# Patient Record
Sex: Male | Born: 1940
Health system: Southern US, Community
[De-identification: ages and names within clinical notes are randomized; demographics above are authoritative.]

## PROBLEM LIST (undated history)

## (undated) DIAGNOSIS — K219 Gastro-esophageal reflux disease without esophagitis: Secondary | ICD-10-CM

## (undated) DIAGNOSIS — C4491 Basal cell carcinoma of skin, unspecified: Secondary | ICD-10-CM

## (undated) DIAGNOSIS — I671 Cerebral aneurysm, nonruptured: Secondary | ICD-10-CM

## (undated) DIAGNOSIS — I4892 Unspecified atrial flutter: Secondary | ICD-10-CM

## (undated) DIAGNOSIS — I714 Abdominal aortic aneurysm, without rupture, unspecified: Secondary | ICD-10-CM

## (undated) DIAGNOSIS — G459 Transient cerebral ischemic attack, unspecified: Secondary | ICD-10-CM

## (undated) DIAGNOSIS — I639 Cerebral infarction, unspecified: Secondary | ICD-10-CM

## (undated) DIAGNOSIS — I499 Cardiac arrhythmia, unspecified: Secondary | ICD-10-CM

## (undated) DIAGNOSIS — R931 Abnormal findings on diagnostic imaging of heart and coronary circulation: Principal | ICD-10-CM

## (undated) DIAGNOSIS — Z87898 Personal history of other specified conditions: Secondary | ICD-10-CM

## (undated) DIAGNOSIS — K5900 Constipation, unspecified: Secondary | ICD-10-CM

## (undated) DIAGNOSIS — F32A Depression, unspecified: Secondary | ICD-10-CM

## (undated) DIAGNOSIS — Z9289 Personal history of other medical treatment: Secondary | ICD-10-CM

## (undated) DIAGNOSIS — N400 Enlarged prostate without lower urinary tract symptoms: Secondary | ICD-10-CM

## (undated) DIAGNOSIS — F329 Major depressive disorder, single episode, unspecified: Secondary | ICD-10-CM

## (undated) DIAGNOSIS — R350 Frequency of micturition: Secondary | ICD-10-CM

## (undated) DIAGNOSIS — R911 Solitary pulmonary nodule: Secondary | ICD-10-CM

## (undated) DIAGNOSIS — I82409 Acute embolism and thrombosis of unspecified deep veins of unspecified lower extremity: Secondary | ICD-10-CM

## (undated) DIAGNOSIS — M069 Rheumatoid arthritis, unspecified: Secondary | ICD-10-CM

## (undated) DIAGNOSIS — N200 Calculus of kidney: Secondary | ICD-10-CM

## (undated) HISTORY — DX: Abdominal aortic aneurysm, without rupture, unspecified: I71.40

## (undated) HISTORY — PX: JOINT REPLACEMENT: SHX530

## (undated) HISTORY — PX: BASAL CELL CARCINOMA EXCISION: SHX1214

## (undated) HISTORY — DX: Unspecified atrial flutter: I48.92

## (undated) HISTORY — DX: Transient cerebral ischemic attack, unspecified: G45.9

## (undated) HISTORY — DX: Acute embolism and thrombosis of unspecified deep veins of unspecified lower extremity: I82.409

## (undated) HISTORY — DX: Abnormal findings on diagnostic imaging of heart and coronary circulation: R93.1

## (undated) HISTORY — DX: Abdominal aortic aneurysm, without rupture: I71.4

## (undated) HISTORY — DX: Solitary pulmonary nodule: R91.1

## (undated) HISTORY — PX: EXCISIONAL HEMORRHOIDECTOMY: SHX1541

## (undated) HISTORY — PX: CYSTOSCOPY W/ STONE MANIPULATION: SHX1427

---

## 1992-09-25 DIAGNOSIS — I639 Cerebral infarction, unspecified: Secondary | ICD-10-CM

## 1992-09-25 DIAGNOSIS — I671 Cerebral aneurysm, nonruptured: Secondary | ICD-10-CM

## 1992-09-25 HISTORY — DX: Cerebral infarction, unspecified: I63.9

## 1992-09-25 HISTORY — DX: Cerebral aneurysm, nonruptured: I67.1

## 1992-09-25 HISTORY — PX: CEREBRAL ANEURYSM REPAIR: SHX164

## 1998-12-23 ENCOUNTER — Encounter: Payer: Self-pay | Admitting: Neurological Surgery

## 1998-12-23 ENCOUNTER — Ambulatory Visit (HOSPITAL_COMMUNITY): Admission: RE | Admit: 1998-12-23 | Discharge: 1998-12-23 | Payer: Self-pay | Admitting: Neurological Surgery

## 1999-09-26 DIAGNOSIS — I82409 Acute embolism and thrombosis of unspecified deep veins of unspecified lower extremity: Secondary | ICD-10-CM

## 1999-09-26 HISTORY — DX: Acute embolism and thrombosis of unspecified deep veins of unspecified lower extremity: I82.409

## 1999-10-13 ENCOUNTER — Ambulatory Visit (HOSPITAL_COMMUNITY): Admission: RE | Admit: 1999-10-13 | Discharge: 1999-10-13 | Payer: Self-pay | Admitting: Rheumatology

## 1999-10-13 ENCOUNTER — Inpatient Hospital Stay (HOSPITAL_COMMUNITY): Admission: EM | Admit: 1999-10-13 | Discharge: 1999-10-14 | Payer: Self-pay | Admitting: Emergency Medicine

## 2000-11-12 ENCOUNTER — Ambulatory Visit (HOSPITAL_COMMUNITY): Admission: RE | Admit: 2000-11-12 | Discharge: 2000-11-12 | Payer: Self-pay | Admitting: Rheumatology

## 2003-05-06 ENCOUNTER — Encounter: Admission: RE | Admit: 2003-05-06 | Discharge: 2003-05-06 | Payer: Self-pay | Admitting: Neurological Surgery

## 2003-05-06 ENCOUNTER — Encounter: Payer: Self-pay | Admitting: Neurological Surgery

## 2003-12-07 ENCOUNTER — Encounter: Admission: RE | Admit: 2003-12-07 | Discharge: 2003-12-07 | Payer: Self-pay | Admitting: Rheumatology

## 2003-12-15 ENCOUNTER — Inpatient Hospital Stay (HOSPITAL_COMMUNITY): Admission: EM | Admit: 2003-12-15 | Discharge: 2003-12-17 | Payer: Self-pay | Admitting: Emergency Medicine

## 2007-10-08 ENCOUNTER — Encounter: Payer: Self-pay | Admitting: Pulmonary Disease

## 2007-11-26 ENCOUNTER — Ambulatory Visit: Payer: Self-pay | Admitting: Vascular Surgery

## 2008-01-01 ENCOUNTER — Observation Stay (HOSPITAL_COMMUNITY): Admission: EM | Admit: 2008-01-01 | Discharge: 2008-01-02 | Payer: Self-pay | Admitting: Emergency Medicine

## 2008-06-16 ENCOUNTER — Ambulatory Visit: Payer: Self-pay | Admitting: Vascular Surgery

## 2008-06-16 ENCOUNTER — Encounter: Admission: RE | Admit: 2008-06-16 | Discharge: 2008-06-16 | Payer: Self-pay | Admitting: Vascular Surgery

## 2008-06-19 ENCOUNTER — Encounter: Admission: RE | Admit: 2008-06-19 | Discharge: 2008-06-19 | Payer: Self-pay | Admitting: Vascular Surgery

## 2008-07-31 ENCOUNTER — Ambulatory Visit: Payer: Self-pay | Admitting: Pulmonary Disease

## 2008-07-31 DIAGNOSIS — Z87442 Personal history of urinary calculi: Secondary | ICD-10-CM | POA: Insufficient documentation

## 2008-07-31 DIAGNOSIS — J984 Other disorders of lung: Secondary | ICD-10-CM

## 2008-07-31 DIAGNOSIS — Z87898 Personal history of other specified conditions: Secondary | ICD-10-CM | POA: Insufficient documentation

## 2008-07-31 DIAGNOSIS — M069 Rheumatoid arthritis, unspecified: Secondary | ICD-10-CM | POA: Insufficient documentation

## 2008-07-31 DIAGNOSIS — Z85828 Personal history of other malignant neoplasm of skin: Secondary | ICD-10-CM | POA: Insufficient documentation

## 2008-07-31 DIAGNOSIS — I714 Abdominal aortic aneurysm, without rupture: Secondary | ICD-10-CM | POA: Insufficient documentation

## 2008-08-12 ENCOUNTER — Encounter: Payer: Self-pay | Admitting: Pulmonary Disease

## 2008-11-30 ENCOUNTER — Telehealth: Payer: Self-pay | Admitting: Pulmonary Disease

## 2009-01-06 ENCOUNTER — Ambulatory Visit: Payer: Self-pay | Admitting: Cardiovascular Disease

## 2009-06-10 ENCOUNTER — Ambulatory Visit: Payer: Self-pay | Admitting: Vascular Surgery

## 2009-12-28 ENCOUNTER — Telehealth: Payer: Self-pay | Admitting: Pulmonary Disease

## 2010-01-05 ENCOUNTER — Ambulatory Visit: Payer: Self-pay | Admitting: Internal Medicine

## 2010-01-10 ENCOUNTER — Telehealth (INDEPENDENT_AMBULATORY_CARE_PROVIDER_SITE_OTHER): Payer: Self-pay | Admitting: *Deleted

## 2010-07-06 ENCOUNTER — Ambulatory Visit: Payer: Self-pay | Admitting: Vascular Surgery

## 2010-10-25 NOTE — Progress Notes (Signed)
Summary: ct  Phone Note Call from Patient Call back at Home Phone [phone] Call back at [phone]   Caller: Patient Call For: clance Reason for Call: Talk to Nurse Summary of Call: pt says he was suppose to have another CT scan.  Nothing in computer.  Can we set this up before he comes back in? Initial call taken by: Eugene Gavia,  December 28, 2009 11:52 AM  Follow-up for Phone Call        [facility], looks like ct chest is due this month.  Do you want contrast or no? Please advise and we can send order to [facility] thanks Vernie Murders  December 28, 2009 3:20 PM   Additional Follow-up for Phone Call Additional follow up Details #1::        pt does need a f/u ct chest.  will send order to [facility]

## 2010-10-25 NOTE — Progress Notes (Signed)
Summary: RESULTS  Phone Note Call from Patient Call back at Home Phone [phone]   Caller: Patient Call For: [facility] Summary of Call: PT STATES HE IS STILL WAITING ON CT RESULTS. CALL HOME # OR CELL [phone] Initial call taken by: Tivis Ringer, CNA,  January 10, 2010 9:55 AM  Follow-up for Phone Call        Per [facility], nodules stable x2 years and no further follow-up is needed at this time. The pt had verbal understanding of this. Follow-up by: Michel Bickers CMA,  January 10, 2010 10:01 AM

## 2011-02-07 NOTE — Procedures (Signed)
DUPLEX ULTRASOUND OF ABDOMINAL AORTA   INDICATION:  Followup abdominal aortic aneurysm.   HISTORY:  Diabetes:  No.  Cardiac:  No.  Hypertension:  No.  Smoking:  No.  Connective Tissue Disorder:  Family History:  Possibly father.  Previous Surgery:  Brain aneurysm repair.   DUPLEX EXAM:         AP (cm)                   TRANSVERSE (cm)  Proximal             1.76 cm                   1.63 cm  Mid                  2.98 cm                   3.03 cm  Distal               1.81 cm                   1.95 cm  Right Iliac          1.37 cm                   1.40 cm  Left Iliac           1.42 cm                   1.32 cm   PREVIOUS:  Date:  06/16/2008  (CT)  AP:  2.8  TRANSVERSE:  3.0   IMPRESSION:  Stable abdominal aortic aneurysm with largest measurement  of 2.98 cm x 3.03 cm.   ___________________________________________  Di Kindle. Edilia Bo, M.D.   AS/MEDQ  D:  06/10/2009  T:  06/10/2009  Job:  [id number]

## 2011-02-07 NOTE — H&P (Signed)
Jeffrey Stephens, Jeffrey Stephens               ACCOUNT NO.:  [id number]   MEDICAL RECORD NO.:  [id number]          PATIENT TYPE:  EMS   LOCATION:  [facility]                         FACILITY:  [facility]   PHYSICIAN:  Hollice Espy, M.D.DATE OF BIRTH:  [birth year]   DATE OF ADMISSION:  01/01/2008  DATE OF DISCHARGE:                              HISTORY & PHYSICAL   PCP:  Dr. Lennox Pippins.   CHIEF COMPLAINTS:  Abdominal pain.   HISTORY OF PRESENT ILLNESS:  The patient is a 70 year old white male  with a past medical history of rheumatoid arthritis and recently  discovered AAA in the last 5 months who comes in complaining of  abdominal pain.  It started today located in the periumbilical region  and severe.  Describes a stabbing pain with no specific radiation and no  other complaints.  The pain was non-relenting, so he came into the  emergency room for further evaluation.  Initially the concern was  obviously that his AAA was worsening or had ruptured, so he went for a  stat CT which was negative for any change in his AAA.  However, it did  note continued diverticular disease and also is noted an area of diffuse  small bowel thickening concerning for possibility of ulceration versus  inflammation possible small bowel enteritis.  With these findings the  patient has borderline white count and the fact that his symptoms were  still persisting despite pain and nausea medicine, felt best to come in  for further evaluation and treatment.  Currently he is doing well.  Complains of mild pain, but better after pain medicine.  He denies any  headaches, vision changes, no dysphagia.  No chest pain, palpitations,  shortness of breath, wheeze, cough, no hematuria, dysuria, constipation,  diarrhea noted, no blood in stool.  No dark black or tarry stool.  No  focal strain, numbness, weakness, or pain.   REVIEW OF SYSTEMS:  Otherwise negative.   PAST MEDICAL HISTORY:  Includes rheumatoid arthritis, recently  discovered AAA.   MEDICATIONS:  On methotrexate and daily aspirin.  He has no known drug  allergies.   SOCIAL HISTORY:  No tobacco, alcohol, or drug use.   FAMILY HISTORY:  Noncontributory.   PHYSICAL EXAMINATION:  The patient's vitals on admission, temperature  96.7, heart 88, blood pressure 140/87, respirations 24, O2 sat 96% on  room air.  In general he is alert and x3 in no apparent distress.  HEENT: Normocephalic, atraumatic.  Mucous membranes are moist are moist.  He has no carotid bruits.  HEART:  Regular rate and rhythm S1-S2.  LUNGS:  Decreased breath sounds throughout secondary to pain and body  habitus.  ABDOMEN:  Soft, obese.  Some mild tenderness in periumbilical region.  Hypoactive bowel sounds.  EXTREMITIES:  No clubbing, cyanosis or edema.   LABS:  White count 10.1, H&H 16.6 and 40, MCV of 94, platelet count  288,000, 81% neutrophils.  Lipase is 29, INR is 1.  LFTs completely  normal.  Coags are unremarkable.  Lactic acid level is pending.  Sodium  138, potassium 4.2, chloride 102, bicarb  not done, BUN 13 creatinine  1.2, glucose 108.   ASSESSMENT AND PLAN:  1. Abdominal pain.  Questionable area of small bowel thickening,      inflammation versus ulcer versus previously gastroenteritis.      Observe overnight with pain and nausea control.  2. History of rheumatoid arthritis.  Continue methotrexate.  3. History of abdominal aortic aneurysm.  Monitor blood pressure.      Hollice Espy, M.D.  Electronically Signed     SKK/MEDQ  D:  01/01/2008  T:  01/01/2008  Job:  [id number]   cc:   Demetria Pore. Coral Spikes, M.D.

## 2011-02-07 NOTE — Consult Note (Signed)
VASCULAR SURGERY CONSULTATION   WADDELL, ITEN  DOB:  1941/05/01                                       11/26/2007  [id number]#:[id number]   I saw Mr. Rocks in the office today in consultation concerning a 3.2  cm infrarenal abdominal aortic aneurysm.  He was referred by Dr.  Coral Spikes.  This is a pleasant 70 year old gentleman who had developed  some right flank pain approximately 2 months ago.  Initially it was felt  that he might have a kidney stone.  This ultimately prompted a CT scan  which did not show evidence of a kidney stone but an incidental finding  was a 3.2 cm infrarenal abdominal aortic aneurysm.  He does have  bilateral renal cysts.  He was also noted to have some slight prostatic  enlargement.  Based on the CT finding, he was sent for vascular  consultation.   PAST MEDICAL HISTORY:  Significant for:  1. History of a ruptured brain aneurysm in 1994 which was treated by      Dr. Danielle Dess.  2. He denies any history of diabetes, hypertension,      hypercholesterolemia, history of previous myocardial infarction,      history of congestive heart failure or history of emphysema.  3. He does have a history of a left leg DVT in 2001 and was treated at      that time with Coumadin.   PAST SURGICAL HISTORY:  Significant for:  1. Surgery on some skin cancers on his face by Dr. Yetta Barre in 2001.  2. He has had previous hemorrhoidectomy.  3. He has had previous kidney stone cystoscopy.  4. 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   SOCIAL HISTORY:  1. He is married, he has 3 children.  2. He quit tobacco in 1994.   FAMILY HISTORY:  1. He has a brother who had heart disease in his early 75s.  2. His father had a aneurysm around his heart.  3. He is unaware of any other history of premature cardiovascular      disease.   MEDICATIONS AND REVIEW OF SYSTEMS:  Are documented  on the medical  history form in his chart.   PHYSICAL EXAMINATION:  This is a pleasant 70 year old gentleman who  appears his stated age.  Blood pressure is 135/80, heart rate is 77.  There is no cervical lymphadenopathy.  I do not detect any carotid  bruits.  Lungs:  Clear bilaterally to auscultation.  Cardiac Exam:  He  has a regular rate and rhythm.  Abdomen:  Soft and nontender.  His  abdomen is fairly large and I cannot palpate his aneurysm.  He has  normal pitched bowel sounds.  He has normal femoral, popliteal and pedal  pulses.  He has no evidence of evidence of atheroembolic disease.  He  has no significant lower extremity swelling.  Neurologic Exam:  Nonfocal.   [facility] and I am unable to retrieve  this on the computer.  I do have the report and it was interpreted by  Dr. Liliane Shi.  The maximum diameter of his aneurysm is 3.2 cm.  There was  no evidence of leak.   I reassured Mr. Goldston that he  has a very small aneurysm.  He is fairly  concerned about it and rather than extend and follow this up in a year I  will see him back in 6 months.  He understands we would generally not  consider elective repair for the normal risk patient unless it reached  5.5 cm in maximum diameter.  His flank pain sounds like it is most  likely musculoskeletal in origin.  I did not think it was related to his  aneurysm.  I will see him back in 6 months.  He knows to call sooner if  he has problems.   Di Kindle. Edilia Bo, M.D.  Electronically Signed  CSD/MEDQ  D:  11/26/2007  T:  11/27/2007  Job:  [id number]   cc:   Demetria Pore. Coral Spikes, M.D.

## 2011-02-07 NOTE — Assessment & Plan Note (Signed)
OFFICE VISIT   Jeffrey Stephens, Jeffrey Stephens  DOB:  November 23, 1940                                       06/10/2009  [id number]#:[id number]   I saw the patient in the office today for continued followup of his  small abdominal aortic aneurysm.  When I last saw him in September of  2009 by my measurement and CT scan the aneurysm was 2.7 cm in maximum  diameter.  He comes in for a yearly followup visit.  Of note, he also  had some small lung nodules which Dr. Coral Spikes has followed.   With respect to his aneurysm he has had no abdominal or back pain except  for some chronic low back pain related to arthritis.   There has been no significant change in his medical history since his  last visit.   REVIEW OF SYSTEMS:  He has had no chest pain, chest pressure,  palpitations or arrhythmias.  He has had no productive cough,  bronchitis, asthma or wheezing.   PHYSICAL EXAMINATION:  This is a pleasant 70 year old gentleman who  appears his stated age.  His blood pressure is 119/76, heart rate is 61.  I do not detect any carotid bruits.  Lungs are clear bilaterally to  auscultation.  On cardiac exam he has a regular rate and rhythm.  His  abdomen is soft and nontender.  He has palpable femoral pulses and  palpable pedal pulses bilaterally.  He has no evidence of atheroembolic  disease.   Ultrasound in our office today shows that the maximum diameter of his  aneurysm is 3.03 cm.   There has been no significant change in the size of his aneurysm over  the last year.  He understands we would normally not consider elective  repair in a normal risk patient unless the aneurysm reached 5.5 cm in  maximum diameter.  He is obviously a good ways away from that.  In  addition the aneurysm has remained stable in size.  We will continue his  followup at 1 year and I will see him back in 1 year with a followup  ultrasound.  He knows to call sooner if he has problems.   Di Kindle. Edilia Bo,  M.D.  Electronically Signed   CSD/MEDQ  D:  06/10/2009  T:  06/11/2009  Job:  [id number]

## 2011-02-07 NOTE — Assessment & Plan Note (Signed)
OFFICE VISIT   Jeffrey Stephens, Jeffrey Stephens  DOB:  08/26/1941                                       07/06/2010  OZHYQ#:[id number]   I saw patient in the office today for continued follow-up of his small  abdominal aortic aneurysm.  I last saw him in September 2010.  At that  time the aneurysm measured 3 cm in maximum diameter.  He comes in for a  yearly follow-up visit.  Since I saw him last, he has had no significant  abdominal or back pain.   His past medical history is fairly unremarkable except for some  arthritis and a previous history of an intracranial aneurysm.   SOCIAL HISTORY:  He is married.  He quit tobacco in 1994.  His wife  recently quit tobacco also.   REVIEW OF SYSTEMS:  CARDIOVASCULAR:  He has had no chest pain, chest  pressure, palpitations, or arrhythmias.  He has some mild dyspnea on  exertion.  He has had no history of DVT or phlebitis.  PULMONARY:  He has had no productive cough, bronchitis, asthma, or  wheezing.   PHYSICAL EXAMINATION:  This is a pleasant 70 year old gentleman who  appears his stated age.  Blood pressure is 116/73, heart rate is 72,  saturation 98%.  Lungs are clear bilaterally to auscultation without  rales, rhonchi, or wheezing.  On cardiovascular exam, I do not detect  any carotid bruits.  He has a regular rate and rhythm.  He has palpable  femoral, popliteal, dorsalis pedis, and posterior tibial pulses  bilaterally.  He has no significant lower extremity swelling.  Abdomen  is soft and nontender.  Because of his size, I cannot palpate his  aneurysm.  He has normal-pitched bowel sounds.  Neurologic:  He has no  focal weakness or paresthesias.   I did independently interpret his duplex of his aorta, which shows a  maximum diameter of 3 cm and has not changed over the last year.   We discussed potentially stretching his follow-up out to 18 months.  However, given the problems he had with an intracranial aneurysm, he is  very reluctant to do this and therefore will keep his follow-up at 1  year.  I have ordered an ultrasound of the aneurysm in 1 year and will  see him back at that time.  He knows to call sooner if he has problems.     Di Kindle. Edilia Bo, M.D.  Electronically Signed   CSD/MEDQ  D:  07/06/2010  T:  07/07/2010  Job:  [id number]   cc:   Thora Lance, M.D.

## 2011-02-07 NOTE — Procedures (Signed)
DUPLEX ULTRASOUND OF ABDOMINAL AORTA   INDICATION:  Follow up AAA.   HISTORY:  Diabetes:  No.  Cardiac:  No.  Hypertension:  No.  Smoking:  No.  Connective Tissue Disorder:  Family History:  Possibly father.  Previous Surgery:  Brain aneurysm repair.   DUPLEX EXAM:         AP (cm)                   TRANSVERSE (cm)  Proximal             1.84 cm                   2.26 cm  Mid                  2.88 cm                   3.00 cm  Distal               1.84 cm                   1.93 cm  Right Iliac          1.23 cm  Left Iliac           1.27 cm   PREVIOUS:  Date: 06/10/2009  AP:  2.98  TRANSVERSE:  3.03   IMPRESSION:  1. There is an increase in the proximal aorta measurement, as compared      to the previous study.  2. Mid and distal aorta measurements remain stable from previous      study.  3. Largest measurement today, 2.88 cm X 3.00 cm.   ___________________________________________  Di Kindle. Edilia Bo, M.D.   EM/MEDQ  D:  07/06/2010  T:  07/06/2010  Job:  [id number]

## 2011-02-07 NOTE — Assessment & Plan Note (Signed)
OFFICE VISIT   KOVEN, BELINSKY  DOB:  November 16, 1940                                       06/16/2008  UYQIH#:[id number]   I saw the patient in the office today for continued followup of his  small infrarenal abdominal aortic aneurysm.  I had originally seen him  in consultation in March of this year with a 3.2 cm infrarenal abdominal  aortic aneurysm.  Since I saw him last in March he has had no history of  significant abdominal or back pain.   PAST MEDICAL HISTORY:  Significant for a ruptured brain aneurysm in  1994.  He has also had a previous left leg DVT in 2001.  He denies any  history of diabetes, hypertension, hypercholesterolemia or history of  significant cardiac disease.   SOCIAL HISTORY:  He quit tobacco in 1994.   REVIEW OF SYSTEMS:  He has had no recent chest pain, chest pressure,  palpitations or arrhythmias.  He has had no bronchitis, asthma, wheezing  or hemoptysis.  He has occasional nocturia.   PHYSICAL EXAMINATION:  General:  This is a pleasant 70 year old  gentleman who appears his stated age.  Vital signs:  Blood pressure  130/75, heart rate is 80.  Neck:  I do not detect any carotid bruits.  Lungs:  Are clear bilaterally to auscultation.  Cardiac:  He has a  regular rate and rhythm.  Abdomen:  I cannot palpate his aneurysm  because of his size.  However, his abdomen is nontender and he has  normal pitched bowel sounds.  He has palpable femoral, popliteal and  pedal pulses.  I see no evidence of atheroembolic disease.   He did have a CT scan of the chest, abdomen and pelvis.  The patient was  noted to have some persistent nodules in the right lower lobe.  Some of  the nodules had actually decreased in size and this would be consistent  with inflammatory nodules which were resolving.  However, a followup CT  scan was recommended as this CT was somewhat limited they had  recommended a dedicated chest CT or PET scan.  Of note, the  abdominal  aortic aneurysm had not changed in size and by my measurement was about  2.7 cm in maximum diameter.   With respect to his aneurysm I think this is quite small and I think he  simply needs a followup ultrasound in 1 year.  We will be sure that Dr.  Katina Degree office gets a copy of the CT scan report and when he sees Dr.  Coral Spikes next it would probably be best for him to arrange his followup  of his chest nodules as I explained that I am not a thoracic surgeon.  However, I also explained that if it would be easier for the patient we  would be happy to schedule the dedicated chest CT.  The patient will  think about this and let us know what he decides to do.  Otherwise, I  will plan on seeing him back in 1 year with a followup ultrasound of his  abdominal aortic aneurysm.  He understands we generally would not  consider elective repair unless it reached 5.5 cm in maximum diameter.   Di Kindle. Edilia Bo, M.D.  Electronically Signed   CSD/MEDQ  D:  06/23/2008  T:  06/24/2008  Job:  [id number] 
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
## 2011-02-10 NOTE — Discharge Summary (Signed)
Jeffrey Stephens, Jeffrey Stephens                         ACCOUNT NO.:  [id number]   MEDICAL RECORD NO.:  [id number]                   PATIENT TYPE:  INP   LOCATION:  5031                                 FACILITY:  [facility]   PHYSICIAN:  Lilla Shook, M.D.            DATE OF BIRTH:  11-01-1940   DATE OF ADMISSION:  12/15/2003  DATE OF DISCHARGE:  12/17/2003                                 DISCHARGE SUMMARY   ADMISSION DIAGNOSES:  1. Subacute febrile illness with suspicion of influenza.  2. Rheumatoid arthritis.   DISCHARGE DIAGNOSES:  1. Noninfluenza, probable viral syndrome.  2. Bronchitis.  3. Rheumatoid arthritis.  4. Chronic steroid dependence and also on methotrexate which was held three     days ago.   DISCHARGE MEDICATIONS:  1. Prednisone taper as follows:  Prednisone 60 mg daily for two days, then     50 mg daily for 2 days, then 40 mg daily for two days, then 30 mg daily     for two days, then 20 mg daily for two days, then  10 mg daily for two days, then 5 mg daily thereafter.  1. Levaquin 500 mg daily times 7 days.  2. Methotrexate 10 mg q Monday as before.  3. Humibid LA 600 mg q 12 hours for cough p.r.n.  4. Folic acid 1 mg daily.  5. Aspirin 81 mg daily.   DISCHARGE INSTRUCTIONS:  He was to follow up with Dr. Coral Spikes one week after  discharge.  He would follow a low fat diet and call or come to the emergency  room with more shortness of breath, fevers, chills, etc.   CHIEF COMPLAINT:  Headache, fatigue and runny nose.   HISTORY OF PRESENT ILLNESS:  Jeffrey Stephens is a pleasant 70 year old man who  is a patient of Dr. Lennox Pippins.  He is seen for rheumatoid arthritis.  Normally, he was in great health until 3 days prior to admission.  He  developed rhinorrhea, headache and fatigue.  He was told not to take his  methotrexate this past Monday to help with his symptoms.  He then developed  pleuritic left anterior chest wall pain which was burning in nature with  initially a dry cough.  He had a fever of 102 at the highest.  Decrease in  appetite.  No dysuria or other complaints.   PHYSICAL EXAMINATION:  VITAL SIGNS: Temperature maximum is 101.6, blood  pressure 138/72, pulse oximetry 94% on oxygen.  LUNGS: Rales at the left base.  HEART: He had distant heart sounds but normal S1 and S2.  GU:  His prostate was felt to be boggy and painful.   LABORATORY DATA:  He had an EKG with a left posterior fascicular block.  Chest x-ray shows left lower lobe atelectasis. White blood cell count  10,200, hemoglobin 17.1, neutrophils 87%.  Urine and blood cultures are  pending.   HOSPITAL COURSE:  Mr. Magallon  was admitted for an acute febrile illness  which may have been the flu.  He did have basilar aspirate for influenzae  which were negative.  He also was treated for prostatitis given the boggy  and painful perception of his prostate on admission.  However, urine  cultures and urinalysis were both negative.  Mr. Harshberger, himself, also  stated that he had had no problems regarding his prostate and perhaps only  had a slow urinary flow.  He was then switched to Levaquin from  ciprofloxacin for therapy. He had a repeat chest x-ray the morning after  admission which showed improvement in his left lower lobe atelectasis.  This  probably was fluid and stress of the steroids. He was given  Solu-Cortef at 100 mg t.i.d.  stress dose for occasional chronic prednisone  therapy.  He began to cough up greenish thick sputum and was given a cough  expectorant.  His appetite returned and he was eating regular food by the  time of discharge and ambulated around the room and generally was doing  better.  The plan was to taper his steroids as an outpatient slowly and  continue the antibiotics and cough medicines.   CONDITION ON DISCHARGE:  Stable and much improved.   CONSULTATIONS:  None.                                                Lilla Shook, M.D.     SEJ/MEDQ  D:  12/17/2003  T:  12/19/2003  Job:  [id number]   cc:   Demetria Pore. Coral Spikes, M.D.  [address]  [address]  [place]  [place] [address]  Fax: [phone]

## 2011-02-10 NOTE — H&P (Signed)
NAME:  Jeffrey Stephens, HIGINBOTHAM                         ACCOUNT NO.:  [id number]   MEDICAL RECORD NO.:  [id number]                   PATIENT TYPE:  INP   LOCATION:  1831                                 FACILITY:  [facility]   PHYSICIAN:  Hal T. Stoneking, M.D.              DATE OF BIRTH:  1941/09/12   DATE OF ADMISSION:  12/15/2003  DATE OF DISCHARGE:                                HISTORY & PHYSICAL   IDENTIFYING DATA:  Mr. Jeffrey Stephens is a very nice 70 year old white male  followed by Dr. Demetria Pore. Levitin for rheumatoid arthritis.  He basically is  an independent person who enjoys excellent health.  Approximately three days  ago, he developed rhinorrhea, headache and fatigue.  He was told yesterday  not to take his methotrexate.  Then today, he developed pleuritic left  anterior chest wall pain that was burning in nature.  He really denied any  cough.  He also noted a fever today as high as 102 with chills.  He has had  really no appetite and has had some dry  heaves but no true vomiting or  diarrhea.  He does complain of headache.  He denies any dysuria and  complains of generalized myalgias.   ALLERGIES:  No known drug allergies.   CURRENT MEDICATIONS:  1. Methotrexate 10 mg every Monday.  2. Prednisone 5 mg a day.  3. Folic acid 1 mg a day.  4. Aspirin 81 mg a day.   PAST MEDICAL HISTORY:  1. Rheumatoid arthritis.  2. Status post stroke due to a posterior aneurysm rupture in 1994.  No     residual neurologic deficit.  3. No history of diabetes, hypertension, or coronary artery disease.   PAST SURGICAL HISTORY:  1. Craniotomy and clipping of an aneurysm in 1994.  2. Hemorrhoid surgery.   FAMILY HISTORY:  This is remarkable for coronary artery disease in his  father, brother and mother.   SOCIAL HISTORY:  He is married.  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  He stopped smoking in 1994, does not  consume alcohol, has three grown children.   REVIEW OF SYMPTOMS:  Unremarkable other than that mentioned in the HPI.   PHYSICAL EXAMINATION:  VITAL SIGNS:  Temperature 101.6, blood pressure  138/72, O2 saturation 94%.  SKIN:  Normal.  HEENT:  Pupils are equal, round and reactive to light.  Discs are sharp.  TMs normal.  Oropharynx clear.  LUNGS:  There are a few crackles at the left base.  CARDIOVASCULAR:  There is distant S1 and S2.  No murmur heard.  ABDOMEN:  Obese, soft, nontender.  RECTAL:  Examination revealed a very tender, boggy prostate.  NEUROLOGIC:  Normal.   EKG revealed a left posterior fascicular block.   Chest x-ray revealed left lower lobe atelectasis.   LABORATORY DATA:  White count of 10,200, hemoglobin 17.1, platelet count  211,000, 87%  neutrophils, 6% lymphs, 7% monos.  Sodium 135, potassium 4.1,  chloride 104, bicarb 22, glucose 116, BUN 7, creatinine 1.0.  Calcium 9.0,  total protein 7.5, albumin 3.9, SGOT 22, SGPT 21, alkaline phosphatase 69,  total bilirubin 0.8.  CK 31 with an MB of 0.6 troponin 0.02.   ASSESSMENT:  1. Subacute febrile illness with myalgias, headache, and rhinorrhea.     Clinical presentation most consistent with influenza, although he does     state he had a flu shot this year.  Also, another viral process is a     possibility.  Also, his prostate is very tender.  I wonder about     prostatitis.  Another possibility would be a very early pneumonia,     although he is really not coughing very much.  He does have pleuritic     chest pain.  2. Rheumatoid arthritis.  He did not take his methotrexate this past Monday.     It has been over a week since he has had it and I doubt this is related     to his current process.  He is somewhat adrenally suppressed secondary to     his chronic prednisone use.   PLAN:  Will admit.  Will check a urinalysis.  Will swab for influenza.  If  the above suggests prostatitis, then will start Cipro.  He will need repeat  PA and lateral chest x-ray in the  morning to see if a pneumonia is  developing.  Will cover him with stress Solu-Cortef level and will treat him  with Toradol for the chest discomfort.                                                Hal T. Pete Glatter, M.D.    HTS/MEDQ  D:  12/15/2003  T:  12/16/2003  Job:  [id number]

## 2011-06-20 LAB — CROSSMATCH: ABO/RH(D): B POS

## 2011-06-20 LAB — DIFFERENTIAL
Basophils Relative: 0
Basophils Relative: 0
Eosinophils Absolute: 0.4
Eosinophils Relative: 5
Lymphocytes Relative: 10 — ABNORMAL LOW
Lymphocytes Relative: 17
Lymphs Abs: 1.5
Monocytes Absolute: 0.6
Monocytes Absolute: 0.7
Monocytes Relative: 6
Monocytes Relative: 8
Neutro Abs: 6.2
Neutrophils Relative %: 70

## 2011-06-20 LAB — POCT CARDIAC MARKERS
CKMB, poc: <1 — ABNORMAL LOW
Myoglobin, poc: 72.2
Myoglobin, poc: 89
Operator id: 294521
Operator id: 294521
Troponin i, poc: <0.05
Troponin i, poc: <0.05

## 2011-06-20 LAB — POCT I-STAT, CHEM 8
BUN: 13
Calcium, Ion: 1.21
Chloride: 102
HCT: 50
Hemoglobin: 17
TCO2: 28

## 2011-06-20 LAB — HEPATIC FUNCTION PANEL
ALT: 14
AST: 22
Alkaline Phosphatase: 64
Bilirubin, Direct: 0.1
Total Bilirubin: 0.7

## 2011-06-20 LAB — CBC
Hemoglobin: 14
MCV: 93.5
Platelets: 288
RDW: 13.5

## 2011-06-20 LAB — PROTIME-INR: INR: 1

## 2011-06-20 LAB — LIPASE, BLOOD: Lipase: 29

## 2011-06-20 LAB — APTT: aPTT: 34

## 2011-07-05 ENCOUNTER — Encounter (INDEPENDENT_AMBULATORY_CARE_PROVIDER_SITE_OTHER): Payer: Medicare Other | Admitting: *Deleted

## 2011-07-05 DIAGNOSIS — I714 Abdominal aortic aneurysm, without rupture, unspecified: Secondary | ICD-10-CM

## 2011-07-12 ENCOUNTER — Encounter: Payer: Self-pay | Admitting: Vascular Surgery

## 2011-07-12 NOTE — Procedures (Unsigned)
DUPLEX ULTRASOUND OF ABDOMINAL AORTA  INDICATION:  Follow up AAA.  HISTORY: Diabetes:  No. Cardiac:  No. Hypertension:  No. Smoking:  No. Connective Tissue Disorder: Family History: Previous Surgery:  Brain aneurysm repair.  DUPLEX EXAM:         AP (cm)                   TRANSVERSE (cm) Proximal             2.06 cm Mid                  3.02 cm                   2.96 cm Distal               1.68 cm                   1.46 cm Right Iliac          1.04 cm Left Iliac           0.95 cm  PREVIOUS:  Date: 07/06/10  AP:  2.88  TRANSVERSE:  3.0  IMPRESSION:  Stable abdominal aortic aneurysm measuring approximately 3.02 cm X 2.96 cm.  ___________________________________________ Di Kindle. Edilia Bo, M.D.  LT/MEDQ  D:  07/06/2011  T:  07/06/2011  Job:  [id number]

## 2011-07-18 ENCOUNTER — Other Ambulatory Visit: Payer: Self-pay | Admitting: Vascular Surgery

## 2011-07-18 DIAGNOSIS — I714 Abdominal aortic aneurysm, without rupture: Secondary | ICD-10-CM

## 2011-12-13 ENCOUNTER — Other Ambulatory Visit: Payer: Self-pay

## 2012-01-25 ENCOUNTER — Other Ambulatory Visit: Payer: Self-pay | Admitting: Internal Medicine

## 2012-01-25 DIAGNOSIS — Z129 Encounter for screening for malignant neoplasm, site unspecified: Secondary | ICD-10-CM

## 2012-01-30 ENCOUNTER — Ambulatory Visit
Admission: RE | Admit: 2012-01-30 | Discharge: 2012-01-30 | Disposition: A | Discharge status: Home / Self Care | Payer: Medicare Other | Source: Ambulatory Visit | Attending: Internal Medicine | Admitting: Internal Medicine

## 2012-01-30 DIAGNOSIS — Z129 Encounter for screening for malignant neoplasm, site unspecified: Secondary | ICD-10-CM

## 2012-02-07 DIAGNOSIS — G40309 Generalized idiopathic epilepsy and epileptic syndromes, not intractable, without status epilepticus: Secondary | ICD-10-CM

## 2012-02-07 DIAGNOSIS — M311 Thrombotic microangiopathy, unspecified: Secondary | ICD-10-CM

## 2012-02-07 DIAGNOSIS — I2510 Atherosclerotic heart disease of native coronary artery without angina pectoris: Secondary | ICD-10-CM

## 2012-02-07 DIAGNOSIS — K922 Gastrointestinal hemorrhage, unspecified: Secondary | ICD-10-CM

## 2012-07-04 ENCOUNTER — Other Ambulatory Visit: Payer: Self-pay | Admitting: Internal Medicine

## 2012-07-04 DIAGNOSIS — R918 Other nonspecific abnormal finding of lung field: Secondary | ICD-10-CM

## 2012-07-25 ENCOUNTER — Other Ambulatory Visit: Payer: Self-pay | Admitting: Dermatology

## 2012-11-04 ENCOUNTER — Ambulatory Visit
Admission: RE | Admit: 2012-11-04 | Discharge: 2012-11-04 | Disposition: A | Discharge status: Home / Self Care | Payer: Medicare Other | Source: Ambulatory Visit | Attending: Internal Medicine | Admitting: Internal Medicine

## 2012-11-04 DIAGNOSIS — R918 Other nonspecific abnormal finding of lung field: Secondary | ICD-10-CM

## 2013-01-01 ENCOUNTER — Other Ambulatory Visit: Payer: Self-pay

## 2013-01-01 DIAGNOSIS — I714 Abdominal aortic aneurysm, without rupture: Secondary | ICD-10-CM

## 2013-01-24 ENCOUNTER — Encounter: Payer: Self-pay | Admitting: Vascular Surgery

## 2013-02-04 ENCOUNTER — Encounter: Payer: Self-pay | Admitting: Vascular Surgery

## 2013-02-05 ENCOUNTER — Encounter (INDEPENDENT_AMBULATORY_CARE_PROVIDER_SITE_OTHER): Payer: Medicare Other | Admitting: *Deleted

## 2013-02-05 ENCOUNTER — Encounter: Payer: Self-pay | Admitting: Vascular Surgery

## 2013-02-05 ENCOUNTER — Other Ambulatory Visit: Payer: Self-pay | Admitting: *Deleted

## 2013-02-05 ENCOUNTER — Ambulatory Visit (INDEPENDENT_AMBULATORY_CARE_PROVIDER_SITE_OTHER): Payer: Medicare Other | Admitting: Vascular Surgery

## 2013-02-05 VITALS — BP 118/66 | HR 69 | Ht 69.0 in | Wt 234.0 lb

## 2013-02-05 DIAGNOSIS — I714 Abdominal aortic aneurysm, without rupture: Secondary | ICD-10-CM

## 2013-02-05 NOTE — Progress Notes (Signed)
[facility]   Patient name: Jeffrey Stephens MRN: [id number] DOB: 1941/07/19 Sex: male  REASON FOR VISIT: follow up of abdominal aortic aneurysm  HPI: Jeffrey Stephens is a 72 y.o. male who I have been following with a small abdominal aortic aneurysm. Since I saw him last 18 months ago, he denies any significant abdominal pain. He does have some chronic low back pain related to arthritis. There have been no significant changes in his medical history.  Past Medical History  Diagnosis Date  . AAA (abdominal aortic aneurysm)   . TIA (transient ischemic attack)   . Arrhythmia   . DVT (deep venous thrombosis) 2001    Hx of Left leg   . Cancer     Skin ca.     Family History  Problem Relation Age of Onset  . Heart disease Father     Aneurysm  . Diabetes Father   . Heart attack Father   . Heart disease Brother     Heart Disease before age 26  . Cancer Brother     SOCIAL HISTORY: History  Substance Use Topics  . Smoking status: Former Smoker    Quit date: 09/25/1992  . Smokeless tobacco: Never Used  . Alcohol Use: No    No Known Allergies  Current Outpatient Prescriptions  Medication Sig Dispense Refill  . aspirin 81 MG tablet Take 81 mg by mouth daily.      . folic acid (FOLVITE) 1 MG tablet Take 1 mg by mouth daily.      . methotrexate (RHEUMATREX) 10 MG tablet Take 10 mg by mouth once a week. Caution: Chemotherapy. Protect from light.      . Multiple Vitamin (MULTIVITAMIN) tablet Take 1 tablet by mouth daily.      . Tamsulosin HCl (FLOMAX PO) Take by mouth.      . warfarin (COUMADIN) 10 MG tablet Take 10 mg by mouth daily.       No current facility-administered medications for this visit.    REVIEW OF SYSTEMS: Arly.Keller ] denotes positive finding; [  ] denotes negative finding  CARDIOVASCULAR:  [ ]  chest pain   [ ]  chest pressure   [ ]  palpitations   [ ]  orthopnea   [ ]  dyspnea on exertion   [ ]  claudication   [ ]  rest pain   [ ]  DVT   [ ]   phlebitis PULMONARY:   [ ]  productive cough   [ ]  asthma   [ ]  wheezing NEUROLOGIC:   [ ]  weakness  [ ]  paresthesias  [ ]  aphasia  [ ]  amaurosis  [ ]  dizziness HEMATOLOGIC:   [ ]  bleeding problems   [ ]  clotting disorders MUSCULOSKELETAL:  [ ]  joint pain   [ ]  joint swelling [ ]  leg swelling GASTROINTESTINAL: [ ]   blood in stool  [ ]   hematemesis GENITOURINARY:  [ ]   dysuria  [ ]   hematuria PSYCHIATRIC:  [ ]  history of major depression INTEGUMENTARY:  [ ]  rashes  [ ]  ulcers CONSTITUTIONAL:  [ ]  fever   [ ]  chills  PHYSICAL EXAM: Filed Vitals:   02/05/13 0854  BP: 118/66  Pulse: 69  Height: 5\' 9"  (1.753 m)  Weight: 234 lb (106.142 kg)  SpO2: 96%   Body mass index is 34.54 kg/(m^2). GENERAL: The patient is a well-nourished male, in no acute distress. The vital signs are documented above. CARDIOVASCULAR: There is a regular rate and rhythm. I do not detect  carotid bruits. He has palpable femoral pulses. PULMONARY: There is good air exchange bilaterally without wheezing or rales. ABDOMEN: Soft and non-tender with normal pitched bowel sounds. I am unable to palpate a small aneurysm. MUSCULOSKELETAL: There are no major deformities or cyanosis. NEUROLOGIC: No focal weakness or paresthesias are detected. SKIN: There are no ulcers or rashes noted. PSYCHIATRIC: The patient has a normal affect.  DATA:  I have independently interpreted his duplex of his aneurysm which shows that the maximum diameter is 3.4 cm. This is increased slightly from 3 cm 18 months ago.  MEDICAL ISSUES:  ABDOMINAL AORTIC ANEURYSM His aneurysm is 3.4 cm in maximum diameter and has enlarged only slightly. He understands we would not consider elective repair unless the aneurysm enlarged to 5.5 cm. I have ordered a follow up ultrasound in one year and I will see him back at that time. He knows to call sooner if he has problems. He is not a smoker and his blood pressure and cholesterol are followed closely by his primary  care physician Dr. Kirby Funk.   Garald Rhew S [facility] [place] Beeper: [phone]

## 2013-02-05 NOTE — Assessment & Plan Note (Signed)
His aneurysm is 3.4 cm in maximum diameter and has enlarged only slightly. He understands we would not consider elective repair unless the aneurysm enlarged to 5.5 cm. I have ordered a follow up ultrasound in one year and I will see him back at that time. He knows to call sooner if he has problems. He is not a smoker and his blood pressure and cholesterol are followed closely by his primary care physician Dr. Kirby Funk.

## 2013-02-07 ENCOUNTER — Other Ambulatory Visit: Payer: Self-pay | Admitting: *Deleted

## 2013-02-07 DIAGNOSIS — I714 Abdominal aortic aneurysm, without rupture: Secondary | ICD-10-CM

## 2013-08-29 ENCOUNTER — Other Ambulatory Visit: Payer: Self-pay

## 2013-08-29 ENCOUNTER — Emergency Department (HOSPITAL_COMMUNITY)
Admission: EM | Admit: 2013-08-29 | Discharge: 2013-08-29 | Disposition: A | Discharge status: Home / Self Care | Payer: Medicare Other | Attending: Emergency Medicine | Admitting: Emergency Medicine

## 2013-08-29 ENCOUNTER — Emergency Department (INDEPENDENT_AMBULATORY_CARE_PROVIDER_SITE_OTHER)
Admission: EM | Admit: 2013-08-29 | Discharge: 2013-08-29 | Disposition: A | Discharge status: Nursing Home (Includes ICF) | Payer: Medicare Other | Source: Home / Self Care | Attending: Family Medicine | Admitting: Family Medicine

## 2013-08-29 ENCOUNTER — Encounter (HOSPITAL_COMMUNITY): Payer: Self-pay | Admitting: Emergency Medicine

## 2013-08-29 ENCOUNTER — Emergency Department (HOSPITAL_COMMUNITY): Payer: Medicare Other

## 2013-08-29 DIAGNOSIS — K2970 Gastritis, unspecified, without bleeding: Secondary | ICD-10-CM | POA: Insufficient documentation

## 2013-08-29 DIAGNOSIS — I714 Abdominal aortic aneurysm, without rupture: Secondary | ICD-10-CM

## 2013-08-29 DIAGNOSIS — Z85828 Personal history of other malignant neoplasm of skin: Secondary | ICD-10-CM | POA: Insufficient documentation

## 2013-08-29 DIAGNOSIS — Z7982 Long term (current) use of aspirin: Secondary | ICD-10-CM | POA: Insufficient documentation

## 2013-08-29 DIAGNOSIS — R109 Unspecified abdominal pain: Secondary | ICD-10-CM

## 2013-08-29 DIAGNOSIS — Z8673 Personal history of transient ischemic attack (TIA), and cerebral infarction without residual deficits: Secondary | ICD-10-CM | POA: Insufficient documentation

## 2013-08-29 DIAGNOSIS — Z87891 Personal history of nicotine dependence: Secondary | ICD-10-CM | POA: Insufficient documentation

## 2013-08-29 DIAGNOSIS — Z8679 Personal history of other diseases of the circulatory system: Secondary | ICD-10-CM | POA: Insufficient documentation

## 2013-08-29 DIAGNOSIS — R61 Generalized hyperhidrosis: Secondary | ICD-10-CM | POA: Insufficient documentation

## 2013-08-29 DIAGNOSIS — Z86718 Personal history of other venous thrombosis and embolism: Secondary | ICD-10-CM | POA: Insufficient documentation

## 2013-08-29 DIAGNOSIS — Z79899 Other long term (current) drug therapy: Secondary | ICD-10-CM | POA: Insufficient documentation

## 2013-08-29 LAB — CBC WITH DIFFERENTIAL/PLATELET
Basophils Absolute: 0 10*3/uL (ref 0.0–0.1)
Eosinophils Relative: 4 % (ref 0–5)
HCT: 43.3 % (ref 39.0–52.0)
Lymphocytes Relative: 17 % (ref 12–46)
Lymphs Abs: 1.5 10*3/uL (ref 0.7–4.0)
MCV: 92.7 fL (ref 78.0–100.0)
Monocytes Absolute: 0.7 10*3/uL (ref 0.1–1.0)
Neutro Abs: 6 10*3/uL (ref 1.7–7.7)
RBC: 4.67 MIL/uL (ref 4.22–5.81)
RDW: 13.6 % (ref 11.5–15.5)
WBC: 8.5 10*3/uL (ref 4.0–10.5)

## 2013-08-29 LAB — COMPREHENSIVE METABOLIC PANEL
ALT: 16 U/L (ref 0–53)
AST: 21 U/L (ref 0–37)
CO2: 23 mEq/L (ref 19–32)
Calcium: 8.9 mg/dL (ref 8.4–10.5)
Chloride: 102 mEq/L (ref 96–112)
Creatinine, Ser: 0.96 mg/dL (ref 0.50–1.35)
GFR calc Af Amer: >90 mL/min (ref >90)
GFR calc non Af Amer: 81 mL/min — ABNORMAL LOW (ref >90)
Glucose, Bld: 117 mg/dL — ABNORMAL HIGH (ref 70–99)
Sodium: 137 mEq/L (ref 135–145)
Total Bilirubin: 0.4 mg/dL (ref 0.3–1.2)

## 2013-08-29 LAB — URINALYSIS, ROUTINE W REFLEX MICROSCOPIC
Glucose, UA: NEGATIVE mg/dL
Hgb urine dipstick: NEGATIVE
Leukocytes, UA: NEGATIVE
pH: 6 (ref 5.0–8.0)

## 2013-08-29 MED ORDER — SODIUM CHLORIDE 0.9 % IV SOLN: Freq: Once | INTRAVENOUS | Status: DC

## 2013-08-29 MED ORDER — PANTOPRAZOLE SODIUM 40 MG IV SOLR
40.0000 mg | Freq: Once | INTRAVENOUS | Status: AC
  Administered 2013-08-29: 40 mg via INTRAVENOUS
  Filled 2013-08-29: qty 40

## 2013-08-29 MED ORDER — ONDANSETRON HCL 4 MG/2ML IJ SOLN
4.0000 mg | Freq: Once | INTRAMUSCULAR | Status: AC
  Administered 2013-08-29: 4 mg via INTRAVENOUS
  Filled 2013-08-29: qty 2

## 2013-08-29 MED ORDER — GI COCKTAIL ~~LOC~~
30.0000 mL | Freq: Once | ORAL | Status: AC
  Administered 2013-08-29: 30 mL via ORAL
  Filled 2013-08-29: qty 30

## 2013-08-29 MED ORDER — IOHEXOL 350 MG/ML SOLN
100.0000 mL | Freq: Once | INTRAVENOUS | Status: AC | PRN
  Administered 2013-08-29: 100 mL via INTRAVENOUS

## 2013-08-29 MED ORDER — HYDROMORPHONE HCL PF 1 MG/ML IJ SOLN
0.5000 mg | Freq: Once | INTRAMUSCULAR | Status: AC
  Administered 2013-08-29: 0.5 mg via INTRAVENOUS
  Filled 2013-08-29: qty 1

## 2013-08-29 MED ORDER — HYDROMORPHONE HCL PF 1 MG/ML IJ SOLN
1.0000 mg | Freq: Once | INTRAMUSCULAR | Status: AC
  Administered 2013-08-29: 1 mg via INTRAVENOUS
  Filled 2013-08-29: qty 1

## 2013-08-29 MED ORDER — OXYCODONE-ACETAMINOPHEN 5-325 MG PO TABS: 1.0000 | ORAL_TABLET | Freq: Four times a day (QID) | ORAL | Status: DC | PRN

## 2013-08-29 MED ORDER — ONDANSETRON HCL 4 MG PO TABS: 4.0000 mg | ORAL_TABLET | Freq: Four times a day (QID) | ORAL | Status: DC

## 2013-08-29 NOTE — ED Notes (Signed)
Pt from urgent care with abdominal pain.  Pt with hx of abdominal aneurysm for several years.  Pt sts that pain has gotten worse in the past couple of days.  Reports episodes of emesis over the past couple of weeks.  Reports pain radiating to back; sharp in nature.  Pt also has hx of brain aneurysm.

## 2013-08-29 NOTE — ED Provider Notes (Signed)
Jeffrey Stephens is a 72 y.o. male who presents to Urgent Care today for severe abdominal pain. Patient has had mild to moderate abdominal pain present for the last 2-3 weeks. It has occurred off and on. He has tried [employer] as well as a proton pump inhibitor which has not helped much. However over the last 12 hours he has had worsening and severe mid epigastric abdominal pain. The pain radiates to the back. No nausea vomiting or diarrhea with the current pain. No syncope chest pains or palpitations.  His medical history is significant and concerning for aortic aneurysm last measured 3.4 cm in May per patient report.    Past Medical History  Diagnosis Date  . AAA (abdominal aortic aneurysm)   . TIA (transient ischemic attack)   . Arrhythmia   . DVT (deep venous thrombosis) 2001    Hx of Left leg   . Cancer     Skin ca.    History  Substance Use Topics  . Smoking status: Former Smoker    Quit date: 09/25/1992  . Smokeless tobacco: Never Used  . Alcohol Use: No   ROS as above Medications reviewed. Current Facility-Administered Medications  Medication Dose Route Frequency Provider Last Rate Last Dose  . 0.9 %  sodium chloride infusion   Intravenous Once Rodolph Bong, MD       Current Outpatient Prescriptions  Medication Sig Dispense Refill  . aspirin 81 MG tablet Take 81 mg by mouth daily.      . folic acid (FOLVITE) 1 MG tablet Take 1 mg by mouth daily.      . methotrexate (RHEUMATREX) 10 MG tablet Take 10 mg by mouth once a week. Caution: Chemotherapy. Protect from light.      . Multiple Vitamin (MULTIVITAMIN) tablet Take 1 tablet by mouth daily.      . Tamsulosin HCl (FLOMAX PO) Take by mouth.      . warfarin (COUMADIN) 10 MG tablet Take 10 mg by mouth daily.        Exam:  Temperature 97.72F heart rate 80 beats per minute blood pressure 130/80 oxygen sats 96% Gen: Well NAD HEENT: EOMI,  MMM Lungs: Normal work of breathing. CTABL Heart: RRR no MRG Abd: NABS, Soft. Nondistended  tender palpation diffusely. No palpable masses on deep palpation however this exam was somewhat limited for fear of ruptured abdominal aneurysm. Exts: Non edematous BL  LE, warm and well perfused.    Twelve-lead EKG shows normal sinus rhythm at 77 beats per minute with one PVC. No other significant abnormalities.  Assessment and Plan: 72 y.o. male with severe abdominal pain with a medical history significant for abdominal aortic aneurysm measuring 3.4 cm.  This is obviously concerning for aortic dissection or rupturing abdominal aortic aneurysm. Plan to place large-bore IVs and transfer immediately to the emergency room via EMS.     Rodolph Bong, MD 08/29/13 [phone]

## 2013-08-29 NOTE — ED Provider Notes (Signed)
CSN: [id number]     Arrival date & time 08/29/13  1856 History   First MD Initiated Contact with Patient 08/29/13 1858     Chief Complaint  Patient presents with  . Abdominal Pain   (Consider location/radiation/quality/duration/timing/severity/associated sxs/prior Treatment) Patient is a 72 y.o. male presenting with abdominal pain. The history is provided by the patient.  Abdominal Pain Pain location:  Epigastric and periumbilical Pain quality: sharp and stabbing   Pain radiates to:  Back Pain severity:  Severe Onset quality:  Sudden Duration:  1 day Timing:  Constant Progression:  Waxing and waning Chronicity:  New Context: not awakening from sleep, not eating, not previous surgeries, not recent sexual activity, not recent travel, not sick contacts and not trauma   Relieved by:  Nothing Worsened by:  Nothing tried Ineffective treatments:  Antacids, eating, OTC medications and NSAIDs Associated symptoms: nausea   Associated symptoms: no anorexia, no chest pain, no constipation, no diarrhea, no dysuria, no fever, no hematemesis, no hematochezia, no hematuria, no shortness of breath, no sore throat, no vaginal discharge and no vomiting     Jeffrey Stephens is a 73 y.o.male with a significant PMH of AAA, TIA, DVT, arrythmia, cancer of the skin presents to the ER transferred from UC, seen dr Dr. Earma Reading with complaints of severe abdominal pain. It has been bothering him intermittently over the past weeks and then today he developed severe periumbilical to epigastric pain that is stabbing and radiates to his back. It is associated with nausea and diaphoresis.   Past Medical History  Diagnosis Date  . AAA (abdominal aortic aneurysm)   . TIA (transient ischemic attack)   . Arrhythmia   . DVT (deep venous thrombosis) 2001    Hx of Left leg   . Cancer     Skin ca.    Past Surgical History  Procedure Laterality Date  . Cerebral aneurysm repair  1994    Hx of ruptured brain  aneurysm Tx by Dr. Danielle Dess  . Hemorrhoid surgery    . Kidney stone surgery     Family History  Problem Relation Age of Onset  . Heart disease Father     Aneurysm  . Diabetes Father   . Heart attack Father   . Heart disease Brother     Heart Disease before age 18  . Cancer Brother    History  Substance Use Topics  . Smoking status: Former Smoker    Quit date: 09/25/1992  . Smokeless tobacco: Never Used  . Alcohol Use: No    Review of Systems  Constitutional: Negative for fever.  HENT: Negative for sore throat.   Respiratory: Negative for shortness of breath.   Cardiovascular: Negative for chest pain.  Gastrointestinal: Positive for nausea and abdominal pain. Negative for vomiting, diarrhea, constipation, hematochezia, anorexia and hematemesis.  Genitourinary: Negative for dysuria, hematuria and vaginal discharge.    Allergies  Review of patient's allergies indicates no known allergies.  Home Medications   Current Outpatient Rx  Name  Route  Sig  Dispense  Refill  . aspirin 81 MG tablet   Oral   Take 81 mg by mouth daily.         . folic acid (FOLVITE) 1 MG tablet   Oral   Take 1 mg by mouth daily.         . lansoprazole (PREVACID) 30 MG capsule   Oral   Take 30 mg by mouth daily at 12 noon.         Marland Kitchen  methotrexate (RHEUMATREX) 2.5 MG tablet   Oral   Take 12.5 mg by mouth every 7 (seven) days. Monday         . Multiple Vitamin (MULTIVITAMIN) tablet   Oral   Take 1 tablet by mouth daily.         . Tamsulosin HCl (FLOMAX PO)   Oral   Take by mouth.          BP 106/65  Pulse 65  Temp(Src) 97.9 F (36.6 C)  Resp 18  SpO2 91% Physical Exam  Nursing note and vitals reviewed. Constitutional: He appears well-developed and well-nourished. No distress.  HENT:  Head: Normocephalic and atraumatic.  Eyes: Pupils are equal, round, and reactive to light.  Neck: Normal range of motion. Neck supple.  Cardiovascular: Normal rate and regular rhythm.     Pulmonary/Chest: Effort normal.  Abdominal: Soft. Bowel sounds are normal. There is tenderness. There is guarding. There is no CVA tenderness, no tenderness at McBurney's point and negative Murphy's sign.    Truncal obesity limits exam No pulsatile mass noted  Neurological: He is alert.  Skin: Skin is warm and dry.    ED Course  Procedures (including critical care time) Labs Review Labs Reviewed  COMPREHENSIVE METABOLIC PANEL - Abnormal; Notable for the following:    Glucose, Bld 117 (*)    GFR calc non Af Amer 81 (*)    All other components within normal limits  URINALYSIS, ROUTINE W REFLEX MICROSCOPIC - Abnormal; Notable for the following:    Specific Gravity, Urine 1.039 (*)    All other components within normal limits  CBC WITH DIFFERENTIAL  PROTIME-INR  TROPONIN I  PRO B NATRIURETIC PEPTIDE  LIPASE, BLOOD   Imaging Review Ct Cta Abd/pel W/cm &/or W/o Cm  08/29/2013   CLINICAL DATA:  Severe central abdominal pain and nausea. Known abdominal aortic aneurysm.  EXAM: CT ANGIOGRAPHY ABDOMEN AND PELVIS WITH CONTRAST AND WITHOUT CONTRAST  TECHNIQUE: Multidetector CT imaging of the abdomen and pelvis was performed using the standard protocol during bolus administration of intravenous contrast. Multiplanar reconstructed images including MIPs were obtained and reviewed to evaluate the vascular anatomy.  CONTRAST:  OMNIPAQUE IOHEXOL 350 MG/ML SOLN  COMPARISON:  CT of the abdomen and pelvis performed 06/16/2008  FINDINGS: There is a 1.4 cm nodule along the medial right lower lobe; this is stable from prior studies, only minimally increased in size from 2009, and is likely benign. Additional pulmonary nodules are better characterized on prior CTs of the chest. Underlying emphysematous change is noted, with bibasilar scarring and blebs.  The patient's known abdominal aortic aneurysm measures 3.5 cm in AP dimension. There is no evidence of leak or significant thrombosis. Associated  calcific atherosclerotic disease is noted. The celiac trunk, superior mesenteric artery, bilateral renal arteries and inferior mesenteric artery remain patent. The inferior mesenteric artery arises from the inferior aspect of the infrarenal aneurysm. The aneurysm resolves proximal to the aortic bifurcation. Scattered calcific atherosclerotic disease is noted along the branches of the abdominal aorta.  Mild focal contrast blush within the inferior tip of the right hepatic lobe is stable from 2009 and likely benign. The liver is otherwise grossly unremarkable in appearance. The spleen is within normal limits. The gallbladder is unremarkable. The pancreas and adrenal glands are unremarkable in appearance.  There is mild right renal atrophy. Prominent left-sided renal cysts are seen measuring up to 5.8 cm in size; smaller right-sided renal cysts are also seen. Nonspecific perinephric stranding is noted  bilaterally. No renal or ureteral stones are identified.  No free fluid is identified. The small bowel is unremarkable in appearance. The stomach is within normal limits. No acute vascular abnormalities are seen.  The appendix is not definitely seen; there is no evidence for appendicitis. The ascending colon is mildly tortuous in appearance. Scattered diverticulosis is noted along the proximal to mid sigmoid colon, without evidence of diverticulitis.  The bladder is mildly distended and grossly unremarkable in appearance. The prostate is enlarged, measuring 6.3 cm in transverse dimension, with scattered calcification. No inguinal lymphadenopathy is seen.  No acute osseous abnormalities are identified. There are chronic bilateral pars defects at L5, with grade 2 anterolisthesis of L5 on S1. Underlying vacuum phenomenon is noted. There is also minimal grade 1 retrolisthesis of L4 on L5.  Review of the MIP images confirms the above findings.  IMPRESSION: 1. No acute abnormality seen to explain the patient's symptoms. 2.  Abdominal aortic aneurysm noted, measuring 3.5 cm in AP dimension. No evidence of leak or significant thrombosis. Associated calcific atherosclerotic disease seen. Branches of the abdominal aorta remain patent. 3. Scattered bilateral renal cysts, more prominent on the left; mild right renal atrophy. 4. Scattered diverticulosis along the proximal to mid sigmoid colon, without evidence of diverticulitis. 5. Enlarged prostate again noted. 6. Chronic bilateral pars defects at L5, with grade 2 anterolisthesis of L5 on S1. 7. Stable pulmonary nodules, with underlying emphysematous change, bibasilar scarring and blebs.   Electronically Signed   By: Roanna Raider M.D.   On: 08/29/2013 21:19    EKG Interpretation   None       MDM   1. Gastritis    Patients labs are reassuring, images do not show any dissection or rupture of aneurysm. Pain easily managed with pain medication. Dr. Elesa Massed saw patient and added GI cocktail and Protonix. Re-evaluated abdeomen right before discharge. Pt is now non tender. Has appointment already scheduled for Monday with PCP.  72 y.o.Fransico Him Brinkmeier's evaluation in the Emergency Department is complete. It has been determined that no acute conditions requiring further emergency intervention are present at this time. The patient/guardian have been advised of the diagnosis and plan. We have discussed signs and symptoms that warrant return to the ED, such as changes or worsening in symptoms.  Vital signs are stable at discharge. Filed Vitals:   08/29/13 2215  BP: 106/65  Pulse: 65  Temp:   Resp: 18    Patient/guardian has voiced understanding and agreed to follow-up with the PCP or specialist.     Dorthula Matas, PA-C 08/29/13 2255

## 2013-08-29 NOTE — ED Notes (Signed)
C/o severe abdominal pain.  Pain comes and goes over the past 2 wks.  States today pain is worse and has lasted for about a 2 hour period straight.  Tried tums with no relief.  Hx of abdominal anurysm.  Having nausea.  Denies vomiting and diarrhea.

## 2013-08-30 NOTE — ED Provider Notes (Signed)
Medical screening examination/treatment/procedure(s) were conducted as a shared visit with non-physician practitioner(s) and myself.  I personally evaluated the patient during the encounter.   Date: 08/30/2013 18:21  Rate: 77  Rhythm: normal sinus rhythm  QRS Axis: normal  Intervals: normal  ST/T Wave abnormalities: normal  Conduction Disutrbances: none  Narrative Interpretation: unremarkable; PVCs, no ischemic changes  Patient is a 72 year old male with a history of AAA, TIA, DVT presents emergency department with several weeks of epigastric pain that has been intermittent. The episode today started this morning. He describes it as a dull ache it is worse with palpation. States his symptoms worsened after eating fried fish tonight. Denies any shortness of breath, nausea or vomiting, diaphoresis or dizziness. He was sent from urgent care to rule out dissection or ruptured AAA. On exam, patient is hemodynamically stable, well appearing, heart and lung sounds are normal, tender to palpation over his mid abdomen with no guarding, negative Murphy sign, no signs of peripheral edema. Labs are unremarkable.  CT scan shows stable aneurysm with no other acute abnormality. Suspect gastritis versus possible biliary colic. We'll give GI cocktail and have patient followup with his primary care physician for close outpatient followup for possible endoscopy or ultrasound. I do not feel he needs an emergent abdominal ultrasound this time she has no fever, has normal LFTs and white count, is hemodynamically stable and his pain is are improved and he is able to tolerate by mouth. Given his symptoms have been present since this morning, do not feel he needs serial set of cardiac enzymes. His EKG shows no acute abnormality. Patient is comfortable with plan for discharge home with outpatient followup.   ,  Layla Maw Kelby Lotspeich, DO 08/30/13 0010

## 2013-09-05 ENCOUNTER — Other Ambulatory Visit: Payer: Self-pay | Admitting: Gastroenterology

## 2013-09-05 DIAGNOSIS — R109 Unspecified abdominal pain: Secondary | ICD-10-CM

## 2013-09-09 ENCOUNTER — Ambulatory Visit
Admission: RE | Admit: 2013-09-09 | Discharge: 2013-09-09 | Disposition: A | Discharge status: Home / Self Care | Payer: 59 | Source: Ambulatory Visit | Attending: Gastroenterology | Admitting: Gastroenterology

## 2013-09-09 DIAGNOSIS — R109 Unspecified abdominal pain: Secondary | ICD-10-CM

## 2014-02-10 ENCOUNTER — Encounter: Payer: Self-pay | Admitting: Vascular Surgery

## 2014-02-11 ENCOUNTER — Ambulatory Visit (HOSPITAL_COMMUNITY)
Admission: RE | Admit: 2014-02-11 | Discharge: 2014-02-11 | Disposition: A | Discharge status: Home / Self Care | Payer: Medicare Other | Source: Ambulatory Visit | Attending: Vascular Surgery | Admitting: Vascular Surgery

## 2014-02-11 ENCOUNTER — Ambulatory Visit (INDEPENDENT_AMBULATORY_CARE_PROVIDER_SITE_OTHER): Payer: 59 | Admitting: Vascular Surgery

## 2014-02-11 ENCOUNTER — Encounter: Payer: Self-pay | Admitting: Vascular Surgery

## 2014-02-11 VITALS — BP 118/77 | HR 71 | Ht 69.0 in | Wt 228.0 lb

## 2014-02-11 DIAGNOSIS — I714 Abdominal aortic aneurysm, without rupture, unspecified: Secondary | ICD-10-CM | POA: Insufficient documentation

## 2014-02-11 NOTE — Progress Notes (Signed)
[facility]  Patient name: Jeffrey Stephens MRN: [id number] DOB: 08-Jun-1941 Sex: male  REASON FOR VISIT: Follow up of abdominal aortic aneurysm.  HPI: Jeffrey Stephens is a 73 y.o. male who I last saw on 02/05/2013. I have been following him with an abdominal aortic aneurysm. At the time of his last visit the aneurysm had increased slightly from 3.0-3.4 cm. He was set up for a one year follow up visit.  Since I saw him last he denies any significant abdominal pain or back pain. He is not a smoker. His blood pressure and cholesterol have been under good control. There have been no significant change in his medical history.  Past Medical History  Diagnosis Date  . AAA (abdominal aortic aneurysm)   . TIA (transient ischemic attack)   . Arrhythmia   . DVT (deep venous thrombosis) 2001    Hx of Left leg   . Cancer     Skin ca.    Family History  Problem Relation Age of Onset  . Heart disease Father     Aneurysm  . Diabetes Father   . Heart attack Father   . Heart disease Brother     Heart Disease before age 44  . Cancer Brother    SOCIAL HISTORY: History  Substance Use Topics  . Smoking status: Former Smoker    Quit date: 09/25/1992  . Smokeless tobacco: Never Used  . Alcohol Use: No   No Known Allergies Current Outpatient Prescriptions  Medication Sig Dispense Refill  . aspirin 81 MG tablet Take 81 mg by mouth daily.      . folic acid (FOLVITE) 1 MG tablet Take 1 mg by mouth daily.      . lansoprazole (PREVACID) 30 MG capsule Take 30 mg by mouth daily at 12 noon.      . methotrexate (RHEUMATREX) 2.5 MG tablet Take 12.5 mg by mouth every 7 (seven) days. Monday      . Multiple Vitamin (MULTIVITAMIN) tablet Take 1 tablet by mouth daily.      . ondansetron (ZOFRAN) 4 MG tablet Take 1 tablet (4 mg total) by mouth every 6 (six) hours.  12 tablet  0  . oxyCODONE-acetaminophen (PERCOCET/ROXICET) 5-325 MG per tablet Take 1 tablet by mouth every 6  (six) hours as needed for severe pain.  15 tablet  0  . Tamsulosin HCl (FLOMAX PO) Take by mouth.       No current facility-administered medications for this visit.   REVIEW OF SYSTEMS: Valu.Nieves ] denotes positive finding; [  ] denotes negative finding  CARDIOVASCULAR:  [ ]  chest pain   [ ]  chest pressure   [ ]  palpitations   [ ]  orthopnea   [ ]  dyspnea on exertion   [ ]  claudication   [ ]  rest pain   [ ]  DVT   [ ]  phlebitis PULMONARY:   [ ]  productive cough   [ ]  asthma   [ ]  wheezing NEUROLOGIC:   [ ]  weakness  [ ]  paresthesias  [ ]  aphasia  [ ]  amaurosis  [ ]  dizziness HEMATOLOGIC:   [ ]  bleeding problems   [ ]  clotting disorders MUSCULOSKELETAL:  [ ]  joint pain   [ ]  joint swelling [ ]  leg swelling GASTROINTESTINAL: [ ]   blood in stool  [ ]   hematemesis GENITOURINARY:  [ ]   dysuria  [ ]   hematuria PSYCHIATRIC:  [ ]  history of major depression INTEGUMENTARY:  [ ]   rashes  [ ]  ulcers CONSTITUTIONAL:  [ ]  fever   [ ]  chills  PHYSICAL EXAM: Filed Vitals:   02/11/14 0850  BP: 118/77  Pulse: 71  Height: 5\' 9"  (1.753 m)  Weight: 228 lb (103.42 kg)  SpO2: 95%   Body mass index is 33.65 kg/(m^2). GENERAL: The patient is a well-nourished male, in no acute distress. The vital signs are documented above. CARDIOVASCULAR: There is a regular rate and rhythm. I do not detect carotid bruits. He has palpable femoral pulses. He has no significant lower extremity swelling. PULMONARY: There is good air exchange bilaterally without wheezing or rales. ABDOMEN: Soft and non-tender with normal pitched bowel sounds. It is difficult to palpate his aneurysm because of his size. MUSCULOSKELETAL: There are no major deformities or cyanosis. NEUROLOGIC: No focal weakness or paresthesias are detected. SKIN: There are no ulcers or rashes noted. PSYCHIATRIC: The patient has a normal affect.  DATA:  Have independently interpreted his ultrasound of his aorta which shows that the maximum diameter is 3.27 cm. Thus there  has been no significant change over the last year.  In addition he was having some GI symptoms in December of 2014 and had a CT scan at that time. By my measurement the aneurysm measures 3.3 cm in maximum diameter by CT scan.  MEDICAL ISSUES:  ABDOMINAL AORTIC ANEURYSM His aneurysm is 3.3 cm in maximum diameter. Thus there has been no significant change in the size of his aneurysm. He understands we would not consider elective repair unless it reached 5.5 cm in maximum diameter. Fortunately he is not a smoker. His blood pressure is under good control. I'll see him back in 18 months. He knows to call sooner if he has problems.    Return in about 18 months (around 08/15/2015).  Angelia Mould [facility] Beeper: [phone]

## 2014-02-11 NOTE — Addendum Note (Signed)
Addended by: Mena Goes on: 02/11/2014 09:45 AM   Modules accepted: Orders

## 2014-02-11 NOTE — Assessment & Plan Note (Signed)
His aneurysm is 3.3 cm in maximum diameter. Thus there has been no significant change in the size of his aneurysm. He understands we would not consider elective repair unless it reached 5.5 cm in maximum diameter. Fortunately he is not a smoker. His blood pressure is under good control. I'll see him back in 18 months. He knows to call sooner if he has problems.

## 2014-04-09 ENCOUNTER — Other Ambulatory Visit: Payer: Self-pay | Admitting: Orthopedic Surgery

## 2014-04-27 ENCOUNTER — Other Ambulatory Visit: Payer: Self-pay | Admitting: Orthopedic Surgery

## 2014-04-27 NOTE — H&P (Signed)
Jeffrey Stephens DOB: 1940/11/18 Widowed / Language: Jeffrey Stephens / Race: White Male  H&P date: 04/27/14  Chief Complaint: L knee pain  History of Present Illness The patient is a 73 year old male who comes in today for a preoperative History and Physical. The patient is scheduled for a left total knee arthroplasty to be performed by Dr. Johnn Hai, MD at [facility] on May 14, 2014. Still reports intermittently disabling pain in his left and right knee. He has reduced his level of activity. He does not walk anymore. He has sharp pain when he pivots. Dr. Tonita Cong had an extensive discussion concerning his current situation given the duration of his symptoms and level of disability. We will proceed with a total knee. We mutually agreed to proceed with a total knee replacement. Risks and benefits of the procedure were discussed including stiffness, suboptimal range of motion, persistent pain, infection requiring removal of prosthesis and reinsertion, need for prophylactic antibiotics in the future, for example, dental procedures, possible need for manipulation, revision in the future and also anesthetic complications including DVT, PE, etc. We discussed the perioperative course, time in the hospital, postoperative recovery and the need for elevation to control swelling. We also discussed the predicted range of motion and the probability that squatting and kneeling would be unobtainable in the future. In addition, postoperative anticoagulation was discussed. We will obtain preoperative medical clearance if necessary. Provided the patient with illustrated handout and discussed it in detail. They will enroll in the total joint replacement educational forum at the hospital. He has obtained clearance from Dr. Laurann Montana and Dr. Trudie Reed. He has been told to hold MTX x 1 week pre-op. He is also on ASA 81mg  daily.  Allergies  No Known Drug Allergies06/10/2013  Family History Rheumatoid Arthritis  Mother. First Degree Relatives  Social History Tobacco use Former smoker. 02/24/2014: smoke(d) 2 1/2 pack(s) per day Exercise Exercises rarely; does running / walking Marital status widowed Current work status retired Children 3 Never consumed alcohol 02/24/2014: Never consumed alcohol Tobacco / smoke exposure 02/24/2014: no Number of flights of stairs before winded 2-3 No history of drug/alcohol rehab Not under pain contract Living situation lives alone, has a caregiver lined up after surgery will come stay with him. He has 3 steps to enter his one-level home Advance Directives HPOA  Medication History  Methotrexate (Arthritis) (2.5MG  Tablet, 5 Oral once a week) Active. Folic Acid (1MG  Tablet, Oral daily) Active. Lansoprazole (30MG  Capsule DR, Oral) Active. Tamsulosin HCl (0.4MG  Capsule, Oral) Active. Aspirin (81MG  Tablet, 1 (one) Oral) Active. Medications Reconciled  Past Surgical History Hemorrhoidectomy Other Orthopaedic Surgery Other Surgery Aneurysm repair  Past Medical Hx Rheumatoid Arthritis Osteoporosis Prostate Disease Chronic Pain Kidney Stone Stroke 1994 Dentures Aneurysm Hemorrhoids Deep vein thrombosis LLE- calf Measles  Review of Systems  General Not Present- Chills, Fatigue, Fever, Memory Loss, Night Sweats, Weight Gain and Weight Loss. Skin Not Present- Eczema, Hives, Itching, Lesions and Rash. HEENT Present- Dentures and Tinnitus. Not Present- Double Vision, Headache, Hearing Loss and Visual Loss. Respiratory Present- Shortness of breath with exertion. Not Present- Allergies, Chronic Cough, Coughing up blood and Shortness of breath at rest. Cardiovascular Present- Racing/skipping heartbeats. Not Present- Chest Pain, Difficulty Breathing Lying Down, Murmur, Palpitations and Swelling. Gastrointestinal Present- Constipation and Heartburn. Not Present- Abdominal Pain, Bloody Stool, Diarrhea, Difficulty Swallowing,  Jaundice, Loss of appetitie, Nausea and Vomiting. Male Genitourinary Present- Urinary frequency and Weak urinary stream. Not Present- Blood in Urine, Discharge, Flank Pain, Incontinence, Painful  Urination, Urgency, Urinary Retention and Urinating at Night. Musculoskeletal Present- Back Pain, Joint Pain, Joint Swelling and Morning Stiffness. Not Present- Muscle Pain, Muscle Weakness and Spasms. Neurological Not Present- Blackout spells, Difficulty with balance, Dizziness, Paralysis, Tremor and Weakness. Psychiatric Not Present- Insomnia.  Physical Exam General Mental Status -Alert, cooperative and good historian. General Appearance-pleasant, Not in acute distress. Orientation-Oriented X3. Build & Nutrition-Well nourished and Well developed.  Head and Neck Head-normocephalic, atraumatic . Neck Global Assessment - supple, no bruit auscultated on the right, no bruit auscultated on the left.  Eye Pupil - Bilateral-Regular and Round. Motion - Bilateral-EOMI.  Chest and Lung Exam Auscultation Breath sounds - clear at anterior chest wall and clear at posterior chest wall. Adventitious sounds - No Adventitious sounds.  Cardiovascular Auscultation Rhythm - Regular rate and rhythm. Heart Sounds - S1 WNL and S2 WNL. Murmurs & Other Heart Sounds - Auscultation of the heart reveals - No Murmurs.  Abdomen Palpation/Percussion Tenderness - Abdomen is non-tender to palpation. Rigidity (guarding) - Abdomen is soft. Auscultation Auscultation of the abdomen reveals - Bowel sounds normal.  Male Genitourinary Note: Not done, not pertinent to present illness  Musculoskeletal Note: On exam, he has severe pain when he attempts to stand from the seated position. He has a varus thrust and varus deformity of the knee. He is tender in the medial joint line. Patellofemoral pain with compression. Range is 0-110. Slight instability with valgus stress. Ipsilateral hip and ankle exam is  unremarkable. Right knee has varus deformity as well as tender in the medial joint line. Pulses are intact.  Imaging AP standing and lateral demonstrates end stage osteoarthrosis patellofemoral joint and medial compartment, varus deformity, peri-articular spurring B/L knees.  Assessment & Plan Pt with B/L knee DJD, refractory to conservative tx, scheduled for L total knee replacement by Dr. Tonita Cong on 05/14/14. He has been cleared by Dr. Trudie Reed and Dr. Laurann Montana. We discussed again the procedure itself as well as risks, complications and alternatives, including but not limited to DVT, PE, infx, bleeding, failure of procedure, need for secondary procedure including manipulation, nerve injury, ongoing pain/symptoms, anesthesia risk, even stroke or death. Also discussed typical post-op protocols, activity restrictions, need for PT, flexion/extension exercises, time out of work. Discussed need for DVT ppx post-op with Xarelto then ASA per protocol. Discussed dental ppx. Also discussed limitations post-operatively such as kneeling and squatting. All questions were answered. Patient desires to proceed with surgery. He will remain NPO after MN night prior to surgery. Will hold Methotrexate, ASA, supplements, NSAIDs accordingly as directed. We discussed post-op plan, pain medication, colace, Xarelto, which he may require for 3 weeks post-op given remote hx of DVT on the operative side. He does tolerate Norco well for pain currently. He plans to return home with HHPT post-op. He will follow up 10-14 days post-op for staple removal and xrays and call with any questions or concerns in the interim.  Signed electronically by Lacie Draft, PA-C for Dr. Tonita Cong

## 2014-05-01 ENCOUNTER — Encounter (HOSPITAL_COMMUNITY): Payer: Self-pay | Admitting: Pharmacy Technician

## 2014-05-06 ENCOUNTER — Encounter (HOSPITAL_COMMUNITY): Payer: Self-pay

## 2014-05-06 NOTE — Patient Instructions (Addendum)
[name]  05/06/2014                           YOUR PROCEDURE IS SCHEDULED ON: 05/14/14               ENTER THRU [facility] ENTRANCE AND                            FOLLOW  SIGNS TO SHORT STAY CENTER                 ARRIVE AT SHORT STAY AT: 5:30 AM               CALL THIS NUMBER IF ANY PROBLEMS THE DAY OF SURGERY :               [phone]                                REMEMBER:   Do not eat food or drink liquids AFTER MIDNIGHT                  Take these medicines the morning of surgery with               A SIPS OF WATER :      PREVACID / MAY TAKE PERCOCET / FLOMAX IF NEEDED   Do not wear jewelry, make-up   Do not wear lotions, powders, or perfumes.   Do not shave legs or underarms 12 hrs. before surgery (men may shave face)  Do not bring valuables to the hospital.  Contacts, dentures or bridgework may not be worn into surgery.  Leave suitcase in the car. After surgery it may be brought to your room.  For patients admitted to the hospital more than one night, checkout time is            11:00 AM                                                          ________________________________________________________________________                                                                     [place]  Before surgery, you can play an important role.  Because skin is not sterile, your skin needs to be as free of germs as possible.  You can reduce the number of germs on your skin by washing with CHG (chlorahexidine gluconate) soap before surgery.  CHG is an antiseptic cleaner which kills germs and bonds with the skin to continue killing germs even after washing. Please DO NOT use if you have an allergy to CHG or antibacterial soaps.  If your skin becomes reddened/irritated stop using the CHG and inform your nurse when you arrive at Short Stay. Do not shave (including legs and underarms) for at least 48 hours prior to  the first  CHG shower.  You may shave your face. Please follow these instructions carefully:   1.  Shower with CHG Soap the night before surgery and the  morning of Surgery.   2.  If you choose to wash your hair, wash your hair first as usual with your  normal  Shampoo.   3.  After you shampoo, rinse your hair and body thoroughly to remove the  shampoo.                                         4.  Use CHG as you would any other liquid soap.  You can apply chg directly  to the skin and wash . Gently wash with scrungie or clean wascloth    5.  Apply the CHG Soap to your body ONLY FROM THE NECK DOWN.   Do not use on open                           Wound or open sores. Avoid contact with eyes, ears mouth and genitals (private parts).                        Genitals (private parts) with your normal soap.              6.  Wash thoroughly, paying special attention to the area where your surgery  will be performed.   7.  Thoroughly rinse your body with warm water from the neck down.   8.  DO NOT shower/wash with your normal soap after using and rinsing off  the CHG Soap .                9.  Pat yourself dry with a clean towel.             10.  Wear clean pajamas.             11.  Place clean sheets on your bed the night of your first shower and do not  sleep with pets.  Day of Surgery : Do not apply any lotions/deodorants the morning of surgery.  Please wear clean clothes to the hospital/surgery center.  FAILURE TO FOLLOW THESE INSTRUCTIONS MAY RESULT IN THE CANCELLATION OF YOUR SURGERY    PATIENT SIGNATURE_________________________________  ______________________________________________________________________     [name]  An incentive spirometer is a tool that can help keep your lungs clear and active. This tool measures how well you are filling your lungs with each breath. Taking long deep breaths may help reverse or decrease the chance of developing breathing  (pulmonary) problems (especially infection) following:  A long period of time when you are unable to move or be active. BEFORE THE PROCEDURE   If the spirometer includes an indicator to show your best effort, your nurse or respiratory therapist will set it to a desired goal.  If possible, sit up straight or lean slightly forward. Try not to slouch.  Hold the incentive spirometer in an upright position. INSTRUCTIONS FOR USE  1. Sit on the edge of your bed if possible, or sit up as far as you can in bed or on a chair. 2. Hold the incentive spirometer in an upright position. 3. Breathe out normally. 4. Place the mouthpiece in your mouth and seal your lips tightly around it. 5. Breathe in  slowly and as deeply as possible, raising the piston or the ball toward the top of the column. 6. Hold your breath for 3-5 seconds or for as long as possible. Allow the piston or ball to fall to the bottom of the column. 7. Remove the mouthpiece from your mouth and breathe out normally. 8. Rest for a few seconds and repeat Steps 1 through 7 at least 10 times every 1-2 hours when you are awake. Take your time and take a few normal breaths between deep breaths. 9. The spirometer may include an indicator to show your best effort. Use the indicator as a goal to work toward during each repetition. 10. After each set of 10 deep breaths, practice coughing to be sure your lungs are clear. If you have an incision (the cut made at the time of surgery), support your incision when coughing by placing a pillow or rolled up towels firmly against it. Once you are able to get out of bed, walk around indoors and cough well. You may stop using the incentive spirometer when instructed by your caregiver.  RISKS AND COMPLICATIONS  Take your time so you do not get dizzy or light-headed.  If you are in pain, you may need to take or ask for pain medication before doing incentive spirometry. It is harder to take a deep breath if you  are having pain. AFTER USE  Rest and breathe slowly and easily.  It can be helpful to keep track of a log of your progress. Your caregiver can provide you with a simple table to help with this. If you are using the spirometer at home, follow these instructions: [place] IF:   You are having difficultly using the spirometer.  You have trouble using the spirometer as often as instructed.  Your pain medication is not giving enough relief while using the spirometer.  You develop fever of 100.5 F (38.1 C) or higher. SEEK IMMEDIATE MEDICAL CARE IF:   You cough up bloody sputum that had not been present before.  You develop fever of 102 F (38.9 C) or greater.  You develop worsening pain at or near the incision site. MAKE SURE YOU:   Understand these instructions.  Will watch your condition.  Will get help right away if you are not doing well or get worse. Document Released: 01/22/2007 Document Revised: 12/04/2011 Document Reviewed: 03/25/2007 Exit[employer] Patient Information 2014 ExitCare, [place].   ________________________________________________________________________  WHAT IS A BLOOD TRANSFUSION? Blood Transfusion Information  A transfusion is the replacement of blood or some of its parts. Blood is made up of multiple cells which provide different functions.  Red blood cells carry oxygen and are used for blood loss replacement.  White blood cells fight against infection.  Platelets control bleeding.  Plasma helps clot blood.  Other blood products are available for specialized needs, such as hemophilia or other clotting disorders. BEFORE THE TRANSFUSION  Who gives blood for transfusions?   Healthy volunteers who are fully evaluated to make sure their blood is safe. This is blood bank blood. Transfusion therapy is the safest it has ever been in the practice of medicine. Before blood is taken from a donor, a complete history is taken to make sure that person has  no history of diseases nor engages in risky social behavior (examples are intravenous drug use or sexual activity with multiple partners). The donor's travel history is screened to minimize risk of transmitting infections, such as malaria. The donated blood is tested for  signs of infectious diseases, such as HIV and hepatitis. The blood is then tested to be sure it is compatible with you in order to minimize the chance of a transfusion reaction. If you or a relative donates blood, this is often done in anticipation of surgery and is not appropriate for emergency situations. It takes many days to process the donated blood. RISKS AND COMPLICATIONS Although transfusion therapy is very safe and saves many lives, the main dangers of transfusion include:   Getting an infectious disease.  Developing a transfusion reaction. This is an allergic reaction to something in the blood you were given. Every precaution is taken to prevent this. The decision to have a blood transfusion has been considered carefully by your caregiver before blood is given. Blood is not given unless the benefits outweigh the risks. AFTER THE TRANSFUSION  Right after receiving a blood transfusion, you will usually feel much better and more energetic. This is especially true if your red blood cells have gotten low (anemic). The transfusion raises the level of the red blood cells which carry oxygen, and this usually causes an energy increase.  The nurse administering the transfusion will monitor you carefully for complications. HOME CARE INSTRUCTIONS  No special instructions are needed after a transfusion. You may find your energy is better. Speak with your caregiver about any limitations on activity for underlying diseases you may have. SEEK MEDICAL CARE IF:   Your condition is not improving after your transfusion.  You develop redness or irritation at the intravenous (IV) site. SEEK IMMEDIATE MEDICAL CARE IF:  Any of the following  symptoms occur over the next 12 hours:  Shaking chills.  You have a temperature by mouth above 102 F (38.9 C), not controlled by medicine.  Chest, back, or muscle pain.  People around you feel you are not acting correctly or are confused.  Shortness of breath or difficulty breathing.  Dizziness and fainting.  You get a rash or develop hives.  You have a decrease in urine output.  Your urine turns a dark color or changes to pink, red, or brown. Any of the following symptoms occur over the next 10 days:  You have a temperature by mouth above 102 F (38.9 C), not controlled by medicine.  Shortness of breath.  Weakness after normal activity.  The white part of the eye turns yellow (jaundice).  You have a decrease in the amount of urine or are urinating less often.  Your urine turns a dark color or changes to pink, red, or brown. Document Released: 09/08/2000 Document Revised: 12/04/2011 Document Reviewed: 04/27/2008 [facility] Patient Information 2014 [place], [place].  _______________________________________________________________________

## 2014-05-07 ENCOUNTER — Ambulatory Visit (HOSPITAL_COMMUNITY)
Admission: RE | Admit: 2014-05-07 | Discharge: 2014-05-07 | Disposition: A | Discharge status: Home / Self Care | Payer: Medicare Other | Source: Ambulatory Visit | Attending: Orthopedic Surgery | Admitting: Orthopedic Surgery

## 2014-05-07 ENCOUNTER — Ambulatory Visit (HOSPITAL_COMMUNITY)
Admission: RE | Admit: 2014-05-07 | Discharge: 2014-05-07 | Disposition: A | Discharge status: Home / Self Care | Payer: Medicare Other | Source: Ambulatory Visit | Attending: Anesthesiology | Admitting: Anesthesiology

## 2014-05-07 ENCOUNTER — Encounter (HOSPITAL_COMMUNITY): Payer: Self-pay

## 2014-05-07 ENCOUNTER — Ambulatory Visit (HOSPITAL_COMMUNITY)
Admission: RE | Admit: 2014-05-07 | Discharge: 2014-05-07 | Disposition: A | Discharge status: Home / Self Care | Payer: Medicare Other | Source: Ambulatory Visit | Attending: Specialist | Admitting: Specialist

## 2014-05-07 ENCOUNTER — Encounter (HOSPITAL_COMMUNITY)
Admission: RE | Admit: 2014-05-07 | Discharge: 2014-05-07 | Disposition: A | Discharge status: Home / Self Care | Payer: Medicare Other | Source: Ambulatory Visit | Attending: Specialist | Admitting: Specialist

## 2014-05-07 DIAGNOSIS — Z01818 Encounter for other preprocedural examination: Secondary | ICD-10-CM | POA: Insufficient documentation

## 2014-05-07 HISTORY — DX: Constipation, unspecified: K59.00

## 2014-05-07 HISTORY — DX: Personal history of other specified conditions: Z87.898

## 2014-05-07 HISTORY — DX: Benign prostatic hyperplasia without lower urinary tract symptoms: N40.0

## 2014-05-07 HISTORY — DX: Gastro-esophageal reflux disease without esophagitis: K21.9

## 2014-05-07 HISTORY — DX: Frequency of micturition: R35.0

## 2014-05-07 LAB — URINALYSIS, ROUTINE W REFLEX MICROSCOPIC
BILIRUBIN URINE: NEGATIVE
Glucose, UA: NEGATIVE mg/dL
Hgb urine dipstick: NEGATIVE
KETONES UR: NEGATIVE mg/dL
Leukocytes, UA: NEGATIVE
Nitrite: NEGATIVE
PROTEIN: NEGATIVE mg/dL
Specific Gravity, Urine: 1.013 (ref 1.005–1.030)
UROBILINOGEN UA: 0.2 mg/dL (ref 0.0–1.0)
pH: 6.5 (ref 5.0–8.0)

## 2014-05-07 LAB — CBC
HCT: 43.3 % (ref 39.0–52.0)
Hemoglobin: 15.4 g/dL (ref 13.0–17.0)
MCH: 32.3 pg (ref 26.0–34.0)
MCHC: 35.6 g/dL (ref 30.0–36.0)
MCV: 90.8 fL (ref 78.0–100.0)
Platelets: 192 10*3/uL (ref 150–400)
RBC: 4.77 MIL/uL (ref 4.22–5.81)
RDW: 13.9 % (ref 11.5–15.5)
WBC: 6.6 10*3/uL (ref 4.0–10.5)

## 2014-05-07 LAB — BASIC METABOLIC PANEL
ANION GAP: 12 (ref 5–15)
BUN: 14 mg/dL (ref 6–23)
CO2: 23 mEq/L (ref 19–32)
Calcium: 9.3 mg/dL (ref 8.4–10.5)
Chloride: 104 mEq/L (ref 96–112)
Creatinine, Ser: 1.01 mg/dL (ref 0.50–1.35)
GFR calc Af Amer: 84 mL/min — ABNORMAL LOW (ref >90)
GFR calc non Af Amer: 73 mL/min — ABNORMAL LOW (ref >90)
Glucose, Bld: 109 mg/dL — ABNORMAL HIGH (ref 70–99)
Potassium: 4.5 mEq/L (ref 3.7–5.3)
Sodium: 139 mEq/L (ref 137–147)

## 2014-05-07 LAB — SURGICAL PCR SCREEN
MRSA, PCR: NEGATIVE
STAPHYLOCOCCUS AUREUS: NEGATIVE

## 2014-05-07 LAB — PROTIME-INR
INR: 1.07 (ref 0.00–1.49)
PROTHROMBIN TIME: 13.9 s (ref 11.6–15.2)

## 2014-05-07 LAB — ABO/RH: ABO/RH(D): B POS

## 2014-05-07 NOTE — Progress Notes (Signed)
05/07/14 0938  OBSTRUCTIVE SLEEP APNEA  Have you ever been diagnosed with sleep apnea through a sleep study? No  Do you snore loudly (loud enough to be heard through closed doors)?  0  Do you often feel tired, fatigued, or sleepy during the daytime? 1  Has anyone observed you stop breathing during your sleep? 0  Do you have, or are you being treated for high blood pressure? 0  BMI more than 35 kg/m2? 0  Age over 72 years old? 1  Neck circumference greater than 40 cm/16 inches? 1  Gender: 1  Obstructive Sleep Apnea Score 4  Score 4 or greater  Results sent to PCP

## 2014-05-14 ENCOUNTER — Encounter (HOSPITAL_COMMUNITY): Payer: Medicare Other | Admitting: Certified Registered Nurse Anesthetist

## 2014-05-14 ENCOUNTER — Inpatient Hospital Stay (HOSPITAL_COMMUNITY): Payer: Medicare Other | Admitting: Certified Registered Nurse Anesthetist

## 2014-05-14 ENCOUNTER — Encounter (HOSPITAL_COMMUNITY)
Admission: RE | Disposition: A | Discharge status: Home Health | Payer: Self-pay | Source: Ambulatory Visit | Attending: Specialist

## 2014-05-14 ENCOUNTER — Encounter (HOSPITAL_COMMUNITY): Payer: Self-pay

## 2014-05-14 ENCOUNTER — Inpatient Hospital Stay (HOSPITAL_COMMUNITY): Payer: Medicare Other

## 2014-05-14 ENCOUNTER — Inpatient Hospital Stay (HOSPITAL_COMMUNITY)
Admission: RE | Admit: 2014-05-14 | Discharge: 2014-05-18 | DRG: 470 | Disposition: A | Discharge status: Home Health | Payer: Medicare Other | Source: Ambulatory Visit | Attending: Specialist | Admitting: Specialist

## 2014-05-14 DIAGNOSIS — M1710 Unilateral primary osteoarthritis, unspecified knee: Secondary | ICD-10-CM | POA: Diagnosis present

## 2014-05-14 DIAGNOSIS — Z87891 Personal history of nicotine dependence: Secondary | ICD-10-CM

## 2014-05-14 DIAGNOSIS — I959 Hypotension, unspecified: Secondary | ICD-10-CM | POA: Diagnosis not present

## 2014-05-14 DIAGNOSIS — M25569 Pain in unspecified knee: Secondary | ICD-10-CM | POA: Diagnosis present

## 2014-05-14 DIAGNOSIS — M21869 Other specified acquired deformities of unspecified lower leg: Secondary | ICD-10-CM | POA: Diagnosis present

## 2014-05-14 DIAGNOSIS — M1712 Unilateral primary osteoarthritis, left knee: Secondary | ICD-10-CM

## 2014-05-14 HISTORY — PX: TOTAL KNEE ARTHROPLASTY: SHX125

## 2014-05-14 LAB — TYPE AND SCREEN
ABO/RH(D): B POS
Antibody Screen: NEGATIVE

## 2014-05-14 SURGERY — ARTHROPLASTY, KNEE, TOTAL
Anesthesia: General | Site: Knee | Laterality: Left

## 2014-05-14 MED ORDER — GLYCOPYRROLATE 0.2 MG/ML IJ SOLN
INTRAMUSCULAR | Status: AC
  Filled 2014-05-14: qty 2

## 2014-05-14 MED ORDER — FENTANYL CITRATE 0.05 MG/ML IJ SOLN
INTRAMUSCULAR | Status: AC
  Filled 2014-05-14: qty 2

## 2014-05-14 MED ORDER — PROPOFOL 10 MG/ML IV BOLUS
INTRAVENOUS | Status: DC | PRN
  Administered 2014-05-14: 200 mg via INTRAVENOUS

## 2014-05-14 MED ORDER — OXYCODONE HCL 5 MG PO TABS
5.0000 mg | ORAL_TABLET | ORAL | Status: DC | PRN
  Administered 2014-05-14 – 2014-05-15 (×2): 10 mg via ORAL
  Filled 2014-05-14 (×3): qty 2

## 2014-05-14 MED ORDER — PREDNISONE (PAK) 5 MG PO TABS: 5.0000 mg | ORAL_TABLET | ORAL | Status: DC

## 2014-05-14 MED ORDER — PREDNISONE (PAK) 5 MG PO TABS: 5.0000 mg | ORAL_TABLET | Freq: Four times a day (QID) | ORAL | Status: DC

## 2014-05-14 MED ORDER — HYDROMORPHONE HCL PF 1 MG/ML IJ SOLN
0.5000 mg | INTRAMUSCULAR | Status: DC | PRN
  Administered 2014-05-14 (×2): 0.5 mg via INTRAVENOUS
  Filled 2014-05-14 (×2): qty 1

## 2014-05-14 MED ORDER — ONDANSETRON HCL 4 MG/2ML IJ SOLN
INTRAMUSCULAR | Status: DC | PRN
  Administered 2014-05-14: 4 mg via INTRAVENOUS

## 2014-05-14 MED ORDER — METOCLOPRAMIDE HCL 10 MG PO TABS
5.0000 mg | ORAL_TABLET | Freq: Three times a day (TID) | ORAL | Status: DC | PRN
  Administered 2014-05-16 – 2014-05-17 (×3): 10 mg via ORAL
  Filled 2014-05-14 (×3): qty 1

## 2014-05-14 MED ORDER — MIDAZOLAM HCL 5 MG/5ML IJ SOLN
INTRAMUSCULAR | Status: DC | PRN
  Administered 2014-05-14: 2 mg via INTRAVENOUS

## 2014-05-14 MED ORDER — PREDNISONE (PAK) 5 MG PO TABS: 5.0000 mg | ORAL_TABLET | Freq: Three times a day (TID) | ORAL | Status: DC

## 2014-05-14 MED ORDER — ACETAMINOPHEN 325 MG PO TABS
650.0000 mg | ORAL_TABLET | Freq: Four times a day (QID) | ORAL | Status: DC | PRN
  Administered 2014-05-17 – 2014-05-18 (×3): 650 mg via ORAL
  Filled 2014-05-14 (×3): qty 2

## 2014-05-14 MED ORDER — TAMSULOSIN HCL 0.4 MG PO CAPS: 0.4000 mg | ORAL_CAPSULE | Freq: Every day | ORAL | Status: DC | PRN

## 2014-05-14 MED ORDER — CISATRACURIUM BESYLATE (PF) 10 MG/5ML IV SOLN
INTRAVENOUS | Status: DC | PRN
  Administered 2014-05-14: 6 mg via INTRAVENOUS

## 2014-05-14 MED ORDER — METHOCARBAMOL 500 MG PO TABS
500.0000 mg | ORAL_TABLET | Freq: Four times a day (QID) | ORAL | Status: DC | PRN
  Administered 2014-05-15 – 2014-05-18 (×6): 500 mg via ORAL
  Filled 2014-05-14 (×7): qty 1

## 2014-05-14 MED ORDER — CEFAZOLIN SODIUM-DEXTROSE 2-3 GM-% IV SOLR
2.0000 g | INTRAVENOUS | Status: AC
Start: 2014-05-14 — End: 2014-05-14
  Administered 2014-05-14: 2 g via INTRAVENOUS

## 2014-05-14 MED ORDER — LABETALOL HCL 5 MG/ML IV SOLN
INTRAVENOUS | Status: AC
  Filled 2014-05-14: qty 4

## 2014-05-14 MED ORDER — ONDANSETRON HCL 4 MG PO TABS
4.0000 mg | ORAL_TABLET | Freq: Four times a day (QID) | ORAL | Status: DC | PRN
  Administered 2014-05-16: 4 mg via ORAL

## 2014-05-14 MED ORDER — PROMETHAZINE HCL 25 MG/ML IJ SOLN
12.5000 mg | Freq: Four times a day (QID) | INTRAMUSCULAR | Status: DC | PRN
  Administered 2014-05-15 – 2014-05-16 (×2): 12.5 mg via INTRAVENOUS
  Filled 2014-05-14 (×3): qty 1

## 2014-05-14 MED ORDER — ADULT MULTIVITAMIN W/MINERALS CH
1.0000 | ORAL_TABLET | Freq: Every morning | ORAL | Status: DC
  Administered 2014-05-16 – 2014-05-18 (×3): 1 via ORAL
  Filled 2014-05-14 (×5): qty 1

## 2014-05-14 MED ORDER — FENTANYL CITRATE 0.05 MG/ML IJ SOLN
INTRAMUSCULAR | Status: AC
  Filled 2014-05-14: qty 5

## 2014-05-14 MED ORDER — KCL IN DEXTROSE-NACL 10-5-0.45 MEQ/L-%-% IV SOLN
INTRAVENOUS | Status: AC
  Administered 2014-05-14 – 2014-05-15 (×2): via INTRAVENOUS
  Filled 2014-05-14 (×2): qty 1000

## 2014-05-14 MED ORDER — HYDROMORPHONE HCL PF 1 MG/ML IJ SOLN
0.5000 mg | INTRAMUSCULAR | Status: DC | PRN
  Administered 2014-05-14 (×3): 1 mg via INTRAVENOUS
  Administered 2014-05-15: 0.5 mg via INTRAVENOUS
  Administered 2014-05-15 (×2): 1 mg via INTRAVENOUS
  Administered 2014-05-15: 0.5 mg via INTRAVENOUS
  Administered 2014-05-15: 1 mg via INTRAVENOUS
  Filled 2014-05-14 (×8): qty 1

## 2014-05-14 MED ORDER — DOCUSATE SODIUM 100 MG PO CAPS
100.0000 mg | ORAL_CAPSULE | Freq: Two times a day (BID) | ORAL | Status: DC
  Administered 2014-05-14 – 2014-05-18 (×7): 100 mg via ORAL

## 2014-05-14 MED ORDER — DOCUSATE SODIUM 100 MG PO CAPS: 100.0000 mg | ORAL_CAPSULE | Freq: Two times a day (BID) | ORAL | Status: DC | PRN

## 2014-05-14 MED ORDER — PREDNISONE (PAK) 5 MG PO TABS: 10.0000 mg | ORAL_TABLET | ORAL | Status: DC

## 2014-05-14 MED ORDER — CEFAZOLIN SODIUM-DEXTROSE 2-3 GM-% IV SOLR
2.0000 g | Freq: Four times a day (QID) | INTRAVENOUS | Status: AC
  Administered 2014-05-14 – 2014-05-15 (×3): 2 g via INTRAVENOUS
  Filled 2014-05-14 (×4): qty 50

## 2014-05-14 MED ORDER — NEOSTIGMINE METHYLSULFATE 10 MG/10ML IV SOLN
INTRAVENOUS | Status: DC | PRN
  Administered 2014-05-14: 2.5 mg via INTRAVENOUS

## 2014-05-14 MED ORDER — PROPOFOL 10 MG/ML IV BOLUS
INTRAVENOUS | Status: AC
  Filled 2014-05-14: qty 20

## 2014-05-14 MED ORDER — LABETALOL HCL 5 MG/ML IV SOLN
INTRAVENOUS | Status: DC | PRN
  Administered 2014-05-14: 2.5 mg via INTRAVENOUS

## 2014-05-14 MED ORDER — METHOCARBAMOL 1000 MG/10ML IJ SOLN
500.0000 mg | Freq: Four times a day (QID) | INTRAVENOUS | Status: DC | PRN
  Administered 2014-05-14 (×2): 500 mg via INTRAVENOUS
  Filled 2014-05-14 (×2): qty 5

## 2014-05-14 MED ORDER — ACETAMINOPHEN 10 MG/ML IV SOLN
1000.0000 mg | Freq: Four times a day (QID) | INTRAVENOUS | Status: AC | PRN
  Administered 2014-05-14 (×2): 1000 mg via INTRAVENOUS
  Filled 2014-05-14 (×3): qty 100

## 2014-05-14 MED ORDER — ONDANSETRON HCL 4 MG/2ML IJ SOLN
INTRAMUSCULAR | Status: AC
  Filled 2014-05-14: qty 2

## 2014-05-14 MED ORDER — PHENOL 1.4 % MT LIQD: 1.0000 | OROMUCOSAL | Status: DC | PRN

## 2014-05-14 MED ORDER — PREDNISONE (PAK) 5 MG PO TABS: 10.0000 mg | ORAL_TABLET | Freq: Every evening | ORAL | Status: DC

## 2014-05-14 MED ORDER — CISATRACURIUM BESYLATE 20 MG/10ML IV SOLN
INTRAVENOUS | Status: AC
  Filled 2014-05-14: qty 10

## 2014-05-14 MED ORDER — MIDAZOLAM HCL 2 MG/2ML IJ SOLN
INTRAMUSCULAR | Status: AC
  Filled 2014-05-14: qty 2

## 2014-05-14 MED ORDER — FENTANYL CITRATE 0.05 MG/ML IJ SOLN
INTRAMUSCULAR | Status: DC | PRN
  Administered 2014-05-14: 50 ug via INTRAVENOUS
  Administered 2014-05-14 (×2): 100 ug via INTRAVENOUS
  Administered 2014-05-14 (×2): 50 ug via INTRAVENOUS

## 2014-05-14 MED ORDER — SUCCINYLCHOLINE CHLORIDE 20 MG/ML IJ SOLN
INTRAMUSCULAR | Status: DC | PRN
  Administered 2014-05-14: 100 mg via INTRAVENOUS

## 2014-05-14 MED ORDER — MEPERIDINE HCL 50 MG/ML IJ SOLN: 6.2500 mg | INTRAMUSCULAR | Status: DC | PRN

## 2014-05-14 MED ORDER — ONDANSETRON HCL 4 MG PO TABS
4.0000 mg | ORAL_TABLET | Freq: Four times a day (QID) | ORAL | Status: DC | PRN
  Filled 2014-05-14: qty 1

## 2014-05-14 MED ORDER — RIVAROXABAN 10 MG PO TABS
10.0000 mg | ORAL_TABLET | Freq: Every day | ORAL | Status: DC
Start: 2014-05-14 — End: 2014-06-11

## 2014-05-14 MED ORDER — HYDROMORPHONE HCL PF 1 MG/ML IJ SOLN
0.2500 mg | INTRAMUSCULAR | Status: DC | PRN
  Administered 2014-05-14 (×3): 0.5 mg via INTRAVENOUS

## 2014-05-14 MED ORDER — PANTOPRAZOLE SODIUM 20 MG PO TBEC
20.0000 mg | DELAYED_RELEASE_TABLET | Freq: Every day | ORAL | Status: DC
  Administered 2014-05-15 – 2014-05-18 (×4): 20 mg via ORAL
  Filled 2014-05-14 (×4): qty 1

## 2014-05-14 MED ORDER — FOLIC ACID 1 MG PO TABS
1.0000 mg | ORAL_TABLET | Freq: Every morning | ORAL | Status: DC
  Administered 2014-05-15 – 2014-05-18 (×4): 1 mg via ORAL
  Filled 2014-05-14 (×4): qty 1

## 2014-05-14 MED ORDER — LIDOCAINE HCL (CARDIAC) 20 MG/ML IV SOLN
INTRAVENOUS | Status: AC
  Filled 2014-05-14: qty 5

## 2014-05-14 MED ORDER — HYDROMORPHONE HCL PF 1 MG/ML IJ SOLN
INTRAMUSCULAR | Status: AC
  Administered 2014-05-15: 0.5 mg via INTRAVENOUS
  Filled 2014-05-14: qty 1

## 2014-05-14 MED ORDER — CEFAZOLIN SODIUM-DEXTROSE 2-3 GM-% IV SOLR
INTRAVENOUS | Status: AC
  Filled 2014-05-14: qty 50

## 2014-05-14 MED ORDER — GLYCOPYRROLATE 0.2 MG/ML IJ SOLN
INTRAMUSCULAR | Status: DC | PRN
  Administered 2014-05-14: 0.4 mg via INTRAVENOUS

## 2014-05-14 MED ORDER — ONDANSETRON HCL 4 MG/2ML IJ SOLN
4.0000 mg | Freq: Four times a day (QID) | INTRAMUSCULAR | Status: DC | PRN
  Administered 2014-05-14 – 2014-05-15 (×3): 4 mg via INTRAVENOUS
  Filled 2014-05-14 (×3): qty 2

## 2014-05-14 MED ORDER — SODIUM CHLORIDE 0.9 % IR SOLN
Status: DC | PRN
  Administered 2014-05-14: 09:00:00

## 2014-05-14 MED ORDER — HYDROMORPHONE HCL PF 1 MG/ML IJ SOLN
INTRAMUSCULAR | Status: AC
  Filled 2014-05-14: qty 1

## 2014-05-14 MED ORDER — MENTHOL 3 MG MT LOZG: 1.0000 | LOZENGE | OROMUCOSAL | Status: DC | PRN

## 2014-05-14 MED ORDER — HYDROMORPHONE HCL PF 1 MG/ML IJ SOLN
INTRAMUSCULAR | Status: DC | PRN
  Administered 2014-05-14: 1 mg via INTRAVENOUS

## 2014-05-14 MED ORDER — SODIUM CHLORIDE 0.9 % IR SOLN
Status: DC | PRN
  Administered 2014-05-14: 1000 mL

## 2014-05-14 MED ORDER — PROMETHAZINE HCL 25 MG/ML IJ SOLN: 6.2500 mg | INTRAMUSCULAR | Status: DC | PRN

## 2014-05-14 MED ORDER — RIVAROXABAN 10 MG PO TABS
10.0000 mg | ORAL_TABLET | Freq: Every day | ORAL | Status: DC
  Administered 2014-05-15 – 2014-05-18 (×4): 10 mg via ORAL
  Filled 2014-05-14 (×5): qty 1

## 2014-05-14 MED ORDER — BUPIVACAINE LIPOSOME 1.3 % IJ SUSP
20.0000 mL | Freq: Once | INTRAMUSCULAR | Status: AC
  Administered 2014-05-14: 20 mL
  Filled 2014-05-14: qty 20

## 2014-05-14 MED ORDER — ACETAMINOPHEN 650 MG RE SUPP
650.0000 mg | Freq: Four times a day (QID) | RECTAL | Status: DC | PRN
Start: 2014-05-14 — End: 2014-05-18

## 2014-05-14 MED ORDER — NEOSTIGMINE METHYLSULFATE 10 MG/10ML IV SOLN
INTRAVENOUS | Status: AC
  Filled 2014-05-14: qty 1

## 2014-05-14 MED ORDER — OXYCODONE-ACETAMINOPHEN 5-325 MG PO TABS: 1.0000 | ORAL_TABLET | ORAL | Status: DC | PRN

## 2014-05-14 MED ORDER — LACTATED RINGERS IV SOLN
INTRAVENOUS | Status: DC
  Administered 2014-05-14: 07:00:00 via INTRAVENOUS
  Administered 2014-05-14: 1000 mL via INTRAVENOUS
  Administered 2014-05-14: 08:00:00 via INTRAVENOUS

## 2014-05-14 MED ORDER — METOCLOPRAMIDE HCL 5 MG/ML IJ SOLN
5.0000 mg | Freq: Three times a day (TID) | INTRAMUSCULAR | Status: DC | PRN
  Administered 2014-05-14 (×2): 10 mg via INTRAVENOUS
  Filled 2014-05-14 (×4): qty 2

## 2014-05-14 MED ORDER — FENTANYL CITRATE 0.05 MG/ML IJ SOLN
25.0000 ug | INTRAMUSCULAR | Status: DC | PRN
  Administered 2014-05-14 (×3): 25 ug via INTRAVENOUS
  Administered 2014-05-14: 50 ug via INTRAVENOUS

## 2014-05-14 MED ORDER — CETYLPYRIDINIUM CHLORIDE 0.05 % MT LIQD
7.0000 mL | Freq: Two times a day (BID) | OROMUCOSAL | Status: DC
  Administered 2014-05-14 – 2014-05-18 (×7): 7 mL via OROMUCOSAL

## 2014-05-14 SURGICAL SUPPLY — 72 items
BAG ZIPLOCK 12X15 (MISCELLANEOUS) IMPLANT
BANDAGE ELASTIC 4 VELCRO ST LF (GAUZE/BANDAGES/DRESSINGS) ×3 IMPLANT
BANDAGE ELASTIC 6 VELCRO ST LF (GAUZE/BANDAGES/DRESSINGS) ×3 IMPLANT
BANDAGE ESMARK 6X9 LF (GAUZE/BANDAGES/DRESSINGS) ×1 IMPLANT
BLADE SAG 18X100X1.27 (BLADE) ×3 IMPLANT
BLADE SAW SGTL 13.0X1.19X90.0M (BLADE) ×3 IMPLANT
BNDG ESMARK 6X9 LF (GAUZE/BANDAGES/DRESSINGS) ×3
CAPT RP KNEE ×3 IMPLANT
CEMENT HV SMART SET (Cement) ×3 IMPLANT
CHLORAPREP W/TINT 26ML (MISCELLANEOUS) IMPLANT
CLOSURE WOUND 1/2 X4 (GAUZE/BANDAGES/DRESSINGS)
CLOTH 2% CHLOROHEXIDINE 3PK (PERSONAL CARE ITEMS) ×3 IMPLANT
CUFF TOURN SGL QUICK 34 (TOURNIQUET CUFF) ×2
CUFF TRNQT CYL 34X4X40X1 (TOURNIQUET CUFF) ×1 IMPLANT
DRAPE INCISE IOBAN 66X45 STRL (DRAPES) IMPLANT
DRAPE LG THREE QUARTER DISP (DRAPES) ×3 IMPLANT
DRAPE ORTHO SPLIT 77X108 STRL (DRAPES) ×4
DRAPE POUCH INSTRU U-SHP 10X18 (DRAPES) ×3 IMPLANT
DRAPE SURG ORHT 6 SPLT 77X108 (DRAPES) ×2 IMPLANT
DRAPE U-SHAPE 47X51 STRL (DRAPES) ×3 IMPLANT
DRSG ADAPTIC 3X8 NADH LF (GAUZE/BANDAGES/DRESSINGS) ×3 IMPLANT
DRSG AQUACEL AG ADV 3.5X10 (GAUZE/BANDAGES/DRESSINGS) ×3 IMPLANT
DRSG OPSITE POSTOP 3X4 (GAUZE/BANDAGES/DRESSINGS) ×3 IMPLANT
DRSG PAD ABDOMINAL 8X10 ST (GAUZE/BANDAGES/DRESSINGS) IMPLANT
DRSG TEGADERM 4X4.75 (GAUZE/BANDAGES/DRESSINGS) IMPLANT
DURAPREP 26ML APPLICATOR (WOUND CARE) ×3 IMPLANT
ELECT REM PT RETURN 9FT ADLT (ELECTROSURGICAL) ×3
ELECTRODE REM PT RTRN 9FT ADLT (ELECTROSURGICAL) ×1 IMPLANT
EVACUATOR 1/8 PVC DRAIN (DRAIN) ×3 IMPLANT
FACESHIELD WRAPAROUND (MASK) ×15 IMPLANT
GAUZE SPONGE 2X2 8PLY STRL LF (GAUZE/BANDAGES/DRESSINGS) ×1 IMPLANT
GAUZE SPONGE 4X4 12PLY STRL (GAUZE/BANDAGES/DRESSINGS) IMPLANT
GLOVE BIOGEL PI IND STRL 7.5 (GLOVE) ×1 IMPLANT
GLOVE BIOGEL PI IND STRL 8 (GLOVE) ×1 IMPLANT
GLOVE BIOGEL PI INDICATOR 7.5 (GLOVE) ×2
GLOVE BIOGEL PI INDICATOR 8 (GLOVE) ×2
GLOVE SURG SS PI 7.5 STRL IVOR (GLOVE) ×3 IMPLANT
GLOVE SURG SS PI 8.0 STRL IVOR (GLOVE) ×6 IMPLANT
GOWN STRL REUS W/TWL XL LVL3 (GOWN DISPOSABLE) ×6 IMPLANT
HANDPIECE INTERPULSE COAX TIP (DISPOSABLE) ×2
IMMOBILIZER KNEE 20 (SOFTGOODS) ×6 IMPLANT
IMMOBILIZER KNEE 20 THIGH 36 (SOFTGOODS) ×1 IMPLANT
KIT BASIN OR (CUSTOM PROCEDURE TRAY) ×3 IMPLANT
MANIFOLD NEPTUNE II (INSTRUMENTS) ×3 IMPLANT
NDL SAFETY ECLIPSE 18X1.5 (NEEDLE) ×1 IMPLANT
NEEDLE HYPO 18GX1.5 SHARP (NEEDLE) ×2
NS IRRIG 1000ML POUR BTL (IV SOLUTION) IMPLANT
PACK TOTAL JOINT (CUSTOM PROCEDURE TRAY) ×3 IMPLANT
PADDING CAST COTTON 6X4 STRL (CAST SUPPLIES) IMPLANT
POSITIONER SURGICAL ARM (MISCELLANEOUS) ×3 IMPLANT
SET HNDPC FAN SPRY TIP SCT (DISPOSABLE) ×1 IMPLANT
SLEEVE SURGEON STRL (DRAPES) ×3 IMPLANT
SPONGE GAUZE 2X2 STER 10/PKG (GAUZE/BANDAGES/DRESSINGS) ×2
SPONGE SURGIFOAM ABS GEL 100 (HEMOSTASIS) IMPLANT
STAPLER VISISTAT (STAPLE) ×3 IMPLANT
STRIP CLOSURE SKIN 1/2X4 (GAUZE/BANDAGES/DRESSINGS) IMPLANT
SUCTION FRAZIER 12FR DISP (SUCTIONS) ×3 IMPLANT
SUT BONE WAX W31G (SUTURE) IMPLANT
SUT MNCRL AB 4-0 PS2 18 (SUTURE) IMPLANT
SUT VIC AB 1 CT1 27 (SUTURE) ×2
SUT VIC AB 1 CT1 27XBRD ANTBC (SUTURE) ×1 IMPLANT
SUT VIC AB 2-0 CT1 27 (SUTURE) ×6
SUT VIC AB 2-0 CT1 TAPERPNT 27 (SUTURE) ×3 IMPLANT
SUT VLOC 180 0 24IN GS25 (SUTURE) ×3 IMPLANT
SYRINGE 20CC LL (MISCELLANEOUS) ×3 IMPLANT
TOWEL OR 17X26 10 PK STRL BLUE (TOWEL DISPOSABLE) ×3 IMPLANT
TOWEL OR NON WOVEN STRL DISP B (DISPOSABLE) ×3 IMPLANT
TOWER CARTRIDGE SMART MIX (DISPOSABLE) ×3 IMPLANT
TRAY FOLEY CATH 14FRSI W/METER (CATHETERS) IMPLANT
TRAY FOLEY METER SIL LF 16FR (CATHETERS) ×3 IMPLANT
WATER STERILE IRR 1500ML POUR (IV SOLUTION) ×3 IMPLANT
WRAP KNEE MAXI GEL POST OP (GAUZE/BANDAGES/DRESSINGS) ×3 IMPLANT

## 2014-05-14 NOTE — Anesthesia Preprocedure Evaluation (Addendum)
Anesthesia Evaluation  Patient identified by MRN, date of birth, ID band Patient awake    Reviewed: Allergy & Precautions, H&P , NPO status , Patient's Chart, lab work & pertinent test results  Airway Mallampati: II TM Distance: >3 FB Neck ROM: Full    Dental no notable dental hx. (+) Edentulous Upper, Edentulous Lower   Pulmonary former smoker,  breath sounds clear to auscultation  Pulmonary exam normal       Cardiovascular + Peripheral Vascular Disease Rhythm:Regular Rate:Normal  AAA 3.4 cm   Neuro/Psych 1994 Aneurysm repair TIAnegative psych ROS   GI/Hepatic negative GI ROS, Neg liver ROS,   Endo/Other  negative endocrine ROS  Renal/GU negative Renal ROS  negative genitourinary   Musculoskeletal negative musculoskeletal ROS (+) Arthritis -, Rheumatoid disorders,    Abdominal   Peds negative pediatric ROS (+)  Hematology negative hematology ROS (+)   Anesthesia Other Findings   Reproductive/Obstetrics negative OB ROS                         Anesthesia Physical Anesthesia Plan  ASA: III  Anesthesia Plan: General   Post-op Pain Management:    Induction: Intravenous  Airway Management Planned: Oral ETT  Additional Equipment:   Intra-op Plan:   Post-operative Plan: Extubation in OR  Informed Consent: I have reviewed the patients History and Physical, chart, labs and discussed the procedure including the risks, benefits and alternatives for the proposed anesthesia with the patient or authorized representative who has indicated his/her understanding and acceptance.   Dental advisory given  Plan Discussed with: CRNA  Anesthesia Plan Comments:         Anesthesia Quick Evaluation

## 2014-05-14 NOTE — Brief Op Note (Signed)
05/14/2014  9:29 AM  PATIENT:  Jeffrey Stephens  73 y.o. male  PRE-OPERATIVE DIAGNOSIS:  DEGENERATIVE JOINT DISEASE LEFT KNEE   POST-OPERATIVE DIAGNOSIS:  DEGENERATIVE JOINT DISEASE LEFT KNEE  PROCEDURE:  Procedure(s): LEFT TOTAL KNEE ARTHROPLASTY (Left)  SURGEON:  Surgeon(s) and Role:    * Johnn Hai, MD - Primary  PHYSICIAN ASSISTANT:   ASSISTANTSMancel Bale    ANESTHESIA:   general  EBL:  Total I/O In: 1000 [I.V.:1000] Out: 125 [Urine:125]  BLOOD ADMINISTERED:none  DRAINS: none   LOCAL MEDICATIONS USED:  MARCAINE     SPECIMEN:  No Specimen  DISPOSITION OF SPECIMEN:  N/A  COUNTS:  YES  TOURNIQUET:  * Missing tourniquet times found for documented tourniquets in log:  [id number] *  DICTATION: .Other Dictation: Dictation Number  [id number]  PLAN OF CARE: Admit to inpatient   PATIENT DISPOSITION:  PACU - hemodynamically stable.   Delay start of Pharmacological VTE agent (>24hrs) due to surgical blood loss or risk of bleeding: no

## 2014-05-14 NOTE — Progress Notes (Signed)
PT Cancellation Note  Patient Details Name: Jeffrey Stephens MRN: [id number] DOB: 1941/08/01   Cancelled Treatment:     PT deferred at request of RN 2* pt's pain/nausea.  Will follow in am.   Hemi Chacko 05/14/2014, 3:13 PM

## 2014-05-14 NOTE — Op Note (Signed)
NAMESHANNA, STRENGTH NO.:  [id number]  MEDICAL RECORD NO.:  [id number]  LOCATION:  [facility]                         FACILITY:  [facility]  PHYSICIAN:  Susa Day, M.D.    DATE OF BIRTH:  June 18, 1941  DATE OF PROCEDURE:  05/14/2014 DATE OF DISCHARGE:                              OPERATIVE REPORT   PREOPERATIVE DIAGNOSES:  Degenerative joint disease, end-stage varus deformity of the left knee.  POSTOPERATIVE DIAGNOSES:  Degenerative joint disease, end-stage varus deformity of the left knee.  PROCEDURE PERFORMED:  Right total knee arthroplasty.  COMPONENTS:  DePuy rotating platform, 5 femur 5 tibia, 15 mm insert, 41 patella.  ANESTHESIA:  General.  ASSISTANT:  Nehemiah Massed, PA, who was necessary for assistance, holding, exposure, closure, transporting the patient.  HISTORY:  72, end stage varus deformity, bone on bone, refractory to conservative treatment, affecting his activities of daily living adversely indicated for replacement of the degenerative joint.  Risk and benefits were discussed including bleeding, infection, damage to neurovascular structure, DVT/PE, anesthetic complications, suboptimal range of motion, need for revision technique.  PROCEDURE IN DETAIL:  With the patient in supine position, after induction of adequate general anesthesia, 2 g Kefzol, the left lower extremity was prepped and draped and exsanguinated in usual sterile fashion.  Thigh tourniquet inflated to 300 mmHg.  Midline incision over the patella was made.  Full thickness flaps developed.  Median parapatellar arthrotomy performed.  Patella everted.  Knee flexed. Tricompartmental osteoarthrosis was noted.  We elevated the soft tissues medially, removed osteophytes with a rongeur.  We removed the remnants of medial lateral menisci, cauterized the geniculate and step drill utilized and the femoral canal was irrigated.  Five degree left was used, 11 off the distal femur to a flexion  contracture.  The cut was performed incising the size of femur of the anterior cortex to a 5 was pinned, and performed our anterior and posterior cuts, as well as Chamfer cuts.  This was pinned in 3 degrees of external rotation.  We turned our attention towards the tibia with the defect noted medially, was sized 2 off the medial side, which was the low side, bisecting the ankle parallel to the shaft, slight posterior slope, performed our cut with oscillating saw protecting the soft tissues posteriorly, checked our flexion-extension gaps, there were equivalent with 12.5 mm insert. Turned our attention towards completing the tibia, was sized at a 5 to maximize the coverage just to the medial aspect of the tibial tubercle. Pinned, drilled centrally.  Used punch guide.  Then we completed the femur with a box cut bisecting the canal, pinned, performed a box cut, checked posteriorly, popliteals intact.  Capsule intact, removed osteophytes and no loose bodies.  Copiously irrigated the wound.  We placed a trial to the femur and tibia and we had good flexion, extension, and stability with varus and valgus stressing 0-30 degrees. We then planted the patella, measured with 25, with 15 measured it at 41.  Drilled our PEG holes medializing them.  A trial patella indicated excellent patellofemoral tracking.  All the trials were removed.  We used pulsatile lavage to clean the surfaces fully, flexed the knee, subluxed the tibia,  dried all surfaces thoroughly, mixed cement on the back table, injected into the tibial canal pressurizing it digitally. Impacted our tibial tray, cemented our femur, placed 12.5 insert, and held an axial load throughout the curing of the cement with redundant cement removed, cemented the patella as well.  On curing of the cement, I removed the trial, removed all redundant cement.  Copiously irrigated the wound.  We trialed the 15 which was maximum, which was better.  We used a  15 mm insert, reduced, it had full extension and flexion and good stability with varus and valgus stressing in 0-30 degrees excellent patellofemoral tracking.  Negative anterior drawer.  I then used Exparel infiltrating the periarticular tissues and the periosteum.  Hemovac was placed and brought out through a lateral stab wound in the skin.  With slight flexion, we closed the patellar arthrotomy with 1 Vicryl and a running V-lock subcu with 2-0 and skin was staples.  He had flexion at gravity at 90 degrees.  Negative anterior drawer and excellent patellofemoral tracking.  Tourniquet was deflated and there was adequate revascularization of lower extremity appreciated.  The patient tolerated the procedure well.  There were no complications.  TOURNIQUET TIME:  1 hour and 15 minutes.     Susa Day, M.D.     Geralynn Rile  D:  05/14/2014  T:  05/14/2014  Job:  [id number]

## 2014-05-14 NOTE — Interval H&P Note (Signed)
History and Physical Interval Note:  05/14/2014 7:23 AM  Jeffrey Stephens  has presented today for surgery, with the diagnosis of DJD LEFT KNEE   The various methods of treatment have been discussed with the patient and family. After consideration of risks, benefits and other options for treatment, the patient has consented to  Procedure(s): LEFT TOTAL KNEE ARTHROPLASTY (Left) as a surgical intervention .  The patient's history has been reviewed, patient examined, no change in status, stable for surgery.  I have reviewed the patient's chart and labs.  Questions were answered to the patient's satisfaction.     Jenaya Saar C

## 2014-05-14 NOTE — H&P (View-Only) (Signed)
Subjective: Day of Surgery Procedure(s) (LRB): LEFT TOTAL KNEE ARTHROPLASTY (Left) Patient reports pain as 4 on 0-10 scale.    Objective: Vital signs in last 24 hours: Temp:  [97.6 F (36.4 C)] 97.6 F (36.4 C) (08/20 0525) Pulse Rate:  [97] 97 (08/20 0525) Resp:  [18] 18 (08/20 0525) BP: (133)/(84) 133/84 mmHg (08/20 0525) SpO2:  [94 %] 94 % (08/20 0525) Weight:  [105.49 kg (232 lb 9 oz)] 105.49 kg (232 lb 9 oz) (08/20 0525)  Intake/Output from previous day:   Intake/Output this shift:    No results found for this basename: HGB,  in the last 72 hours No results found for this basename: WBC, RBC, HCT, PLT,  in the last 72 hours No results found for this basename: NA, K, CL, CO2, BUN, CREATININE, GLUCOSE, CALCIUM,  in the last 72 hours No results found for this basename: LABPT, INR,  in the last 72 hours  Neurologically intact Sensation intact distally Intact pulses distally Incision: dressing C/D/I Compartment soft  Assessment/Plan Day of Surgery Procedure(s) (LRB): LEFT TOTAL KNEE ARTHROPLASTY (Left) Much better on Neurontin and decadron Advance diet D/C to home Dosepack Suppository   Dwayna Kentner C 05/14/2014, 7:08 AM

## 2014-05-14 NOTE — Anesthesia Postprocedure Evaluation (Signed)
  Anesthesia Post-op Note  Patient: Jeffrey Stephens  Procedure(s) Performed: Procedure(s) (LRB): LEFT TOTAL KNEE ARTHROPLASTY (Left)  Patient Location: PACU  Anesthesia Type: General  Level of Consciousness: awake and alert   Airway and Oxygen Therapy: Patient Spontanous Breathing  Post-op Pain: mild  Post-op Assessment: Post-op Vital signs reviewed, Patient's Cardiovascular Status Stable, Respiratory Function Stable, Patent Airway and No signs of Nausea or vomiting  Last Vitals:  Filed Vitals:   05/14/14 1223  BP: 122/66  Pulse: 90  Temp: 36.4 C  Resp: 14    Post-op Vital Signs: stable   Complications: No apparent anesthesia complications

## 2014-05-14 NOTE — Progress Notes (Deleted)
Subjective: Day of Surgery Procedure(s) (LRB): LEFT TOTAL KNEE ARTHROPLASTY (Left) Patient reports pain as 4 on 0-10 scale.    Objective: Vital signs in last 24 hours: Temp:  [97.6 F (36.4 C)] 97.6 F (36.4 C) (08/20 0525) Pulse Rate:  [97] 97 (08/20 0525) Resp:  [18] 18 (08/20 0525) BP: (133)/(84) 133/84 mmHg (08/20 0525) SpO2:  [94 %] 94 % (08/20 0525) Weight:  [105.49 kg (232 lb 9 oz)] 105.49 kg (232 lb 9 oz) (08/20 0525)  Intake/Output from previous day:   Intake/Output this shift:    No results found for this basename: HGB,  in the last 72 hours No results found for this basename: WBC, RBC, HCT, PLT,  in the last 72 hours No results found for this basename: NA, K, CL, CO2, BUN, CREATININE, GLUCOSE, CALCIUM,  in the last 72 hours No results found for this basename: LABPT, INR,  in the last 72 hours  Neurologically intact Sensation intact distally Intact pulses distally Incision: dressing C/D/I Compartment soft  Assessment/Plan Day of Surgery Procedure(s) (LRB): LEFT TOTAL KNEE ARTHROPLASTY (Left) Much better on Neurontin and decadron Advance diet D/C to home Dosepack Suppository   Trevion Hoben C 05/14/2014, 7:08 AM

## 2014-05-14 NOTE — Transfer of Care (Signed)
Immediate Anesthesia Transfer of Care Note  Patient: Jeffrey Stephens  Procedure(s) Performed: Procedure(s): LEFT TOTAL KNEE ARTHROPLASTY (Left)  Patient Location: PACU  Anesthesia Type:General  Level of Consciousness: awake, alert  and oriented  Airway & Oxygen Therapy: Patient Spontanous Breathing and Patient connected to face mask oxygen  Post-op Assessment: Report given to PACU RN  Post vital signs: Reviewed and stable  Complications: No apparent anesthesia complications

## 2014-05-15 LAB — BASIC METABOLIC PANEL
ANION GAP: 9 (ref 5–15)
BUN: 9 mg/dL (ref 6–23)
CHLORIDE: 101 meq/L (ref 96–112)
CO2: 27 mEq/L (ref 19–32)
Calcium: 8.5 mg/dL (ref 8.4–10.5)
Creatinine, Ser: 1.03 mg/dL (ref 0.50–1.35)
GFR, EST AFRICAN AMERICAN: 82 mL/min — AB (ref >90)
GFR, EST NON AFRICAN AMERICAN: 71 mL/min — AB (ref >90)
Glucose, Bld: 140 mg/dL — ABNORMAL HIGH (ref 70–99)
POTASSIUM: 4.3 meq/L (ref 3.7–5.3)
SODIUM: 137 meq/L (ref 137–147)

## 2014-05-15 LAB — CBC
HCT: 39.9 % (ref 39.0–52.0)
Hemoglobin: 13.4 g/dL (ref 13.0–17.0)
MCH: 31.2 pg (ref 26.0–34.0)
MCHC: 33.6 g/dL (ref 30.0–36.0)
MCV: 93 fL (ref 78.0–100.0)
Platelets: 175 10*3/uL (ref 150–400)
RBC: 4.29 MIL/uL (ref 4.22–5.81)
RDW: 14.1 % (ref 11.5–15.5)
WBC: 10.8 10*3/uL — ABNORMAL HIGH (ref 4.0–10.5)

## 2014-05-15 MED ORDER — HYDROMORPHONE HCL PF 1 MG/ML IJ SOLN
1.0000 mg | INTRAMUSCULAR | Status: DC | PRN
  Administered 2014-05-15: 1 mg via INTRAVENOUS
  Filled 2014-05-15: qty 1

## 2014-05-15 MED ORDER — OXYCODONE HCL 5 MG PO TABS
5.0000 mg | ORAL_TABLET | ORAL | Status: DC | PRN
  Administered 2014-05-15 – 2014-05-16 (×6): 15 mg via ORAL
  Administered 2014-05-18: 5 mg via ORAL
  Filled 2014-05-15 (×6): qty 3
  Filled 2014-05-15: qty 1

## 2014-05-15 NOTE — Progress Notes (Signed)
CSW consulted for SNF placement. PN reviewed. PT is  recommending HHPT . RNCM will assist with d/c planning.  Werner Lean LCSW [phone]

## 2014-05-15 NOTE — Discharge Instructions (Signed)
Elevate leg above heart 6x a day for 11minutes each Use knee immobilizer while walking until can SLR x 10 Use knee immobilizer in bed to keep knee in extension Daily dressing changes Aquacel dressing may remain in place for 7 days. May shower with aquacel dressing in place. After 7 days, remove aquacel dressing. Do not remove steri-strips if they are present. Place new dressing with gauze and tape or ACE bandage which should be kept clean and dry and changed daily.  Information on my medicine - XARELTO (Rivaroxaban)  This medication education was reviewed with me or my healthcare representative as part of my discharge preparation.  The pharmacist that spoke with me during my hospital stay was:  Emiliano Dyer, RPH  Why was Xarelto prescribed for you? Xarelto was prescribed for you to reduce the risk of blood clots forming after orthopedic surgery. The medical term for these abnormal blood clots is venous thromboembolism (VTE).  What do you need to know about xarelto ? Take your Xarelto ONCE DAILY at the same time every day. You may take it either with or without food.  If you have difficulty swallowing the tablet whole, you may crush it and mix in applesauce just prior to taking your dose.  Take Xarelto exactly as prescribed by your doctor and DO NOT stop taking Xarelto without talking to the doctor who prescribed the medication.  Stopping without other VTE prevention medication to take the place of Xarelto may increase your risk of developing a clot.  After discharge, you should have regular check-up appointments with your healthcare provider that is prescribing your Xarelto.    What do you do if you miss a dose? If you miss a dose, take it as soon as you remember on the same day then continue your regularly scheduled once daily regimen the next day. Do not take two doses of Xarelto on the same day.   Important Safety Information A possible side effect of Xarelto is  bleeding. You should call your healthcare provider right away if you experience any of the following:   Bleeding from an injury or your nose that does not stop.   Unusual colored urine (red or dark brown) or unusual colored stools (red or black).   Unusual bruising for unknown reasons.   A serious fall or if you hit your head (even if there is no bleeding).  Some medicines may interact with Xarelto and might increase your risk of bleeding while on Xarelto. To help avoid this, consult your healthcare provider or pharmacist prior to using any new prescription or non-prescription medications, including herbals, vitamins, non-steroidal anti-inflammatory drugs (NSAIDs) and supplements.  This website has more information on Xarelto: [email or web address]

## 2014-05-15 NOTE — Progress Notes (Signed)
Subjective: 1 Day Post-Op Procedure(s) (LRB): LEFT TOTAL KNEE ARTHROPLASTY (Left) Patient reports pain as 5 on 0-10 scale.   Called to reevaluate   Objective: Vital signs in last 24 hours: Temp:  [97.4 F (36.3 C)-98.7 F (37.1 C)] 97.7 F (36.5 C) (08/21 1314) Pulse Rate:  [76-104] 104 (08/21 1314) Resp:  [16-19] 18 (08/21 1314) BP: (109-133)/(54-69) 117/59 mmHg (08/21 1314) SpO2:  [94 %-99 %] 97 % (08/21 1314)  Intake/Output from previous day: 08/20 0701 - 08/21 0700 In: 4502.5 [P.O.:600; I.V.:3752.5; IV Piggyback:150] Out: 1560 [Urine:1150; Drains:410] Intake/Output this shift: Total I/O In: -  Out: 600 [Urine:600]   Recent Labs  05/15/14 0500  HGB 13.4    Recent Labs  05/15/14 0500  WBC 10.8*  RBC 4.29  HCT 39.9  PLT 175    Recent Labs  05/15/14 0500  NA 137  K 4.3  CL 101  CO2 27  BUN 9  CREATININE 1.03  GLUCOSE 140*  CALCIUM 8.5   No results found for this basename: LABPT, INR,  in the last 72 hours  Neurologically intact Intact pulses distally Compartment soft Moderate swelling Assessment/Plan: 1 Day Post-Op Procedure(s) (LRB): LEFT TOTAL KNEE ARTHROPLASTY (Left) Up with therapy Hold CPM. elevate Increase oxy and dilaudid.   Sherra Kimmons C 05/15/2014, 1:17 PM

## 2014-05-15 NOTE — Progress Notes (Signed)
OT Cancellation Note  Patient Details Name: Jeffrey Stephens MRN: [id number] DOB: Dec 24, 1940   Cancelled Treatment:    Reason Eval/Treat Not Completed: Other (comment) Pt just finishing with PT and per PT needing increased assist. Will try back in am.  Jules Schick [phone] 05/15/2014, 11:53 AM

## 2014-05-15 NOTE — Care Management Note (Signed)
    Page 1 of 1   05/15/2014     10:13:17 AM CARE MANAGEMENT NOTE 05/15/2014  Patient:  Jeffrey Stephens, Jeffrey Stephens   Account Number:  [id number]  Date Initiated:  05/15/2014  Documentation initiated by:  Sunday Spillers  Subjective/Objective Assessment:   73 yo male admitted s/p right TKA. PTA lived at home.     Action/Plan:   Home when stable   Anticipated DC Date:  05/15/2014   Anticipated DC Plan:  [place]  CM consult      Choice offered to / List presented to:          [facility] arranged  HH-2 PT      [place]   Status of service:  Completed, signed off Medicare Important Message given?  NA - LOS <3 / Initial given by admissions (If response is "NO", the following Medicare IM given date fields will be blank) Date Medicare IM given:   Medicare IM given by:   Date Additional Medicare IM given:   Additional Medicare IM given by:    Discharge Disposition:  [place]  Per UR Regulation:  Reviewed for med. necessity/level of care/duration of stay  If discussed at [place] of Stay Meetings, dates discussed:    Comments:

## 2014-05-15 NOTE — Progress Notes (Signed)
Intermittent Cath for 275 cc urine after pt unable to urinate on his own. Foley discontinued at 1200 and only bladder scanned for 150 cc at 1800. Difficult catheterization with bloody urine and several large blood clots passed. Pt reports he has seen a Dealer in the "[place]" group in the past.

## 2014-05-15 NOTE — Progress Notes (Signed)
Subjective: 1 Day Post-Op Procedure(s) (LRB): LEFT TOTAL KNEE ARTHROPLASTY (Left) Patient reports pain as 5 on 0-10 scale.   Nausea though not last two IV dilaudid No Cp,  SOB. O2 sat good Objective: Vital signs in last 24 hours: Temp:  [97.4 F (36.3 C)-98.7 F (37.1 C)] 98.2 F (36.8 C) (08/21 0549) Pulse Rate:  [76-105] 80 (08/21 0549) Resp:  [12-19] 18 (08/21 0549) BP: (109-160)/(54-95) 131/69 mmHg (08/21 0549) SpO2:  [94 %-99 %] 97 % (08/21 0549)  Intake/Output from previous day: 08/20 0701 - 08/21 0700 In: 4202.5 [P.O.:600; I.V.:3452.5; IV Piggyback:150] Out: 1560 [Urine:1150; Drains:410] Intake/Output this shift:     Recent Labs  05/15/14 0500  HGB 13.4    Recent Labs  05/15/14 0500  WBC 10.8*  RBC 4.29  HCT 39.9  PLT 175    Recent Labs  05/15/14 0500  NA 137  K 4.3  CL 101  CO2 27  BUN 9  CREATININE 1.03  GLUCOSE 140*  CALCIUM 8.5   No results found for this basename: LABPT, INR,  in the last 72 hours  Neurologically intact ABD soft Sensation intact distally Compartment soft  Assessment/Plan: 1 Day Post-Op Procedure(s) (LRB): LEFT TOTAL KNEE ARTHROPLASTY (Left) Advance diet Up with therapy HV D/C'd tip intact Discussed OR  Velina Drollinger C 05/15/2014, 7:28 AM

## 2014-05-15 NOTE — Evaluation (Signed)
Physical Therapy Evaluation Patient Details Name: Jeffrey Stephens MRN: [id number] DOB: 1941/05/20 Today's Date: 05/15/2014   History of Present Illness  L TKR  Clinical Impression  Pt s/p L TKR presents with decreased L LE strength/ROM and post op pain limiting functional mobility.  Pt should progress to d/c home with assist of friends and HHPT follow up.    Follow Up Recommendations Home health PT    Equipment Recommendations  None recommended by PT    Recommendations for Other Services OT consult     Precautions / Restrictions Precautions Precautions: Fall;Knee Required Braces or Orthoses: Knee Immobilizer - Left Knee Immobilizer - Left: Discontinue once straight leg raise with < 10 degree lag Restrictions Weight Bearing Restrictions: No Other Position/Activity Restrictions: WBAT      Mobility  Bed Mobility Overal bed mobility: +2 for physical assistance;Needs Assistance Bed Mobility: Supine to Sit     Supine to sit: Mod assist;+2 for physical assistance     General bed mobility comments: cues for sequence and use of R LE to self assist  Transfers Overall transfer level: Needs assistance Equipment used: Rolling walker (2 wheeled) Transfers: Sit to/from Stand Sit to Stand: +2 physical assistance;Mod assist         General transfer comment: cues for LE management and use of UEs to self assist  Ambulation/Gait Ambulation/Gait assistance: Mod assist;+2 physical assistance Ambulation Distance (Feet): 9 Feet Assistive device: Rolling walker (2 wheeled) Gait Pattern/deviations: Step-to pattern;Decreased step length - right;Decreased step length - left;Shuffle;Trunk flexed Gait velocity: decr   General Gait Details: cues for posture, sequence and position from [employer] Mobility    Modified Rankin (Stroke Patients Only)       Balance                                             Pertinent Vitals/Pain  Pain Assessment: 0-10 Pain Score: 7  Pain Location: L knee Pain Descriptors / Indicators: Aching;Sore;Throbbing Pain Intervention(s): Limited activity within patient's tolerance;Monitored during session;Premedicated before session;Ice applied    Home Living Family/patient expects to be discharged to:: Private residence Living Arrangements: Alone Available Help at Discharge: Friend(s) Type of Home: House Home Access: Stairs to enter Entrance Stairs-Rails: Psychiatric nurse of Steps: 3 Home Layout: One level Home Equipment: Environmental consultant - 2 wheels;Cane - single point;Crutches Additional Comments: Threasa Beards friend will be assisting    Prior Function Level of Independence: Independent               Hand Dominance   Dominant Hand: Right    Extremity/Trunk Assessment   Upper Extremity Assessment: Overall WFL for tasks assessed           Lower Extremity Assessment: LLE deficits/detail   LLE Deficits / Details: 2/5 quads with AAROM at L knee -12 - 25 - pain limited with ++ muscle guarding  Cervical / Trunk Assessment: Normal  Communication   Communication: No difficulties  Cognition Arousal/Alertness: Awake/alert Behavior During Therapy: WFL for tasks assessed/performed Overall Cognitive Status: Within Functional Limits for tasks assessed                      General Comments      Exercises Total Joint Exercises Ankle Circles/Pumps: AROM;Both;10 reps;Supine Quad Sets: AROM;Both;10 reps;Supine Heel Slides: AAROM;Left;10 reps;Supine  Hip ABduction/ADduction: AAROM;Left;10 reps;Supine      Assessment/Plan    PT Assessment Patient needs continued PT services  PT Diagnosis Difficulty walking   PT Problem List Decreased strength;Decreased range of motion;Decreased activity tolerance;Decreased mobility;Decreased knowledge of use of DME;Pain  PT Treatment Interventions DME instruction;Gait training;Stair training;Functional mobility  training;Therapeutic activities;Therapeutic exercise;Patient/family education   PT Goals (Current goals can be found in the Care Plan section) Acute Rehab PT Goals Patient Stated Goal: Resume previous lifestyle with decreased pain PT Goal Formulation: With patient Time For Goal Achievement: 05/22/14 Potential to Achieve Goals: Good    Frequency 7X/week   Barriers to discharge        Co-evaluation               End of Session Equipment Utilized During Treatment: Gait belt;Left knee immobilizer Activity Tolerance: Patient limited by pain;Patient limited by fatigue Patient left: in chair;with call bell/phone within reach;with family/visitor present Nurse Communication: Mobility status         Time: 8299-3716 PT Time Calculation (min): 28 min   Charges:   PT Evaluation $Initial PT Evaluation Tier I: 1 Procedure PT Treatments $Gait Training: 8-22 mins $Therapeutic Exercise: 8-22 mins   PT G Codes:          Rafay Dahan 05/15/2014, 1:16 PM

## 2014-05-15 NOTE — Progress Notes (Signed)
Physical Therapy Treatment Patient Details Name: Jeffrey Stephens MRN: [id number] DOB: 07-24-1941 Today's Date: 05/24/14    History of Present Illness L TKR    PT Comments    Pt continues pain limited.  Follow Up Recommendations  Home health PT     Equipment Recommendations  None recommended by PT    Recommendations for Other Services OT consult     Precautions / Restrictions Precautions Precautions: Fall;Knee Required Braces or Orthoses: Knee Immobilizer - Left Knee Immobilizer - Left: Discontinue once straight leg raise with < 10 degree lag Restrictions Weight Bearing Restrictions: No Other Position/Activity Restrictions: WBAT    Mobility  Bed Mobility Overal bed mobility: +2 for physical assistance;Needs Assistance Bed Mobility: Sit to Supine       Sit to supine: Mod assist   General bed mobility comments: cues for sequence and use of R LE to self assist  Transfers Overall transfer level: Needs assistance Equipment used: Rolling walker (2 wheeled) Transfers: Sit to/from Stand Sit to Stand: +2 physical assistance;Mod assist         General transfer comment: cues for LE management and use of UEs to self assist  Ambulation/Gait Ambulation/Gait assistance: Mod assist;+2 physical assistance Ambulation Distance (Feet): 4 Feet Assistive device: Rolling walker (2 wheeled) Gait Pattern/deviations: Step-to pattern;Decreased step length - right;Decreased step length - left;Shuffle;Trunk flexed Gait velocity: decr   General Gait Details: cues for posture, sequence and position from [employer] Mobility    Modified Rankin (Stroke Patients Only)       Balance                                    Cognition Arousal/Alertness: Awake/alert Behavior During Therapy: WFL for tasks assessed/performed Overall Cognitive Status: Within Functional Limits for tasks assessed                      Exercises      General Comments        Pertinent Vitals/Pain Pain Assessment: 0-10 Pain Score: 7  Pain Location: L knee Pain Descriptors / Indicators: Aching;Burning;Sore;Throbbing Pain Intervention(s): Limited activity within patient's tolerance;Monitored during session;Premedicated before session;Ice applied    Home Living                      Prior Function            PT Goals (current goals can now be found in the care plan section) Acute Rehab PT Goals Patient Stated Goal: Resume previous lifestyle with decreased pain PT Goal Formulation: With patient Time For Goal Achievement: 05/22/14 Potential to Achieve Goals: Good Progress towards PT goals: Progressing toward goals    Frequency  7X/week    PT Plan Current plan remains appropriate    Co-evaluation             End of Session Equipment Utilized During Treatment: Gait belt;Left knee immobilizer Activity Tolerance: Patient limited by pain;Patient limited by fatigue Patient left: in bed;with call bell/phone within reach;with family/visitor present     Time: 8144-8185 PT Time Calculation (min): 18 min  Charges:  $Gait Training: 8-22 mins                    G Codes:      Jeffrey Stephens 24-May-2014, 3:24 PM

## 2014-05-16 LAB — CBC
HCT: 36 % — ABNORMAL LOW (ref 39.0–52.0)
HEMOGLOBIN: 12.5 g/dL — AB (ref 13.0–17.0)
MCH: 32.4 pg (ref 26.0–34.0)
MCHC: 34.7 g/dL (ref 30.0–36.0)
MCV: 93.3 fL (ref 78.0–100.0)
PLATELETS: 160 10*3/uL (ref 150–400)
RBC: 3.86 MIL/uL — ABNORMAL LOW (ref 4.22–5.81)
RDW: 14.1 % (ref 11.5–15.5)
WBC: 13.1 10*3/uL — ABNORMAL HIGH (ref 4.0–10.5)

## 2014-05-16 MED ORDER — ALUM & MAG HYDROXIDE-SIMETH 200-200-20 MG/5ML PO SUSP
30.0000 mL | Freq: Four times a day (QID) | ORAL | Status: DC | PRN
  Administered 2014-05-16 – 2014-05-17 (×3): 30 mL via ORAL
  Filled 2014-05-16 (×3): qty 30

## 2014-05-16 NOTE — Progress Notes (Signed)
Physical Therapy Treatment Patient Details Name: Jeffrey Stephens MRN: [id number] DOB: [birth year] Today's Date: 05/16/2014    History of Present Illness L TKR    PT Comments    Pt continues motivated but ltd by pain and fatigue with attempts to mobilize  Follow Up Recommendations  Home health PT     Equipment Recommendations  None recommended by PT    Recommendations for Other Services OT consult     Precautions / Restrictions Precautions Precautions: Fall;Knee Required Braces or Orthoses: Knee Immobilizer - Left Knee Immobilizer - Left: Discontinue once straight leg raise with < 10 degree lag Restrictions Weight Bearing Restrictions: No Other Position/Activity Restrictions: WBAT    Mobility  Bed Mobility Overal bed mobility: +2 for physical assistance;Needs Assistance Bed Mobility: Supine to Sit     Supine to sit: Mod assist;+2 for physical assistance     General bed mobility comments: cues for sequence and use of R LE to self assist  Transfers Overall transfer level: Needs assistance Equipment used: Rolling walker (2 wheeled) Transfers: Sit to/from Stand Sit to Stand: +2 physical assistance;Mod assist         General transfer comment: cues for LE management and use of UEs to self assist  Ambulation/Gait Ambulation/Gait assistance: Mod assist;+2 physical assistance Ambulation Distance (Feet): 12 Feet Assistive device: Rolling walker (2 wheeled) Gait Pattern/deviations: Step-to pattern;Decreased step length - right;Decreased step length - left;Shuffle;Trunk flexed Gait velocity: decr   General Gait Details: cues for posture, sequence and position from [employer]    Modified Rankin (Stroke Patients Only)       Balance                                    Cognition Arousal/Alertness: Awake/alert Behavior During Therapy: WFL for tasks assessed/performed Overall Cognitive Status: Within  Functional Limits for tasks assessed                      Exercises Total Joint Exercises Ankle Circles/Pumps: AROM;Both;10 reps;Supine Quad Sets: AROM;Both;Supine;15 reps Heel Slides: AAROM;Left;10 reps;Supine Straight Leg Raises: AAROM;10 reps;Supine;Left Goniometric ROM: AAROM at L knee -10 - 25 with ++ muscle guarding    General Comments        Pertinent Vitals/Pain Pain Assessment: 0-10 Pain Score: 7  Pain Location: L knee Pain Descriptors / Indicators: Aching Pain Intervention(s): Limited activity within patient's tolerance;Monitored during session;Premedicated before session;Ice applied    Home Living                      Prior Function            PT Goals (current goals can now be found in the care plan section) Acute Rehab PT Goals Patient Stated Goal: Resume previous lifestyle with decreased pain PT Goal Formulation: With patient Time For Goal Achievement: 05/22/14 Potential to Achieve Goals: Good Progress towards PT goals: Progressing toward goals    Frequency  7X/week    PT Plan Current plan remains appropriate    Co-evaluation             End of Session Equipment Utilized During Treatment: Gait belt;Left knee immobilizer Activity Tolerance: Patient limited by pain;Patient limited by fatigue Patient left: in chair;with call bell/phone within reach;with family/visitor present     Time: 1540-0867 PT Time Calculation (  min): 32 min  Charges:  $Gait Training: 8-22 mins $Therapeutic Exercise: 8-22 mins                    G Codes:      Kazim Corrales 06-14-14, 11:45 AM

## 2014-05-16 NOTE — Progress Notes (Signed)
   Subjective: 2 Days Post-Op Procedure(s) (LRB): LEFT TOTAL KNEE ARTHROPLASTY (Left)  Pt c/o difficulty lifting his left leg due to pain Continued moderate pain in the left knee/leg Patient reports pain as moderate.  Objective:   VITALS:   Filed Vitals:   05/16/14 0603  BP: 128/63  Pulse: 94  Temp: 98.6 F (37 C)  Resp: 17    Left knee incision healing well nv intact distally No rashes or edema  LABS  Recent Labs  05/15/14 0500 05/16/14 0517  HGB 13.4 12.5*  HCT 39.9 36.0*  WBC 10.8* 13.1*  PLT 175 160     Recent Labs  05/15/14 0500  NA 137  K 4.3  BUN 9  CREATININE 1.03  GLUCOSE 140*     Assessment/Plan: 2 Days Post-Op Procedure(s) (LRB): LEFT TOTAL KNEE ARTHROPLASTY (Left) Continue PT/OT Possible d/c tomorrow vs Monday depending on progress Pain control      Brad Amalie Koran, MPAS, PA-C  05/16/2014, 8:52 AM

## 2014-05-16 NOTE — Progress Notes (Signed)
Physical Therapy Treatment Patient Details Name: Jeffrey Stephens MRN: [id number] DOB: 05-13-1941 Today's Date: May 17, 2014    History of Present Illness L TKR    PT Comments    Pt pleasant and cooperative but ltd this pm by fatigue and N&V.  Follow Up Recommendations  Home health PT     Equipment Recommendations  None recommended by PT    Recommendations for Other Services OT consult     Precautions / Restrictions Precautions Precautions: Fall;Knee Required Braces or Orthoses: Knee Immobilizer - Left Knee Immobilizer - Left: Discontinue once straight leg raise with < 10 degree lag Restrictions Weight Bearing Restrictions: No Other Position/Activity Restrictions: WBAT    Mobility  Bed Mobility Overal bed mobility: +2 for physical assistance;Needs Assistance Bed Mobility: Sit to Supine       Sit to supine: Mod assist;+2 for physical assistance;+2 for safety/equipment   General bed mobility comments: cues for sequence and use of R LE to self assist  Transfers Overall transfer level: Needs assistance Equipment used: Rolling walker (2 wheeled) Transfers: Sit to/from Stand Sit to Stand: +2 physical assistance;Mod assist         General transfer comment: cues for LE management and use of UEs to self assist  Ambulation/Gait Ambulation/Gait assistance: Mod assist;+2 physical assistance;+2 safety/equipment Ambulation Distance (Feet): 5 Feet Assistive device: Rolling walker (2 wheeled) Gait Pattern/deviations: Step-to pattern;Decreased step length - right;Decreased step length - left;Shuffle;Trunk flexed Gait velocity: decr   General Gait Details: cues for posture, sequence and position from [employer]    Modified Rankin (Stroke Patients Only)       Balance                                    Cognition Arousal/Alertness: Awake/alert Behavior During Therapy: WFL for tasks assessed/performed Overall  Cognitive Status: Within Functional Limits for tasks assessed                      Exercises      General Comments        Pertinent Vitals/Pain Pain Assessment: 0-10 Pain Score: 6  Pain Location: L knee    Home Living                      Prior Function            PT Goals (current goals can now be found in the care plan section) Acute Rehab PT Goals Patient Stated Goal: Resume previous lifestyle with decreased pain PT Goal Formulation: With patient Time For Goal Achievement: 05/22/14 Potential to Achieve Goals: Good Progress towards PT goals: Progressing toward goals    Frequency  7X/week    PT Plan Current plan remains appropriate    Co-evaluation             End of Session Equipment Utilized During Treatment: Gait belt;Left knee immobilizer Activity Tolerance: Patient limited by fatigue;Patient limited by pain;Other (comment) (N&V) Patient left: in bed;with call bell/phone within reach;with family/visitor present     Time: 1430-1447 PT Time Calculation (min): 17 min  Charges:  $Therapeutic Activity: 8-22 mins                    G Codes:      Kitrina Maurin 05/17/14, 4:33 PM

## 2014-05-16 NOTE — Progress Notes (Signed)
OT Cancellation Note  Patient Details Name: MORAD TAL MRN: [id number] DOB: 08/17/1941   Cancelled Treatment:    Reason Eval/Treat Not Completed: Other (comment) Spoke with PT  Who felt pt not ready for OT this day. Will recheck on pt in the morning. Thanks, Kari Baars, OT  Lin Landsman, Edwena Felty D 05/16/2014, 12:15 PM

## 2014-05-17 LAB — CBC
HCT: 34.2 % — ABNORMAL LOW (ref 39.0–52.0)
Hemoglobin: 11.9 g/dL — ABNORMAL LOW (ref 13.0–17.0)
MCH: 32 pg (ref 26.0–34.0)
MCHC: 34.8 g/dL (ref 30.0–36.0)
MCV: 91.9 fL (ref 78.0–100.0)
Platelets: 155 10*3/uL (ref 150–400)
RBC: 3.72 MIL/uL — AB (ref 4.22–5.81)
RDW: 13.6 % (ref 11.5–15.5)
WBC: 10.2 10*3/uL (ref 4.0–10.5)

## 2014-05-17 MED ORDER — SODIUM CHLORIDE 0.9 % IV BOLUS (SEPSIS)
250.0000 mL | Freq: Once | INTRAVENOUS | Status: AC
  Administered 2014-05-17: 250 mL via INTRAVENOUS

## 2014-05-17 MED ORDER — SENNA 8.6 MG PO TABS
1.0000 | ORAL_TABLET | Freq: Every day | ORAL | Status: DC | PRN
  Administered 2014-05-17: 8.6 mg via ORAL

## 2014-05-17 MED ORDER — POLYETHYLENE GLYCOL 3350 17 G PO PACK
17.0000 g | PACK | Freq: Every day | ORAL | Status: DC | PRN
  Administered 2014-05-17: 17 g via ORAL

## 2014-05-17 NOTE — Progress Notes (Signed)
Physical Therapy Treatment Patient Details Name: Jeffrey Stephens MRN: [id number] DOB: 09-20-1941 Today's Date: 05/17/2014    History of Present Illness L TKR    PT Comments    POD # 3 am session.  Pt progressing slowly with issues of nausea prior and now low BP.  Sitting EOB BP was 109/70, standing BP 81/48.  Reported to MD who was in the hallway at that time.  Amb distance was limited and recliner brought to pt.  Will re attempt later today.  Follow Up Recommendations  Home health PT     Equipment Recommendations       Recommendations for Other Services       Precautions / Restrictions Precautions Precautions: Fall;Knee Precaution Comments: instructed pt on KI use for amb Required Braces or Orthoses: Knee Immobilizer - Left Knee Immobilizer - Left: Discontinue once straight leg raise with < 10 degree lag Restrictions Weight Bearing Restrictions: No Other Position/Activity Restrictions: WBAT    Mobility  Bed Mobility Overal bed mobility: Needs Assistance Bed Mobility: Supine to Sit     Supine to sit: Min assist     General bed mobility comments: cues for sequence and use of R LE to self assist and assist to support L LE  Transfers Overall transfer level: Needs assistance Equipment used: Rolling walker (2 wheeled) Transfers: Sit to/from Stand Sit to Stand: +2 safety/equipment;Min assist         General transfer comment: cues for LE management and use of UEs to self assist  Ambulation/Gait Ambulation/Gait assistance: +2 safety/equipment;Min assist Ambulation Distance (Feet): 7 Feet Assistive device: Rolling walker (2 wheeled) Gait Pattern/deviations: Step-to pattern;Trunk flexed;Decreased stance time - left;Decreased step length - right;Decreased step length - left Gait velocity: decreased   General Gait Details: amb distance limited by hypotension and c/o dizziness.  Reported to MD who was in hallway at time.  BP standing was 81/48   Stairs             Wheelchair Mobility    Modified Rankin (Stroke Patients Only)       Balance                                    Cognition                            Exercises      General Comments        Pertinent Vitals/Pain      Home Living                      Prior Function            PT Goals (current goals can now be found in the care plan section) Progress towards PT goals: Progressing toward goals    Frequency  7X/week    PT Plan Current plan remains appropriate    Co-evaluation             End of Session           Time: 0825-0850 PT Time Calculation (min): 25 min  Charges:  $Gait Training: 8-22 mins $Therapeutic Activity: 8-22 mins                    G Codes:      Rica Koyanagi  PTA [facility]  Acute  Rehab Pager  [phone]  

## 2014-05-17 NOTE — Progress Notes (Signed)
OT Cancellation Note  Patient Details Name: FELIS QUILLIN MRN: [id number] DOB: 1941-02-03   Cancelled Treatment:    Reason Eval/Treat Not Completed: Other (comment) Note pt still with difficulty mobilizing and with BP issues. Will hold off on OT and check later time.  [place], [place] 05/17/2014, 10:08 AM

## 2014-05-17 NOTE — Progress Notes (Signed)
CARE MANAGEMENT NOTE 05/17/2014  Patient:  Jeffrey Stephens, Jeffrey Stephens   Account Number:  [id number]  Date Initiated:  05/15/2014  Documentation initiated by:  Sunday Spillers  Subjective/Objective Assessment:   73 yo male admitted s/p right TKA. PTA lived at home.     Action/Plan:   Home when stable   Anticipated DC Date:  05/15/2014   Anticipated DC Plan:  [place]  CM consult      [facility] Choice  HOME HEALTH   Choice offered to / List presented to:  C-1 Patient        [place] arranged  HH-2 PT      [place]   Status of service:  Completed, signed off Medicare Important Message given?  YES (If response is "NO", the following Medicare IM given date fields will be blank) Date Medicare IM given:  05/17/2014 Medicare IM given by:  [facility] Date Additional Medicare IM given:   Additional Medicare IM given by:    Discharge Disposition:  [place]  Per UR Regulation:  Reviewed for med. necessity/level of care/duration of stay  If discussed at [place] of Stay Meetings, dates discussed:    Comments:  05/17/2014 Has RW, crutches and 3n1 at home. [facility] aware of possible dc home today with HH. Jonnie Finner RN CCM Case Mgmt phone [phone]

## 2014-05-17 NOTE — Progress Notes (Signed)
Physical Therapy Treatment Patient Details Name: Jeffrey Stephens MRN: [id number] DOB: [birth year] Today's Date: 05/17/2014    History of Present Illness L TKR    PT Comments    POD # 3 pm session.  Pt received 250 IV fluids.  Pt still c/o "swimmy headed" during gait and became increasingly pale in color.  Recliner brought to him.    Orthostatic BPs  Supine 121/70  Sitting 97/87  Sitting after      min    NT  Standing 89/52  Standing after  2   min 85/50    Follow Up Recommendations  Home health PT     Equipment Recommendations  None recommended by PT    Recommendations for Other Services       Precautions / Restrictions Precautions Precautions: Fall;Knee Precaution Comments: instructed pt on KI use for amb Required Braces or Orthoses: Knee Immobilizer - Left Knee Immobilizer - Left: Discontinue once straight leg raise with < 10 degree lag Restrictions Weight Bearing Restrictions: No Other Position/Activity Restrictions: WBAT    Mobility  Bed Mobility Overal bed mobility: Needs Assistance Bed Mobility: Sit to Supine     Supine to sit: Mod assist     General bed mobility comments: Mod asssit back to bed  Transfers Overall transfer level: Needs assistance Equipment used: Rolling walker (2 wheeled) Transfers: Sit to/from Stand Sit to Stand: Min assist         General transfer comment: cues for LE management and use of UEs to self assist  Ambulation/Gait Ambulation/Gait assistance: Min assist Ambulation Distance (Feet): 4 Feet Assistive device: Rolling walker (2 wheeled) Gait Pattern/deviations: Step-to pattern;Trunk flexed;Decreased step length - right;Decreased step length - left Gait velocity: decreased   General Gait Details: amb distance limited due to c/o feeling "swimmy headed" and hypotension.  reported to Event organiser    Modified Rankin (Stroke Patients Only)       Balance                                     Cognition                            Exercises   Total Knee Replacement TE's 10 reps B LE ankle pumps 10 reps towel squeezes 10 reps knee presses 10 reps heel slides  10 reps SAQ's 10 reps SLR's 10 reps ABD Followed by [facility]     General Comments        Pertinent Vitals/Pain      Home Living                      Prior Function            PT Goals (current goals can now be found in the care plan section) Progress towards PT goals: Progressing toward goals (slowly)    Frequency  7X/week    PT Plan Current plan remains appropriate    Co-evaluation             End of Session Equipment Utilized During Treatment: Gait belt;Left knee immobilizer Activity Tolerance: Patient limited by fatigue;Patient limited by pain;Other (comment) (hypotension) Patient left: in bed;with call bell/phone within reach;with family/visitor present     Time: 5009-3818 PT Time Calculation (min): 40 min  Charges:  $Gait Training: 8-22 mins $Therapeutic Exercise: 8-22 mins $Therapeutic Activity: 8-22 mins                    G Codes:      Rica Koyanagi  PTA [facility]  Acute  Rehab Pager      [phone]

## 2014-05-17 NOTE — Progress Notes (Signed)
   Subjective: 3 Days Post-Op Procedure(s) (LRB): LEFT TOTAL KNEE ARTHROPLASTY (Left)  Pt progressing slowly with therapy C/o left leg feeling very heavy and tough to lift leg leg due to weakness Patient reports pain as moderate.  Objective:   VITALS:   Filed Vitals:   05/17/14 0559  BP: 133/64  Pulse: 99  Temp: 99.2 F (37.3 C)  Resp: 17    Left knee incision healing well nv intact distally No rashes with minimal edema distally  LABS  Recent Labs  05/15/14 0500 05/16/14 0517 05/17/14 0524  HGB 13.4 12.5* 11.9*  HCT 39.9 36.0* 34.2*  WBC 10.8* 13.1* 10.2  PLT 175 160 155     Recent Labs  05/15/14 0500  NA 137  K 4.3  BUN 9  CREATININE 1.03  GLUCOSE 140*     Assessment/Plan: 3 Days Post-Op Procedure(s) (LRB): LEFT TOTAL KNEE ARTHROPLASTY (Left) Continue PT/OT  Possible d/c tomorrow depending on progress Pt is slowly improving but unsafe to discharge today     Merla Riches, MPAS, PA-C  05/17/2014, 8:37 AM

## 2014-05-18 LAB — BASIC METABOLIC PANEL
Anion gap: 9 (ref 5–15)
BUN: 11 mg/dL (ref 6–23)
CHLORIDE: 100 meq/L (ref 96–112)
CO2: 27 mEq/L (ref 19–32)
Calcium: 8.5 mg/dL (ref 8.4–10.5)
Creatinine, Ser: 0.81 mg/dL (ref 0.50–1.35)
GFR calc Af Amer: >90 mL/min (ref >90)
GFR calc non Af Amer: 87 mL/min — ABNORMAL LOW (ref >90)
GLUCOSE: 136 mg/dL — AB (ref 70–99)
POTASSIUM: 4.3 meq/L (ref 3.7–5.3)
SODIUM: 136 meq/L — AB (ref 137–147)

## 2014-05-18 LAB — CBC
HEMATOCRIT: 33.9 % — AB (ref 39.0–52.0)
HEMOGLOBIN: 11.9 g/dL — AB (ref 13.0–17.0)
MCH: 32 pg (ref 26.0–34.0)
MCHC: 35.1 g/dL (ref 30.0–36.0)
MCV: 91.1 fL (ref 78.0–100.0)
Platelets: 180 10*3/uL (ref 150–400)
RBC: 3.72 MIL/uL — AB (ref 4.22–5.81)
RDW: 13.6 % (ref 11.5–15.5)
WBC: 8.9 10*3/uL (ref 4.0–10.5)

## 2014-05-18 NOTE — Progress Notes (Signed)
Subjective: 4 Days Post-Op Procedure(s) (LRB): LEFT TOTAL KNEE ARTHROPLASTY (Left) Patient reports pain as moderate.  Reports pain L knee with movement and weightbearing. PT was limited yesterday as he became hypotensive when getting up. He did take oxycodone occasionally PTA at home without any N/V. No other c/o this AM. He would like to go home today and prefers to go home instead of to a SNF  Objective: Vital signs in last 24 hours: Temp:  [98.1 F (36.7 C)-99.1 F (37.3 C)] 99.1 F (37.3 C) (08/24 0624) Pulse Rate:  [87-107] 88 (08/24 0624) Resp:  [16-18] 16 (08/24 0624) BP: (81-138)/(48-70) 138/70 mmHg (08/24 0624) SpO2:  [89 %-98 %] 95 % (08/24 0624)  Intake/Output from previous day: 08/23 0701 - 08/24 0700 In: 1210 [P.O.:960; IV Piggyback:250] Out: 950 [Urine:950] Intake/Output this shift:     Recent Labs  05/16/14 0517 05/17/14 0524  HGB 12.5* 11.9*    Recent Labs  05/16/14 0517 05/17/14 0524  WBC 13.1* 10.2  RBC 3.86* 3.72*  HCT 36.0* 34.2*  PLT 160 155   No results found for this basename: NA, K, CL, CO2, BUN, CREATININE, GLUCOSE, CALCIUM,  in the last 72 hours No results found for this basename: LABPT, INR,  in the last 72 hours  Neurologically intact ABD soft Neurovascular intact Sensation intact distally Intact pulses distally Dorsiflexion/Plantar flexion intact Incision: dressing C/D/I and no drainage No cellulitis present Compartment soft no calf pain or sign of DVT  Assessment/Plan: 4 Days Post-Op Procedure(s) (LRB): LEFT TOTAL KNEE ARTHROPLASTY (Left) Advance diet Up with therapy D/C IV fluids Possible D/C later today if BP stable and pain well controlled- plan home with HHPT He has tolerated oxycodone at home in the past and if needed would recommend 5mg  dose only (as opposed to 15mg  which he initially received) as needed for pain, discussed with nursing If he remains hypotensive will consider medical consult prior to D/C Will order CBC  & BMP for today Will discuss with Dr. Mliss Fritz, [place]. 05/18/2014, 7:49 AM

## 2014-05-18 NOTE — Evaluation (Signed)
Occupational Therapy Evaluation Patient Details Name: Jeffrey Stephens MRN: [id number] DOB: 1941/02/26 Today's Date: 05/18/2014    History of Present Illness L TKR   Clinical Impression   Pt presents to OT with decreased I with ADL activity and will benefit from skilled OT to increase I with ADL activity and return to PLOF    Follow Up Recommendations  Home health OT;Supervision/Assistance - 24 hour    Equipment Recommendations  3 in 1 bedside comode (wide)       Precautions / Restrictions Precautions Precautions: Fall Required Braces or Orthoses: Knee Immobilizer - Left Knee Immobilizer - Left: Discontinue once straight leg raise with < 10 degree lag Restrictions Weight Bearing Restrictions: No Other Position/Activity Restrictions: WBAT      Mobility Bed Mobility Overal bed mobility: Needs Assistance Bed Mobility: Sit to Supine     Supine to sit: Mod assist     General bed mobility comments: Mod asssit back to bed  Transfers Overall transfer level: Needs assistance Equipment used: Rolling walker (2 wheeled)   Sit to Stand: Min assist         General transfer comment: cues for LE management and use of UEs to self assist         ADL Overall ADL's : Needs assistance/impaired             Lower Body Bathing: Sit to/from stand;Maximal assistance   Upper Body Dressing : Set up;Sitting   Lower Body Dressing: Sit to/from stand;Maximal assistance   Toilet Transfer: BSC;RW;Moderate assistance   Toileting- Clothing Manipulation and Hygiene: Moderate assistance;Sit to/from stand         General ADL Comments: Pts granddaughter will A as able. she is a Marine scientist and very familiar with pt and his needs               Pertinent Vitals/Pain Pain Score: 3  Pain Location: L knee     Hand Dominance Right   Extremity/Trunk Assessment Upper Extremity Assessment Upper Extremity Assessment: Overall WFL for tasks assessed           Communication  Communication Communication: No difficulties   Cognition Arousal/Alertness: Awake/alert Behavior During Therapy: WFL for tasks assessed/performed Overall Cognitive Status: Within Functional Limits for tasks assessed                                Home Living Family/patient expects to be discharged to:: Private residence Living Arrangements: Alone Available Help at Discharge: Friend(s) Type of Home: House Home Access: Stairs to enter [employer] of Steps: 3 Entrance Stairs-Rails: Right;Left Home Layout: One level     Bathroom Shower/Tub: Teacher, early years/pre: Standard     Home Equipment: Environmental consultant - 2 wheels;Cane - single point;Crutches;Bedside commode   Additional Comments: Threasa Beards friend will be assisting      Prior Functioning/Environment Level of Independence: Independent             OT Diagnosis: Generalized weakness   OT Problem List:     OT Treatment/Interventions: Self-care/ADL training;Patient/family education;DME and/or AE instruction    OT Goals(Current goals can be found in the care plan section) Acute Rehab OT Goals Patient Stated Goal: Resume previous lifestyle with decreased pain  OT Frequency: Min 2X/week              End of Session    Activity Tolerance: Patient tolerated treatment well Patient left:  in chair with call  bell and family present   Time: 1015-1040 OT Time Calculation (min): 25 min Charges:  OT General Charges $OT Visit: 1 Procedure OT Evaluation $Initial OT Evaluation Tier I: 1 Procedure OT Treatments $Self Care/Home Management : 8-22 mins G-Codes:    Betsy Pries May 19, 2014, 10:44 AM

## 2014-05-18 NOTE — Progress Notes (Signed)
Physical Therapy Treatment Patient Details Name: Jeffrey Stephens MRN: [id number] DOB: Jul 10, 1941 Today's Date: 05/18/2014    History of Present Illness L TKR    PT Comments    POD # 4 pt ready for D/C to home.  Practiced performing stairs with spouse.  Amb in hallway and assisted to BR.  Instructed on safe handling and all questioned addressed.  BP maintaining therapeutic levels and pt had no c/o dizziness.    Follow Up Recommendations  Home health PT     Equipment Recommendations  None recommended by PT    Recommendations for Other Services       Precautions / Restrictions Precautions Precautions: Fall Precaution Comments: instructed pt/spouse on KI use for amb and for stairs Required Braces or Orthoses: Knee Immobilizer - Right Knee Immobilizer - Left: Discontinue once straight leg raise with < 10 degree lag Restrictions Weight Bearing Restrictions: No Other Position/Activity Restrictions: WBAT    Mobility  Bed Mobility Overal bed mobility: Needs Assistance Bed Mobility: Sit to Supine     Supine to sit: Mod assist     General bed mobility comments: Pt OOB in recliner  Transfers Overall transfer level: Needs assistance Equipment used: Rolling walker (2 wheeled) Transfers: Sit to/from Stand Sit to Stand: Supervision         General transfer comment: VC's on safety as pt is impulsive.  VC's esp with turns and proper walker to self distance.    Ambulation/Gait Ambulation/Gait assistance: Supervision;Min guard Ambulation Distance (Feet): 75 Feet Assistive device: Rolling walker (2 wheeled) Gait Pattern/deviations: Step-to pattern;Trunk flexed Gait velocity: WFL   General Gait Details: VC's on safety as pt is impulsive.  BP maintained therapeutic levels and no c/o dizziness   Stairs Stairs: Yes Stairs assistance: Min guard Stair Management: One rail Right;Step to pattern;Forwards;With crutches Number of Stairs: 4 General stair comments: performed  stairs with spouse using one crutch and one rail and had pt wear his KI for increased safety.  Wheelchair Mobility    Modified Rankin (Stroke Patients Only)       Balance                                    Cognition Arousal/Alertness: Awake/alert Behavior During Therapy: WFL for tasks assessed/performed Overall Cognitive Status: Within Functional Limits for tasks assessed                      Exercises  Pt/spouse instructed on HEP    General Comments        Pertinent Vitals/Pain Pain Score: 3  Pain Location: L knee    Home Living Family/patient expects to be discharged to:: Private residence Living Arrangements: Alone Available Help at Discharge: Friend(s) Type of Home: House Home Access: Stairs to enter Entrance Stairs-Rails: Right;Left Home Layout: One level Home Equipment: Environmental consultant - 2 wheels;Cane - single point;Crutches;Bedside commode Additional Comments: Threasa Beards friend will be assisting    Prior Function Level of Independence: Independent          PT Goals (current goals can now be found in the care plan section) Acute Rehab PT Goals Patient Stated Goal: Resume previous lifestyle with decreased pain Progress towards PT goals: Progressing toward goals    Frequency       PT Plan      Co-evaluation             End of Session Equipment Utilized  During Treatment: Gait belt;Left knee immobilizer Activity Tolerance: Patient tolerated treatment well Patient left: in chair;with call bell/phone within reach;with family/visitor present     Time: 1130-1210 PT Time Calculation (min): 40 min  Charges:  $Gait Training: 8-22 mins $Therapeutic Exercise: 8-22 mins $Self Care/Home Management: 8-22                    G Codes:      Rica Koyanagi  PTA [facility]  Acute  Rehab Pager      [phone]

## 2014-05-21 NOTE — Discharge Summary (Signed)
Physician Discharge Summary   Patient ID: Jeffrey Stephens MRN: [id number] DOB/AGE: [birth year] 73 y.o.  Admit date: 05/14/2014 Discharge date: 05/18/2014  Primary Diagnosis: L knee DJD  Admission Diagnoses:  Past Medical History  Diagnosis Date  . TIA (transient ischemic attack)     [place]  . DVT (deep venous thrombosis) 2001    Hx of Left leg   . Cancer     Skin ca.   Marland Kitchen AAA (abdominal aortic aneurysm)     followed by Dr. Deitra Mayo  . AAA (abdominal aortic aneurysm)   . History of palpitations     OCCASIONAL  . Left knee DJD   . GERD (gastroesophageal reflux disease)   . Constipation   . History of kidney stones   . BPH (benign prostatic hyperplasia)   . Frequency of urination    Discharge Diagnoses:   Active Problems:   Left knee DJD  Estimated body mass index is 33.37 kg/(m^2) as calculated from the following:   Height as of this encounter: '5\' 10"'  (1.778 m).   Weight as of this encounter: 105.49 kg (232 lb 9 oz).  Procedure:  Procedure(s) (LRB): LEFT TOTAL KNEE ARTHROPLASTY (Left)   Consults: None  HPI: see H&P Laboratory Data: Admission on 05/14/2014, Discharged on 05/18/2014  Component Date Value Ref Range Status  . WBC 05/15/2014 10.8* 4.0 - 10.5 K/uL Final  . RBC 05/15/2014 4.29  4.22 - 5.81 MIL/uL Final  . Hemoglobin 05/15/2014 13.4  13.0 - 17.0 g/dL Final  . HCT 05/15/2014 39.9  39.0 - 52.0 % Final  . MCV 05/15/2014 93.0  78.0 - 100.0 fL Final  . MCH 05/15/2014 31.2  26.0 - 34.0 pg Final  . MCHC 05/15/2014 33.6  30.0 - 36.0 g/dL Final  . RDW 05/15/2014 14.1  11.5 - 15.5 % Final  . Platelets 05/15/2014 175  150 - 400 K/uL Final  . Sodium 05/15/2014 137  137 - 147 mEq/L Final  . Potassium 05/15/2014 4.3  3.7 - 5.3 mEq/L Final  . Chloride 05/15/2014 101  96 - 112 mEq/L Final  . CO2 05/15/2014 27  19 - 32 mEq/L Final  . Glucose, Bld 05/15/2014 140* 70 - 99 mg/dL Final  . BUN 05/15/2014 9  6 - 23 mg/dL Final  . Creatinine, Ser  05/15/2014 1.03  0.50 - 1.35 mg/dL Final  . Calcium 05/15/2014 8.5  8.4 - 10.5 mg/dL Final  . GFR calc non Af Amer 05/15/2014 71* >90 mL/min Final  . GFR calc Af Amer 05/15/2014 82* >90 mL/min Final   Comment: (NOTE)                          The eGFR has been calculated using the CKD EPI equation.                          This calculation has not been validated in all clinical situations.                          eGFR's persistently <90 mL/min signify possible Chronic Kidney                          Disease.  . Anion gap 05/15/2014 9  5 - 15 Final  . WBC 05/16/2014 13.1* 4.0 - 10.5 K/uL Final  . RBC 05/16/2014 3.86* 4.22 -  5.81 MIL/uL Final  . Hemoglobin 05/16/2014 12.5* 13.0 - 17.0 g/dL Final  . HCT 05/16/2014 36.0* 39.0 - 52.0 % Final  . MCV 05/16/2014 93.3  78.0 - 100.0 fL Final  . MCH 05/16/2014 32.4  26.0 - 34.0 pg Final  . MCHC 05/16/2014 34.7  30.0 - 36.0 g/dL Final  . RDW 05/16/2014 14.1  11.5 - 15.5 % Final  . Platelets 05/16/2014 160  150 - 400 K/uL Final  . WBC 05/17/2014 10.2  4.0 - 10.5 K/uL Final  . RBC 05/17/2014 3.72* 4.22 - 5.81 MIL/uL Final  . Hemoglobin 05/17/2014 11.9* 13.0 - 17.0 g/dL Final  . HCT 05/17/2014 34.2* 39.0 - 52.0 % Final  . MCV 05/17/2014 91.9  78.0 - 100.0 fL Final  . MCH 05/17/2014 32.0  26.0 - 34.0 pg Final  . MCHC 05/17/2014 34.8  30.0 - 36.0 g/dL Final  . RDW 05/17/2014 13.6  11.5 - 15.5 % Final  . Platelets 05/17/2014 155  150 - 400 K/uL Final  . WBC 05/18/2014 8.9  4.0 - 10.5 K/uL Final  . RBC 05/18/2014 3.72* 4.22 - 5.81 MIL/uL Final  . Hemoglobin 05/18/2014 11.9* 13.0 - 17.0 g/dL Final  . HCT 05/18/2014 33.9* 39.0 - 52.0 % Final  . MCV 05/18/2014 91.1  78.0 - 100.0 fL Final  . MCH 05/18/2014 32.0  26.0 - 34.0 pg Final  . MCHC 05/18/2014 35.1  30.0 - 36.0 g/dL Final  . RDW 05/18/2014 13.6  11.5 - 15.5 % Final  . Platelets 05/18/2014 180  150 - 400 K/uL Final  . Sodium 05/18/2014 136* 137 - 147 mEq/L Final  . Potassium 05/18/2014 4.3  3.7  - 5.3 mEq/L Final  . Chloride 05/18/2014 100  96 - 112 mEq/L Final  . CO2 05/18/2014 27  19 - 32 mEq/L Final  . Glucose, Bld 05/18/2014 136* 70 - 99 mg/dL Final  . BUN 05/18/2014 11  6 - 23 mg/dL Final  . Creatinine, Ser 05/18/2014 0.81  0.50 - 1.35 mg/dL Final  . Calcium 05/18/2014 8.5  8.4 - 10.5 mg/dL Final  . GFR calc non Af Amer 05/18/2014 87* >90 mL/min Final  . GFR calc Af Amer 05/18/2014 >90  >90 mL/min Final   Comment: (NOTE)                          The eGFR has been calculated using the CKD EPI equation.                          This calculation has not been validated in all clinical situations.                          eGFR's persistently <90 mL/min signify possible Chronic Kidney                          Disease.  Georgiann Hahn gap 05/18/2014 9  5 - 15 Final  Hospital Outpatient Visit on 05/07/2014  Component Date Value Ref Range Status  . Sodium 05/07/2014 139  137 - 147 mEq/L Final  . Potassium 05/07/2014 4.5  3.7 - 5.3 mEq/L Final  . Chloride 05/07/2014 104  96 - 112 mEq/L Final  . CO2 05/07/2014 23  19 - 32 mEq/L Final  . Glucose, Bld 05/07/2014 109* 70 - 99 mg/dL Final  . BUN 05/07/2014 14  6 - 23 mg/dL Final  . Creatinine, Ser 05/07/2014 1.01  0.50 - 1.35 mg/dL Final  . Calcium 05/07/2014 9.3  8.4 - 10.5 mg/dL Final  . GFR calc non Af Amer 05/07/2014 73* >90 mL/min Final  . GFR calc Af Amer 05/07/2014 84* >90 mL/min Final   Comment: (NOTE)                          The eGFR has been calculated using the CKD EPI equation.                          This calculation has not been validated in all clinical situations.                          eGFR's persistently <90 mL/min signify possible Chronic Kidney                          Disease.  . Anion gap 05/07/2014 12  5 - 15 Final  . WBC 05/07/2014 6.6  4.0 - 10.5 K/uL Final  . RBC 05/07/2014 4.77  4.22 - 5.81 MIL/uL Final  . Hemoglobin 05/07/2014 15.4  13.0 - 17.0 g/dL Final  . HCT 05/07/2014 43.3  39.0 - 52.0 % Final  . MCV  05/07/2014 90.8  78.0 - 100.0 fL Final  . MCH 05/07/2014 32.3  26.0 - 34.0 pg Final  . MCHC 05/07/2014 35.6  30.0 - 36.0 g/dL Final  . RDW 05/07/2014 13.9  11.5 - 15.5 % Final  . Platelets 05/07/2014 192  150 - 400 K/uL Final  . Prothrombin Time 05/07/2014 13.9  11.6 - 15.2 seconds Final  . INR 05/07/2014 1.07  0.00 - 1.49 Final  . ABO/RH(D) 05/07/2014 B POS   Final  . Antibody Screen 05/07/2014 NEG   Final  . Sample Expiration 05/07/2014 05/17/2014   Final  . Color, Urine 05/07/2014 YELLOW  YELLOW Final  . APPearance 05/07/2014 CLEAR  CLEAR Final  . Specific Gravity, Urine 05/07/2014 1.013  1.005 - 1.030 Final  . pH 05/07/2014 6.5  5.0 - 8.0 Final  . Glucose, UA 05/07/2014 NEGATIVE  NEGATIVE mg/dL Final  . Hgb urine dipstick 05/07/2014 NEGATIVE  NEGATIVE Final  . Bilirubin Urine 05/07/2014 NEGATIVE  NEGATIVE Final  . Ketones, ur 05/07/2014 NEGATIVE  NEGATIVE mg/dL Final  . Protein, ur 05/07/2014 NEGATIVE  NEGATIVE mg/dL Final  . Urobilinogen, UA 05/07/2014 0.2  0.0 - 1.0 mg/dL Final  . Nitrite 05/07/2014 NEGATIVE  NEGATIVE Final  . Leukocytes, UA 05/07/2014 NEGATIVE  NEGATIVE Final   MICROSCOPIC NOT DONE ON URINES WITH NEGATIVE PROTEIN, BLOOD, LEUKOCYTES, NITRITE, OR GLUCOSE <1000 mg/dL.  . ABO/RH(D) 05/07/2014 B POS   Final  . MRSA, PCR 05/07/2014 NEGATIVE  NEGATIVE Final  . Staphylococcus aureus 05/07/2014 NEGATIVE  NEGATIVE Final   Comment:                                 The Xpert SA Assay (FDA                          approved for NASAL specimens                          in  patients over 53 years of age),                          is one component of                          a comprehensive surveillance                          program.  Test performance has                          been validated by [employer] for patients greater                          than or equal to 30 year old.                          It is not intended                           to diagnose infection nor to                          guide or monitor treatment.     X-Rays:Dg Chest 2 View  05/07/2014   CLINICAL DATA:  Preop knee replacement.  EXAM: CHEST  2 VIEW  COMPARISON:  12/16/2003  FINDINGS: Lungs are adequately inflated without consolidation or effusion. There is mild flattening of the hemidiaphragms. Cardiomediastinal silhouette is within normal. There is calcification of the aortic arch as well as degenerative changes of the spine.  IMPRESSION: No active cardiopulmonary disease.   Electronically Signed   By: Marin Olp M.D.   On: 05/07/2014 11:24   Dg Cervical Spine With Flex & Extend  05/07/2014   CLINICAL DATA:  History of rheumatoid arthritis, evaluate for instability  EXAM: CERVICAL SPINE COMPLETE WITH FLEXION AND EXTENSION VIEWS  COMPARISON:  None.  FINDINGS: The cervical vertebrae are in normal alignment. There is degenerative disc disease primarily at C5-6 and C6-7 levels with mild degenerative change also was C4-5 and C7-T1 levels. No prevertebral soft tissue swelling is seen. There is a narrowed atlantoaxial distance measuring only 1.8 mm in the neutral lateral view. On oblique views, there is foraminal narrowing particularly at C5-6 and C6-7 bilaterally. The odontoid process is intact and the lung apices appear clear.  Through flexion and extension there is only slightly limited range of motion. There is no change in the atlantoaxial distance through flexion and extension.  IMPRESSION: 1. No evidence of atlantoaxial instability. The atlantoaxial distance remains stable through flexion and extension. 2. Degenerative disc disease particularly at C5-6 and C6-7 where there is foraminal narrowing bilaterally.   Electronically Signed   By: Ivar Drape M.D.   On: 05/07/2014 11:29   Dg Knee 1-2 Views Left  05/14/2014   CLINICAL DATA:  Postop left total knee arthroplasty  EXAM: LEFT KNEE - 1-2 VIEW  COMPARISON:  Preoperative exam of May 07, 2014  FINDINGS: The  patient has undergone total knee prosthesis placement. Radiographic positioning of the prosthetic components is good. A  surgical drain line as well as skin staples are present.  IMPRESSION: There is no immediate postprocedure complication following left total knee arthroplasty.   Electronically Signed   By: David  Martinique   On: 05/14/2014 13:16   Dg Knee 2 Views Left  05/07/2014   CLINICAL DATA:  Preoperative evaluation for LEFT total knee replacement, an osteoarthritis  EXAM: LEFT KNEE - 1-2 VIEW  COMPARISON:  12/07/2003  FINDINGS: Advanced tricompartmental osteoarthritic changes with joint space narrowing and spur formation, progressive since 2005.  Mild lateral subluxation of tibia.  No acute fracture or dislocation.  No knee joint effusion.  Question calcified loose body in suprapatellar recess 9 mm diameter.  Diffuse osseous demineralization  IMPRESSION: Tricompartmental osteoarthritic changes progressive since 2005.  Suspected 9 mm calcified loose body at suprapatellar recess.  Osseous demineralization.   Electronically Signed   By: Lavonia Dana M.D.   On: 05/07/2014 11:11    EKG: Orders placed during the hospital encounter of 08/29/13  . EKG     Hospital Course: Jeffrey Stephens is a 74 y.o. who was admitted to [facility]. They were brought to the operating room on 05/14/2014 and underwent Procedure(s): LEFT TOTAL KNEE ARTHROPLASTY.  Patient tolerated the procedure well and was later transferred to the recovery room and then to the orthopaedic floor for postoperative care.  They were given PO and IV analgesics for pain control following their surgery.  They were given 24 hours of postoperative antibiotics of  Anti-infectives   Start     Dose/Rate Route Frequency Ordered Stop   05/14/14 1400  ceFAZolin (ANCEF) IVPB 2 g/50 mL premix     2 g 100 mL/hr over 30 Minutes Intravenous Every 6 hours 05/14/14 1120 05/15/14 0254   05/14/14 0914  polymyxin B 500,000 Units, bacitracin 50,000 Units  in sodium chloride irrigation 0.9 % 500 mL irrigation  Status:  Discontinued       As needed 05/14/14 0914 05/14/14 0940   05/14/14 0600  ceFAZolin (ANCEF) IVPB 2 g/50 mL premix     2 g 100 mL/hr over 30 Minutes Intravenous On call to O.R. 05/14/14 0530 05/14/14 0734     and started on DVT prophylaxis in the form of Xarelto, TED hose and SCDs.   PT and OT were ordered for total joint protocol.  Discharge planning consulted to help with postop disposition and equipment needs.  Patient had a fair night on the evening of surgery.  They started to get up OOB with therapy on day one. Hemovac drain was pulled without difficulty.  Continued to work with therapy into day two.  By day four, the patient had progressed with therapy and meeting their goals.  Incision was healing well.  Patient was seen in rounds and was ready to go home.   Diet: Low sodium heart healthy Activity:WBAT Follow-up:in 10 days Disposition - Home Discharged Condition: good   Discharge Instructions   Call MD / Call 911    Complete by:  As directed   If you experience chest pain or shortness of breath, CALL 911 and be transported to the hospital emergency room.  If you develope a fever above 101 F, pus (white drainage) or increased drainage or redness at the wound, or calf pain, call your surgeon's office.     Constipation Prevention    Complete by:  As directed   Drink plenty of fluids.  Prune juice may be helpful.  You may use a stool softener, such as Colace (  over the counter) 100 mg twice a day.  Use MiraLax (over the counter) for constipation as needed.     Diet - low sodium heart healthy    Complete by:  As directed      Increase activity slowly as tolerated    Complete by:  As directed             Medication List    STOP taking these medications       aspirin EC 81 MG tablet     methotrexate 2.5 MG tablet  Commonly known as:  RHEUMATREX     multivitamin with minerals Tabs tablet      TAKE these  medications       docusate sodium 100 MG capsule  Commonly known as:  COLACE  Take 1 capsule (100 mg total) by mouth 2 (two) times daily as needed for mild constipation.     folic acid 1 MG tablet  Commonly known as:  FOLVITE  Take 1 mg by mouth every morning.     lansoprazole 30 MG capsule  Commonly known as:  PREVACID  Take 30 mg by mouth every morning.     ondansetron 4 MG tablet  Commonly known as:  ZOFRAN  Take 4 mg by mouth every 6 (six) hours as needed for nausea or vomiting.     oxyCODONE-acetaminophen 5-325 MG per tablet  Commonly known as:  PERCOCET  Take 1-2 tablets by mouth every 4 (four) hours as needed.     rivaroxaban 10 MG Tabs tablet  Commonly known as:  XARELTO  Take 1 tablet (10 mg total) by mouth daily.     tamsulosin 0.4 MG Caps capsule  Commonly known as:  FLOMAX  Take 0.4 mg by mouth daily as needed (For problems with urination caused by an enlarged prostate.).           Follow-up Information   Follow up with Johnn Hai, MD.   Specialty:  Orthopedic Surgery   Contact information:   [address] [place] [address] [phone]       Follow up with [facility]. [facility])    Contact information:   [address] [place] [address] [phone]       Signed: Lacie Draft PA-C for Dr. Tonita Cong Orthopaedic Surgery 05/21/2014, 10:05 AM

## 2014-06-11 ENCOUNTER — Encounter (HOSPITAL_COMMUNITY): Payer: Self-pay | Admitting: Emergency Medicine

## 2014-06-11 ENCOUNTER — Observation Stay (HOSPITAL_COMMUNITY)
Admission: EM | Admit: 2014-06-11 | Discharge: 2014-06-12 | Disposition: A | Discharge status: Home / Self Care | Payer: Medicare Other | Attending: Cardiovascular Disease | Admitting: Cardiovascular Disease

## 2014-06-11 DIAGNOSIS — Z85828 Personal history of other malignant neoplasm of skin: Secondary | ICD-10-CM | POA: Insufficient documentation

## 2014-06-11 DIAGNOSIS — Z8673 Personal history of transient ischemic attack (TIA), and cerebral infarction without residual deficits: Secondary | ICD-10-CM | POA: Diagnosis not present

## 2014-06-11 DIAGNOSIS — K219 Gastro-esophageal reflux disease without esophagitis: Secondary | ICD-10-CM | POA: Insufficient documentation

## 2014-06-11 DIAGNOSIS — I4892 Unspecified atrial flutter: Secondary | ICD-10-CM | POA: Diagnosis not present

## 2014-06-11 DIAGNOSIS — Z87442 Personal history of urinary calculi: Secondary | ICD-10-CM | POA: Insufficient documentation

## 2014-06-11 DIAGNOSIS — K5900 Constipation, unspecified: Secondary | ICD-10-CM | POA: Diagnosis not present

## 2014-06-11 DIAGNOSIS — Z87891 Personal history of nicotine dependence: Secondary | ICD-10-CM | POA: Diagnosis not present

## 2014-06-11 DIAGNOSIS — M1710 Unilateral primary osteoarthritis, unspecified knee: Secondary | ICD-10-CM | POA: Insufficient documentation

## 2014-06-11 DIAGNOSIS — M069 Rheumatoid arthritis, unspecified: Secondary | ICD-10-CM | POA: Insufficient documentation

## 2014-06-11 DIAGNOSIS — Z79899 Other long term (current) drug therapy: Secondary | ICD-10-CM | POA: Diagnosis not present

## 2014-06-11 DIAGNOSIS — IMO0002 Reserved for concepts with insufficient information to code with codable children: Secondary | ICD-10-CM | POA: Insufficient documentation

## 2014-06-11 DIAGNOSIS — R000 Tachycardia, unspecified: Secondary | ICD-10-CM | POA: Insufficient documentation

## 2014-06-11 DIAGNOSIS — N400 Enlarged prostate without lower urinary tract symptoms: Secondary | ICD-10-CM | POA: Insufficient documentation

## 2014-06-11 DIAGNOSIS — Z87898 Personal history of other specified conditions: Secondary | ICD-10-CM | POA: Diagnosis present

## 2014-06-11 DIAGNOSIS — R918 Other nonspecific abnormal finding of lung field: Secondary | ICD-10-CM | POA: Diagnosis present

## 2014-06-11 DIAGNOSIS — Z86718 Personal history of other venous thrombosis and embolism: Secondary | ICD-10-CM | POA: Insufficient documentation

## 2014-06-11 DIAGNOSIS — I714 Abdominal aortic aneurysm, without rupture, unspecified: Secondary | ICD-10-CM | POA: Diagnosis not present

## 2014-06-11 DIAGNOSIS — I517 Cardiomegaly: Secondary | ICD-10-CM

## 2014-06-11 DIAGNOSIS — M1712 Unilateral primary osteoarthritis, left knee: Secondary | ICD-10-CM | POA: Diagnosis present

## 2014-06-11 DIAGNOSIS — J984 Other disorders of lung: Secondary | ICD-10-CM | POA: Diagnosis present

## 2014-06-11 HISTORY — DX: Rheumatoid arthritis, unspecified: M06.9

## 2014-06-11 HISTORY — DX: Cerebral aneurysm, nonruptured: I67.1

## 2014-06-11 HISTORY — DX: Personal history of other medical treatment: Z92.89

## 2014-06-11 HISTORY — DX: Cerebral infarction, unspecified: I63.9

## 2014-06-11 HISTORY — DX: Basal cell carcinoma of skin, unspecified: C44.91

## 2014-06-11 HISTORY — DX: Calculus of kidney: N20.0

## 2014-06-11 LAB — CBC
HEMATOCRIT: 43.6 % (ref 39.0–52.0)
Hemoglobin: 14.7 g/dL (ref 13.0–17.0)
MCH: 31.5 pg (ref 26.0–34.0)
MCHC: 33.7 g/dL (ref 30.0–36.0)
MCV: 93.6 fL (ref 78.0–100.0)
Platelets: 244 10*3/uL (ref 150–400)
RBC: 4.66 MIL/uL (ref 4.22–5.81)
RDW: 14 % (ref 11.5–15.5)
WBC: 8.6 10*3/uL (ref 4.0–10.5)

## 2014-06-11 LAB — BASIC METABOLIC PANEL
Anion gap: 12 (ref 5–15)
BUN: 12 mg/dL (ref 6–23)
CO2: 25 meq/L (ref 19–32)
Calcium: 9.1 mg/dL (ref 8.4–10.5)
Chloride: 104 mEq/L (ref 96–112)
Creatinine, Ser: 1.1 mg/dL (ref 0.50–1.35)
GFR calc Af Amer: 76 mL/min — ABNORMAL LOW (ref >90)
GFR calc non Af Amer: 66 mL/min — ABNORMAL LOW (ref >90)
GLUCOSE: 95 mg/dL (ref 70–99)
POTASSIUM: 4.8 meq/L (ref 3.7–5.3)
SODIUM: 141 meq/L (ref 137–147)

## 2014-06-11 LAB — I-STAT TROPONIN, ED: TROPONIN I, POC: 0.01 ng/mL (ref 0.00–0.08)

## 2014-06-11 LAB — MAGNESIUM: MAGNESIUM: 2.1 mg/dL (ref 1.5–2.5)

## 2014-06-11 LAB — TROPONIN I
Troponin I: <0.3 ng/mL (ref <0.30)
Troponin I: <0.3 ng/mL (ref <0.30)

## 2014-06-11 LAB — I-STAT CG4 LACTIC ACID, ED: Lactic Acid, Venous: 0.86 mmol/L (ref 0.5–2.2)

## 2014-06-11 LAB — HEMOGLOBIN A1C
HEMOGLOBIN A1C: 5.3 % (ref <5.7)
MEAN PLASMA GLUCOSE: 105 mg/dL (ref <117)

## 2014-06-11 LAB — LACTIC ACID, PLASMA: LACTIC ACID, VENOUS: 1 mmol/L (ref 0.5–2.2)

## 2014-06-11 LAB — TSH: TSH: 1.77 u[IU]/mL (ref 0.350–4.500)

## 2014-06-11 MED ORDER — ACETAMINOPHEN 325 MG PO TABS
650.0000 mg | ORAL_TABLET | ORAL | Status: DC | PRN
Start: 2014-06-11 — End: 2014-06-12

## 2014-06-11 MED ORDER — DILTIAZEM HCL 100 MG IV SOLR
5.0000 mg/h | INTRAVENOUS | Status: DC
  Administered 2014-06-11 – 2014-06-12 (×2): 5 mg/h via INTRAVENOUS
  Filled 2014-06-11: qty 100

## 2014-06-11 MED ORDER — APIXABAN 5 MG PO TABS
5.0000 mg | ORAL_TABLET | Freq: Two times a day (BID) | ORAL | Status: DC
  Administered 2014-06-11 – 2014-06-12 (×3): 5 mg via ORAL
  Filled 2014-06-11 (×5): qty 1

## 2014-06-11 MED ORDER — DILTIAZEM LOAD VIA INFUSION
10.0000 mg | Freq: Once | INTRAVENOUS | Status: AC
  Administered 2014-06-11: 10 mg via INTRAVENOUS
  Filled 2014-06-11: qty 10

## 2014-06-11 MED ORDER — INFLUENZA VAC SPLIT QUAD 0.5 ML IM SUSY
0.5000 mL | PREFILLED_SYRINGE | INTRAMUSCULAR | Status: AC
  Administered 2014-06-12: 0.5 mL via INTRAMUSCULAR
  Filled 2014-06-11: qty 0.5

## 2014-06-11 MED ORDER — PANTOPRAZOLE SODIUM 40 MG PO TBEC
40.0000 mg | DELAYED_RELEASE_TABLET | Freq: Every day | ORAL | Status: DC
  Administered 2014-06-11 – 2014-06-12 (×2): 40 mg via ORAL
  Filled 2014-06-11 (×2): qty 1

## 2014-06-11 MED ORDER — METOPROLOL TARTRATE 25 MG PO TABS
25.0000 mg | ORAL_TABLET | Freq: Two times a day (BID) | ORAL | Status: DC
  Administered 2014-06-11: 25 mg via ORAL
  Filled 2014-06-11: qty 1

## 2014-06-11 MED ORDER — ADENOSINE 6 MG/2ML IV SOLN
6.0000 mg | Freq: Once | INTRAVENOUS | Status: AC
  Administered 2014-06-11: 6 mg via INTRAVENOUS

## 2014-06-11 MED ORDER — ADENOSINE 6 MG/2ML IV SOLN
INTRAVENOUS | Status: AC
  Filled 2014-06-11: qty 6

## 2014-06-11 MED ORDER — OXYCODONE-ACETAMINOPHEN 5-325 MG PO TABS: 1.0000 | ORAL_TABLET | ORAL | Status: DC | PRN

## 2014-06-11 MED ORDER — ONDANSETRON HCL 4 MG/2ML IJ SOLN: 4.0000 mg | Freq: Four times a day (QID) | INTRAMUSCULAR | Status: DC | PRN

## 2014-06-11 MED ORDER — METOPROLOL TARTRATE 12.5 MG HALF TABLET
12.5000 mg | ORAL_TABLET | Freq: Two times a day (BID) | ORAL | Status: DC
  Administered 2014-06-11 – 2014-06-12 (×2): 12.5 mg via ORAL
  Filled 2014-06-11 (×3): qty 1

## 2014-06-11 MED ORDER — FOLIC ACID 1 MG PO TABS
1.0000 mg | ORAL_TABLET | Freq: Every morning | ORAL | Status: DC
  Administered 2014-06-12: 1 mg via ORAL
  Filled 2014-06-11: qty 1

## 2014-06-11 MED ORDER — DOCUSATE SODIUM 100 MG PO CAPS
100.0000 mg | ORAL_CAPSULE | Freq: Two times a day (BID) | ORAL | Status: DC | PRN
  Filled 2014-06-11: qty 1

## 2014-06-11 NOTE — ED Notes (Signed)
Echo at bedside

## 2014-06-11 NOTE — ED Notes (Addendum)
Cardiologist at bedside, sts the pt is allowed to eat at this time. Food tray ordered.

## 2014-06-11 NOTE — Progress Notes (Signed)
  Echocardiogram 2D Echocardiogram has been performed.  Donata Clay 06/11/2014, 4:52 PM

## 2014-06-11 NOTE — ED Notes (Signed)
Spoke with Buyer, retail and she stated pt should be ok to go to tele floor.

## 2014-06-11 NOTE — ED Notes (Signed)
Cards PA returned page and will notify the MD.

## 2014-06-11 NOTE — ED Notes (Signed)
Pt c/o fatigue went to pcp to be seen and sent here for abnormal EKG. Pt had knee sx about a month ago. Denies hx of SVT. MD currently at bedside.

## 2014-06-11 NOTE — Progress Notes (Signed)
     Received a phone call that the patient hypotensive and rates in 60s and up to 3 second pauses. Agree with discontinuing Dilt gtt. Will reduced schedule Lopressor dose tonight from 25mg -->12.5 with parameters to hold if HR <60 or SBP<100.   Angelena Form PA-C  [facility]

## 2014-06-11 NOTE — ED Notes (Signed)
Pt sent from doctors office for rapid heart rate and abnormal EKG.

## 2014-06-11 NOTE — ED Notes (Signed)
Pt given adenosine, able to see underlying rhythm of a.flutter. Pt did not convert back to NSR. Pt remains on the monitor and zoll. Vital signs remain stable.

## 2014-06-11 NOTE — ED Provider Notes (Signed)
CSN: [id number]     Arrival date & time 06/11/14  1009 History   First MD Initiated Contact with Patient 06/11/14 1017     Chief Complaint  Patient presents with  . Tachycardia     HPI  Patient presents with fatigue, after initially presenting to primary care, sent here for evaluation.  A patient is that his symptoms have been present for approximately 3/5 days, occurring without any clear precipitant.  Since onset there been no clear alleviating or exacerbating factors, no chest pain, no syncope, no nausea, no vomiting, no diarrhea. Patient has no similar prior episode. History of heart disease. Patient has no new medications. He had an uncomplicated left knee revision approximately one month ago.   Past Medical History  Diagnosis Date  . TIA (transient ischemic attack)     [place]  . DVT (deep venous thrombosis) 2001    Hx of Left leg   . Cancer     Skin ca.   Marland Kitchen AAA (abdominal aortic aneurysm)     followed by Dr. Deitra Mayo  . AAA (abdominal aortic aneurysm)   . History of palpitations     OCCASIONAL  . Left knee DJD   . GERD (gastroesophageal reflux disease)   . Constipation   . History of kidney stones   . BPH (benign prostatic hyperplasia)   . Frequency of urination    Past Surgical History  Procedure Laterality Date  . Cerebral aneurysm repair  1994    Hx of ruptured brain aneurysm Tx by Dr. Ellene Route  . Hemorrhoid surgery    . Kidney stone surgery    . Total knee arthroplasty Left 05/14/2014    Procedure: LEFT TOTAL KNEE ARTHROPLASTY;  Surgeon: Johnn Hai, MD;  Location: [facility];  Service: Orthopedics;  Laterality: Left;   Family History  Problem Relation Age of Onset  . Heart disease Father     Aneurysm  . Diabetes Father   . Heart attack Father   . Heart disease Brother     Heart Disease before age 5  . Cancer Brother    History  Substance Use Topics  . Smoking status: Former Smoker    Quit date: 09/25/1992  . Smokeless  tobacco: Never Used  . Alcohol Use: No    Review of Systems  Constitutional:       Per HPI, otherwise negative  HENT:       Per HPI, otherwise negative  Respiratory:       Per HPI, otherwise negative  Cardiovascular:       Per HPI, otherwise negative  Gastrointestinal: Negative for vomiting.  Endocrine:       Negative aside from HPI  Genitourinary:       Neg aside from HPI   Musculoskeletal:       Per HPI, otherwise negative  Skin: Negative.   Neurological: Negative for syncope.      Allergies  Review of patient's allergies indicates no known allergies.  Home Medications   Prior to Admission medications   Medication Sig Start Date End Date Taking? Authorizing Provider  docusate sodium (COLACE) 100 MG capsule Take 1 capsule (100 mg total) by mouth 2 (two) times daily as needed for mild constipation. 05/14/14   Johnn Hai, MD  folic acid (FOLVITE) 1 MG tablet Take 1 mg by mouth every morning.     Historical Provider, MD  lansoprazole (PREVACID) 30 MG capsule Take 30 mg by mouth every morning.  Historical Provider, MD  ondansetron (ZOFRAN) 4 MG tablet Take 4 mg by mouth every 6 (six) hours as needed for nausea or vomiting.    Historical Provider, MD  oxyCODONE-acetaminophen (PERCOCET) 5-325 MG per tablet Take 1-2 tablets by mouth every 4 (four) hours as needed. 05/14/14   Johnn Hai, MD  rivaroxaban (XARELTO) 10 MG TABS tablet Take 1 tablet (10 mg total) by mouth daily. 05/14/14   Johnn Hai, MD  tamsulosin (FLOMAX) 0.4 MG CAPS capsule Take 0.4 mg by mouth daily as needed (For problems with urination caused by an enlarged prostate.).     Historical Provider, MD   BP 113/73  Pulse 65  Temp(Src) 97.5 F (36.4 C) (Oral)  Resp 13  Ht 5\' 10"  (1.778 m)  Wt 228 lb (103.42 kg)  BMI 32.71 kg/m2  SpO2 99% Physical Exam  Nursing note and vitals reviewed. Constitutional: He is oriented to person, place, and time. He appears well-developed. No distress.  Listless  elderly-appearing male, but awake, answering questions appropriately.  HENT:  Head: Normocephalic and atraumatic.  Eyes: Conjunctivae and EOM are normal.  Cardiovascular: Regular rhythm.  Tachycardia present.   Pulmonary/Chest: Effort normal. No stridor. No respiratory distress.  Abdominal: He exhibits no distension.  Musculoskeletal: He exhibits no edema.  Neurological: He is alert and oriented to person, place, and time.  Skin: Skin is warm and dry.  Psychiatric: He has a normal mood and affect.    ED Course  Procedures (including critical care time) Labs Review Labs Reviewed  BASIC METABOLIC PANEL - Abnormal; Notable for the following:    GFR calc non Af Amer 66 (*)    GFR calc Af Amer 76 (*)    All other components within normal limits  CBC  TROPONIN I  LACTIC ACID, PLASMA  I-STAT TROPOININ, ED  I-STAT CG4 LACTIC ACID, ED     EKG Interpretation   Date/Time:  Thursday June 11 2014 10:16:32 EDT Ventricular Rate:  139 PR Interval:    QRS Duration: 84 QT Interval:  328 QTC Calculation: 499 R Axis:   84 Text Interpretation:  Supraventricular tachycardia Otherwise normal ECG  aflutter versus svt Abnormal ekg Confirmed by Carmin Muskrat  MD [phone])  on 06/11/2014 11:20:24 AM     On arrival the patient and a notable elevated heart rate, rate 130s, 140s, regular, abnormal Pulse oximetry was 99% on room air normal   After the initial evaluation, concern for SVT versus atrial flutter, I had a brief conversation with the patient and his wife about risks, benefits, with adenosine therapy. Following consent the patient received adenosine, 6 mg. Patient did not have cardioversion, but the porus allowed visualization of atrial flutter waves. Subsequently, the patient was started on Cardizem.  Uptake: Heart rate less than 100. Patient still has atrial flutter, though with substantially decreased heart rate.  On repeat exam the patient has persistent atrial flutter, and  with slightly lower blood pressure, rate will be decreased.  MDM  Patient presents with fatigue, and son have no arrhythmia.  After attempts to convert with adenosine demonstrated presence of atrial flutter, patient was started on Cardizem drip.  This substantially decreased his heart rate. Interestingly, the patient is taking anticoagulant for his recent surgical revision of his left knee. With decreased heart rate, and improved his symptoms, patients with appropriate for admission after discussed his case with our cardiology team given the patient's no history of arrhythmia.  CRITICAL CARE Performed by: Carmin Muskrat Total critical care  time: 40 Critical care time was exclusive of separately billable procedures and treating other patients. Critical care was necessary to treat or prevent imminent or life-threatening deterioration. Critical care was time spent personally by me on the following activities: development of treatment plan with patient and/or surrogate as well as nursing, discussions with consultants, evaluation of patient's response to treatment, examination of patient, obtaining history from patient or surrogate, ordering and performing treatments and interventions, ordering and review of laboratory studies, ordering and review of radiographic studies, pulse oximetry and re-evaluation of patient's condition.     Carmin Muskrat, MD 06/11/14 [phone]

## 2014-06-11 NOTE — H&P (Signed)
CARDIOLOGY CONSULT NOTE   Patient ID: COLIE FUGITT MRN: [id number] DOB/AGE: 11/26/1940 73 y.o.  Admit date: 06/11/2014  Primary Physician   Irven Shelling, MD Primary Cardiologist  New Reason for Consultation   A flutter.   HPI: Jeffrey Stephens is a 73 y.o. male with a history of TIA, DVT, AAA (3.4cm 01/2013), GERD, RA on methotrexate, clipping in brain for presume SAH? (1994) and no past cardiac history who presented to [facility] today from his PCPs office after being found in atrial flutter with RVR. He has no history of atrial flutter and has never been seen by a cardiologist.    He was referred from physical therapy to his primary care office for consistent hypotension and general malaise. He had been feeling weak, shakey/jittery on the inside and generally just feeling unwell since his recent knee revision on 05/14/14. He admits to lightheadedness and pre-syncope but has not passed out. No CP, diaphoresis or SOB. Patient with a history of DVT and had recent knee surgery and was placed on home lovenox s/p surgery. Now finished with regimen and taking full dose ASA. Also on methotrexate for RA. He is feeling much better now that his HR is controlled and denies palpitations or associated symptoms.   Past Medical History  Diagnosis Date  . TIA (transient ischemic attack)     [place]  . DVT (deep venous thrombosis) 2001    Hx of Left leg   . Cancer     Skin ca.   Marland Kitchen AAA (abdominal aortic aneurysm)     followed by Dr. Deitra Mayo  . AAA (abdominal aortic aneurysm)   . History of palpitations     OCCASIONAL  . Left knee DJD   . GERD (gastroesophageal reflux disease)   . Constipation   . History of kidney stones   . BPH (benign prostatic hyperplasia)   . Frequency of urination      Past Surgical History  Procedure Laterality Date  . Cerebral aneurysm repair  1994    Hx of ruptured brain aneurysm Tx by Dr. Ellene Route  . Hemorrhoid surgery    . Kidney  stone surgery    . Total knee arthroplasty Left 05/14/2014    Procedure: LEFT TOTAL KNEE ARTHROPLASTY;  Surgeon: Johnn Hai, MD;  Location: [facility];  Service: Orthopedics;  Laterality: Left;    No Known Allergies  I have reviewed the patient's current medications   . diltiazem (CARDIZEM) infusion 5 mg/hr (06/11/14 1043)     Prior to Admission medications   Medication Sig Start Date End Date Taking? Authorizing Provider  docusate sodium (COLACE) 100 MG capsule Take 1 capsule (100 mg total) by mouth 2 (two) times daily as needed for mild constipation. 05/14/14   Johnn Hai, MD  folic acid (FOLVITE) 1 MG tablet Take 1 mg by mouth every morning.     Historical Provider, MD  lansoprazole (PREVACID) 30 MG capsule Take 30 mg by mouth every morning.     Historical Provider, MD  ondansetron (ZOFRAN) 4 MG tablet Take 4 mg by mouth every 6 (six) hours as needed for nausea or vomiting.    Historical Provider, MD  oxyCODONE-acetaminophen (PERCOCET) 5-325 MG per tablet Take 1-2 tablets by mouth every 4 (four) hours as needed. 05/14/14   Johnn Hai, MD  rivaroxaban (XARELTO) 10 MG TABS tablet Take 1 tablet (10 mg total) by mouth daily. 05/14/14   Johnn Hai, MD  tamsulosin (  FLOMAX) 0.4 MG CAPS capsule Take 0.4 mg by mouth daily as needed (For problems with urination caused by an enlarged prostate.).     Historical Provider, MD     History   Social History  . Marital Status: Widowed    Spouse Name: N/A    Number of Children: N/A  . Years of Education: N/A   Occupational History  . Not on file.   Social History Main Topics  . Smoking status: Former Smoker    Quit date: 09/25/1992  . Smokeless tobacco: Never Used  . Alcohol Use: No  . Drug Use: No  . Sexual Activity: Not Currently   Other Topics Concern  . Not on file   Social History Narrative  . No narrative on file    Family Status  Relation Status Death Age  . Father Deceased   . Brother Alive   . Mother Deceased     Family History  Problem Relation Age of Onset  . Heart disease Father     Aneurysm  . Diabetes Father   . Heart attack Father   . Heart disease Brother     Heart Disease before age 67  . Cancer Brother      ROS:  Full 14 point review of systems complete and found to be negative unless listed above.  Physical Exam: Blood pressure 121/76, pulse 64, temperature 97.5 F (36.4 C), temperature source Oral, resp. rate 17, height 5\' 10"  (1.778 m), weight 228 lb (103.42 kg), SpO2 100.00%.  General: Well developed, well nourished, male in no acute distress Head: Eyes PERRLA, No xanthomas.   Normocephalic and atraumatic, oropharynx without edema or exudate.  Lungs: CTA Heart: Heart irregular rate and rhythm with S1, S2  murmur. pulses are 2+ extrem.   Neck: No carotid bruits. No lymphadenopathy.  JVD not elevated. Abdomen: Bowel sounds present, abdomen soft and non-tender without masses or hernias noted. Msk:  No spine or cva tenderness. No weakness, no joint deformities or effusions. Extremities: No clubbing or cyanosis.  No edema.  Neuro: Alert and oriented X 3. No focal deficits noted. Psych:  Good affect, responds appropriately Skin: No rashes or lesions noted.  Labs:   Lab Results  Component Value Date   WBC 8.6 06/11/2014   HGB 14.7 06/11/2014   HCT 43.6 06/11/2014   MCV 93.6 06/11/2014   PLT 244 06/11/2014   No results found for this basename: INR,  in the last 72 hours   Recent Labs Lab 06/11/14 1036  NA 141  K 4.8  CL 104  CO2 25  BUN 12  CREATININE 1.10  CALCIUM 9.1  GLUCOSE 95    Recent Labs  06/11/14 1036  TROPONINI <0.30    Recent Labs  06/11/14 1042  TROPIPOC 0.01    ECG: NSVT; after 6 mg of adenosine, flutter waves where apparent  Vent. rate 139 BPM PR interval * ms QRS duration 84 ms QT/QTc 328/499 ms P-R-T axes * 84 78   Radiology:  No results found.  ASSESSMENT AND PLAN:     Jeffrey GONGAWARE is a 73 y.o. male with a history of TIA,  DVT, AAA (3.4cm 01/2013), GERD, RA on methotrexate, clipping in brain for presume SAH? (1994) and no past cardiac history who presented to [facility] today from his PCPs office after being found in atrial flutter with RVR. He has no history of atrial flutter and has never been seen by a cardiologist.   Atrial flutter with rapid ventricular  response -- rates in the 130s upon arrival to ED. Rate now controlled at upper 60s after adenosine and IV diltiazem. Going in and out 2:1 and 3:1 flutter. Hard to say when he first went into flutter as he has not been feeling well for last two weeks.  -- CHADSVASC2 score of at least 4 (age, AAA, previous TIA), no history of DM, HTN or CHF. Will order a HgbA1C and will likely need an 2D echo on this admission. Therefore, long term anticoagulation warranted. Possible candidate for NOAC. MD to see.  -- will order TSH    Rheumatoid Arthritis  -- on long term MTX  AAA - 3.4 cm in 01/2013 followed by Dr. Scot Dock -- Repeat US scheduled in next month  Left knee DJD- s/p recent knee surgery on 05/14/14  Previous TIA- no focal neurologic defects.    SignedCrista Luria 06/11/2014 12:30 PM  Pager [phone]   Patient seen and examined. Agree with assessment and plan. Very pleasant 73 yo WM who is s/p L knee replacement ~1 month ago. He had been on lovenox and recently just on ASA. He was found to be in A fluitter today with RVR, initial HR ~140. He was treated with IV cardizem, now A flutter with 3-4:1 block.  No chest pain or dyspnea presently. History is also notable for a prior CNS clipping procedure for bleed in 1994 by Dr. Ellene Route, RA on MTX, and known AAA. No known CAD. Will start oral beta blocker, 2 d echo today and begin eliquis. Will DC ASA. Consider TEE cardioversion if does not convert pharmacologically.   Troy Sine, MD, [facility] 06/11/2014 1:02 PM

## 2014-06-11 NOTE — ED Notes (Addendum)
Pt having longer pauses between Q waves and more frequently. Cardiology has been paged.

## 2014-06-11 NOTE — ED Notes (Signed)
Pt placed on monitor and zoll.

## 2014-06-12 ENCOUNTER — Encounter (HOSPITAL_COMMUNITY): Payer: Self-pay | Admitting: Physician Assistant

## 2014-06-12 LAB — BASIC METABOLIC PANEL
ANION GAP: 12 (ref 5–15)
BUN: 13 mg/dL (ref 6–23)
CALCIUM: 8.8 mg/dL (ref 8.4–10.5)
CO2: 23 mEq/L (ref 19–32)
CREATININE: 1.1 mg/dL (ref 0.50–1.35)
Chloride: 104 mEq/L (ref 96–112)
GFR, EST AFRICAN AMERICAN: 76 mL/min — AB (ref >90)
GFR, EST NON AFRICAN AMERICAN: 66 mL/min — AB (ref >90)
Glucose, Bld: 102 mg/dL — ABNORMAL HIGH (ref 70–99)
Potassium: 4.4 mEq/L (ref 3.7–5.3)
SODIUM: 139 meq/L (ref 137–147)

## 2014-06-12 LAB — LIPID PANEL
CHOL/HDL RATIO: 3 ratio
CHOLESTEROL: 95 mg/dL (ref 0–200)
HDL: 32 mg/dL — ABNORMAL LOW (ref >39)
LDL Cholesterol: 47 mg/dL (ref 0–99)
Triglycerides: 80 mg/dL (ref <150)
VLDL: 16 mg/dL (ref 0–40)

## 2014-06-12 LAB — CBC
HCT: 38.2 % — ABNORMAL LOW (ref 39.0–52.0)
Hemoglobin: 12.6 g/dL — ABNORMAL LOW (ref 13.0–17.0)
MCH: 30.1 pg (ref 26.0–34.0)
MCHC: 33 g/dL (ref 30.0–36.0)
MCV: 91.4 fL (ref 78.0–100.0)
PLATELETS: 231 10*3/uL (ref 150–400)
RBC: 4.18 MIL/uL — ABNORMAL LOW (ref 4.22–5.81)
RDW: 14.1 % (ref 11.5–15.5)
WBC: 7.9 10*3/uL (ref 4.0–10.5)

## 2014-06-12 LAB — PROTIME-INR
INR: 1.19 (ref 0.00–1.49)
Prothrombin Time: 15.1 seconds (ref 11.6–15.2)

## 2014-06-12 LAB — TROPONIN I

## 2014-06-12 MED ORDER — APIXABAN 5 MG PO TABS: 5.0000 mg | ORAL_TABLET | Freq: Two times a day (BID) | ORAL | Status: DC

## 2014-06-12 MED ORDER — METOPROLOL TARTRATE 12.5 MG HALF TABLET: 12.5000 mg | ORAL_TABLET | Freq: Two times a day (BID) | ORAL | Status: DC

## 2014-06-12 NOTE — Care Management Note (Addendum)
    Page 1 of 1   06/12/2014     12:05:35 PM CARE MANAGEMENT NOTE 06/12/2014  Patient:  Jeffrey Stephens, Jeffrey Stephens   Account Number:  [id number]  Date Initiated:  06/12/2014  Documentation initiated by:  GRAVES-BIGELOW,Norberta Stobaugh  Subjective/Objective Assessment:   Pt admitted for a flutter with RVR.     Action/Plan:   Plan for d/c home on Eliquis when stable. Benefits check in process for medication. CM will provide pt with 30 day free card. Pt will need Rx for 30 day free no refills.   Anticipated DC Date:  06/13/2014   Anticipated DC Plan:  [place]  CM consult  Medication Assistance      Choice offered to / List presented to:             Status of service:  Completed, signed off Medicare Important Message given?  NO (If response is "NO", the following Medicare IM given date fields will be blank) Date Medicare IM given:   Medicare IM given by:   Date Additional Medicare IM given:   Additional Medicare IM given by:    Discharge Disposition:  HOME/SELF CARE  Per UR Regulation:  Reviewed for med. necessity/level of care/duration of stay  If discussed at [place] of Stay Meetings, dates discussed:    Comments:  06-12-14 [place], RN,BSN per rep at [facility] rx:  eliquis: tier 2; patient is in deductible phase- <$260.85 remaining until deductible is met>  he will have to pay cost of medication; after deductible met $30.00 at retail - mail-order $80 for 90 day supply; no auth required  patient can use any major retail pharmacy  CM did call [facility] [address] and medication is available.

## 2014-06-12 NOTE — Progress Notes (Signed)
Patient Name: COLMAN BIRDWELL Date of Encounter: 06/12/2014     Principal Problem:   Atrial flutter with rapid ventricular response Active Problems:   ABDOMINAL AORTIC ANEURYSM   PULMONARY NODULE   ARTHRITIS, RHEUMATOID   SKIN CANCER, HX OF   NEPHROLITHIASIS, HX OF   BENIGN PROSTATIC HYPERTROPHY, HX OF   Left knee DJD    SUBJECTIVE  Does feel fluttering in his chest from time to time. No CP or SOB. Wants to go home  CURRENT MEDS . apixaban  5 mg Oral BID  . folic acid  1 mg Oral q morning - 10a  . Influenza vac split quadrivalent PF  0.5 mL Intramuscular Tomorrow-1000  . metoprolol tartrate  12.5 mg Oral BID  . pantoprazole  40 mg Oral Daily    OBJECTIVE  Filed Vitals:   06/11/14 1727 06/11/14 1940 06/11/14 2258 06/12/14 0627  BP: 112/59 104/58 117/51 106/64  Pulse: 58 66 63 70  Temp: 98 F (36.7 C) 97.7 F (36.5 C)  98.4 F (36.9 C)  TempSrc: Oral Oral  Oral  Resp: 16 18  18   Height:  5\' 10"  (1.778 m)    Weight:  228 lb (103.42 kg)  225 lb 11.2 oz (102.377 kg)  SpO2: 99% 96%  95%    Intake/Output Summary (Last 24 hours) at 06/12/14 0758 Last data filed at 06/12/14 0641  Gross per 24 hour  Intake    600 ml  Output   1100 ml  Net   -500 ml   Filed Weights   06/11/14 1021 06/11/14 1940 06/12/14 0627  Weight: 228 lb (103.42 kg) 228 lb (103.42 kg) 225 lb 11.2 oz (102.377 kg)    PHYSICAL EXAM  General: Pleasant, NAD. Neuro: Alert and oriented X 3. Moves all extremities spontaneously. Psych: Normal affect. HEENT:  Normal  Neck: Supple without bruits or JVD. Lungs:  Resp regular and unlabored, CTA. Heart: RRR no s3, s4, or murmurs. Abdomen: Soft, non-tender, non-distended, BS + x 4.  Extremities: No clubbing, cyanosis or edema. DP/PT/Radials 2+ and equal bilaterally.  Accessory Clinical Findings  CBC  Recent Labs  06/11/14 1036 06/12/14 0050  WBC 8.6 7.9  HGB 14.7 12.6*  HCT 43.6 38.2*  MCV 93.6 91.4  PLT 244 400   Basic Metabolic  Panel  Recent Labs  06/11/14 1036 06/11/14 1517 06/12/14 0050  NA 141  --  139  K 4.8  --  4.4  CL 104  --  104  CO2 25  --  23  GLUCOSE 95  --  102*  BUN 12  --  13  CREATININE 1.10  --  1.10  CALCIUM 9.1  --  8.8  MG  --  2.1  --     Cardiac Enzymes  Recent Labs  06/11/14 1305 06/11/14 1915 06/12/14 0050  TROPONINI <0.30 <0.30 <0.30    Hemoglobin A1C  Recent Labs  06/11/14 1517  HGBA1C 5.3   Fasting Lipid Panel  Recent Labs  06/12/14 0050  CHOL 95  HDL 32*  LDLCALC 47  TRIG 80  CHOLHDL 3.0   Thyroid Function Tests  Recent Labs  06/11/14 1305  TSH 1.770    TELE  4:1 Aflutter. HR 67  Radiology/Studies  Dg Knee 1-2 Views Left  06-12-2014   CLINICAL DATA:  Postop left total knee arthroplasty  EXAM: LEFT KNEE - 1-2 VIEW  COMPARISON:  Preoperative exam of May 07, 2014  FINDINGS: The patient has undergone total knee prosthesis placement.  Radiographic positioning of the prosthetic components is good. A surgical drain line as well as skin staples are present.  IMPRESSION: There is no immediate postprocedure complication following left total knee arthroplasty.    2D ECHO: 06/11/2014: LV EF: 55% - 60% Study Conclusions - Left ventricle: The cavity size was normal. Wall thickness was normal. Systolic function was normal. The estimated ejection fraction was in the range of 55% to 60%. Indeterminant diastolic function (atrial flutter). Wall motion was normal; there were no regional wall motion abnormalities. - Aortic valve: There was no stenosis. - Mitral valve: There was no significant regurgitation. - Left atrium: The atrium was mildly dilated. - Right ventricle: The cavity size was normal. Systolic function was normal. - Pulmonary arteries: No complete TR doppler jet so unable to estimate PA systolic pressure. - Systemic veins: The IVC was not visualized. Impressions: - The patient was in atrial flutter. Normal LV size and systolic function,  EF 27-25%. Normal RV size and systolic function. No significant valvular abnormalities.    ASSESSMENT AND PLAN  Jeffrey Stephens is a 73 y.o. male with a history of TIA, DVT, AAA (3.4cm 01/2013), GERD, RA on methotrexate, clipping in brain for presume SAH? (1994) and no past cardiac history who presented to [facility] yesterday from his PCPs office after being found in atrial flutter with RVR. He has no history of atrial flutter and has never been seen by a cardiologist.   Atrial flutter with rapid ventricular response  -- Rates in the 140s upon arrival to ED. A flutter with 3-4:1 block after adenosine and IV diltiazem. Placed on metoprolol 25mg  BID. Then patient became slightly hypotensive and persistent Aflutter with rates down to 50s- 60s with up to 3 second pauses. Dilt gtt discontinued and metoprolol reduced to 12.5mg  BID with parameters. Dilt then resumed at 4 am today for unknown reason.  -- Hard to say when he first went into flutter as he has not been feeling well for last two weeks.  -- CHADSVASC2 score of at least 4 (age, AAA, previous TIA), no DM, HTN or CHF. Long term anticoagulation felt to be warranted and he was placed on Eliquis 5mg  BID. ASA discontinued. -- TSH normal (1.77). -- 2D ECHO: 06/11/2014: EF 55-60%. Normal wall thickness. Indeterminate DD ( atrial flutter ). No RWMA. Mild LA dilation. Normal RV size and systolic function. No significant valvular abnormalities. -- Will make early follow up in office. Consider TEE cardioversion if does not convert on his own. TEE/DCCV if before 4 weeks on Eliquis. Will likely discharge after we see how he does off dilt gtt, which was discontinued around 9am this morning.   Rheumatoid Arthritis  -- on long term MTX   AAA - 3.4 cm in 01/2013 followed by Dr. Scot Dock  -- Repeat US scheduled in next month   Left knee DJD- s/p recent knee surgery on 05/14/14   Previous TIA- no focal neurologic defects.   CNS clipping procedure for bleed in 1994  by Dr. Ellene Route- no problems since that time. Felt to be stable for anticoagulation.   Lipid panel- TC 95. TG 80. HDL 32. LDL 47. Excellent control with no medications.   SignedEileen Stanford PA-C  Pager [phone]  Agree, see also my note.  Sanda Klein, MD, [facility] [phone] office [phone] pager

## 2014-06-12 NOTE — Progress Notes (Signed)
I have seen and examined the patient along with Angelena Form, PA.  I have reviewed the chart, notes and new data.  I agree with PA's note.  Key new complaints: asymptomatic  Key examination changes: irregular rhythm, 2/6 systolic murmur - early peaking Key new findings / data: normal echo findings except mild left atrial dilation  Ventricular rate consistently around 70, but IV diltiazem was restarted (not sure why). Have DC'd it. Premature for DCCV (only one dose Eliquis so far).   PLAN: Watch off diltiazem for 1-2 hours. DC home if still well rate controlled. F/U Monday. If still in atrial flutter will schedule for elective TEE cardioversion next week, but if he prefers can also wait for 3-4 weeks of Eliquis before cardioversion, without TEE.  Sanda Klein, MD, [place] [phone] 06/12/2014, 9:20 AM

## 2014-06-12 NOTE — Discharge Summary (Signed)
Discharge Summary   Patient ID: Jeffrey Stephens MRN: [id number], DOB/AGE: 1941/06/23 73 y.o. Admit date: 06/11/2014 D/C date:     06/12/2014  Primary Cardiologist: Dr. [name] (new)   Principal Problem:   Atrial flutter with rapid ventricular response Active Problems:   ABDOMINAL AORTIC ANEURYSM   PULMONARY NODULE   ARTHRITIS, RHEUMATOID   SKIN CANCER, HX OF   NEPHROLITHIASIS, HX OF   BENIGN PROSTATIC HYPERTROPHY, HX OF   Left knee DJD   Admission Dates: 06/11/14 - 06/12/14 Discharge Diagnosis: Atrial flutter with RVR- new onset- s/p rate control and AC intiation  HPI: Jeffrey Stephens is a 73 y.o. male with a history of TIA, DVT, AAA (3.4cm 01/2013), GERD, RA on methotrexate, clipping in brain for presume SAH? (1994) and no past cardiac history who presented to [facility] 06/11/14 from his PCPs office after being found in atrial flutter with RVR. He has no history of atrial flutter and has never been seen by a cardiologist.   Patient underwent knee surgery for DJD on 05/14/14 and been attending rehab post op. On 06/11/14 was referred from physical therapy to his primary care office for consistent hypotension and general malaise. He had been feeling weak, shakey/jittery on the inside and generally just feeling unwell since his knee revision about a month ago. He admits to lightheadedness and pre-syncope but has not passed out. No CP, diaphoresis or SOB. Patient with a history of DVT and had recent knee surgery and was placed on home lovenox s/p surgery. Now finished with regimen and taking full dose ASA. Also on methotrexate for RA.   Hospital Course  Atrial flutter with rapid ventricular response  -- Rates in the 140s upon arrival to ED. A flutter with 3-4:1 block after adenosine and IV diltiazem. Placed on metoprolol 25mg  BID. Then patient became slightly hypotensive and persistent Aflutter with rates down to 50s- 60s with up to 3 second pauses. Dilt gtt discontinued and metoprolol reduced to  12.5mg  BID with parameters. Dilt then resumed at 4 am the next day for unknown reason. This was again discontinued and he was monitored for 2-3 hours off diltiazem and did well with rate control.  -- Hard to say when he first went into flutter as he has not been feeling well for last two weeks.  -- CHADSVASC2 score of at least 4 (age, AAA, previous TIA), no DM, HTN or CHF. Long term anticoagulation felt to be warranted and he was placed on Eliquis 5mg  BID. ASA discontinued.  -- TSH normal (1.77). HgA1c 5.3. -- 2D ECHO: 06/11/2014: EF 55-60%. Normal wall thickness. Indeterminate DD ( atrial flutter ). No RWMA. Mild LA dilation. Normal RV size and systolic function. No significant valvular abnormalities.  -- He will be discharged today with F/U Monday with Dr. [name]. If still in atrial flutter will schedule for elective TEE cardioversion next week, but if he prefers can also wait for 3-4 weeks of Eliquis before cardioversion, without TEE.  Rheumatoid Arthritis  -- on long term MTX   AAA - 3.4 cm in 01/2013 followed by Dr. Scot Dock  -- Repeat US scheduled in next month   Left knee DJD- s/p recent knee surgery on 05/14/14   Previous TIA- no focal neurologic defects.   CNS clipping procedure for bleed in 1994 by Dr. Ellene Route- no problems since that time. Felt to be stable for anticoagulation.   Lipid panel- TC 95. TG 80. HDL 32. LDL 47. Excellent control with no medications.  The  patient has had an uncomplicated hospital course and is recovering well. He has been seen by Dr. [name] today and deemed ready for discharge home. All follow-up appointments have been scheduled. Discharge medications are listed below. He was seen by care management and given coupon for 1 month free of Eliquis. Earley Brooke from the [place] office will call him to place him on the schedule Monday. He will continue on Metoprolol 12.5 mg BID until then.    Discharge Vitals: Blood pressure 106/64, pulse 70, temperature  98.4 F (36.9 C), temperature source Oral, resp. rate 18, height 5\' 10"  (1.778 m), weight 225 lb 11.2 oz (102.377 kg), SpO2 95.00%.  Labs: Lab Results  Component Value Date   WBC 7.9 06/12/2014   HGB 12.6* 06/12/2014   HCT 38.2* 06/12/2014   MCV 91.4 06/12/2014   PLT 231 06/12/2014     Recent Labs Lab 06/12/14 0050  NA 139  K 4.4  CL 104  CO2 23  BUN 13  CREATININE 1.10  CALCIUM 8.8  GLUCOSE 102*    Recent Labs  06/11/14 1036 06/11/14 1305 06/11/14 1915 06/12/14 0050  TROPONINI <0.30 <0.30 <0.30 <0.30   Lab Results  Component Value Date   CHOL 95 06/12/2014   HDL 32* 06/12/2014   LDLCALC 47 06/12/2014   TRIG 80 06/12/2014     Diagnostic Studies/Procedures   Dg Knee 1-2 Views Left  05/14/2014 CLINICAL DATA: Postop left total knee arthroplasty EXAM: LEFT KNEE - 1-2 VIEW COMPARISON: Preoperative exam of May 07, 2014 FINDINGS: The patient has undergone total knee prosthesis placement. Radiographic positioning of the prosthetic components is good. A surgical drain line as well as skin staples are present. IMPRESSION: There is no immediate postprocedure complication following left total knee arthroplasty.    2D ECHO: 06/11/2014: LV EF: 55% - 60% Study Conclusions - Left ventricle: The cavity size was normal. Wall thickness was normal. Systolic function was normal. The estimated ejection fraction was in the range of 55% to 60%. Indeterminant diastolic function (atrial flutter). Wall motion was normal; there were no regional wall motion abnormalities. - Aortic valve: There was no stenosis. - Mitral valve: There was no significant regurgitation. - Left atrium: The atrium was mildly dilated. - Right ventricle: The cavity size was normal. Systolic function was normal. - Pulmonary arteries: No complete TR doppler jet so unable to estimate PA systolic pressure. - Systemic veins: The IVC was not visualized. Impressions: - The patient was in atrial flutter. Normal LV size  and systolic function, EF 01-02%. Normal RV size and systolic function. No significant valvular abnormalities.     Discharge Medications     Medication List    STOP taking these medications       aspirin 325 MG tablet      TAKE these medications       apixaban 5 MG Tabs tablet  Commonly known as:  ELIQUIS  Take 1 tablet (5 mg total) by mouth 2 (two) times daily.     docusate sodium 100 MG capsule  Commonly known as:  COLACE  Take 100 mg by mouth 2 (two) times daily as needed for mild constipation.     folic acid 1 MG tablet  Commonly known as:  FOLVITE  Take 1 mg by mouth every morning.     lansoprazole 30 MG capsule  Commonly known as:  PREVACID  Take 30 mg by mouth every morning.     metoprolol tartrate 12.5 mg Tabs tablet  Commonly known as:  LOPRESSOR  Take 0.5 tablets (12.5 mg total) by mouth 2 (two) times daily.     oxyCODONE-acetaminophen 5-325 MG per tablet  Commonly known as:  PERCOCET/ROXICET  Take 1 tablet by mouth every 4 (four) hours as needed for moderate pain.        Disposition   The patient will be discharged in stable condition to home.  Follow-up Information   Follow up with Sanda Klein, MD On 06/15/2014. (The office will call to make an appointment with you on Monday with Dr. Loletha Grayer.)    Specialty:  Cardiology   Contact information:   [address] [place] [address] [phone]         Duration of Discharge Encounter: Greater than 30 minutes including physician and PA time.  Mable Fill R PA-C 06/12/2014, 12:21 PM

## 2014-06-12 NOTE — Discharge Instructions (Signed)
Atrial Flutter °Atrial flutter is a heart rhythm that can cause the heart to beat very fast (tachycardia). It originates in the upper chambers of the heart (atria). In atrial flutter, the top chambers of the heart (atria) often beat much faster than the bottom chambers of the heart (ventricles). Atrial flutter has a regular "saw toothed" appearance in an EKG readout. An EKG is a test that records the electrical activity of the heart. Atrial flutter can cause the heart to beat up to 150 beats per minute (BPM). Atrial flutter can either be short lived (paroxysmal) or permanent.  °CAUSES  °Causes of atrial flutter can be many. Some of these include: °· Heart related issues: °¨ Heart attack (myocardial infarction). °¨ Heart failure. °¨ Heart valve problems. °¨ Poorly controlled high blood pressure (hypertension). °¨ After open heart surgery. °· Lung related issues: °¨ A blood clot in the lungs (pulmonary embolism). °¨ Chronic obstructive pulmonary disease (COPD). Medications used to treat COPD can attribute to atrial flutter. °· Other related causes: °¨ Hyperthyroidism. °¨ Caffeine. °¨ Some decongestant cold medications. °¨ Low electrolyte levels such as potassium or magnesium. °¨ Cocaine. °SYMPTOMS °· An awareness of your heart beating rapidly (palpitations). °· Shortness of breath. °· Chest pain. °· Low blood pressure (hypotension). °· Dizziness or fainting. °DIAGNOSIS  °Different tests can be performed to diagnose atrial flutter.  °· An EKG. °· Holter monitor. This is a 24-hour recording of your heart rhythm. You will also be given a diary. Write down all symptoms that you have and what you were doing at the time you experienced symptoms. °· Cardiac event monitor. This small device can be worn for up to 30 days. When you have heart symptoms, you will push a button on the device. This will then record your heart rhythm. °· Echocardiogram. This is an imaging test to look at your heart. Your caregiver will look at your  heart valves and the ventricles. °· Stress test. This test can help determine if the atrial flutter is related to exercise or if coronary artery disease is present. °· Laboratory studies will look at certain blood levels like: °¨ Complete blood count (CBC). °¨ Potassium. °¨ Magnesium. °¨ Thyroid function. °TREATMENT  °Treatment of atrial flutter varies. A combination of therapies may be used or sometimes atrial flutter may need only 1 type of treatment.  °Lab work: °If your blood work, such as your electrolytes (potassium, magnesium) or your thyroid function tests, are abnormal, your caregiver will treat them accordingly.  °Medication:  °There are several different types of medications that can convert your heart to a normal rhythm and prevent atrial flutter from reoccurring.  °Nonsurgical procedures: °Nonsurgical techniques may be used to control atrial flutter. Some examples include: °· Cardioversion. This technique uses either drugs or an electrical shock to restore a normal heart rhythm: °¨ Cardioversion drugs may be given through an intravenous (IV) line to help "reset" the heart rhythm. °¨ In electrical cardioversion, your caregiver shocks your heart with electrical energy. This helps to reset the heartbeat to a normal rhythm. °· Ablation. If atrial flutter is a persistent problem, an ablation may be needed. This procedure is done under mild sedation. High frequency radio-wave energy is used to destroy the area of heart tissue responsible for atrial flutter. °SEEK IMMEDIATE MEDICAL CARE IF:  °You have: °· Dizziness. °· Near fainting or fainting. °· Shortness of breath. °· Chest pain or pressure. °· Sudden nausea or vomiting. °· Profuse sweating. °If you have the above symptoms,   call your local emergency service immediately! Do not drive yourself to the hospital. MAKE SURE YOU:   Understand these instructions.  Will watch your condition.  Will get help right away if you are not doing well or get  worse. Document Released: 01/28/2009 Document Revised: 01/26/2014 Document Reviewed: 01/28/2009 [facility] Patient Information 2015 [place], [place]. This information is not intended to replace advice given to you by your health care provider. Make sure you discuss any questions you have with your health care provider.  Information on my medicine - ELIQUIS (apixaban)  This medication education was reviewed with me or my healthcare representative as part of my discharge preparation.  The pharmacist that spoke with me during my hospital stay was:  Deboraha Sprang, [facility]  Why was Eliquis prescribed for you? Eliquis was prescribed for you to reduce the risk of forming blood clots that can cause a stroke if you have a medical condition called atrial fibrillation (a type of irregular heartbeat) OR to reduce the risk of a blood clots forming after orthopedic surgery.  What do You need to know about Eliquis ? Take your Eliquis TWICE DAILY - one tablet in the morning and one tablet in the evening with or without food.  It would be best to take the doses about the same time each day.  If you have difficulty swallowing the tablet whole please discuss with your pharmacist how to take the medication safely.  Take Eliquis exactly as prescribed by your doctor and DO NOT stop taking Eliquis without talking to the doctor who prescribed the medication.  Stopping may increase your risk of developing a new clot or stroke.  Refill your prescription before you run out.  After discharge, you should have regular check-up appointments with your healthcare provider that is prescribing your Eliquis.  In the future your dose may need to be changed if your kidney function or weight changes by a significant amount or as you get older.  What do you do if you miss a dose? If you miss a dose, take it as soon as you remember on the same day and resume taking twice daily.  Do not take more than one dose of ELIQUIS at the  same time.  Important Safety Information A possible side effect of Eliquis is bleeding. You should call your healthcare provider right away if you experience any of the following:   Bleeding from an injury or your nose that does not stop.   Unusual colored urine (red or dark brown) or unusual colored stools (red or black).   Unusual bruising for unknown reasons.   A serious fall or if you hit your head (even if there is no bleeding).  Some medicines may interact with Eliquis and might increase your risk of bleeding or clotting while on Eliquis. To help avoid this, consult your healthcare provider or pharmacist prior to using any new prescription or non-prescription medications, including herbals, vitamins, non-steroidal anti-inflammatory drugs (NSAIDs) and supplements.  This website has more information on Eliquis (apixaban): [email or web address].no.

## 2014-06-12 NOTE — Progress Notes (Signed)
Utilization Review Completed.Jeffrey Stephens T9/18/2015  

## 2014-06-15 ENCOUNTER — Ambulatory Visit: Payer: Medicare Other | Admitting: Cardiovascular Disease

## 2014-06-16 ENCOUNTER — Telehealth: Payer: Self-pay | Admitting: *Deleted

## 2014-06-16 NOTE — Telephone Encounter (Signed)
Prior auth for Eliquis 5mg  approved 06/16/2014 through 06/17/2015.

## 2014-06-16 NOTE — Telephone Encounter (Signed)
Patient walked in requesting note to return to therapy (s/p left TKA on 05/14/14 by Dr. Tonita Cong)   Has office visit 06/17/14 (tomorrow) with Dr. [name] for post-hospital follow up.   Walk-in sheet given to [facility], [place] to obtain letter for patient during OV tomorrow.

## 2014-06-17 ENCOUNTER — Ambulatory Visit (INDEPENDENT_AMBULATORY_CARE_PROVIDER_SITE_OTHER): Payer: Medicare Other | Admitting: Cardiovascular Disease

## 2014-06-17 ENCOUNTER — Encounter: Payer: Self-pay | Admitting: Cardiovascular Disease

## 2014-06-17 VITALS — BP 120/71 | HR 77 | Resp 16 | Ht 70.0 in | Wt 229.1 lb

## 2014-06-17 DIAGNOSIS — I4892 Unspecified atrial flutter: Secondary | ICD-10-CM

## 2014-06-17 DIAGNOSIS — I2510 Atherosclerotic heart disease of native coronary artery without angina pectoris: Secondary | ICD-10-CM | POA: Insufficient documentation

## 2014-06-17 NOTE — Assessment & Plan Note (Signed)
This occurred late after knee surgery and is likely to happen again. With history of TIA, CHADSVasc score of 4, recommend lifelong anticoagulation. Consider RF ablation if symptoms are prevalent in the future. Left atrial enlargement suggests possible future atrial fibrillation -would not rush to ablation yet.

## 2014-06-17 NOTE — Patient Instructions (Addendum)
Dr. [name] recommends that you schedule a follow-up appointment in: One Year.

## 2014-06-17 NOTE — Progress Notes (Signed)
Patient ID: TWAIN STENSETH, male   DOB: 07-05-1941, 73 y.o.   MRN: [id number]      Reason for office visit Atrial flutter.  Mr. Edelman had new onset atrial flutter with RVR, presenting with pre-syncope, weakness, palpitations and hypotension last week, onset during rehab after knee surgery. HE was readily rate controlled and returned to NSR just before hospital discharge. His echo was normal except for a mildly dilated left atrium. No thyroid disease. He has a remote history of TIA>Has AAA and rheumatoid arthritis on methotrexate. Tolerating Eliquis and beta blocker. He never had angina. CT of chest in 2014 showed coronary calcifications.   No Known Allergies  Current Outpatient Prescriptions  Medication Sig Dispense Refill  . apixaban (ELIQUIS) 5 MG TABS tablet Take 1 tablet (5 mg total) by mouth 2 (two) times daily.  60 tablet  11  . docusate sodium (COLACE) 100 MG capsule Take 100 mg by mouth 2 (two) times daily as needed for mild constipation.      . folic acid (FOLVITE) 1 MG tablet Take 1 mg by mouth every morning.       . lansoprazole (PREVACID) 30 MG capsule Take 30 mg by mouth every morning.       . metoprolol tartrate (LOPRESSOR) 12.5 mg TABS tablet Take 0.5 tablets (12.5 mg total) by mouth 2 (two) times daily.  30 tablet  3  . Multiple Vitamins-Minerals (CENTRUM SILVER ADULT 50+) TABS Take 50 mg by mouth daily.      Marland Kitchen oxyCODONE-acetaminophen (PERCOCET/ROXICET) 5-325 MG per tablet Take 1 tablet by mouth every 4 (four) hours as needed for moderate pain.       No current facility-administered medications for this visit.    Past Medical History  Diagnosis Date  . TIA (transient ischemic attack)     [place]  . DVT (deep venous thrombosis) 2001    Hx of Left leg   . History of palpitations     OCCASIONAL  . GERD (gastroesophageal reflux disease)   . Constipation   . BPH (benign prostatic hyperplasia)   . Frequency of urination   . Kidney stones   . AAA  (abdominal aortic aneurysm)     followed by Dr. Deitra Mayo  . Stroke 1994    "when I had ruptured aneurysm in my head"  . Cerebral aneurysm 1994  . Rheumatoid arthritis     "qwhere"  . Basal cell carcinoma     "LLE; some on my head"  . History of echocardiogram     a. 2D ECHO: 06/11/2014: EF 55-60%. Normal wall thickness. Indeterminate DD ( atrial flutter ). No RWMA. Mild LA dilation. Normal RV size and systolic function. No significant valvular abnormalities.    Past Surgical History  Procedure Laterality Date  . Cerebral aneurysm repair  1994    Hx of ruptured brain aneurysm Tx by Dr. Ellene Route  . Excisional hemorrhoidectomy  1970's  . Cystoscopy w/ stone manipulation  1980's X 1  . Total knee arthroplasty Left 05/14/2014    Procedure: LEFT TOTAL KNEE ARTHROPLASTY;  Surgeon: Johnn Hai, MD;  Location: [facility];  Service: Orthopedics;  Laterality: Left;  . Joint replacement    . Basal cell carcinoma excision      "LLE; top of my head"    Family History  Problem Relation Age of Onset  . Heart disease Father     Aneurysm  . Diabetes Father   . Heart attack Father   . Heart  disease Brother     Heart Disease before age 44  . Cancer Brother     History   Social History  . Marital Status: Widowed    Spouse Name: N/A    Number of Children: N/A  . Years of Education: N/A   Occupational History  . Not on file.   Social History Main Topics  . Smoking status: Former Smoker -- 2.50 packs/day for 37 years    Types: Cigarettes    Quit date: 09/25/1992  . Smokeless tobacco: Never Used  . Alcohol Use: No  . Drug Use: No  . Sexual Activity: Not Currently   Other Topics Concern  . Not on file   Social History Narrative  . No narrative on file    Review of systems: The patient specifically denies any chest pain at rest or with exertion, dyspnea at rest or with exertion, orthopnea, paroxysmal nocturnal dyspnea, syncope, palpitations, focal neurological deficits,  intermittent claudication, lower extremity edema, unexplained weight gain, cough, hemoptysis or wheezing.  The patient also denies abdominal pain, nausea, vomiting, dysphagia, diarrhea, constipation, polyuria, polydipsia, dysuria, hematuria, frequency, urgency, abnormal bleeding or bruising, fever, chills, unexpected weight changes, mood swings, change in skin or hair texture, change in voice quality, auditory or visual problems, allergic reactions or rashes, new musculoskeletal complaints other than expected discomfort in recently operated knee   PHYSICAL EXAM BP 120/71  Pulse 77  Resp 16  Ht 5\' 10"  (1.778 m)  Wt 103.919 kg (229 lb 1.6 oz)  BMI 32.87 kg/m2  General: Alert, oriented x3, no distress Head: no evidence of trauma, PERRL, EOMI, no exophtalmos or lid lag, no myxedema, no xanthelasma; normal ears, nose and oropharynx Neck: normal jugular venous pulsations and no hepatojugular reflux; brisk carotid pulses without delay and no carotid bruits Chest: clear to auscultation, no signs of consolidation by percussion or palpation, normal fremitus, symmetrical and full respiratory excursions Cardiovascular: normal position and quality of the apical impulse, regular rhythm, normal first and second heart sounds, no murmurs, rubs or gallops Abdomen: no tenderness or distention, no masses by palpation, no abnormal pulsatility or arterial bruits, normal bowel sounds, no hepatosplenomegaly Extremities: healing surgical scar left knee; no clubbing, cyanosis or edema; 2+ radial, ulnar and brachial pulses bilaterally; 2+ right femoral, posterior tibial and dorsalis pedis pulses; 2+ left femoral, posterior tibial and dorsalis pedis pulses; no subclavian or femoral bruits Neurological: grossly nonfocal   EKG: NSR with one PAC  Lipid Panel     Component Value Date/Time   CHOL 95 06/12/2014 0050   TRIG 80 06/12/2014 0050   HDL 32* 06/12/2014 0050   CHOLHDL 3.0 06/12/2014 0050   VLDL 16 06/12/2014 0050     LDLCALC 47 06/12/2014 0050    BMET    Component Value Date/Time   NA 139 06/12/2014 0050   K 4.4 06/12/2014 0050   CL 104 06/12/2014 0050   CO2 23 06/12/2014 0050   GLUCOSE 102* 06/12/2014 0050   BUN 13 06/12/2014 0050   CREATININE 1.10 06/12/2014 0050   CALCIUM 8.8 06/12/2014 0050   GFRNONAA 66* 06/12/2014 0050   GFRAA 76* 06/12/2014 0050     ASSESSMENT AND PLAN Atrial flutter with rapid ventricular response This occurred late after knee surgery and is likely to happen again. With history of TIA, CHADSVasc score of 4, recommend lifelong anticoagulation. Consider RF ablation if symptoms are prevalent in the future. Left atrial enlargement suggests possible future atrial fibrillation -would not rush to ablation yet.  Coronary artery calcification seen on CT scan Recommend a functional study for CAD. Due to knee surgery, needs a Lexiscan Myoview. Excellent lipid profile.  AAA asymptomatic  No orders of the defined types were placed in this encounter.   Meds ordered this encounter  Medications  . Multiple Vitamins-Minerals (CENTRUM SILVER ADULT 50+) TABS    Sig: Take 50 mg by mouth daily.    Holli Humbles, MD, [place] [phone] office [phone] pager

## 2014-06-17 NOTE — Assessment & Plan Note (Addendum)
Recommend a functional study for CAD. Due to knee surgery, needs a Lexiscan Myoview. Excellent lipid profile.

## 2014-06-18 ENCOUNTER — Telehealth: Payer: Self-pay | Admitting: *Deleted

## 2014-06-18 DIAGNOSIS — I2510 Atherosclerotic heart disease of native coronary artery without angina pectoris: Secondary | ICD-10-CM

## 2014-06-18 DIAGNOSIS — I2584 Coronary atherosclerosis due to calcified coronary lesion: Principal | ICD-10-CM

## 2014-06-18 NOTE — Telephone Encounter (Signed)
Patient notified Dr. Loletha Grayer suggests a Lexiscan Myoview be done for eval. Coronary calcification.  Patient voiced understanding.

## 2014-06-18 NOTE — Telephone Encounter (Signed)
Message copied by Tressa Busman on Thu Jun 18, 2014  9:43 AM ------      Message from: Sanda Klein      Created: Wed Jun 17, 2014 10:34 PM       Was looking through his records and found a CT chest from 2014 that showed severe coronary calcifications. He should have a Arabi - non-urgent. I will call with results. Still OK to return to rehab for his knee. ------

## 2014-06-18 NOTE — Addendum Note (Signed)
Addended by: Janett Labella A on: 06/18/2014 12:43 PM   Modules accepted: Orders

## 2014-06-18 NOTE — Telephone Encounter (Signed)
Message copied by Tressa Busman on Thu Jun 18, 2014  9:47 AM ------      Message from: Sanda Klein      Created: Wed Jun 17, 2014 10:34 PM       Was looking through his records and found a CT chest from 2014 that showed severe coronary calcifications. He should have a McBride - non-urgent. I will call with results. Still OK to return to rehab for his knee. ------

## 2014-06-18 NOTE — Telephone Encounter (Signed)
Message copied by Tressa Busman on Thu Jun 18, 2014  9:41 AM ------      Message from: Sanda Klein      Created: Wed Jun 17, 2014 10:34 PM       Was looking through his records and found a CT chest from 2014 that showed severe coronary calcifications. He should have a Ripley - non-urgent. I will call with results. Still OK to return to rehab for his knee. ------

## 2014-06-24 ENCOUNTER — Telehealth (HOSPITAL_COMMUNITY): Payer: Self-pay | Admitting: *Deleted

## 2014-06-25 ENCOUNTER — Encounter (HOSPITAL_COMMUNITY): Payer: Self-pay | Admitting: *Deleted

## 2014-07-03 ENCOUNTER — Telehealth (HOSPITAL_COMMUNITY): Payer: Self-pay

## 2014-07-03 NOTE — Telephone Encounter (Signed)
Encounter complete. 

## 2014-07-07 ENCOUNTER — Telehealth (HOSPITAL_COMMUNITY): Payer: Self-pay

## 2014-07-07 NOTE — Telephone Encounter (Signed)
Encounter complete. 

## 2014-07-08 ENCOUNTER — Ambulatory Visit (HOSPITAL_COMMUNITY)
Admission: RE | Admit: 2014-07-08 | Discharge: 2014-07-08 | Disposition: A | Discharge status: Home / Self Care | Payer: Medicare Other | Source: Ambulatory Visit | Attending: Cardiology | Admitting: Cardiology

## 2014-07-08 DIAGNOSIS — I2584 Coronary atherosclerosis due to calcified coronary lesion: Secondary | ICD-10-CM | POA: Diagnosis not present

## 2014-07-08 DIAGNOSIS — R002 Palpitations: Secondary | ICD-10-CM | POA: Diagnosis not present

## 2014-07-08 DIAGNOSIS — R0609 Other forms of dyspnea: Secondary | ICD-10-CM | POA: Insufficient documentation

## 2014-07-08 DIAGNOSIS — I2510 Atherosclerotic heart disease of native coronary artery without angina pectoris: Secondary | ICD-10-CM | POA: Insufficient documentation

## 2014-07-08 MED ORDER — REGADENOSON 0.4 MG/5ML IV SOLN
0.4000 mg | Freq: Once | INTRAVENOUS | Status: AC
  Administered 2014-07-08: 0.4 mg via INTRAVENOUS

## 2014-07-08 MED ORDER — TECHNETIUM TC 99M SESTAMIBI GENERIC - CARDIOLITE
30.0000 | Freq: Once | INTRAVENOUS | Status: AC | PRN
  Administered 2014-07-08: 30 via INTRAVENOUS

## 2014-07-08 MED ORDER — TECHNETIUM TC 99M SESTAMIBI GENERIC - CARDIOLITE
10.0000 | Freq: Once | INTRAVENOUS | Status: AC | PRN
  Administered 2014-07-08: 10 via INTRAVENOUS

## 2014-07-08 NOTE — Procedures (Addendum)
[facility] [address] [place] [address] [phone]  Cardiology Nuclear Med Study  Jeffrey Stephens is a 73 y.o. male     MRN : [id number]     DOB: Jul 07, 1941  Procedure Date: 07/08/2014  Nuclear Med Background Indication for Stress Test:  [facility] and Coronary calcification on CT scan. History:  A-Flutter w/RVR;palpitations;seizures;No prior NUC MPI fo rcomparison;ECHO on 06/11/2014-LVEF-55-60% Cardiac Risk Factors: CVA, Family History - CAD, History of Smoking, Obesity, PVD, TIA and AAA  Symptoms:  DOE, Near Syncope, Palpitations and weakness   Nuclear Pre-Procedure Caffeine/Decaff Intake:  9:00pm NPO After: 7:00am   IV Site: R Forearm  IV 0.9% NS with Angio Cath:  22g  Chest Size (in):  48"  IV Started by: Rolene Course, RN  Height: 5\' 10"  (1.778 m)  Cup Size: n/a  BMI:  Body mass index is 32.86 kg/(m^2). Weight:  229 lb (103.874 kg)   Tech Comments:  n/a    Nuclear Med Study 1 or 2 day study: 1 day  Stress Test Type:  [place] Provider:  Sanda Klein, MD   Resting Radionuclide: Technetium 16m Sestamibi  Resting Radionuclide Dose: 10.1 mCi   Stress Radionuclide:  Technetium 1m Sestamibi  Stress Radionuclide Dose: 31.3 mCi           Stress Protocol Rest HR: 63 Stress HR: 90  Rest BP: 138/76 Stress BP: 138/76  Exercise Time (min): n/a METS: n/a          Dose of Adenosine (mg):  n/a Dose of Lexiscan: 0.4 mg  Dose of Atropine (mg): n/a Dose of Dobutamine: n/a mcg/kg/min (at max HR)  Stress Test Technologist: Mellody Memos, CCT Nuclear Technologist: Otho Perl, CNMT   Rest Procedure:  Myocardial perfusion imaging was performed at rest 45 minutes following the intravenous administration of Technetium 81m Sestamibi. Stress Procedure:  The patient received IV Lexiscan 0.4 mg over 15-seconds.  Technetium 45m Sestamibi injected IV at 30-seconds.  There were no significant  changes with Lexiscan.  Quantitative spect images were obtained after a 45 minute delay.  Transient Ischemic Dilatation (Normal <1.22):  1.21 QGS EDV:  70 ml QGS ESV:  25 ml LV Ejection Fraction: 65%     Rest ECG: NSR - Normal EKG  Stress ECG: No significant change from baseline ECG  QPS Raw Data Images:  There is interference from nuclear activity from structures below the diaphragm. This substantially affects the ability to read the study. Stress Images:  There is decreased uptake in the inferior wall. The defect is moderate in size (basal and mid segments) and moderate in severity Rest Images:  There is improved uptake in the inferior wall. Subtraction (SDS):  These findings are consistent with ischemia. LV Wall Motion:  NL LV Function; NL Wall Motion  Impression Exercise Capacity:  Lexiscan with no exercise. BP Response:  Normal blood pressure response. Clinical Symptoms:  No significant symptoms noted. ECG Impression:  No significant ECG changes with Lexiscan. Comparison with Prior Nuclear Study: No previous nuclear study performed   Overall Impression:  Intermediate risk stress nuclear study with evidence of an inferior wall defect that appears to be reversible. Cannot exclude a fixed defect, as there is substantial infradiaphragmatic visceral uptake, especially on the rest images, that limits inferior wall evaluation.. As wall motion is normal in this area, this could actually represent a diaphragmatic attenuation artifact.   Sanda Klein, MD  07/08/2014 1:10 PM

## 2014-07-13 ENCOUNTER — Encounter: Payer: Self-pay | Admitting: Cardiovascular Disease

## 2014-07-13 ENCOUNTER — Ambulatory Visit (INDEPENDENT_AMBULATORY_CARE_PROVIDER_SITE_OTHER): Payer: Medicare Other | Admitting: Cardiovascular Disease

## 2014-07-13 VITALS — BP 114/66 | HR 76 | Resp 16 | Ht 69.0 in | Wt 231.5 lb

## 2014-07-13 DIAGNOSIS — I714 Abdominal aortic aneurysm, without rupture, unspecified: Secondary | ICD-10-CM

## 2014-07-13 DIAGNOSIS — I4892 Unspecified atrial flutter: Secondary | ICD-10-CM

## 2014-07-13 DIAGNOSIS — I2510 Atherosclerotic heart disease of native coronary artery without angina pectoris: Secondary | ICD-10-CM

## 2014-07-13 DIAGNOSIS — R931 Abnormal findings on diagnostic imaging of heart and coronary circulation: Secondary | ICD-10-CM | POA: Insufficient documentation

## 2014-07-13 HISTORY — DX: Abnormal findings on diagnostic imaging of heart and coronary circulation: R93.1

## 2014-07-13 MED ORDER — ASPIRIN EC 81 MG PO TBEC: 81.0000 mg | DELAYED_RELEASE_TABLET | Freq: Every day | ORAL | Status: DC

## 2014-07-13 NOTE — Patient Instructions (Signed)
STOP Eliquis.  START Aspirin 81mg  daily.  Dr. [name] recommends that you schedule a follow-up appointment in: One year.

## 2014-07-13 NOTE — Progress Notes (Signed)
Patient ID: Jeffrey Stephens, male   DOB: 26-Jun-1941, 73 y.o.   MRN: [id number]     Reason for office visit Followup nuclear stress test, paroxysmal atrial flutter, Coronary artery calcification  Jeffrey Stephens returns for followup on the results of his nuclear stress test. This was prompted by incidental discovery of coronary artery calcification on a CT scan performed to exclude pulmonary embolism. I turned the study was performed in the setting of paroxysmal atrial flutter in the immediate postoperative period after knee replacement surgery. He does not have any symptoms of coronary artery disease or congestive heart failure. He is recovering well from his knee surgery he is becoming more and more active. He has not had any significant palpitations. He has not had any bleeding problems on treatment with Eliquis.  Unfortunately, his nuclear study was an equivocal one. There was substantial interference with evaluation of the inferior wall due to increased intestinal activity. Wall motion was normal on both her nuclear study and echocardiography, with an ejection fraction of 50-60%. I think it is most likely that he simply had a diaphragmatic attenuation artifact that appears to be reversible do to increased intestinal activity on the rest images. I cannot exclude inferior wall ischemia.  Since he does not have angina pectoris and this was an incidental finding, revascularization is not indicated at this time. He has an excellent lipid profile. He does not smoke. No major surgeries are planned in the near future.   No Known Allergies  Current Outpatient Prescriptions  Medication Sig Dispense Refill  . docusate sodium (COLACE) 100 MG capsule Take 100 mg by mouth 2 (two) times daily as needed for mild constipation.      . folic acid (FOLVITE) 1 MG tablet Take 1 mg by mouth every morning.       . lansoprazole (PREVACID) 30 MG capsule Take 30 mg by mouth every morning.       . metoprolol tartrate  (LOPRESSOR) 12.5 mg TABS tablet Take 0.5 tablets (12.5 mg total) by mouth 2 (two) times daily.  30 tablet  3  . Multiple Vitamins-Minerals (CENTRUM SILVER ADULT 50+) TABS Take 50 mg by mouth daily.      Marland Kitchen oxyCODONE-acetaminophen (PERCOCET/ROXICET) 5-325 MG per tablet Take 1 tablet by mouth every 4 (four) hours as needed for moderate pain.      Marland Kitchen aspirin EC 81 MG tablet Take 1 tablet (81 mg total) by mouth daily.  90 tablet  3   No current facility-administered medications for this visit.    Past Medical History  Diagnosis Date  . TIA (transient ischemic attack)     [place]  . DVT (deep venous thrombosis) 2001    Hx of Left leg   . History of palpitations     OCCASIONAL  . GERD (gastroesophageal reflux disease)   . Constipation   . BPH (benign prostatic hyperplasia)   . Frequency of urination   . Kidney stones   . AAA (abdominal aortic aneurysm)     followed by Dr. Deitra Mayo  . Stroke 1994    "when I had ruptured aneurysm in my head"  . Cerebral aneurysm 1994  . Rheumatoid arthritis     "qwhere"  . Basal cell carcinoma     "LLE; some on my head"  . History of echocardiogram     a. 2D ECHO: 06/11/2014: EF 55-60%. Normal wall thickness. Indeterminate DD ( atrial flutter ). No RWMA. Mild LA dilation. Normal RV size and systolic  function. No significant valvular abnormalities.  . Abnormal nuclear cardiac imaging test 07/13/2014    Past Surgical History  Procedure Laterality Date  . Cerebral aneurysm repair  1994    Hx of ruptured brain aneurysm Tx by Dr. Ellene Route  . Excisional hemorrhoidectomy  1970's  . Cystoscopy w/ stone manipulation  1980's X 1  . Total knee arthroplasty Left 05/14/2014    Procedure: LEFT TOTAL KNEE ARTHROPLASTY;  Surgeon: Johnn Hai, MD;  Location: [facility];  Service: Orthopedics;  Laterality: Left;  . Joint replacement    . Basal cell carcinoma excision      "LLE; top of my head"    Family History  Problem Relation Age of Onset    . Heart disease Father     Aneurysm  . Diabetes Father   . Heart attack Father   . Heart disease Brother     Heart Disease before age 54  . Cancer Brother     History   Social History  . Marital Status: Widowed    Spouse Name: N/A    Number of Children: N/A  . Years of Education: N/A   Occupational History  . Not on file.   Social History Main Topics  . Smoking status: Former Smoker -- 2.50 packs/day for 37 years    Types: Cigarettes    Quit date: 09/25/1992  . Smokeless tobacco: Never Used  . Alcohol Use: No  . Drug Use: No  . Sexual Activity: Not Currently   Other Topics Concern  . Not on file   Social History Narrative  . No narrative on file    Review of systems: The patient specifically denies any chest pain at rest or with exertion, dyspnea at rest or with exertion, orthopnea, paroxysmal nocturnal dyspnea, syncope, palpitations, focal neurological deficits, intermittent claudication, lower extremity edema, unexplained weight gain, cough, hemoptysis or wheezing.  The patient also denies abdominal pain, nausea, vomiting, dysphagia, diarrhea, constipation, polyuria, polydipsia, dysuria, hematuria, frequency, urgency, abnormal bleeding or bruising, fever, chills, unexpected weight changes, mood swings, change in skin or hair texture, change in voice quality, auditory or visual problems, allergic reactions or rashes, new musculoskeletal complaints other than expected discomfort in recently operated knee   PHYSICAL EXAM BP 114/66  Pulse 76  Resp 16  Ht 5\' 9"  (1.753 m)  Wt 105.008 kg (231 lb 8 oz)  BMI 34.17 kg/m2 General: Alert, oriented x3, no distress  Head: no evidence of trauma, PERRL, EOMI, no exophtalmos or lid lag, no myxedema, no xanthelasma; normal ears, nose and oropharynx  Neck: normal jugular venous pulsations and no hepatojugular reflux; brisk carotid pulses without delay and no carotid bruits  Chest: clear to auscultation, no signs of consolidation  by percussion or palpation, normal fremitus, symmetrical and full respiratory excursions  Cardiovascular: normal position and quality of the apical impulse, regular rhythm, normal first and second heart sounds, no murmurs, rubs or gallops  Abdomen: no tenderness or distention, no masses by palpation, no abnormal pulsatility or arterial bruits, normal bowel sounds, no hepatosplenomegaly  Extremities: healing surgical scar left knee; no clubbing, cyanosis or edema; 2+ radial, ulnar and brachial pulses bilaterally; 2+ right femoral, posterior tibial and dorsalis pedis pulses; 2+ left femoral, posterior tibial and dorsalis pedis pulses; no subclavian or femoral bruits  Neurological: grossly nonfocal   Lipid Panel     Component Value Date/Time   CHOL 95 06/12/2014 0050   TRIG 80 06/12/2014 0050   HDL 32* 06/12/2014 0050   CHOLHDL  3.0 06/12/2014 0050   VLDL 16 06/12/2014 0050   LDLCALC 47 06/12/2014 0050    BMET    Component Value Date/Time   NA 139 06/12/2014 0050   K 4.4 06/12/2014 0050   CL 104 06/12/2014 0050   CO2 23 06/12/2014 0050   GLUCOSE 102* 06/12/2014 0050   BUN 13 06/12/2014 0050   CREATININE 1.10 06/12/2014 0050   CALCIUM 8.8 06/12/2014 0050   GFRNONAA 66* 06/12/2014 0050   GFRAA 76* 06/12/2014 0050     ASSESSMENT AND PLAN Abnormal nuclear stress test I have asked him to call me if he develops chest pain, especially if this is exertion related. If this should happen, he should go directly to coronary angiography to avoid the confusion related to the nuclear stress test. Similarly, if he ever requires a major surgical procedure with high cardiovascular risk (for example surgical repair of his infrarenal dominant aortic aneurysm) I would also recommend preoperative angiography.  Otherwise, even if his perfusion abnormality is real, in the absence of angina pectoris or left ventricular dysfunction I don't think he would benefit from revascularization. I would not recommend invasive  evaluation at this time, except in the circumstances outlined above.  Paroxysmal atrial flutter He specifically requests to stop the anticoagulant. He has only had one documented episode of atrial arrhythmia and this occurred in the immediate postoperative period after knee surgery. His CHADSVasc score is 2. (he had a previous small stroke but this was a direct complication of an invasive cerebral angiographic procedure in the remote past). We reviewed the benefit of anti-coagulation therapy in stroke prevention and the risk of bleeding. He also reviewed the statistical results for aspirin therapy. It appears that the benefit gained is pretty much exactly counterbalanced by an increased risk of bleeding.  He clearly does not want to take a potent anticoagulant and expensive medication long-term. He will discontinue the Eliquis and take aspirin 81 mg daily. I have recommended that he continue taking the metoprolol for the time being and we will discuss whether this is necessary as a long-term medication depending on the prevalence of future arrhythmia.  Meds ordered this encounter  Medications  . aspirin EC 81 MG tablet    Sig: Take 1 tablet (81 mg total) by mouth daily.    Dispense:  90 tablet    Refill:  [place] Vasilios Ottaway, MD, [facility] [phone] office [phone] pager

## 2014-07-20 ENCOUNTER — Ambulatory Visit: Payer: Medicare Other | Admitting: Cardiovascular Disease

## 2014-08-12 ENCOUNTER — Telehealth: Payer: Self-pay | Admitting: Cardiovascular Disease

## 2014-08-12 ENCOUNTER — Inpatient Hospital Stay (HOSPITAL_COMMUNITY)
Admission: EM | Admit: 2014-08-12 | Discharge: 2014-08-14 | DRG: 310 | Disposition: A | Discharge status: Home / Self Care | Payer: Medicare Other | Attending: Internal Medicine | Admitting: Internal Medicine

## 2014-08-12 ENCOUNTER — Encounter (HOSPITAL_COMMUNITY): Payer: Self-pay | Admitting: *Deleted

## 2014-08-12 DIAGNOSIS — I4892 Unspecified atrial flutter: Principal | ICD-10-CM | POA: Diagnosis present

## 2014-08-12 DIAGNOSIS — Z86718 Personal history of other venous thrombosis and embolism: Secondary | ICD-10-CM

## 2014-08-12 DIAGNOSIS — Z96652 Presence of left artificial knee joint: Secondary | ICD-10-CM | POA: Diagnosis present

## 2014-08-12 DIAGNOSIS — N400 Enlarged prostate without lower urinary tract symptoms: Secondary | ICD-10-CM | POA: Diagnosis present

## 2014-08-12 DIAGNOSIS — Z79899 Other long term (current) drug therapy: Secondary | ICD-10-CM

## 2014-08-12 DIAGNOSIS — Z85828 Personal history of other malignant neoplasm of skin: Secondary | ICD-10-CM

## 2014-08-12 DIAGNOSIS — Z8673 Personal history of transient ischemic attack (TIA), and cerebral infarction without residual deficits: Secondary | ICD-10-CM

## 2014-08-12 DIAGNOSIS — K219 Gastro-esophageal reflux disease without esophagitis: Secondary | ICD-10-CM | POA: Diagnosis present

## 2014-08-12 DIAGNOSIS — I714 Abdominal aortic aneurysm, without rupture, unspecified: Secondary | ICD-10-CM | POA: Diagnosis present

## 2014-08-12 DIAGNOSIS — I2510 Atherosclerotic heart disease of native coronary artery without angina pectoris: Secondary | ICD-10-CM | POA: Diagnosis present

## 2014-08-12 DIAGNOSIS — Z7901 Long term (current) use of anticoagulants: Secondary | ICD-10-CM

## 2014-08-12 DIAGNOSIS — I959 Hypotension, unspecified: Secondary | ICD-10-CM | POA: Diagnosis present

## 2014-08-12 DIAGNOSIS — R42 Dizziness and giddiness: Secondary | ICD-10-CM | POA: Diagnosis not present

## 2014-08-12 DIAGNOSIS — Z87891 Personal history of nicotine dependence: Secondary | ICD-10-CM

## 2014-08-12 DIAGNOSIS — R931 Abnormal findings on diagnostic imaging of heart and coronary circulation: Secondary | ICD-10-CM

## 2014-08-12 DIAGNOSIS — M069 Rheumatoid arthritis, unspecified: Secondary | ICD-10-CM | POA: Diagnosis present

## 2014-08-12 DIAGNOSIS — I480 Paroxysmal atrial fibrillation: Secondary | ICD-10-CM | POA: Diagnosis present

## 2014-08-12 LAB — CBC
HCT: 44.2 % (ref 39.0–52.0)
Hemoglobin: 15.2 g/dL (ref 13.0–17.0)
MCH: 31.2 pg (ref 26.0–34.0)
MCHC: 34.4 g/dL (ref 30.0–36.0)
MCV: 90.8 fL (ref 78.0–100.0)
Platelets: 229 10*3/uL (ref 150–400)
RBC: 4.87 MIL/uL (ref 4.22–5.81)
RDW: 15 % (ref 11.5–15.5)
WBC: 9.1 10*3/uL (ref 4.0–10.5)

## 2014-08-12 LAB — BASIC METABOLIC PANEL
ANION GAP: 13 (ref 5–15)
BUN: 14 mg/dL (ref 6–23)
CO2: 24 mEq/L (ref 19–32)
CREATININE: 1.09 mg/dL (ref 0.50–1.35)
Calcium: 9.2 mg/dL (ref 8.4–10.5)
Chloride: 101 mEq/L (ref 96–112)
GFR, EST AFRICAN AMERICAN: 76 mL/min — AB (ref >90)
GFR, EST NON AFRICAN AMERICAN: 66 mL/min — AB (ref >90)
Glucose, Bld: 95 mg/dL (ref 70–99)
Potassium: 4.9 mEq/L (ref 3.7–5.3)
SODIUM: 138 meq/L (ref 137–147)

## 2014-08-12 LAB — I-STAT TROPONIN, ED: TROPONIN I, POC: 0 ng/mL (ref 0.00–0.08)

## 2014-08-12 LAB — TROPONIN I: Troponin I: <0.3 ng/mL (ref <0.30)

## 2014-08-12 LAB — PRO B NATRIURETIC PEPTIDE: Pro B Natriuretic peptide (BNP): 1027 pg/mL — ABNORMAL HIGH (ref 0–125)

## 2014-08-12 MED ORDER — ACETAMINOPHEN 325 MG PO TABS: 650.0000 mg | ORAL_TABLET | ORAL | Status: DC | PRN

## 2014-08-12 MED ORDER — SODIUM CHLORIDE 0.9 % IJ SOLN
3.0000 mL | INTRAMUSCULAR | Status: DC | PRN
Start: 2014-08-12 — End: 2014-08-14

## 2014-08-12 MED ORDER — OXYCODONE-ACETAMINOPHEN 5-325 MG PO TABS: 0.5000 | ORAL_TABLET | ORAL | Status: DC | PRN

## 2014-08-12 MED ORDER — HEPARIN (PORCINE) IN NACL 100-0.45 UNIT/ML-% IJ SOLN
1250.0000 [IU]/h | INTRAMUSCULAR | Status: DC
  Filled 2014-08-12: qty 250

## 2014-08-12 MED ORDER — FOLIC ACID 1 MG PO TABS: 1.0000 mg | ORAL_TABLET | Freq: Every day | ORAL | Status: DC

## 2014-08-12 MED ORDER — SODIUM CHLORIDE 0.9 % IV BOLUS (SEPSIS)
500.0000 mL | Freq: Once | INTRAVENOUS | Status: AC
  Administered 2014-08-12: 500 mL via INTRAVENOUS

## 2014-08-12 MED ORDER — DOCUSATE SODIUM 100 MG PO CAPS: 100.0000 mg | ORAL_CAPSULE | Freq: Two times a day (BID) | ORAL | Status: DC | PRN

## 2014-08-12 MED ORDER — ADULT MULTIVITAMIN W/MINERALS CH
1.0000 | ORAL_TABLET | Freq: Every day | ORAL | Status: DC
  Administered 2014-08-13: 1 via ORAL
  Filled 2014-08-12 (×2): qty 1

## 2014-08-12 MED ORDER — ONDANSETRON HCL 4 MG/2ML IJ SOLN: 4.0000 mg | Freq: Four times a day (QID) | INTRAMUSCULAR | Status: DC | PRN

## 2014-08-12 MED ORDER — METHOTREXATE 2.5 MG PO TABS: 12.5000 mg | ORAL_TABLET | ORAL | Status: DC

## 2014-08-12 MED ORDER — SODIUM CHLORIDE 0.9 % IV SOLN
Freq: Once | INTRAVENOUS | Status: AC
  Administered 2014-08-12: 12:00:00 via INTRAVENOUS

## 2014-08-12 MED ORDER — APIXABAN 5 MG PO TABS
5.0000 mg | ORAL_TABLET | Freq: Two times a day (BID) | ORAL | Status: DC
  Administered 2014-08-12 – 2014-08-14 (×5): 5 mg via ORAL
  Filled 2014-08-12 (×5): qty 1

## 2014-08-12 MED ORDER — PANTOPRAZOLE SODIUM 40 MG PO TBEC
40.0000 mg | DELAYED_RELEASE_TABLET | Freq: Every day | ORAL | Status: DC
  Administered 2014-08-13 – 2014-08-14 (×2): 40 mg via ORAL
  Filled 2014-08-12 (×2): qty 1

## 2014-08-12 MED ORDER — TAMSULOSIN HCL 0.4 MG PO CAPS: 0.4000 mg | ORAL_CAPSULE | Freq: Every day | ORAL | Status: DC | PRN

## 2014-08-12 MED ORDER — SOTALOL HCL 120 MG PO TABS
120.0000 mg | ORAL_TABLET | Freq: Two times a day (BID) | ORAL | Status: DC
  Administered 2014-08-12 – 2014-08-14 (×4): 120 mg via ORAL
  Filled 2014-08-12 (×6): qty 1

## 2014-08-12 MED ORDER — CENTRUM SILVER ADULT 50+ PO TABS: 50.0000 mg | ORAL_TABLET | Freq: Every day | ORAL | Status: DC

## 2014-08-12 MED ORDER — FOLIC ACID 1 MG PO TABS
1.0000 mg | ORAL_TABLET | Freq: Every day | ORAL | Status: DC
  Administered 2014-08-13 – 2014-08-14 (×2): 1 mg via ORAL
  Filled 2014-08-12 (×2): qty 1

## 2014-08-12 MED ORDER — HEPARIN BOLUS VIA INFUSION
4000.0000 [IU] | Freq: Once | INTRAVENOUS | Status: DC
  Filled 2014-08-12: qty 4000

## 2014-08-12 MED ORDER — SODIUM CHLORIDE 0.9 % IV SOLN
250.0000 mL | INTRAVENOUS | Status: DC
  Administered 2014-08-12: 250 mL via INTRAVENOUS

## 2014-08-12 MED ORDER — SODIUM CHLORIDE 0.9 % IJ SOLN
3.0000 mL | Freq: Two times a day (BID) | INTRAMUSCULAR | Status: DC
  Administered 2014-08-12 – 2014-08-13 (×3): 3 mL via INTRAVENOUS

## 2014-08-12 MED ORDER — METOPROLOL TARTRATE 1 MG/ML IV SOLN: 5.0000 mg | Freq: Once | INTRAVENOUS | Status: DC

## 2014-08-12 NOTE — Progress Notes (Signed)
ANTICOAGULATION CONSULT NOTE - Initial Consult  Pharmacy Consult for heparin Indication: atrial fibrillation  No Known Allergies  Patient Measurements: Height: 5\' 10"  (177.8 cm) Weight: 230 lb (104.327 kg) IBW/kg (Calculated) : 73 Heparin Dosing Weight: 95kg  Vital Signs: Temp: 97.6 F (36.4 C) (11/18 1101) Temp Source: Oral (11/18 1101) BP: 99/77 mmHg (11/18 1215) Pulse Rate: 77 (11/18 1215)  Labs:  Recent Labs  08/12/14 1139  HGB 15.2  HCT 44.2  PLT 229  CREATININE 1.09  TROPONINI <0.30    Estimated Creatinine Clearance: 74.1 mL/min (by C-G formula based on Cr of 1.09).   Medical History: Past Medical History  Diagnosis Date  . TIA (transient ischemic attack)     [place]  . DVT (deep venous thrombosis) 2001    Hx of Left leg   . History of palpitations     OCCASIONAL  . GERD (gastroesophageal reflux disease)   . Constipation   . BPH (benign prostatic hyperplasia)   . Frequency of urination   . Kidney stones   . AAA (abdominal aortic aneurysm)     followed by Dr. Deitra Mayo  . Stroke 1994    "when I had ruptured aneurysm in my head"  . Cerebral aneurysm 1994  . Rheumatoid arthritis     "qwhere"  . Basal cell carcinoma     "LLE; some on my head"  . History of echocardiogram     a. 2D ECHO: 06/11/2014: EF 55-60%. Normal wall thickness. Indeterminate DD ( atrial flutter ). No RWMA. Mild LA dilation. Normal RV size and systolic function. No significant valvular abnormalities.  . Abnormal nuclear cardiac imaging test 07/13/2014     Assessment: 64 YOM brought in by neighbor with new onset dizziness without CP or SOB. Has hx of irregular HR and felt that way this morning. PCP instructed patient to come to ED. Found to be in AFlutter and to start heparin.  Per patient, he was on Eliquis 5mg  BID but this was stopped yesterday (last dose yesterday morning ~0830) and he resumed a low dose ASA regimen. A note from Dr. [name] dated 10/19  states this was the plan- patient had just refilled his Eliquis, so he finished what he had already purchased and then made the transition to ASA yesterday.  To start heparin now while actively in AFlutter. Baseline hgb 15.75m, plts 229. No bleeding or bruising noted. Baseline INR 1.19, PT 15.1.   Goal of Therapy:  Heparin level 0.3-0.7 units/ml Monitor platelets by anticoagulation protocol: Yes   Plan:  1. Start heparin with 4000 unit bolus IV x1, then start infusion at 1250units/hr 2. Heparin level and aPTT at 2100 tonight- want to ensure Eliquis is still not affecting heparin level (doubt since it has been >24h since last dose, but could still be showing some effect) 3. Daily HL and CBC 4. Follow for cards long term plan regarding anticoagulation. Patient asked me about this when I came by to clarify his Eliquis dosing. I told him this was up to cards and they would weigh the risks/benefits of starting or not starting AC. Explained a little about Coumadin to him as he asked about the monitoring.  Jeffrey Stephens D. Emiliya Chretien, PharmD, BCPS Clinical Pharmacist Pager: [phone] 08/12/2014 1:00 PM

## 2014-08-12 NOTE — ED Notes (Signed)
Brought pt back to room via wheelchair; pt undressed, in gown on monitor; Charlett Nose, EMT present in room

## 2014-08-12 NOTE — ED Provider Notes (Signed)
CSN: [id number]     Arrival date & time 08/12/14  1057 History   First MD Initiated Contact with Patient 08/12/14 1121     Chief Complaint  Patient presents with  . Dizziness  . Irregular Heart Beat     HPI  Patient presents for evaluation of low blood pressures and atrial flutter.  Patient has history of left total knee arthroplasty September. Had an episode of paroxysmal atrial flutter after this. Seen by cardiology. Was on anticoagulants and metoprolol. Has been in sinus rhythm and thus is off of anticoagulants. This morning awakened. Is not feeling palpitations. However is quite orthostatic and weak. Had to have help getting to a doctor's appointment today. Found to be in variable atrial flutter and hypotensive. Transferred here. No pain, no shortness of breath, no peripheral edema.  Past Medical History  Diagnosis Date  . TIA (transient ischemic attack)     [place]  . DVT (deep venous thrombosis) 2001    Hx of Left leg   . History of palpitations     OCCASIONAL  . GERD (gastroesophageal reflux disease)   . Constipation   . BPH (benign prostatic hyperplasia)   . Frequency of urination   . Kidney stones   . AAA (abdominal aortic aneurysm)     followed by Dr. Deitra Mayo  . Stroke 1994    "when I had ruptured aneurysm in my head"  . Cerebral aneurysm 1994  . Rheumatoid arthritis     "qwhere"  . Basal cell carcinoma     "LLE; some on my head"  . History of echocardiogram     a. 2D ECHO: 06/11/2014: EF 55-60%. Normal wall thickness. Indeterminate DD ( atrial flutter ). No RWMA. Mild LA dilation. Normal RV size and systolic function. No significant valvular abnormalities.  . Abnormal nuclear cardiac imaging test 07/13/2014   Past Surgical History  Procedure Laterality Date  . Cerebral aneurysm repair  1994    Hx of ruptured brain aneurysm Tx by Dr. Ellene Route  . Excisional hemorrhoidectomy  1970's  . Cystoscopy w/ stone manipulation  1980's X 1  . Total  knee arthroplasty Left 05/14/2014    Procedure: LEFT TOTAL KNEE ARTHROPLASTY;  Surgeon: Johnn Hai, MD;  Location: [facility];  Service: Orthopedics;  Laterality: Left;  . Joint replacement    . Basal cell carcinoma excision      "LLE; top of my head"   Family History  Problem Relation Age of Onset  . Heart disease Father     Aneurysm  . Diabetes Father   . Heart attack Father   . Heart disease Brother     Heart Disease before age 79  . Cancer Brother    History  Substance Use Topics  . Smoking status: Former Smoker -- 2.50 packs/day for 37 years    Types: Cigarettes    Quit date: 09/25/1992  . Smokeless tobacco: Never Used  . Alcohol Use: No    Review of Systems  Constitutional: Negative for fever, chills, diaphoresis, appetite change and fatigue.  HENT: Negative for mouth sores, sore throat and trouble swallowing.   Eyes: Negative for visual disturbance.  Respiratory: Negative for cough, chest tightness, shortness of breath and wheezing.   Cardiovascular: Negative for chest pain.       Weakness,orthostasis.  Gastrointestinal: Negative for nausea, vomiting, abdominal pain, diarrhea and abdominal distention.  Endocrine: Negative for polydipsia, polyphagia and polyuria.  Genitourinary: Negative for dysuria, frequency and hematuria.  Musculoskeletal: Negative  for gait problem.  Skin: Negative for color change, pallor and rash.  Neurological: Positive for dizziness and light-headedness. Negative for syncope and headaches.  Hematological: Does not bruise/bleed easily.  Psychiatric/Behavioral: Negative for behavioral problems and confusion.      Allergies  Review of patient's allergies indicates no known allergies.  Home Medications   Prior to Admission medications   Medication Sig Start Date End Date Taking? Authorizing Provider  docusate sodium (COLACE) 100 MG capsule Take 100 mg by mouth 2 (two) times daily as needed for mild constipation. 05/14/14  Yes Johnn Hai, MD  folic acid (FOLVITE) 1 MG tablet Take 1 mg by mouth every morning.    Yes Historical Provider, MD  lansoprazole (PREVACID) 30 MG capsule Take 30 mg by mouth every morning.    Yes Historical Provider, MD  methotrexate (RHEUMATREX) 2.5 MG tablet Take 12.5 mg by mouth every Monday. 07/29/14  Yes Historical Provider, MD  metoprolol tartrate (LOPRESSOR) 12.5 mg TABS tablet Take 0.5 tablets (12.5 mg total) by mouth 2 (two) times daily. 06/12/14  Yes Eileen Stanford, PA-C  Multiple Vitamins-Minerals (CENTRUM SILVER ADULT 50+) TABS Take 50 mg by mouth daily.   Yes Historical Provider, MD  oxyCODONE-acetaminophen (PERCOCET/ROXICET) 5-325 MG per tablet Take 0.5-1 tablets by mouth every 4 (four) hours as needed for moderate pain.  05/14/14  Yes Johnn Hai, MD  tamsulosin (FLOMAX) 0.4 MG CAPS capsule Take 0.4 mg by mouth daily as needed (for prostate).  07/13/14  Yes Historical Provider, MD   BP 115/66 mmHg  Pulse 77  Temp(Src) 97.6 F (36.4 C) (Oral)  Resp 15  Ht 5\' 10"  (1.778 m)  Wt 230 lb (104.327 kg)  BMI 33.00 kg/m2  SpO2 99% Physical Exam  Constitutional: He is oriented to person, place, and time. He appears well-developed and well-nourished. No distress.  HENT:  Head: Normocephalic.  Eyes: Conjunctivae are normal. Pupils are equal, round, and reactive to light. No scleral icterus.  Neck: Normal range of motion. Neck supple. No thyromegaly present.  Cardiovascular: An irregular rhythm present. Tachycardia present.  Exam reveals no gallop and no friction rub.   No murmur heard. Irregular rhythm. Variable block A flutter noted.  Pulmonary/Chest: Effort normal and breath sounds normal. No respiratory distress. He has no wheezes. He has no rales.  Abdominal: Soft. Bowel sounds are normal. He exhibits no distension. There is no tenderness. There is no rebound.  Musculoskeletal: Normal range of motion.  Neurological: He is alert and oriented to person, place, and time.  Skin: Skin  is warm and dry. No rash noted.  Psychiatric: He has a normal mood and affect. His behavior is normal.    ED Course  Procedures (including critical care time) Labs Review Labs Reviewed  BASIC METABOLIC PANEL - Abnormal; Notable for the following:    GFR calc non Af Amer 66 (*)    GFR calc Af Amer 76 (*)    All other components within normal limits  PRO B NATRIURETIC PEPTIDE - Abnormal; Notable for the following:    Pro B Natriuretic peptide (BNP) 1027.0 (*)    All other components within normal limits  CBC  TROPONIN I  I-STAT TROPOININ, ED    Imaging Review No results found.   EKG Interpretation   Date/Time:  Wednesday August 12 2014 11:01:29 EST Ventricular Rate:  116 PR Interval:    QRS Duration: 86 QT Interval:  368 QTC Calculation: 511 R Axis:   86 Text Interpretation:  Atrial flutter with variable AV block Low voltage  QRS Abnormal ECG Confirmed by Jeneen Rinks  MD, [place] ([id number]) on 08/12/2014  3:03:31 PM      MDM   Final diagnoses:  None    Patient's pressures improved with IV fluids. Remained relatively symptomatically and supine or sitting. Dr. Debara Pickett here evaluating the patient planning cardioversion.    Tanna Furry, MD 08/12/14 [phone]

## 2014-08-12 NOTE — ED Notes (Signed)
Attempted report 

## 2014-08-12 NOTE — H&P (Signed)
Patient ID: Jeffrey Stephens MRN: [id number], DOB/AGE: 72-May-1942   Admit date: 08/12/2014   Primary Physician: Irven Shelling, MD Primary Cardiologist: Dr. [name]  Pt. Profile:  73 year old Caucasian male with PMH of DVT, hemorrhagic stroke secondary to brain aneurysm status post clipping, AAA, and PAF came in with a-fib with RVR, improved after IVF hydration  Problem List  Past Medical History  Diagnosis Date  . TIA (transient ischemic attack)     [name]  . DVT (deep venous thrombosis) 2001    Hx of Left leg   . History of palpitations     OCCASIONAL  . GERD (gastroesophageal reflux disease)   . Constipation   . BPH (benign prostatic hyperplasia)   . Frequency of urination   . Kidney stones   . AAA (abdominal aortic aneurysm)     followed by Dr. Deitra Mayo  . Stroke 1994    "when I had ruptured aneurysm in my head"  . Cerebral aneurysm 1994  . Rheumatoid arthritis     "qwhere"  . Basal cell carcinoma     "LLE; some on my head"  . History of echocardiogram     a. 2D ECHO: 06/11/2014: EF 55-60%. Normal wall thickness. Indeterminate DD ( atrial flutter ). No RWMA. Mild LA dilation. Normal RV size and systolic function. No significant valvular abnormalities.  . Abnormal nuclear cardiac imaging test 07/13/2014    Past Surgical History  Procedure Laterality Date  . Cerebral aneurysm repair  1994    Hx of ruptured brain aneurysm Tx by Dr. Ellene Route  . Excisional hemorrhoidectomy  1970's  . Cystoscopy w/ stone manipulation  1980's X 1  . Total knee arthroplasty Left 05/14/2014    Procedure: LEFT TOTAL KNEE ARTHROPLASTY;  Surgeon: Johnn Hai, MD;  Location: [facility];  Service: Orthopedics;  Laterality: Left;  . Joint replacement    . Basal cell carcinoma excision      "LLE; top of my head"     Allergies  No Known Allergies  HPI  The patient is a 73 year old Caucasian male with PMH of DVT, hemorrhagic stroke secondary to brain  aneurysm status post clipping, AAA, and PAF. Of note, he underwent L knee surgery in August 2015. Postoperatively, he had paroxysmal atrial flutter. Given his CHA2DS2-Vasc score of 2, he was placed on metoprolol and Eliquis. His last echocardiogram on 06/11/2014 showed EF 55-60%, mildly dilated LA, no significant valvular disease. He recently underwent outpatient nuclear stress test on 07/08/2014 as result of incidental finding of coronary calcification of CT. Myoview showed EF 65%, questionable inferior wall defect that appears to be reversible. Per Dr. [name], given excessive intestinal activity, this could represent diaphragmatic attenuation artifact. He was seen by Dr. [name] on 07/13/2014. Given that the patient does not smoke, no exertional symptom, and no heart failure symptoms, it was felt that cardiac catheterization is not indicated at this time. However, future cardiac catheterization would be indicated if the patient has exertional symptom or if he need to undergo high risk surgical procedure.   According to the patient, he has since recovered well after his left knee surgery in August. He denied significant exertional symptom, lower extremity edema, orthopnea or paroxysmal nocturnal dyspnea. He states he has not been as active as prior to the knee surgery. During the last office visit, he had a discussion with Dr. [name] regarding the need to continue Eliquis. Patient wished to come off Eliquis at this time. Given the fact that he  has not had any recurrent a-flutter, and previous atrial flutter was in the setting of post surgical procedure, he was deemed stable enough to transition to aspirin after discussing risk of bleeding and stroke. He states his last dose of Eliquis was in the morning of 08/11/2014.  Patient woke up in the morning of 08/12/2014 with dizziness. He states the degree of dizziness increased with change from sitting to standing position. He got up and went to the bathroom and  the dizziness got worse. Patient calls cardiologist office who recommended him to seek medical attention at [facility]. On arrival, patient was hypotensive with systolic blood pressure in the 80s. Heart rate was elevated at 110s. EKG and telemetry both identified atrial flutter. He was given IV fluid hydration with improvement in heart rate which is now down to 80s. Cardiology service has been consulted for paroxysmal atrial flutter.   Home Medications  Prior to Admission medications   Medication Sig Start Date End Date Taking? Authorizing Provider  aspirin EC 81 MG tablet Take 1 tablet (81 mg total) by mouth daily. 07/13/14  Yes [name], MD  docusate sodium (COLACE) 100 MG capsule Take 100 mg by mouth 2 (two) times daily as needed for mild constipation. 05/14/14  Yes Johnn Hai, MD  folic acid (FOLVITE) 1 MG tablet Take 1 mg by mouth every morning.    Yes Historical Provider, MD  lansoprazole (PREVACID) 30 MG capsule Take 30 mg by mouth every morning.    Yes Historical Provider, MD  methotrexate (RHEUMATREX) 2.5 MG tablet Take 12.5 mg by mouth every Monday. 07/29/14  Yes Historical Provider, MD  metoprolol tartrate (LOPRESSOR) 12.5 mg TABS tablet Take 0.5 tablets (12.5 mg total) by mouth 2 (two) times daily. 06/12/14  Yes Eileen Stanford, PA-C  Multiple Vitamins-Minerals (CENTRUM SILVER ADULT 50+) TABS Take 50 mg by mouth daily.   Yes Historical Provider, MD  oxyCODONE-acetaminophen (PERCOCET/ROXICET) 5-325 MG per tablet Take 0.5-1 tablets by mouth every 4 (four) hours as needed for moderate pain.  05/14/14  Yes Johnn Hai, MD  tamsulosin (FLOMAX) 0.4 MG CAPS capsule Take 0.4 mg by mouth daily as needed (for prostate).  07/13/14  Yes Historical Provider, MD    Family History  Family History  Problem Relation Age of Onset  . Heart disease Father     Aneurysm  . Diabetes Father   . Heart attack Father   . Heart disease Brother     Heart Disease before age 37  .  Cancer Brother     Social History  History   Social History  . Marital Status: Widowed    Spouse Name: N/A    Number of Children: N/A  . Years of Education: N/A   Occupational History  . Not on file.   Social History Main Topics  . Smoking status: Former Smoker -- 2.50 packs/day for 37 years    Types: Cigarettes    Quit date: 09/25/1992  . Smokeless tobacco: Never Used  . Alcohol Use: No  . Drug Use: No  . Sexual Activity: Not Currently   Other Topics Concern  . Not on file   Social History Narrative     Review of Systems General:  No chills, fever, night sweats or weight changes.  Cardiovascular:  No chest pain, dyspnea on exertion, edema, orthopnea, palpitations, paroxysmal nocturnal dyspnea. Dermatological: No rash, lesions/masses Respiratory: No cough, dyspnea Urologic: No hematuria, dysuria Abdominal:   No nausea, vomiting, diarrhea, bright red blood  per rectum, melena, or hematemesis Neurologic:  No changes in mental status. Dizziness since waking up this morning All other systems reviewed and are otherwise negative except as noted above.  Physical Exam  Blood pressure 99/77, pulse 77, temperature 97.6 F (36.4 C), temperature source Oral, resp. rate 24, height 5\' 10"  (1.778 m), weight 230 lb (104.327 kg), SpO2 97 %.  General: Pleasant, NAD Psych: Normal affect. Neuro: Alert and oriented X 3. Moves all extremities spontaneously. HEENT: Normal  Neck: Supple without bruits or JVD. Lungs:  Resp regular and unlabored, CTA. Heart: regularly irregular no s3, s4, or murmurs. Abdomen: Soft, non-tender, non-distended, BS + x 4.  Extremities: No clubbing, cyanosis or edema. DP/PT/Radials 2+ and equal bilaterally. L knee surgical site well healed  Labs  Troponin (Point of Care Test)  Recent Labs  08/12/14 1145  TROPIPOC 0.00   No results for input(s): CKTOTAL, CKMB, TROPONINI in the last 72 hours. Lab Results  Component Value Date   WBC 9.1 08/12/2014    HGB 15.2 08/12/2014   HCT 44.2 08/12/2014   MCV 90.8 08/12/2014   PLT 229 08/12/2014    Recent Labs Lab 08/12/14 1139  NA 138  K 4.9  CL 101  CO2 24  BUN 14  CREATININE 1.09  CALCIUM 9.2  GLUCOSE 95   Lab Results  Component Value Date   CHOL 95 06/12/2014   HDL 32* 06/12/2014   LDLCALC 47 06/12/2014   TRIG 80 06/12/2014   No results found for: DDIMER   Radiology/Studies  No results found.  ECG  Likely atrial flutter with RVR  Echocardiogram 06/11/2014  LV EF: 55% -  60%  ------------------------------------------------------------------- Indications:   Atrial flutter 427.32.  ------------------------------------------------------------------- History:  PMH: No prior cardiac history.  ------------------------------------------------------------------- Study Conclusions  - Left ventricle: The cavity size was normal. Wall thickness was normal. Systolic function was normal. The estimated ejection fraction was in the range of 55% to 60%. Indeterminant diastolic function (atrial flutter). Wall motion was normal; there were no regional wall motion abnormalities. - Aortic valve: There was no stenosis. - Mitral valve: There was no significant regurgitation. - Left atrium: The atrium was mildly dilated. - Right ventricle: The cavity size was normal. Systolic function was normal. - Pulmonary arteries: No complete TR doppler jet so unable to estimate PA systolic pressure. - Systemic veins: The IVC was not visualized.  Impressions:  - The patient was in atrial flutter. Normal LV size and systolic function, EF 09-32%. Normal RV size and systolic function. No significant valvular abnormalities.    ASSESSMENT AND PLAN  1. Paroxysmal atrial flutter  - previously thought to be related to knee procedure as it occurred in postoperative setting  - CHA2DS2-Vasc score 2-4 (age, CAD, stroke)  - last dose of eliquis yesterday morning, transitioned  to ASA starting yesterday.   - HR initially in 110s with hypotension. HR and BP improved with IV hydration.  - will discuss with Dr. Debara Pickett, given the timing of last dose of eliquis, can potentially consider cardioversion tomorrow w/o TEE. Consider restart eliquis and d/c ASA given recurrence of a-flutter  2. Abnormal stress test: unclear if related to diagphragmatic attenuation, given normal EF, lack of symptom, per Dr. [name], no plan for cardiac catheterization  - initial stress test prompted by coronary calcification seen on CT of chest  3. History of DVT 4. Hemorrhagic stroke secondary to brain aneurysm status post clipping 5. AAA 6. PAF   Signed, Almyra Deforest, PA-C 08/12/2014, 12:37  PM

## 2014-08-12 NOTE — ED Notes (Signed)
Pt reports onset of dizziness when he got up this am, denies any cp or sob. Feels like HR is irregular and has hx of same. Pt went to pcp and was told to come here today due to atrial flutter and hypotension.

## 2014-08-12 NOTE — ED Notes (Signed)
pts neighbor who brought pt is leaving, his # is [phone]

## 2014-08-12 NOTE — ED Notes (Signed)
Cardiology at bedside sts patient doesn't need cardioversion today or consent.

## 2014-08-12 NOTE — Telephone Encounter (Signed)
Spoke with Landry Dyke (extender)with Dr Bernette Mayers states patient is in the office.  Patient has rhythm of atrial flutter (looks 3:1),orthostatic,80/60-112/72, dizzy. She wanted to know if patient need to come to office or to ER RN reviewed with Dr Shelva Majestic- last office visit, ekg shown to Dr Claiborne Billings. Per Dr Claiborne Billings, patient should go to [facility] ER- Information given to [facility]. Notified Larena Glassman- cardmaster at [facility]

## 2014-08-13 DIAGNOSIS — K219 Gastro-esophageal reflux disease without esophagitis: Secondary | ICD-10-CM | POA: Diagnosis present

## 2014-08-13 DIAGNOSIS — N400 Enlarged prostate without lower urinary tract symptoms: Secondary | ICD-10-CM | POA: Diagnosis present

## 2014-08-13 DIAGNOSIS — M069 Rheumatoid arthritis, unspecified: Secondary | ICD-10-CM | POA: Diagnosis present

## 2014-08-13 DIAGNOSIS — Z8673 Personal history of transient ischemic attack (TIA), and cerebral infarction without residual deficits: Secondary | ICD-10-CM | POA: Diagnosis not present

## 2014-08-13 DIAGNOSIS — Z96652 Presence of left artificial knee joint: Secondary | ICD-10-CM | POA: Diagnosis present

## 2014-08-13 DIAGNOSIS — I714 Abdominal aortic aneurysm, without rupture: Secondary | ICD-10-CM | POA: Diagnosis present

## 2014-08-13 DIAGNOSIS — R42 Dizziness and giddiness: Secondary | ICD-10-CM | POA: Diagnosis present

## 2014-08-13 DIAGNOSIS — Z79899 Other long term (current) drug therapy: Secondary | ICD-10-CM | POA: Diagnosis not present

## 2014-08-13 DIAGNOSIS — Z85828 Personal history of other malignant neoplasm of skin: Secondary | ICD-10-CM | POA: Diagnosis not present

## 2014-08-13 DIAGNOSIS — I2510 Atherosclerotic heart disease of native coronary artery without angina pectoris: Secondary | ICD-10-CM | POA: Diagnosis present

## 2014-08-13 DIAGNOSIS — Z87891 Personal history of nicotine dependence: Secondary | ICD-10-CM | POA: Diagnosis not present

## 2014-08-13 DIAGNOSIS — Z7901 Long term (current) use of anticoagulants: Secondary | ICD-10-CM | POA: Diagnosis not present

## 2014-08-13 DIAGNOSIS — I480 Paroxysmal atrial fibrillation: Secondary | ICD-10-CM | POA: Diagnosis present

## 2014-08-13 DIAGNOSIS — I4892 Unspecified atrial flutter: Secondary | ICD-10-CM | POA: Diagnosis present

## 2014-08-13 DIAGNOSIS — I959 Hypotension, unspecified: Secondary | ICD-10-CM | POA: Diagnosis present

## 2014-08-13 DIAGNOSIS — Z86718 Personal history of other venous thrombosis and embolism: Secondary | ICD-10-CM | POA: Diagnosis not present

## 2014-08-13 LAB — TROPONIN I
Troponin I: <0.3 ng/mL (ref <0.30)
Troponin I: <0.3 ng/mL (ref <0.30)
Troponin I: <0.3 ng/mL (ref <0.30)

## 2014-08-13 LAB — BASIC METABOLIC PANEL
Anion gap: 13 (ref 5–15)
BUN: 13 mg/dL (ref 6–23)
CALCIUM: 8.8 mg/dL (ref 8.4–10.5)
CO2: 22 mEq/L (ref 19–32)
CREATININE: 1.07 mg/dL (ref 0.50–1.35)
Chloride: 106 mEq/L (ref 96–112)
GFR, EST AFRICAN AMERICAN: 78 mL/min — AB (ref >90)
GFR, EST NON AFRICAN AMERICAN: 67 mL/min — AB (ref >90)
Glucose, Bld: 107 mg/dL — ABNORMAL HIGH (ref 70–99)
Potassium: 4.7 mEq/L (ref 3.7–5.3)
Sodium: 141 mEq/L (ref 137–147)

## 2014-08-13 LAB — TSH: TSH: 2.56 u[IU]/mL (ref 0.350–4.500)

## 2014-08-13 MED ORDER — SODIUM CHLORIDE 0.9 % IJ SOLN: 3.0000 mL | INTRAMUSCULAR | Status: DC | PRN

## 2014-08-13 MED ORDER — SODIUM CHLORIDE 0.9 % IV SOLN: INTRAVENOUS | Status: DC

## 2014-08-13 MED ORDER — SODIUM CHLORIDE 0.9 % IV SOLN: 250.0000 mL | INTRAVENOUS | Status: DC

## 2014-08-13 MED ORDER — SODIUM CHLORIDE 0.9 % IJ SOLN
3.0000 mL | Freq: Two times a day (BID) | INTRAMUSCULAR | Status: DC
  Administered 2014-08-13 (×2): 3 mL via INTRAVENOUS

## 2014-08-13 MED ORDER — HYDROCORTISONE 1 % EX CREA
1.0000 | TOPICAL_CREAM | Freq: Three times a day (TID) | CUTANEOUS | Status: DC | PRN
Start: 2014-08-14 — End: 2014-08-14
  Filled 2014-08-13: qty 28

## 2014-08-13 NOTE — Plan of Care (Signed)
Problem: Consults Goal: General Medical Patient Education See Patient Education Module for specific education.  Outcome: Completed/Met Date Met:  08/13/14 Goal: Skin Care Protocol Initiated - if Braden Score 18 or less If consults are not indicated, leave blank or document N/A  Outcome: Not Applicable Date Met:  47/07/61 Goal: Nutrition Consult-if indicated Outcome: Not Applicable Date Met:  51/83/43 Goal: Diabetes Guidelines if Diabetic/Glucose > 140 If diabetic or lab glucose is > 140 mg/dl - Initiate Diabetes/Hyperglycemia Guidelines & Document Interventions  Outcome: Not Applicable Date Met:  73/57/89  Problem: Phase I Progression Outcomes Goal: Pain controlled with appropriate interventions Outcome: Completed/Met Date Met:  08/13/14 Goal: OOB as tolerated unless otherwise ordered Outcome: Completed/Met Date Met:  08/13/14 Goal: Initial discharge plan identified Outcome: Completed/Met Date Met:  08/13/14 Goal: Voiding-avoid urinary catheter unless indicated Outcome: Completed/Met Date Met:  08/13/14 Goal: Hemodynamically stable Outcome: Completed/Met Date Met:  08/13/14 Goal: Other Phase I Outcomes/Goals Outcome: Completed/Met Date Met:  08/13/14

## 2014-08-13 NOTE — Progress Notes (Signed)
SUBJECTIVE:  Patient reports he feels better this morning.  He no longer has feeling of "earthquake inside my chest".  Denies any chest pain, SOB, dizziness.  OBJECTIVE:   Vitals:   Filed Vitals:   08/12/14 1612 08/12/14 2008 08/13/14 0517 08/13/14 0700  BP: 124/70 105/55 89/52 102/48  Pulse: 97 115 58   Temp: 97.8 F (36.6 C) 97.8 F (36.6 C) 98.2 F (36.8 C)   TempSrc: Oral Oral Oral   Resp: 20 20    Height: 5\' 10"  (1.778 m)     Weight: 228 lb 6.4 oz (103.602 kg)  227 lb 12.8 oz (103.329 kg)   SpO2: 98% 93% 94%    I&O's:   Intake/Output Summary (Last 24 hours) at 08/13/14 0910 Last data filed at 08/12/14 2200  Gross per 24 hour  Intake    243 ml  Output    150 ml  Net     93 ml   TELEMETRY: Reviewed telemetry pt in Atrial Flutter with rate controlled:     PHYSICAL EXAM General: Well developed, well nourished, in no acute distress Head:   Normal cephalic and atramatic  Lungs:  Clear bilaterally to auscultation. Heart:  irregularly irregular S1 S2  No JVD.   Abdomen: abdomen soft and non-tender Msk:  Back normal,  Normal strength and tone for age. Extremities:  Trace edema.   Neuro: Alert and oriented. Psych:  Normal affect, responds appropriately Skin: No rash   LABS: Basic Metabolic Panel:  Recent Labs  08/12/14 1139 08/13/14 0550  NA 138 141  K 4.9 4.7  CL 101 106  CO2 24 22  GLUCOSE 95 107*  BUN 14 13  CREATININE 1.09 1.07  CALCIUM 9.2 8.8   Liver Function Tests: No results for input(s): AST, ALT, ALKPHOS, BILITOT, PROT, ALBUMIN in the last 72 hours. No results for input(s): LIPASE, AMYLASE in the last 72 hours. CBC:  Recent Labs  08/12/14 1139  WBC 9.1  HGB 15.2  HCT 44.2  MCV 90.8  PLT 229   Cardiac Enzymes:  Recent Labs  08/12/14 1139 08/13/14 0020 08/13/14 0550  TROPONINI <0.30 <0.30 <0.30   BNP: Invalid input(s): POCBNP D-Dimer: No results for input(s): DDIMER in the last 72 hours. Hemoglobin A1C: No results for  input(s): HGBA1C in the last 72 hours. Fasting Lipid Panel: No results for input(s): CHOL, HDL, LDLCALC, TRIG, CHOLHDL, LDLDIRECT in the last 72 hours. Thyroid Function Tests:  Recent Labs  08/13/14 0550  TSH 2.560   Anemia Panel: No results for input(s): VITAMINB12, FOLATE, FERRITIN, TIBC, IRON, RETICCTPCT in the last 72 hours. Coag Panel:   Lab Results  Component Value Date   INR 1.19 06/12/2014   INR 1.07 05/07/2014   INR 1.00 08/29/2013    RADIOLOGY: No results found.    ASSESSMENT/ PLAN:     Atrial flutter with rapid ventricular response - rate now controlled on Sotalol 120mg  BID - Continue Eliquis - Schedule for cardioversion tomorrow if does not convert on his own. -TSH wnl, troponins negative    Coronary artery calcification seen on CT scan/  Abnormal nuclear cardiac imaging test - No anginal symptoms.    Abdominal aortic aneurysm -3.5cm in Dec 2014. - Continue to monitor, will need Abdominal U/S for follow up as outpatient as it has almost been a year since last imaging.      Lucious Groves, DO  08/13/2014  9:10 AM   I have examined the patient and reviewed assessment and plan  and discussed with patient.  Agree with above as stated.  Probably Cardioversion tomorrow.  He has been on long term Eliquis prior to this admission. Went over signs and sx of AFib that he could check at his home.  He felt some fluttering in his chest.  It was like an "earthquake" in his chest. He was jittery.  This is likely his artial arrhythmia.  VARANASI,JAYADEEP S.

## 2014-08-14 ENCOUNTER — Encounter (HOSPITAL_COMMUNITY)
Admission: EM | Disposition: A | Discharge status: Home / Self Care | Payer: Self-pay | Source: Home / Self Care | Attending: Internal Medicine

## 2014-08-14 DIAGNOSIS — Z7901 Long term (current) use of anticoagulants: Secondary | ICD-10-CM

## 2014-08-14 LAB — MAGNESIUM: Magnesium: 2.1 mg/dL (ref 1.5–2.5)

## 2014-08-14 LAB — BASIC METABOLIC PANEL
Anion gap: 12 (ref 5–15)
BUN: 12 mg/dL (ref 6–23)
CHLORIDE: 104 meq/L (ref 96–112)
CO2: 23 mEq/L (ref 19–32)
CREATININE: 1.04 mg/dL (ref 0.50–1.35)
Calcium: 8.7 mg/dL (ref 8.4–10.5)
GFR calc Af Amer: 81 mL/min — ABNORMAL LOW (ref >90)
GFR calc non Af Amer: 70 mL/min — ABNORMAL LOW (ref >90)
GLUCOSE: 101 mg/dL — AB (ref 70–99)
Potassium: 4.5 mEq/L (ref 3.7–5.3)
Sodium: 139 mEq/L (ref 137–147)

## 2014-08-14 LAB — CBC WITH DIFFERENTIAL/PLATELET
Basophils Absolute: 0 10*3/uL (ref 0.0–0.1)
Basophils Relative: 0 % (ref 0–1)
EOS ABS: 0.2 10*3/uL (ref 0.0–0.7)
EOS PCT: 3 % (ref 0–5)
HCT: 38.2 % — ABNORMAL LOW (ref 39.0–52.0)
HEMOGLOBIN: 12.9 g/dL — AB (ref 13.0–17.0)
LYMPHS ABS: 1.4 10*3/uL (ref 0.7–4.0)
Lymphocytes Relative: 21 % (ref 12–46)
MCH: 30.3 pg (ref 26.0–34.0)
MCHC: 33.8 g/dL (ref 30.0–36.0)
MCV: 89.7 fL (ref 78.0–100.0)
MONO ABS: 0.4 10*3/uL (ref 0.1–1.0)
MONOS PCT: 6 % (ref 3–12)
Neutro Abs: 4.9 10*3/uL (ref 1.7–7.7)
Neutrophils Relative %: 70 % (ref 43–77)
Platelets: 203 10*3/uL (ref 150–400)
RBC: 4.26 MIL/uL (ref 4.22–5.81)
RDW: 15.1 % (ref 11.5–15.5)
WBC: 7 10*3/uL (ref 4.0–10.5)

## 2014-08-14 SURGERY — CARDIOVERSION
Anesthesia: Monitor Anesthesia Care

## 2014-08-14 MED ORDER — ACETAMINOPHEN 325 MG PO TABS: 650.0000 mg | ORAL_TABLET | ORAL | Status: DC | PRN

## 2014-08-14 MED ORDER — SOTALOL HCL 120 MG PO TABS: 120.0000 mg | ORAL_TABLET | Freq: Two times a day (BID) | ORAL | Status: DC

## 2014-08-14 MED ORDER — SODIUM CHLORIDE 0.9 % IJ SOLN: 3.0000 mL | INTRAMUSCULAR | Status: DC | PRN

## 2014-08-14 MED ORDER — SODIUM CHLORIDE 0.9 % IV SOLN: 250.0000 mL | INTRAVENOUS | Status: DC

## 2014-08-14 MED ORDER — SODIUM CHLORIDE 0.9 % IJ SOLN: 3.0000 mL | Freq: Two times a day (BID) | INTRAMUSCULAR | Status: DC

## 2014-08-14 MED ORDER — APIXABAN 5 MG PO TABS: 5.0000 mg | ORAL_TABLET | Freq: Two times a day (BID) | ORAL | Status: DC

## 2014-08-14 NOTE — Discharge Instructions (Signed)
We re-ordered your eliquis.  New med: BETAPACE, sotalol   Heart healthy diet.

## 2014-08-14 NOTE — Plan of Care (Signed)
Problem: Phase III Progression Outcomes Goal: Pain controlled on oral analgesia Outcome: Completed/Met Date Met:  08/14/14 Goal: Activity at appropriate level-compared to baseline (UP IN CHAIR FOR HEMODIALYSIS)  Outcome: Completed/Met Date Met:  08/14/14 Goal: Voiding independently Outcome: Completed/Met Date Met:  08/14/14

## 2014-08-14 NOTE — Discharge Summary (Signed)
Physician Discharge Summary       Patient ID: Jeffrey Stephens MRN: [id number] DOB/AGE: [birth year] 73 y.o.  Admit date: 08/12/2014 Discharge date: 08/14/2014  Discharge Diagnoses:  Principal Problem:   Atrial flutter with rapid ventricular response, converted with sotalol Active Problems:   Atrial flutter, paroxysmal   Coronary artery calcification seen on CT scan   Anticoagulation adequate   Abdominal aortic aneurysm   Abnormal nuclear cardiac imaging test   Atrial flutter   Discharged Condition: good  Procedures: none   Hospital Course:  73 year old Caucasian male with PMH of DVT, hemorrhagic stroke secondary to brain aneurysm status post clipping, AAA, and PAF. Of note, he underwent L knee surgery in August 2015. Postoperatively, he had paroxysmal atrial flutter. Given his CHA2DS2-Vasc score of 2, he was placed on metoprolol and Eliquis. His last echocardiogram on 06/11/2014 showed EF 55-60%, mildly dilated LA, no significant valvular disease. He recently underwent outpatient nuclear stress test on 07/08/2014 as result of incidental finding of coronary calcification of CT. Myoview showed EF 65%, questionable inferior wall defect that appears to be reversible. Per Dr. [name], given excessive intestinal activity, this could represent diaphragmatic attenuation artifact. He was seen by Dr. [name] on 07/13/2014. Given that the patient does not smoke, no exertional symptom, and no heart failure symptoms, it was felt that cardiac catheterization is not indicated at this time. However, future cardiac catheterization would be indicated if the patient has exertional symptom or if he need to undergo high risk surgical procedure.   According to the patient, he has since recovered well after his left knee surgery in August. He denied significant exertional symptom, lower extremity edema, orthopnea or paroxysmal nocturnal dyspnea. He states he has not been as active as prior to the knee  surgery. During the last office visit, he had a discussion with Dr. [name] regarding the need to continue Eliquis. Patient wished to come off Eliquis at this time. Given the fact that he has not had any recurrent a-flutter, and previous atrial flutter was in the setting of post surgical procedure, he was deemed stable enough to transition to aspirin after discussing risk of bleeding and stroke. He states his last dose of Eliquis was in the morning of 08/11/2014.  Patient woke up in the morning of 08/12/2014 with dizziness. He states the degree of dizziness increased with change from sitting to standing position. He got up and went to the bathroom and the dizziness got worse. Patient called cardiologist office who recommended him to seek medical attention at [facility]. On arrival, patient was hypotensive with systolic blood pressure in the 80s. Heart rate was elevated at 110s. EKG and telemetry both identified atrial flutter. He was given IV fluid hydration with improvement in heart rate which is now down to 80s. Pt was admitted and metoprolol stopped and placed on Betapace.  Plan was to have DCCV on the 20th.  Pt did well and during the night on the 19th he converted to SR. Stable EKG.  He feels much better and wanted to go home.    We was seen and evaluated by Dr. Irish Lack and found stable for discharge.  Will follow up with Dr. Jerilynn Mages. Croitoru's PA.   Consults: None  Significant Diagnostic Studies:  BMET    Component Value Date/Time   NA 139 08/14/2014 0426   K 4.5 08/14/2014 0426   CL 104 08/14/2014 0426   CO2 23 08/14/2014 0426   GLUCOSE 101* 08/14/2014 0426   BUN 12  08/14/2014 0426   CREATININE 1.04 08/14/2014 0426   CALCIUM 8.7 08/14/2014 0426   GFRNONAA 70* 08/14/2014 0426   GFRAA 81* 08/14/2014 0426     CBC    Component Value Date/Time   WBC 7.0 08/14/2014 0426   RBC 4.26 08/14/2014 0426   HGB 12.9* 08/14/2014 0426   HCT 38.2* 08/14/2014 0426   PLT 203 08/14/2014 0426     MCV 89.7 08/14/2014 0426   MCH 30.3 08/14/2014 0426   MCHC 33.8 08/14/2014 0426   RDW 15.1 08/14/2014 0426   LYMPHSABS 1.4 08/14/2014 0426   MONOABS 0.4 08/14/2014 0426   EOSABS 0.2 08/14/2014 0426   BASOSABS 0.0 08/14/2014 0426   Troponin negative X 3 TSH 2.5   EKG at discharge: Vent. rate 66 BPM PR interval 226 ms QRS duration 82 ms QT/QTc 432/452 ms P-R-T axes 64 85 82 Sinus rhythm with 1st degree A-V block Otherwise normal ECG   Discharge Exam: Blood pressure 113/60, pulse 65, temperature 98 F (36.7 C), temperature source Oral, resp. rate 18, height 5\' 10"  (1.778 m), weight 227 lb 1.6 oz (103.012 kg), SpO2 98 %.   Disposition: 01-Home or Self Care     Medication List    STOP taking these medications        metoprolol tartrate 12.5 mg Tabs tablet  Commonly known as:  LOPRESSOR      TAKE these medications        acetaminophen 325 MG tablet  Commonly known as:  TYLENOL  Take 2 tablets (650 mg total) by mouth every 4 (four) hours as needed for headache or mild pain.     apixaban 5 MG Tabs tablet  Commonly known as:  ELIQUIS  Take 1 tablet (5 mg total) by mouth 2 (two) times daily.     CENTRUM SILVER ADULT 50+ Tabs  Take 50 mg by mouth daily.     docusate sodium 100 MG capsule  Commonly known as:  COLACE  Take 100 mg by mouth 2 (two) times daily as needed for mild constipation.     folic acid 1 MG tablet  Commonly known as:  FOLVITE  Take 1 mg by mouth every morning.     lansoprazole 30 MG capsule  Commonly known as:  PREVACID  Take 30 mg by mouth every morning.     methotrexate 2.5 MG tablet  Commonly known as:  RHEUMATREX  Take 12.5 mg by mouth every Monday.     oxyCODONE-acetaminophen 5-325 MG per tablet  Commonly known as:  PERCOCET/ROXICET  Take 0.5-1 tablets by mouth every 4 (four) hours as needed for moderate pain.     sotalol 120 MG tablet  Commonly known as:  BETAPACE  Take 1 tablet (120 mg total) by mouth every 12 (twelve) hours.      tamsulosin 0.4 MG Caps capsule  Commonly known as:  FLOMAX  Take 0.4 mg by mouth daily as needed (for prostate).          Discharge Instructions: We re-ordered your eliquis.  New med: BETAPACE, sotalol   Heart healthy diet.      Signed: [name] Nurse Practitioner-Certified [facility]: [facility] 08/14/2014, 8:29 AM  Time spent on discharge : >30 minutes.    I have examined the patient and reviewed assessment and plan and discussed with patient. Agree with above as stated. In NSR. Needs Rx for sotalol and Eliquis. OK for discharge. F/u with Dr. Alfredia Ferguson.

## 2014-08-14 NOTE — Progress Notes (Signed)
.         Subjective: Wants to go home, converted to SR during the night.   Objective: Vital signs in last 24 hours: Temp:  [97.8 F (36.6 C)-98.1 F (36.7 C)] 98 F (36.7 C) (11/20 0614) Pulse Rate:  [65-73] 65 (11/20 0614) Resp:  [18-19] 18 (11/19 2054) BP: (100-117)/(41-60) 113/60 mmHg (11/20 0614) SpO2:  [98 %] 98 % (11/20 0614) Weight:  [227 lb 1.6 oz (103.012 kg)] 227 lb 1.6 oz (103.012 kg) (11/20 3016) Weight change: -2 lb 14.4 oz (-1.315 kg) Last BM Date: 08/12/14 Intake/Output from previous day: 11/19 0701 - 11/20 0700 In: 363 [P.O.:360; I.V.:3] Out: -  Intake/Output this shift:    PE: General:Pleasant affect, NAD Skin:Warm and dry, brisk capillary refill HEENT:normocephalic, sclera clear, mucus membranes moist Heart:S1S2 RRR without murmur, gallup, rub or click Lungs:clear without rales, rhonchi, or wheezes WFU:XNAT, non tender, + BS, do not palpate liver spleen or masses Ext:no lower ext edema, 2+ pedal pulses, 2+ radial pulses Neuro:alert and oriented X 3, MAE, follows commands, + facial symmetry   Lab Results:  Recent Labs  08/12/14 1139 08/14/14 0426  WBC 9.1 7.0  HGB 15.2 12.9*  HCT 44.2 38.2*  PLT 229 203   BMET  Recent Labs  08/13/14 0550 08/14/14 0426  NA 141 139  K 4.7 4.5  CL 106 104  CO2 22 23  GLUCOSE 107* 101*  BUN 13 12  CREATININE 1.07 1.04  CALCIUM 8.8 8.7    Recent Labs  08/13/14 0550 08/13/14 1215  TROPONINI <0.30 <0.30    Lab Results  Component Value Date   CHOL 95 06/12/2014   HDL 32* 06/12/2014   LDLCALC 47 06/12/2014   TRIG 80 06/12/2014   CHOLHDL 3.0 06/12/2014   Lab Results  Component Value Date   HGBA1C 5.3 06/11/2014     Lab Results  Component Value Date   TSH 2.560 08/13/2014     Studies/Results: No results found.  Medications: I have reviewed the patient's current medications. Scheduled Meds: . apixaban  5 mg Oral BID  . folic acid  1 mg Oral Daily  . [START ON 08/17/2014]  methotrexate  12.5 mg Oral Q Mon  . metoprolol  5 mg Intravenous Once  . multivitamin with minerals  1 tablet Oral Daily  . pantoprazole  40 mg Oral Daily  . sodium chloride  3 mL Intravenous Q12H  . sodium chloride  3 mL Intravenous Q12H  . sotalol  120 mg Oral Q12H   Continuous Infusions: . sodium chloride 250 mL (08/12/14 1429)  . sodium chloride    . sodium chloride     PRN Meds:.acetaminophen, docusate sodium, hydrocortisone cream, ondansetron (ZOFRAN) IV, oxyCODONE-acetaminophen, sodium chloride, sodium chloride, tamsulosin  Assessment/Plan: Atrial flutter with rapid ventricular response - converted to SR during the night on Sotalol 120mg  BID - Continue Eliquis - cardioversion cancelled -TSH wnl, troponins negative   Coronary artery calcification seen on CT scan/ Abnormal nuclear cardiac imaging test - No anginal symptoms.   Abdominal aortic aneurysm -3.5cm in Dec 2014. - Continue to monitor, will need Abdominal U/S for follow up as outpatient as it has almost been a year since last imaging.   LOS: 2 days   Time spent with pt. : 15 minutes. [facility] R  Nurse Practitioner Certified Pager [phone] or after 5pm and on weekends call [phone] 08/14/2014, 7:56 AM   I have examined the patient and reviewed assessment and plan and discussed with patient.  Agree with above as stated.  In NSR.  Needs Rx for sotalol and Eliquis.  OK for discharge. F/u with Dr. Alfredia Ferguson.

## 2014-08-31 ENCOUNTER — Ambulatory Visit (INDEPENDENT_AMBULATORY_CARE_PROVIDER_SITE_OTHER): Payer: Medicare Other | Admitting: Cardiology

## 2014-08-31 ENCOUNTER — Encounter: Payer: Self-pay | Admitting: Cardiology

## 2014-08-31 VITALS — BP 110/70 | HR 58 | Ht 70.0 in | Wt 234.1 lb

## 2014-08-31 DIAGNOSIS — I4892 Unspecified atrial flutter: Secondary | ICD-10-CM

## 2014-08-31 NOTE — Progress Notes (Signed)
08/31/2014 Jeffrey Stephens   1941-06-12  [id number]  Primary Physician Irven Shelling, MD Primary Cardiologist: Dr. [name]  HPI:  Jeffrey Stephens presents to clinic today for post hospital follow-up. He is a 73 year old Caucasian male with PMH of DVT, hemorrhagic stroke secondary to brain aneurysm status post clipping, AAA, and PAF. Of note, he underwent L knee surgery in August 2015. Postoperatively, he had paroxysmal atrial flutter. Given his CHA2DS2-Vasc score of 2, he was placed on metoprolol and Eliquis. His last echocardiogram on 06/11/2014 showed EF 55-60%, mildly dilated LA, no significant valvular disease. He recently underwent outpatient nuclear stress test on 07/08/2014 as result of incidental finding of coronary calcification of CT. Myoview showed EF 65%, questionable inferior wall defect that appears to be reversible. Per Dr. [name], given excessive intestinal activity, this could represent diaphragmatic attenuation artifact. He was seen by Dr. [name] on 07/13/2014. Given that the patient does not smoke, no exertional symptom, and no heart failure symptoms, it was felt that cardiac catheterization is not indicated at this time. However, future cardiac catheterization would be indicated if the patient has exertional symptom or if he need to undergo high risk surgical procedure.   According to the patient, he has since recovered well after his left knee surgery in August. He denied significant exertional symptom, lower extremity edema, orthopnea or paroxysmal nocturnal dyspnea. He states he has not been as active as prior to the knee surgery. During the last office visit, he had a discussion with Dr. [name] regarding the need to continue Eliquis. Patient wished to come off Eliquis at this time. Given the fact that he has not had any recurrent a-flutter, and previous atrial flutter was in the setting of post surgical procedure, he was deemed stable enough to transition to aspirin  after discussing risk of bleeding and stroke.   He recently presented to [facility] on 08/12/2014 with complaints of dizziness. On arrival, patient was hypotensive with systolic blood pressure in the 80s. Heart rate was elevated at 110s. EKG and telemetry both identified atrial flutter. He was given IV fluid hydration with improvement in heart rate. Pt was admitted and metoprolol stopped and placed on Betapace. Of note, cardiac enzymes were negative and TSH was within normal limits. Plan was to have should undergo DCCV on the 20th, however he spontaneously converted to NSR on 11/19. EKGs remained stable. He was discharged home on 08/22/2014. He was discharged on 125 mg of sotalol twice a day and 5 mg of Eliquis twice a day.   Today in clinic, he reports that he has been doing well since discharge. He has remained completely asymptomatic and denies any breakthrough atrial flutter on sotalol. He also denies chest pain and dyspnea. He reports full medication compliance and no  abnormal bleeding or falls while on Eliquis.  EKG today demonstrates sinus bradycardia. Ventricular rates 58 bpm. Blood pressure stable 110/70.  Current Outpatient Prescriptions  Medication Sig Dispense Refill  . acetaminophen (TYLENOL) 325 MG tablet Take 2 tablets (650 mg total) by mouth every 4 (four) hours as needed for headache or mild pain.    Marland Kitchen apixaban (ELIQUIS) 5 MG TABS tablet Take 1 tablet (5 mg total) by mouth 2 (two) times daily. 60 tablet 11  . docusate sodium (COLACE) 100 MG capsule Take 100 mg by mouth 2 (two) times daily as needed for mild constipation.    . folic acid (FOLVITE) 1 MG tablet Take 1 mg by mouth every morning.     Marland Kitchen  lansoprazole (PREVACID) 30 MG capsule Take 30 mg by mouth every morning.     . methotrexate (RHEUMATREX) 2.5 MG tablet Take 12.5 mg by mouth every Monday.  1  . Multiple Vitamins-Minerals (CENTRUM SILVER ADULT 50+) TABS Take 50 mg by mouth daily.    Marland Kitchen oxyCODONE-acetaminophen  (PERCOCET/ROXICET) 5-325 MG per tablet Take 0.5-1 tablets by mouth every 4 (four) hours as needed for moderate pain.     . sotalol (BETAPACE) 120 MG tablet Take 1 tablet (120 mg total) by mouth every 12 (twelve) hours. 60 tablet 6  . tamsulosin (FLOMAX) 0.4 MG CAPS capsule Take 0.4 mg by mouth daily as needed (for prostate).   9   No current facility-administered medications for this visit.    No Known Allergies  History   Social History  . Marital Status: Widowed    Spouse Name: N/A    Number of Children: N/A  . Years of Education: N/A   Occupational History  . Not on file.   Social History Main Topics  . Smoking status: Former Smoker -- 2.50 packs/day for 37 years    Types: Cigarettes    Quit date: 09/25/1992  . Smokeless tobacco: Never Used  . Alcohol Use: No  . Drug Use: No  . Sexual Activity: Not Currently   Other Topics Concern  . Not on file   Social History Narrative     Review of Systems: General: negative for chills, fever, night sweats or weight changes.  Cardiovascular: negative for chest pain, dyspnea on exertion, edema, orthopnea, palpitations, paroxysmal nocturnal dyspnea or shortness of breath Dermatological: negative for rash Respiratory: negative for cough or wheezing Urologic: negative for hematuria Abdominal: negative for nausea, vomiting, diarrhea, bright red blood per rectum, melena, or hematemesis Neurologic: negative for visual changes, syncope, or dizziness All other systems reviewed and are otherwise negative except as noted above.    Blood pressure 110/70, pulse 58, height 5\' 10"  (1.778 m), weight 234 lb 1.6 oz (106.187 kg).  General appearance: alert, cooperative and no distress Neck: no carotid bruit and no JVD Lungs: clear to auscultation bilaterally Heart: regular rate and rhythm, S1, S2 normal, no murmur, click, rub or gallop Extremities: no LEE Pulses: 2+ and symmetric Skin: warm and dry Neurologic: Grossly normal  EKG sinus  bradycardia. Heart rate 58 bpm.  ASSESSMENT AND PLAN:   1. Paroxysmal atrial flutter: EKG today demonstrates sinus bradycardia. Ventricular rate is 58 bpm. He denies any symptoms concerning for breakthrough atrial flutter/fibrillation while on sotalol. Continue sotalol for rhythm control. Continue Eliquis twice a day for stroke prophylaxis (CHA2DS2-Vasc score is 2). He has been instructed to notify our office/seek evaluation if any abnormal bleeding or any falls particularly those that resulted in head trauma.  PLAN  patient is stable from a cardiac standpoint and remains in sinus rhythm. Continue current medical regimen. Follow-up with Dr.Croitoru in 3 months for repeat evaluation.  Moneka Mcquinn, BRITTAINYPA-C 08/31/2014 1:47 PM

## 2014-08-31 NOTE — Patient Instructions (Signed)
Your physician recommends that you schedule a follow-up appointment in: 3 Months with Dr [name]

## 2014-09-23 ENCOUNTER — Other Ambulatory Visit: Payer: Self-pay

## 2014-09-24 ENCOUNTER — Other Ambulatory Visit: Payer: Self-pay

## 2014-11-30 ENCOUNTER — Ambulatory Visit: Payer: Medicare Other | Admitting: Cardiovascular Disease

## 2014-12-09 ENCOUNTER — Ambulatory Visit
Admission: RE | Admit: 2014-12-09 | Discharge: 2014-12-09 | Disposition: A | Discharge status: Home / Self Care | Payer: Medicare Other | Source: Ambulatory Visit | Attending: Internal Medicine | Admitting: Internal Medicine

## 2014-12-09 ENCOUNTER — Other Ambulatory Visit: Payer: Self-pay | Admitting: Internal Medicine

## 2014-12-09 DIAGNOSIS — R0789 Other chest pain: Secondary | ICD-10-CM

## 2014-12-21 ENCOUNTER — Encounter: Payer: Self-pay | Admitting: Cardiovascular Disease

## 2014-12-21 ENCOUNTER — Ambulatory Visit (INDEPENDENT_AMBULATORY_CARE_PROVIDER_SITE_OTHER): Payer: Medicare Other | Admitting: Cardiovascular Disease

## 2014-12-21 VITALS — BP 128/70 | HR 55 | Resp 20 | Ht 69.0 in | Wt 239.3 lb

## 2014-12-21 DIAGNOSIS — I2510 Atherosclerotic heart disease of native coronary artery without angina pectoris: Secondary | ICD-10-CM | POA: Diagnosis not present

## 2014-12-21 DIAGNOSIS — Z7901 Long term (current) use of anticoagulants: Secondary | ICD-10-CM | POA: Diagnosis not present

## 2014-12-21 DIAGNOSIS — I4892 Unspecified atrial flutter: Secondary | ICD-10-CM | POA: Diagnosis not present

## 2014-12-21 DIAGNOSIS — I714 Abdominal aortic aneurysm, without rupture, unspecified: Secondary | ICD-10-CM

## 2014-12-21 NOTE — Progress Notes (Signed)
Patient ID: Jeffrey Stephens, male   DOB: November 13, 1940, 74 y.o.   MRN: [id number]     Cardiology Office Note   Date:  12/22/2014   ID:  Jeffrey, Stephens [birth year], MRN [id number]  PCP:  Irven Shelling, MD  Cardiologist:   Sanda Klein, MD   Chief Complaint  Patient presents with  . Follow-up    3 months:  No complaints of chest pain.  SOB with minimal activity.  Swelling in both ankles right > left due to rheumatoid arthritis.  Occas. positional dizziness.      History of Present Illness: Jeffrey Stephens is a 74 y.o. male who presents for follow-up for atrial flutter on chronic treatment with sotalol and Eliquis.  He asks about stopping some of his medications, especially Eliquis.  He initially presented with paroxysmal atrial flutter in August 2015 in the setting of recent left knee surgery. There was evidence of normal left ventricular systolic function by both echo and nuclear scintigraphy, he did not have significant valvular disease in his left atrium was only mildly dilated. He has calcified coronaries on CT of the chest but has never had angina pectoralis. A nuclear study was interpreted as showing possible inferior wall reversible defect (question combination of diaphragmatic attenuation artifact and excessive intestinal activity). He was hospitalized in November 2015 with atrial flutter with rapid ventricular response and converted on treatment with sotalol. As far as we know he has never had any embolic events.  While on treatment with sotalol that has been no clear evidence of breakthrough events. He denies angina pectoris but does describe shortness of breath and fatigue. He has stiffness and swelling in his ankles she attributes to rheumatoid arthritis. Occasionally he has postural dizziness, never severe.  He has a remote history of deep venous thrombosis of the lower extremities, brain aneurysm complicated by secondary hemorrhagic stroke but treated with successful  clipping, AAA.    Past Medical History  Diagnosis Date  . TIA (transient ischemic attack)     [place]  . DVT (deep venous thrombosis) 2001    Hx of Left leg   . History of palpitations     OCCASIONAL  . GERD (gastroesophageal reflux disease)   . Constipation   . BPH (benign prostatic hyperplasia)   . Frequency of urination   . Kidney stones   . AAA (abdominal aortic aneurysm)     followed by Dr. Deitra Mayo  . Stroke 1994    "when I had ruptured aneurysm in my head"  . Cerebral aneurysm 1994  . Rheumatoid arthritis     "qwhere"  . Basal cell carcinoma     "LLE; some on my head"  . History of echocardiogram     a. 2D ECHO: 06/11/2014: EF 55-60%. Normal wall thickness. Indeterminate DD ( atrial flutter ). No RWMA. Mild LA dilation. Normal RV size and systolic function. No significant valvular abnormalities.  . Abnormal nuclear cardiac imaging test 07/13/2014    Past Surgical History  Procedure Laterality Date  . Cerebral aneurysm repair  1994    Hx of ruptured brain aneurysm Tx by Dr. Ellene Route  . Excisional hemorrhoidectomy  1970's  . Cystoscopy w/ stone manipulation  1980's X 1  . Total knee arthroplasty Left 05/14/2014    Procedure: LEFT TOTAL KNEE ARTHROPLASTY;  Surgeon: Johnn Hai, MD;  Location: [facility];  Service: Orthopedics;  Laterality: Left;  . Joint replacement    . Basal cell carcinoma excision      "  LLE; top of my head"     Current Outpatient Prescriptions  Medication Sig Dispense Refill  . acetaminophen (TYLENOL) 325 MG tablet Take 2 tablets (650 mg total) by mouth every 4 (four) hours as needed for headache or mild pain.    Marland Kitchen apixaban (ELIQUIS) 5 MG TABS tablet Take 1 tablet (5 mg total) by mouth 2 (two) times daily. 60 tablet 11  . docusate sodium (COLACE) 100 MG capsule Take 100 mg by mouth 2 (two) times daily as needed for mild constipation.    . folic acid (FOLVITE) 1 MG tablet Take 1 mg by mouth every morning.     . lansoprazole  (PREVACID) 30 MG capsule Take 30 mg by mouth every morning.     . methotrexate (RHEUMATREX) 2.5 MG tablet Take 12.5 mg by mouth every Monday.  1  . Multiple Vitamins-Minerals (CENTRUM SILVER ADULT 50+) TABS Take 50 mg by mouth daily.    Marland Kitchen oxyCODONE-acetaminophen (PERCOCET/ROXICET) 5-325 MG per tablet Take 0.5-1 tablets by mouth every 4 (four) hours as needed for moderate pain.     . sotalol (BETAPACE) 120 MG tablet Take 1 tablet (120 mg total) by mouth every 12 (twelve) hours. 60 tablet 6  . tamsulosin (FLOMAX) 0.4 MG CAPS capsule Take 0.4 mg by mouth daily as needed (for prostate).   9   No current facility-administered medications for this visit.    Allergies:   Review of patient's allergies indicates no known allergies.    Social History:  The patient  reports that he quit smoking about 22 years ago. His smoking use included Cigarettes. He has a 92.5 pack-year smoking history. He has never used smokeless tobacco. He reports that he does not drink alcohol or use illicit drugs.   Family History:  The patient's family history includes Cancer in his brother; Diabetes in his father; Heart attack in his father; Heart disease in his brother and father.    ROS:  Please see the history of present illness.    Otherwise, review of systems positive for none.   All other systems are reviewed and negative.    PHYSICAL EXAM: VS:  BP 128/70 mmHg  Pulse 55  Resp 20  Ht 5\' 9"  (1.753 m)  Wt 239 lb 4.8 oz (108.546 kg)  BMI 35.32 kg/m2 , BMI Body mass index is 35.32 kg/(m^2). General: Alert, oriented x3, no distress  Head: no evidence of trauma, PERRL, EOMI, no exophtalmos or lid lag, no myxedema, no xanthelasma; normal ears, nose and oropharynx  Neck: normal jugular venous pulsations and no hepatojugular reflux; brisk carotid pulses without delay and no carotid bruits  Chest: clear to auscultation, no signs of consolidation by percussion or palpation, normal fremitus, symmetrical and full  respiratory excursions  Cardiovascular: normal position and quality of the apical impulse, regular rhythm, normal first and second heart sounds, no murmurs, rubs or gallops  Abdomen: no tenderness or distention, no masses by palpation, no abnormal pulsatility or arterial bruits, normal bowel sounds, no hepatosplenomegaly  Extremities:  no clubbing, cyanosis or edema; 2+ radial, ulnar and brachial pulses bilaterally; 2+ right femoral, posterior tibial and dorsalis pedis pulses; 2+ left femoral, posterior tibial and dorsalis pedis pulses; no subclavian or femoral bruits  Neurological: grossly nonfocal Psych: euthymic mood, full affect   EKG:  EKG is ordered today. The ekg ordered today demonstrates mild sinus bradycardia with a single premature atrial contraction, mild first-degree AV block (210 ms) QTC 436 ms   Recent Labs: 08/12/2014: Pro B  Natriuretic peptide (BNP) 1027.0* 08/13/2014: TSH 2.560 08/14/2014: BUN 12; Creatinine 1.04; Hemoglobin 12.9*; Magnesium 2.1; Platelets 203; Potassium 4.5; Sodium 139    Lipid Panel    Component Value Date/Time   CHOL 95 06/12/2014 0050   TRIG 80 06/12/2014 0050   HDL 32* 06/12/2014 0050   CHOLHDL 3.0 06/12/2014 0050   VLDL 16 06/12/2014 0050   LDLCALC 47 06/12/2014 0050      Wt Readings from Last 3 Encounters:  12/21/14 239 lb 4.8 oz (108.546 kg)  08/31/14 234 lb 1.6 oz (106.187 kg)  08/14/14 227 lb 1.6 oz (103.012 kg)      ASSESSMENT AND PLAN:  Mr. Kincer has actually had an excellent response to antiarrhythmic treatment sotalol without any noticeable side effects or proarrhythmia. He has not had any serious bleeding complications on Eliquis. Chadsvasc score is 2.  Last year it appeared that his episode of atrial flutter was a single event triggered by hyperadrenergic postoperative state, but it is clear that he has risk of recurrent arrhythmia. We discussed the relative benefit of stroke reduction versus the smaller risk of  bleeding complications. I recommended that he should remain on anticoagulation. He has mild resting bradycardia but overall seems to be able to perform his activities without restrictions. I would recommend continuing sotalol with periodic follow-up, review of renal function and electrolytes.   Current medicines are reviewed at length with the patient today.  The patient has concerns regarding medicines. These were discussed in detail  The following changes have been made:  no change  Labs/ tests ordered today include:  Orders Placed This Encounter  Procedures  . EKG 12-Lead    Patient Instructions  Dr.Reygan Heagle recommends that you schedule a follow-up appointment in: [place] Months    Signed, Sanda Klein, MD  12/22/2014 5:16 PM    Sanda Klein, MD, [facility] [phone] office [phone] pager

## 2014-12-21 NOTE — Patient Instructions (Signed)
Dr.Croitoru recommends that you schedule a follow-up appointment in: SIX Months

## 2015-03-05 ENCOUNTER — Other Ambulatory Visit: Payer: Self-pay | Admitting: Cardiology

## 2015-06-24 ENCOUNTER — Ambulatory Visit (INDEPENDENT_AMBULATORY_CARE_PROVIDER_SITE_OTHER): Payer: Medicare Other | Admitting: Cardiovascular Disease

## 2015-06-24 ENCOUNTER — Encounter: Payer: Self-pay | Admitting: Cardiovascular Disease

## 2015-06-24 VITALS — BP 120/68 | HR 59 | Resp 16 | Ht 69.0 in | Wt 236.0 lb

## 2015-06-24 DIAGNOSIS — Z7901 Long term (current) use of anticoagulants: Secondary | ICD-10-CM

## 2015-06-24 DIAGNOSIS — I2510 Atherosclerotic heart disease of native coronary artery without angina pectoris: Secondary | ICD-10-CM

## 2015-06-24 DIAGNOSIS — I4892 Unspecified atrial flutter: Secondary | ICD-10-CM | POA: Diagnosis not present

## 2015-06-24 NOTE — Progress Notes (Signed)
Patient ID: Jeffrey Stephens, male   DOB: 1941-04-11, 74 y.o.   MRN: [id number]     Cardiology Office Note   Date:  06/25/2015   ID:  Jeffrey, Stephens 02/12/1941, MRN [id number]  PCP:  Irven Shelling, MD  Cardiologist:   Sanda Klein, MD   Chief Complaint  Patient presents with  . Annual Exam  . Shortness of Breath  . Edema      History of Present Illness: Jeffrey Stephens is a 74 y.o. male who presents for  Follow-up for paroxysmal atrial flutter on chronic treatment with sotalol and apixaban.  He has not had problems with recurrent palpitations and denies dyspnea, chest pain, syncope , focal neurological deficits or any serious bleeding problems. He does have very easy bruising.    as on all previous visits he inquires about the need to continue treatment with anticoagulation and sotalol and wants to stop these medications.  He initially presented with paroxysmal atrial flutter in August 2015 in the setting of recent left knee surgery. There was evidence of normal left ventricular systolic function by both echo and nuclear scintigraphy, he did not have significant valvular disease in his left atrium was only mildly dilated. He has calcified coronaries on CT of the chest but has never had angina pectoralis. A nuclear study was interpreted as showing possible inferior wall reversible defect (question combination of diaphragmatic attenuation artifact and excessive intestinal activity). He was hospitalized in November 2015 with atrial flutter with rapid ventricular response and converted on treatment with sotalol. As far as we know he has never had any embolic events. He has a remote history of deep venous thrombosis of the lower extremities, brain aneurysm complicated by secondary hemorrhagic stroke but treated with successful clipping, AAA. He is scheduled to have repeat imaging of a small abdominal aortic aneurysm at [facility] in November.  Past Medical History  Diagnosis Date  . TIA  (transient ischemic attack)     [place]  . DVT (deep venous thrombosis) 2001    Hx of Left leg   . History of palpitations     OCCASIONAL  . GERD (gastroesophageal reflux disease)   . Constipation   . BPH (benign prostatic hyperplasia)   . Frequency of urination   . Kidney stones   . AAA (abdominal aortic aneurysm)     followed by Dr. Deitra Mayo  . Stroke 1994    "when I had ruptured aneurysm in my head"  . Cerebral aneurysm 1994  . Rheumatoid arthritis     "qwhere"  . Basal cell carcinoma     "LLE; some on my head"  . History of echocardiogram     a. 2D ECHO: 06/11/2014: EF 55-60%. Normal wall thickness. Indeterminate DD ( atrial flutter ). No RWMA. Mild LA dilation. Normal RV size and systolic function. No significant valvular abnormalities.  . Abnormal nuclear cardiac imaging test 07/13/2014    Past Surgical History  Procedure Laterality Date  . Cerebral aneurysm repair  1994    Hx of ruptured brain aneurysm Tx by Dr. Ellene Route  . Excisional hemorrhoidectomy  1970's  . Cystoscopy w/ stone manipulation  1980's X 1  . Total knee arthroplasty Left 05/14/2014    Procedure: LEFT TOTAL KNEE ARTHROPLASTY;  Surgeon: Johnn Hai, MD;  Location: [facility];  Service: Orthopedics;  Laterality: Left;  . Joint replacement    . Basal cell carcinoma excision      "LLE; top of my head"  Current Outpatient Prescriptions  Medication Sig Dispense Refill  . acetaminophen (TYLENOL) 325 MG tablet Take 2 tablets (650 mg total) by mouth every 4 (four) hours as needed for headache or mild pain.    Marland Kitchen apixaban (ELIQUIS) 5 MG TABS tablet Take 1 tablet (5 mg total) by mouth 2 (two) times daily. 60 tablet 11  . docusate sodium (COLACE) 100 MG capsule Take 100 mg by mouth 2 (two) times daily as needed for mild constipation.    . folic acid (FOLVITE) 1 MG tablet Take 1 mg by mouth every morning.     . lansoprazole (PREVACID) 30 MG capsule Take 30 mg by mouth every morning.     .  methotrexate (RHEUMATREX) 2.5 MG tablet Take 12.5 mg by mouth every Monday.  1  . Multiple Vitamins-Minerals (CENTRUM SILVER ADULT 50+) TABS Take 50 mg by mouth daily.    Marland Kitchen oxyCODONE-acetaminophen (PERCOCET/ROXICET) 5-325 MG per tablet Take 0.5-1 tablets by mouth every 4 (four) hours as needed for moderate pain.     . tamsulosin (FLOMAX) 0.4 MG CAPS capsule Take 0.4 mg by mouth daily as needed (for prostate).   9   No current facility-administered medications for this visit.    Allergies:   Review of patient's allergies indicates no known allergies.    Social History:  The patient  reports that he quit smoking about 22 years ago. His smoking use included Cigarettes. He has a 92.5 pack-year smoking history. He has never used smokeless tobacco. He reports that he does not drink alcohol or use illicit drugs.   Family History:  The patient's family history includes Cancer in his brother; Diabetes in his father; Heart attack in his father; Heart disease in his brother and father.    ROS:  Please see the history of present illness.    Otherwise, review of systems positive for none.   All other systems are reviewed and negative.    PHYSICAL EXAM: VS:  BP 120/68 mmHg  Pulse 59  Resp 16  Ht '5\' 9"'$  (1.753 m)  Wt 236 lb (107.049 kg)  BMI 34.84 kg/m2 , BMI Body mass index is 34.84 kg/(m^2).  General: Alert, oriented x3, no distress Head: no evidence of trauma, PERRL, EOMI, no exophtalmos or lid lag, no myxedema, no xanthelasma; normal ears, nose and oropharynx Neck: normal jugular venous pulsations and no hepatojugular reflux; brisk carotid pulses without delay and no carotid bruits Chest: clear to auscultation, no signs of consolidation by percussion or palpation, normal fremitus, symmetrical and full respiratory excursions Cardiovascular: normal position and quality of the apical impulse, regular rhythm, normal first and second heart sounds, no murmurs, rubs or gallops Abdomen: no tenderness  or distention, no masses by palpation, no abnormal pulsatility or arterial bruits, normal bowel sounds, no hepatosplenomegaly Extremities: no clubbing, cyanosis or edema; 2+ radial, ulnar and brachial pulses bilaterally; 2+ right femoral, posterior tibial and dorsalis pedis pulses; 2+ left femoral, posterior tibial and dorsalis pedis pulses; no subclavian or femoral bruits Neurological: grossly nonfocal Psych: euthymic mood, full affect   EKG:  EKG is ordered today. The ekg ordered today demonstrates  Sinus bradycardia, QTC 425 ms   Recent Labs: 08/12/2014: Pro B Natriuretic peptide (BNP) 1027.0* 08/13/2014: TSH 2.560 08/14/2014: BUN 12; Creatinine, Ser 1.04; Hemoglobin 12.9*; Magnesium 2.1; Platelets 203; Potassium 4.5; Sodium 139    Lipid Panel    Component Value Date/Time   CHOL 95 06/12/2014 0050   TRIG 80 06/12/2014 0050   HDL 32* 06/12/2014 0050  CHOLHDL 3.0 06/12/2014 0050   VLDL 16 06/12/2014 0050   LDLCALC 47 06/12/2014 0050      Wt Readings from Last 3 Encounters:  06/24/15 236 lb (107.049 kg)  12/21/14 239 lb 4.8 oz (108.546 kg)  08/31/14 234 lb 1.6 oz (106.187 kg)      ASSESSMENT AND PLAN:  Recurrent paroxysmal atrial flutter. One of Mr. Wavra episodes of arrhythmia occurred during acute illness (postoperative) , but the other appears to have been unprovoked. Therefore the risk of recurrence is pretty high. He has responded remarkably well to sotalol. No events have occurred in about a years time. It is not a reasonable to discontinue sotalol , which he prefers to do area however I have recommended that he continue taking uninterrupted anticoagulation.  He has not had documented atrial fibrillation area if atrial flutter once again returns, I think we should refer him for caval tricuspid isthmus ablation. If this is successful and there is no recurrence of atrial tachyarrhythmia during long-term follow-up, we may then be able to discuss discontinuation of his  anticoagulant.    Current medicines are reviewed at length with the patient today.  The patient does not have concerns regarding medicines.  The following changes have been made:   Stop sotalol  Labs/ tests ordered today include:   Orders Placed This Encounter  Procedures  . EKG 12-Lead    Patient Instructions  Your physician has recommended you make the following change in your medication: STOP SOTALOL  Dr. [name] recommends that you schedule a follow-up appointment in: ONE YEAR         SignedSanda Klein, MD  06/25/2015 2:15 PM    Sanda Klein, MD, [facility] [phone] office [phone] pager

## 2015-06-24 NOTE — Patient Instructions (Signed)
Your physician has recommended you make the following change in your medication: STOP SOTALOL  Dr. [name] recommends that you schedule a follow-up appointment in: [place]

## 2015-07-27 ENCOUNTER — Telehealth: Payer: Self-pay | Admitting: *Deleted

## 2015-07-27 NOTE — Telephone Encounter (Signed)
Requesting surgical clearance:   1. Type of surgery: Right upper and lower lid lesion excision and repair w/probing of lacrimal system, ectropion repair with punctoplasty   2. Surgeon: Isidoro Donning  3. Surgical date: 08/16/2015  4. Medications that need to be held: Eliquis

## 2015-07-28 ENCOUNTER — Encounter: Payer: Self-pay | Admitting: Cardiovascular Disease

## 2015-07-28 NOTE — Telephone Encounter (Signed)
Letter sent via epic

## 2015-08-04 ENCOUNTER — Telehealth: Payer: Self-pay | Admitting: Pharmacist Clinician (PhC)/ Clinical Pharmacy Specialist

## 2015-08-04 NOTE — Telephone Encounter (Signed)
Pt came into office asking about how long to hold Eliquis for eye surgery.  Advised that we would have to call patient back with information.   Reviewed chart, Dr. Loletha Grayer has cleared him for surgery and to hold Eliquis x 2 days.  Advised he restart morning after procedure.  Patient voiced understanding.

## 2015-08-18 ENCOUNTER — Other Ambulatory Visit (HOSPITAL_COMMUNITY): Payer: Medicare Other

## 2015-08-18 ENCOUNTER — Ambulatory Visit: Payer: 59 | Admitting: Vascular Surgery

## 2015-08-23 ENCOUNTER — Encounter: Payer: Self-pay | Admitting: Vascular Surgery

## 2015-08-25 ENCOUNTER — Ambulatory Visit (HOSPITAL_COMMUNITY)
Admission: RE | Admit: 2015-08-25 | Discharge: 2015-08-25 | Disposition: A | Discharge status: Home / Self Care | Payer: Medicare Other | Source: Ambulatory Visit | Attending: Vascular Surgery | Admitting: Vascular Surgery

## 2015-08-25 ENCOUNTER — Other Ambulatory Visit: Payer: Self-pay | Admitting: Vascular Surgery

## 2015-08-25 ENCOUNTER — Encounter: Payer: Self-pay | Admitting: Vascular Surgery

## 2015-08-25 ENCOUNTER — Ambulatory Visit (INDEPENDENT_AMBULATORY_CARE_PROVIDER_SITE_OTHER): Payer: Medicare Other | Admitting: Vascular Surgery

## 2015-08-25 VITALS — BP 131/85 | HR 84 | Ht 69.0 in | Wt 238.0 lb

## 2015-08-25 DIAGNOSIS — I714 Abdominal aortic aneurysm, without rupture, unspecified: Secondary | ICD-10-CM

## 2015-08-25 NOTE — Progress Notes (Signed)
[facility] [place]  Patient name: Jeffrey Stephens MRN: [id number] DOB: [birth year] Sex: male  REASON FOR VISIT: follow up of abdominal aortic aneurysm  HPI: Jeffrey Stephens is a 74 y.o. male who I last saw on 02/11/2014. At that time he was asymptomatic. The aneurysm was 3.3 cm on CT scan. I recommend follow up study in 18 months and he returns for that follow up study. Since I saw him last he denies any history of abdominal pain or back pain. He remains fairly active. He is not a smoker. He is on Eliquis.  Past Medical History  Diagnosis Date  . TIA (transient ischemic attack)     [place]  . DVT (deep venous thrombosis) ([place]) 2001    Hx of Left leg   . History of palpitations     OCCASIONAL  . GERD (gastroesophageal reflux disease)   . Constipation   . BPH (benign prostatic hyperplasia)   . Frequency of urination   . Kidney stones   . AAA (abdominal aortic aneurysm) ([place])     followed by Dr. Deitra Mayo  . Stroke [facility]) 1994    "when I had ruptured aneurysm in my head"  . Cerebral aneurysm 1994  . Rheumatoid arthritis ([place])     "qwhere"  . Basal cell carcinoma     "LLE; some on my head"  . History of echocardiogram     a. 2D ECHO: 06/11/2014: EF 55-60%. Normal wall thickness. Indeterminate DD ( atrial flutter ). No RWMA. Mild LA dilation. Normal RV size and systolic function. No significant valvular abnormalities.  . Abnormal nuclear cardiac imaging test 07/13/2014    Family History  Problem Relation Age of Onset  . Heart disease Father     Aneurysm  . Diabetes Father   . Heart attack Father   . Heart disease Brother     Heart Disease before age 10  . Cancer Brother     SOCIAL HISTORY: Social History  Substance Use Topics  . Smoking status: Former Smoker -- 2.50 packs/day for 37 years    Types: Cigarettes    Quit date: 09/25/1992  . Smokeless tobacco: Never Used  . Alcohol Use: No    No Known Allergies  Current  Outpatient Prescriptions  Medication Sig Dispense Refill  . acetaminophen (TYLENOL) 325 MG tablet Take 2 tablets (650 mg total) by mouth every 4 (four) hours as needed for headache or mild pain.    Marland Kitchen apixaban (ELIQUIS) 5 MG TABS tablet Take 1 tablet (5 mg total) by mouth 2 (two) times daily. 60 tablet 11  . docusate sodium (COLACE) 100 MG capsule Take 100 mg by mouth 2 (two) times daily as needed for mild constipation.    . folic acid (FOLVITE) 1 MG tablet Take 1 mg by mouth every morning.     . lansoprazole (PREVACID) 30 MG capsule Take 30 mg by mouth every morning.     . methotrexate (RHEUMATREX) 2.5 MG tablet Take 12.5 mg by mouth every Monday.  1  . Multiple Vitamins-Minerals (CENTRUM SILVER ADULT 50+) TABS Take 50 mg by mouth daily.    Marland Kitchen oxyCODONE-acetaminophen (PERCOCET/ROXICET) 5-325 MG per tablet Take 0.5-1 tablets by mouth every 4 (four) hours as needed for moderate pain.     . tamsulosin (FLOMAX) 0.4 MG CAPS capsule Take 0.4 mg by mouth daily as needed (for prostate).   9   No current facility-administered medications for this visit.    REVIEW  OF SYSTEMS:  '[X]'$  denotes positive finding, '[ ]'$  denotes negative finding Cardiac  Comments:  Chest pain or chest pressure:    Shortness of breath upon exertion:    Short of breath when lying flat:    Irregular heart rhythm:        Vascular    Pain in calf, thigh, or hip brought on by ambulation:    Pain in feet at night that wakes you up from your sleep:     Blood clot in your veins:    Leg swelling:         Pulmonary    Oxygen at home:    Productive cough:     Wheezing:         Neurologic    Sudden weakness in arms or legs:     Sudden numbness in arms or legs:     Sudden onset of difficulty speaking or slurred speech:    Temporary loss of vision in one eye:     Problems with dizziness:         Gastrointestinal    Blood in stool:     Vomited blood:         Genitourinary    Burning when urinating:     Blood in urine:         Psychiatric    Major depression:         Hematologic    Bleeding problems:    Problems with blood clotting too easily:        Skin    Rashes or ulcers:        Constitutional    Fever or chills:      PHYSICAL EXAM: Filed Vitals:   08/25/15 0836  BP: 131/85  Pulse: 84  Height: '5\' 9"'$  (1.753 m)  Weight: 238 lb (107.956 kg)  SpO2: 91%    GENERAL: The patient is a well-nourished male, in no acute distress. The vital signs are documented above. CARDIAC: There is a regular rate and rhythm.  VASCULAR: I do not detect carotid bruits. He has palpable femoral, popliteal, and dorsalis pedis pulses bilaterally. He has mild bilateral lower extremity swelling. PULMONARY: There is good air exchange bilaterally without wheezing or rales. ABDOMEN: Soft and non-tender with normal pitched bowel sounds. Because of his body habitus, I cannot palpate his aneurysm. MUSCULOSKELETAL: There are no major deformities or cyanosis. NEUROLOGIC: No focal weakness or paresthesias are detected. SKIN: There are no ulcers or rashes noted. PSYCHIATRIC: The patient has a normal affect.  DATA:  I have independently interpreted his duplex of his abdominal aorta today. This shows that the maximum diameter of his aneurysm is 3.6 cm. This has increased slightly from 3.3 cm 18 months ago. He does not have aneurysms of his iliac arteries.  MEDICAL ISSUES:  ABDOMINAL AORTIC ANEURYSM: He has a small 3.6 cm infrarenal abdominal aortic aneurysm. We can continue his follow up at 18 months. I will see him back at that time. I have ordered a duplex that time. I've encouraged him to stay active. Fortunately he is not a smoker. His blood pressure is under good control.   Deitra Mayo [facility] [place]: [phone]

## 2015-08-30 ENCOUNTER — Other Ambulatory Visit: Payer: Self-pay | Admitting: Ophthalmology

## 2015-09-01 ENCOUNTER — Other Ambulatory Visit: Payer: Self-pay | Admitting: *Deleted

## 2015-09-01 MED ORDER — APIXABAN 5 MG PO TABS: 5.0000 mg | ORAL_TABLET | Freq: Two times a day (BID) | ORAL | Status: DC

## 2015-09-07 ENCOUNTER — Telehealth: Payer: Self-pay | Admitting: Cardiovascular Disease

## 2015-09-07 NOTE — Telephone Encounter (Signed)
Could we please just bring in for ECG/nurse visit?

## 2015-09-07 NOTE — Telephone Encounter (Signed)
Received call from patient.He stated he started feeling bad last night,dizzy.Stated felt like heart beating fast.Stated today he is still dizzy B/P 99/61.Heart not beating fast,but feels like heart out of rhythm.Stated at last visit with Dr.Croitoru he was doing good and sotalol was stopped.No extender appointments available this week.Appointment scheduled with Rosaria Ferries PA 09/13/15 at 8:30 am.Advised to go to ER if has fast heart beat again.Message sent to Dr.Croitoru for review.

## 2015-09-07 NOTE — Telephone Encounter (Signed)
Returned call to patient.Dr.Croitoru advised nurse visit for EKG.Appointment scheduled for EKG 09/08/15 at 11;30 am.

## 2015-09-08 ENCOUNTER — Ambulatory Visit (INDEPENDENT_AMBULATORY_CARE_PROVIDER_SITE_OTHER): Payer: Medicare Other | Admitting: Cardiology

## 2015-09-08 VITALS — BP 110/72 | HR 91 | Resp 18

## 2015-09-08 DIAGNOSIS — I480 Paroxysmal atrial fibrillation: Secondary | ICD-10-CM

## 2015-09-08 MED ORDER — SOTALOL HCL 120 MG PO TABS: 120.0000 mg | ORAL_TABLET | Freq: Two times a day (BID) | ORAL | Status: DC

## 2015-09-08 NOTE — Progress Notes (Signed)
1.) Reason for visit: EKG  2.) Name of MD requesting visit: Dr. [name]  3.) H&P: Patient has a history of paroxysmal atrial flutter. Last office visit patient had not had any A. Fib or Flutter in a year. Sotalol was discontinued at that time. Patient is still on Eliquis at this time.  4.) ROS related to problem: Patient called office yesterday stating he was back in A. Fib, low BP, and not feeling well. Today patient states he is feeling better. BP 110/72 and HR 91. EKG showed A. Flutter with variable AV conduction, per Dr. Martinique.  5.) Assessment and plan per MD: Consulted Dr. Martinique (DOD). New order to restart Sotalol at last dose 120 mg by mouth every 12 hours and follow-up with already scheduled appointment with Rosaria Ferries PA at the beginning of next week. Patient will need an EKG at that appointment.

## 2015-09-08 NOTE — Patient Instructions (Addendum)
Medication Instructions:  Your physician has recommended you make the following change in your medication:  1- START Sotalol 120 mg by mouth every 12 hours   Labwork: NONE  Testing/Procedures: NONE  Follow-Up: Your physician recommends that you keep your follow-up appointment next week.   Any Other Special Instructions Will Be Listed Below (If Applicable).     If you need a refill on your cardiac medications before your next appointment, please call your pharmacy.

## 2015-09-13 ENCOUNTER — Ambulatory Visit (INDEPENDENT_AMBULATORY_CARE_PROVIDER_SITE_OTHER): Payer: Medicare Other | Admitting: Physician Assistant

## 2015-09-13 ENCOUNTER — Encounter: Payer: Self-pay | Admitting: Physician Assistant

## 2015-09-13 VITALS — BP 130/70 | HR 54 | Ht 69.0 in | Wt 243.0 lb

## 2015-09-13 DIAGNOSIS — Z7901 Long term (current) use of anticoagulants: Secondary | ICD-10-CM | POA: Diagnosis not present

## 2015-09-13 DIAGNOSIS — I4892 Unspecified atrial flutter: Secondary | ICD-10-CM | POA: Diagnosis not present

## 2015-09-13 NOTE — Progress Notes (Addendum)
Cardiology Office Note   Date:  09/13/2015   ID:  Jeffrey Stephens 03/11/1941, MRN [id number]  PCP:  Irven Shelling, MD  Cardiologist:  Dr Juanetta Snow, PA-C   Chief complaint: Atrial arrhythmia  History of Present Illness: Jeffrey Stephens is a 74 y.o. male with a history of PAFlutter on Eliquis, recently restarted on Sotalol 12/14 after recurrence of atrial arrhythmia.   Jeffrey Stephens presents for evaluation.   Jeffrey Stephens states that he had held his Eliquis 12-3 through 12-5 for dental work. When he came in for the dental work on 12-5, they told him he was in atrial fibrillation.   One evening prior to the dental procedure, he remembers feeling that his heart was pounding out of his chest and it was beating very erratically. He had been feeling increased dyspnea on exertion and was tired a lot. He also notes occasional feelings of being lightheaded.   He thought the feeling was secondary to the Uroxatral which had been started for prostate problems so he held this medication.  He took his blood pressure a few days ago and it was 99 systolic. A recheck of his blood pressure today or so later showed the same thing. His heart rate has been normal. He is concerned because he feels bad and wants to know what we can do.   Past Medical History  Diagnosis Date  . TIA (transient ischemic attack)     [place]  . DVT (deep venous thrombosis) ([place]) 2001    Hx of Left leg   . History of palpitations     OCCASIONAL  . GERD (gastroesophageal reflux disease)   . Constipation   . BPH (benign prostatic hyperplasia)   . Frequency of urination   . Kidney stones   . AAA (abdominal aortic aneurysm) ([place])     followed by Dr. Deitra Mayo  . Stroke [facility]) 1994    "when I had ruptured aneurysm in my head"  . Cerebral aneurysm 1994  . Rheumatoid arthritis ([place])     "qwhere"  . Basal cell carcinoma     "LLE; some on my head"  . History of  echocardiogram     a. 2D ECHO: 06/11/2014: EF 55-60%. Normal wall thickness. Indeterminate DD ( atrial flutter ). No RWMA. Mild LA dilation. Normal RV size and systolic function. No significant valvular abnormalities.  . Abnormal nuclear cardiac imaging test 07/13/2014    Past Surgical History  Procedure Laterality Date  . Cerebral aneurysm repair  1994    Hx of ruptured brain aneurysm Tx by Dr. Ellene Route  . Excisional hemorrhoidectomy  1970's  . Cystoscopy w/ stone manipulation  1980's X 1  . Total knee arthroplasty Left 05/14/2014    Procedure: LEFT TOTAL KNEE ARTHROPLASTY;  Surgeon: Jeffrey Hai, MD;  Location: [facility];  Service: Orthopedics;  Laterality: Left;  . Joint replacement    . Basal cell carcinoma excision      "LLE; top of my head"    Current Outpatient Prescriptions  Medication Sig Dispense Refill  . acetaminophen (TYLENOL) 325 MG tablet Take 2 tablets (650 mg total) by mouth every 4 (four) hours as needed for headache or mild pain.    Marland Kitchen alfuzosin (UROXATRAL) 10 MG 24 hr tablet Take 10 mg by mouth daily.    Marland Kitchen apixaban (ELIQUIS) 5 MG TABS tablet Take 1 tablet (5 mg total) by mouth 2 (two) times daily. 60 tablet 11  . BOOSTRIX  5-2.5-18.5 injection     . docusate sodium (COLACE) 100 MG capsule Take 100 mg by mouth 2 (two) times daily as needed for mild constipation.    Marland Kitchen FLUZONE HIGH-DOSE 0.5 ML SUSY Inject 0.5 mLs as directed once. Reported on 16/06/9603  0  . folic acid (FOLVITE) 1 MG tablet Take 1 mg by mouth every morning.     . lansoprazole (PREVACID) 30 MG capsule Take 30 mg by mouth every morning.     . methotrexate (RHEUMATREX) 2.5 MG tablet Take 12.5 mg by mouth every Monday.  1  . Multiple Vitamins-Minerals (CENTRUM SILVER ADULT 50+) TABS Take 50 mg by mouth daily.    Marland Kitchen oxyCODONE-acetaminophen (PERCOCET/ROXICET) 5-325 MG per tablet Take 0.5-1 tablets by mouth every 4 (four) hours as needed for moderate pain. Reported on 09/08/2015    . sotalol (BETAPACE) 120 MG  tablet Take 1 tablet (120 mg total) by mouth 2 (two) times daily. 60 tablet 11   No current facility-administered medications for this visit.    Allergies:   Review of patient's allergies indicates no known allergies.    Social History:  The patient  reports that he quit smoking about 22 years ago. His smoking use included Cigarettes. He has a 92.5 pack-year smoking history. He has never used smokeless tobacco. He reports that he does not drink alcohol or use illicit drugs.   Family History:  The patient's family history includes Cancer in his brother; Diabetes in his father; Heart attack in his father; Heart disease in his brother and father. There is no history of Hypertension or Stroke.    ROS:  Please see the history of present illness. All other systems are reviewed and negative.    PHYSICAL EXAM: VS:  BP 130/70 mmHg  Pulse 54  Ht '5\' 9"'$  (1.753 m)  Wt 243 lb (110.224 kg)  BMI 35.87 kg/m2 , BMI Body mass index is 35.87 kg/(m^2). GEN: Well nourished, well developed, male in no acute distress HEENT: normal for age  Neck: no JVD, no carotid bruit, no masses Cardiac: Irregularly irregular; no murmur, no rubs, or gallops Respiratory:  clear to auscultation bilaterally, normal work of breathing GI: soft, nontender, nondistended, + BS MS: no deformity or atrophy; no edema; distal pulses are 2+ in all 4 extremities  Skin: warm and dry, no rash Neuro:  Strength and sensation are intact Psych: euthymic mood, full affect   EKG:  EKG is ordered today. The ekg ordered today demonstrates atrial flutter, controlled ventricular response   Recent Labs: No results found for requested labs within last 365 days.    Lipid Panel    Component Value Date/Time   CHOL 95 06/12/2014 0050   TRIG 80 06/12/2014 0050   HDL 32* 06/12/2014 0050   CHOLHDL 3.0 06/12/2014 0050   VLDL 16 06/12/2014 0050   LDLCALC 47 06/12/2014 0050     Wt Readings from Last 3 Encounters:  09/13/15 243 lb (110.224  kg)  08/25/15 238 lb (107.956 kg)  06/24/15 236 lb (107.049 kg)     Other studies Reviewed: Additional studies/ records that were reviewed today include: Office Notes and previous ECGs.  ASSESSMENT AND PLAN:  1.  Paroxysmal atrial flutter: He he was started back on his sotalol at 120 mg twice a day and has been compliant with that since 12/14. He does not tolerate the atrial flutter well and we need to pursue sinus rhythm.  However, he held his Eliquis for several days because of dental work,  restarting it on 12/5.  Discussed options with the patient and his wife. Since I feel that we need to pursue sinus rhythm, and I do not feel he is unstable to the point that we need to admit him, he is to continue the Eliquis and the sotalol for now. He is to let us know if his symptoms get any worse. We will see him early in January and by that time he will have had 4 weeks or more of continuous anticoagulation. If he is still in atrial flutter or atrial fibrillation, cardioversion can be scheduled.  2. Chronic anticoagulation: He is generally compliant with his Eliquis and promises not to miss any additional doses. This patients CHA2DS2-VASc Score and unadjusted Ischemic Stroke Rate (% per year) is equal to 3.2 % stroke rate/year from a score of 3 Above score calculated as 1 point each if present [CHF, HTN, DM, Vascular=MI/PAD/Aortic Plaque, Age if 65-74, or Male], 2 points each if present [Age > 75, or Stroke/TIA/TE]  Current medicines are reviewed at length with the patient today.  The patient does not have concerns regarding medicines.  The following changes have been made:  Continue to hold Uroxatral as you are doing  Labs/ tests ordered today include:   Orders Placed This Encounter  Procedures  . EKG 12-Lead     Disposition:   FU with me on Monday Dr. [name] is here in early January in order to schedule cardioversion if needed  Signed, Lenoard Aden  09/13/2015 9:03 AM      [address], [place], [place]  [address] Phone: [phone]; Fax: [phone]

## 2015-09-13 NOTE — Patient Instructions (Addendum)
Your physician recommends that you schedule a follow-up appointment in: January WITH RHONDA BARRETT PA ON A DAY DR CROITORU IS IN THE OFFICE

## 2015-09-29 ENCOUNTER — Encounter: Payer: Self-pay | Admitting: Physician Assistant

## 2015-09-29 ENCOUNTER — Ambulatory Visit (INDEPENDENT_AMBULATORY_CARE_PROVIDER_SITE_OTHER): Payer: Medicare Other | Admitting: Physician Assistant

## 2015-09-29 VITALS — BP 126/70 | HR 68 | Ht 69.0 in | Wt 237.0 lb

## 2015-09-29 DIAGNOSIS — Z7901 Long term (current) use of anticoagulants: Secondary | ICD-10-CM | POA: Diagnosis not present

## 2015-09-29 DIAGNOSIS — I4892 Unspecified atrial flutter: Secondary | ICD-10-CM

## 2015-09-29 NOTE — Patient Instructions (Signed)
Medication Instructions:  Your physician recommends that you continue on your current medications as directed. Please refer to the Current Medication list given to you today.   Labwork: None ordered  Testing/Procedures: None ordered  Follow-Up: Your physician recommends that you schedule a follow-up appointment in: 3 months with Dr.Croitoru   Any Other Special Instructions Will Be Listed Below (If Applicable).     If you need a refill on your cardiac medications before your next appointment, please call your pharmacy.

## 2015-09-29 NOTE — Progress Notes (Signed)
Cardiology Office Note   Date:  09/29/2015   ID:  Jeffrey Stephens, DOB 13-Jun-1941, MRN [id number]  PCP:  Irven Shelling, MD  Cardiologist:  Dr Juanetta Snow, PA-C   Chief Complaint  Patient presents with  . Appointment    shob is better, has had fatigue.     History of Present Illness: Jeffrey Stephens is a 75 y.o. male with a history of PAFlutter on Eliquis, restarted on Sotalol 12/14 after recurrence of atrial arrhythmia, TIA, DVT, GERD, AAA, BPH, nl EF by echo.  He was seen in the office 12/19 and the plan was to schedule him for cardioversion. However, he had missed some doses of his anticoagulant and that had recently been restarted. The plan was then to allow him to be consistently on his anticoagulant for 30 days and then schedule cardioversion. He is here today for evaluation for this.  Jeffrey Stephens presents for evaluation for possible cardioversion.  Jeffrey Stephens has begun to feel better. He has lost several pounds and is watching his sodium more carefully. His palpitations have resolved and he has no awareness of rapid or irregular heart rate. He is not lightheaded or dizzy. He has more energy. He is not having lower extremity edema. He is not felt his heart pounding. His general malaise has resolved.   Past Medical History  Diagnosis Date  . TIA (transient ischemic attack)     [place]  . DVT (deep venous thrombosis) ([place]) 2001    Hx of Left leg   . History of palpitations     OCCASIONAL  . GERD (gastroesophageal reflux disease)   . Constipation   . BPH (benign prostatic hyperplasia)   . Frequency of urination   . Kidney stones   . AAA (abdominal aortic aneurysm) ([place])     followed by Dr. Deitra Mayo  . Stroke [facility]) 1994    "when I had ruptured aneurysm in my head"  . Cerebral aneurysm 1994  . Rheumatoid arthritis ([place])     "qwhere"  . Basal cell carcinoma     "LLE; some on my head"  . History of echocardiogram     a.  2D ECHO: 06/11/2014: EF 55-60%. Normal wall thickness. Indeterminate DD ( atrial flutter ). No RWMA. Mild LA dilation. Normal RV size and systolic function. No significant valvular abnormalities.  . Abnormal nuclear cardiac imaging test 07/13/2014    possible reversible inferior wall defect, felt artifact    Past Surgical History  Procedure Laterality Date  . Cerebral aneurysm repair  1994    Hx of ruptured brain aneurysm Tx by Dr. Ellene Route  . Excisional hemorrhoidectomy  1970's  . Cystoscopy w/ stone manipulation  1980's X 1  . Total knee arthroplasty Left 05/14/2014    Procedure: LEFT TOTAL KNEE ARTHROPLASTY;  Surgeon: Johnn Hai, MD;  Location: [facility];  Service: Orthopedics;  Laterality: Left;  . Joint replacement    . Basal cell carcinoma excision      "LLE; top of my head"    Current Outpatient Prescriptions  Medication Sig Dispense Refill  . acetaminophen (TYLENOL) 325 MG tablet Take 2 tablets (650 mg total) by mouth every 4 (four) hours as needed for headache or mild pain.    Marland Kitchen alfuzosin (UROXATRAL) 10 MG 24 hr tablet Take 10 mg by mouth daily.    Marland Kitchen apixaban (ELIQUIS) 5 MG TABS tablet Take 1 tablet (5 mg total) by mouth 2 (two) times daily.  60 tablet 11  . BOOSTRIX 5-2.5-18.5 injection     . docusate sodium (COLACE) 100 MG capsule Take 100 mg by mouth 2 (two) times daily as needed for mild constipation.    Marland Kitchen FLUZONE HIGH-DOSE 0.5 ML SUSY Inject 0.5 mLs as directed once. Reported on 37/06/6268  0  . folic acid (FOLVITE) 1 MG tablet Take 1 mg by mouth every morning.     . lansoprazole (PREVACID) 30 MG capsule Take 30 mg by mouth every morning.     . methotrexate (RHEUMATREX) 2.5 MG tablet Take 12.5 mg by mouth every Monday.  1  . Multiple Vitamins-Minerals (CENTRUM SILVER ADULT 50+) TABS Take 50 mg by mouth daily.    Marland Kitchen oxyCODONE-acetaminophen (PERCOCET/ROXICET) 5-325 MG per tablet Take 0.5-1 tablets by mouth every 4 (four) hours as needed for moderate pain. Reported on 09/08/2015     . sotalol (BETAPACE) 120 MG tablet Take 1 tablet (120 mg total) by mouth 2 (two) times daily. 60 tablet 11   No current facility-administered medications for this visit.    Allergies:   Review of patient's allergies indicates no known allergies.    Social History:  The patient  reports that he quit smoking about 23 years ago. His smoking use included Cigarettes. He has a 92.5 pack-year smoking history. He has never used smokeless tobacco. He reports that he does not drink alcohol or use illicit drugs.   Family History:  The patient's family history includes Cancer in his brother; Diabetes in his father; Heart attack in his father; Heart disease in his brother and father. There is no history of Hypertension or Stroke.    ROS:  Please see the history of present illness. All other systems are reviewed and negative.    PHYSICAL EXAM: VS:  BP 126/70 mmHg  Pulse 68  Ht '5\' 9"'$  (1.753 m)  Wt 237 lb (107.502 kg)  BMI 34.98 kg/m2 , BMI Body mass index is 34.98 kg/(m^2). GEN: Well nourished, well developed, male in no acute distress HEENT: normal for age  Neck: no JVD, no carotid bruit, no masses Cardiac: RRR; no murmur, no rubs, or gallops Respiratory:  clear to auscultation bilaterally, normal work of breathing GI: soft, nontender, nondistended, + BS MS: no deformity or atrophy; no edema; distal pulses are 2+ in all 4 extremities  Skin: warm and dry, no rash Neuro:  Strength and sensation are intact Psych: euthymic mood, full affect   EKG:  EKG is ordered today. The ekg ordered today demonstrates sinus rhythm, first-degree AV block first-degree AV block, no acute changes   Recent Labs: No results found for requested labs within last 365 days.    Lipid Panel    Component Value Date/Time   CHOL 95 06/12/2014 0050   TRIG 80 06/12/2014 0050   HDL 32* 06/12/2014 0050   CHOLHDL 3.0 06/12/2014 0050   VLDL 16 06/12/2014 0050   LDLCALC 47 06/12/2014 0050     Wt Readings from  Last 3 Encounters:  09/29/15 237 lb (107.502 kg)  09/13/15 243 lb (110.224 kg)  08/25/15 238 lb (107.956 kg)     Other studies Reviewed: Additional studies/ records that were reviewed today include: Previous office notes and ECGs.  ASSESSMENT AND PLAN:  1.  Atrial fibrillation: He has spontaneously converted to sinus rhythm. He is anticoagulated. He feels much better on the Betapace and will not be stopping at any time soon.  His volume status is good, he feels generally well and his heart rate  and blood pressure are stable. No med changes and no further treatment is indicated.  2. Chronic anticoagulation, Xarelto: This patients CHA2DS2-VASc Score and unadjusted Ischemic Stroke Rate (% per year) is equal to 3.2 % stroke rate/year from a score of 3 Above score calculated as 1 point each if present [CHF, HTN, DM, Vascular=MI/PAD/Aortic Plaque, Age if 65-74, or Male], 2 points each if present [Age > 75, or Stroke/TIA/TE]  He is compliant with his Xarelto and will continue this.  Current medicines are reviewed at length with the patient today.  The patient does not have concerns regarding medicines.  The following changes have been made:  no change  Labs/ tests ordered today include: ECG     Disposition:   FU with Dr. [name]  Signed, Lenoard Aden  09/29/2015 9:42 AM    [address], [place], [place]  [address] Phone: [phone]; Fax: [phone]

## 2015-12-29 ENCOUNTER — Ambulatory Visit (INDEPENDENT_AMBULATORY_CARE_PROVIDER_SITE_OTHER): Payer: Medicare Other | Admitting: Cardiovascular Disease

## 2015-12-29 ENCOUNTER — Encounter: Payer: Self-pay | Admitting: Cardiovascular Disease

## 2015-12-29 VITALS — BP 125/77 | HR 56 | Ht 69.0 in | Wt 238.8 lb

## 2015-12-29 DIAGNOSIS — I2510 Atherosclerotic heart disease of native coronary artery without angina pectoris: Secondary | ICD-10-CM | POA: Diagnosis not present

## 2015-12-29 DIAGNOSIS — I714 Abdominal aortic aneurysm, without rupture, unspecified: Secondary | ICD-10-CM

## 2015-12-29 DIAGNOSIS — I4892 Unspecified atrial flutter: Secondary | ICD-10-CM

## 2015-12-29 NOTE — Patient Instructions (Signed)
Your physician recommends that you continue on your current medications as directed. Please refer to the Current Medication list given to you today.  Dr [name] recommends that you schedule a follow-up appointment in 6 months. You will receive a reminder letter in the mail two months in advance. If you don't receive a letter, please call our office to schedule the follow-up appointment.  If you need a refill on your cardiac medications before your next appointment, please call your pharmacy.

## 2015-12-29 NOTE — Progress Notes (Signed)
Patient ID: Jeffrey Stephens, male   DOB: [birth year], 75 y.o.   MRN: [id number]    Cardiology Office Note    Date:  12/29/2015   ID:  Jeffrey Stephens, DOB 1940/12/04, MRN [id number]  PCP:  Irven Shelling, MD  Cardiologist:   Sanda Klein, MD   Chief Complaint  Patient presents with  . Follow-up    3 mo rov. patient reports no problems since last visit.    History of Present Illness:  Jeffrey Stephens is a 75 y.o. male with a history of recurrent persistent atrial flutter that resolved after initiation of sotalol therapy. He is appropriately anticoagulated with Xarelto and has not had any bleeding problems. He has not had any recurrence of palpitations since initiating the sotalol. Overall he feels better, but has noticed some problems with fatigue. His heart rate when he first checked and today was 56 bpm, by the time he left the office it was down to 51 bpm. He denies dizziness, syncope, falls, exertional dyspnea. He can take care of most activities without difficulties and can climb a flight of stairs slowly without stopping, he denies dyspnea at rest.  He initially presented with paroxysmal atrial flutter in August 2015 in the setting of recent left knee surgery. There was evidence of normal left ventricular systolic function by both echo and nuclear scintigraphy, he did not have significant valvular disease in his left atrium was only mildly dilated. He has calcified coronaries on CT of the chest but has never had angina pectoralis. A nuclear study was interpreted as showing possible inferior wall reversible defect (question combination of diaphragmatic attenuation artifact and excessive intestinal activity). He was hospitalized in November 2015 with atrial flutter with rapid ventricular response and converted on treatment with sotalol. As far as we know he has never had any embolic events. He has a remote history of deep venous thrombosis of the lower extremities, brain aneurysm  complicated by secondary hemorrhagic stroke but treated with successful clipping, AAA. He is scheduled to have repeat imaging of a small abdominal aortic aneurysm at [facility] in November.  Past Medical History  Diagnosis Date  . TIA (transient ischemic attack)     [place]  . DVT (deep venous thrombosis) ([place]) 2001    Hx of Left leg   . History of palpitations     OCCASIONAL  . GERD (gastroesophageal reflux disease)   . Constipation   . BPH (benign prostatic hyperplasia)   . Frequency of urination   . Kidney stones   . AAA (abdominal aortic aneurysm) ([place])     followed by Dr. Deitra Mayo  . Stroke [facility]) 1994    "when I had ruptured aneurysm in my head"  . Cerebral aneurysm 1994  . Rheumatoid arthritis ([place])     "qwhere"  . Basal cell carcinoma     "LLE; some on my head"  . History of echocardiogram     a. 2D ECHO: 06/11/2014: EF 55-60%. Normal wall thickness. Indeterminate DD ( atrial flutter ). No RWMA. Mild LA dilation. Normal RV size and systolic function. No significant valvular abnormalities.  . Abnormal nuclear cardiac imaging test 07/13/2014    possible reversible inferior wall defect, felt artifact    Past Surgical History  Procedure Laterality Date  . Cerebral aneurysm repair  1994    Hx of ruptured brain aneurysm Tx by Dr. Ellene Route  . Excisional hemorrhoidectomy  1970's  . Cystoscopy w/ stone manipulation  1980's X 1  . Total  knee arthroplasty Left 05/14/2014    Procedure: LEFT TOTAL KNEE ARTHROPLASTY;  Surgeon: Johnn Hai, MD;  Location: [facility];  Service: Orthopedics;  Laterality: Left;  . Joint replacement    . Basal cell carcinoma excision      "LLE; top of my head"    Current Medications: Outpatient Prescriptions Prior to Visit  Medication Sig Dispense Refill  . acetaminophen (TYLENOL) 325 MG tablet Take 2 tablets (650 mg total) by mouth every 4 (four) hours as needed for headache or mild pain.    Marland Kitchen alfuzosin (UROXATRAL) 10 MG 24 hr tablet  Take 10 mg by mouth daily.    Marland Kitchen apixaban (ELIQUIS) 5 MG TABS tablet Take 1 tablet (5 mg total) by mouth 2 (two) times daily. 60 tablet 11  . BOOSTRIX 5-2.5-18.5 injection     . docusate sodium (COLACE) 100 MG capsule Take 100 mg by mouth 2 (two) times daily as needed for mild constipation.    Marland Kitchen FLUZONE HIGH-DOSE 0.5 ML SUSY Inject 0.5 mLs as directed once. Reported on 05/39/7673  0  . folic acid (FOLVITE) 1 MG tablet Take 1 mg by mouth every morning.     . lansoprazole (PREVACID) 30 MG capsule Take 30 mg by mouth every morning.     . methotrexate (RHEUMATREX) 2.5 MG tablet Take 12.5 mg by mouth every Monday.  1  . Multiple Vitamins-Minerals (CENTRUM SILVER ADULT 50+) TABS Take 50 mg by mouth daily.    Marland Kitchen oxyCODONE-acetaminophen (PERCOCET/ROXICET) 5-325 MG per tablet Take 0.5-1 tablets by mouth every 4 (four) hours as needed for moderate pain. Reported on 09/08/2015    . sotalol (BETAPACE) 120 MG tablet Take 1 tablet (120 mg total) by mouth 2 (two) times daily. 60 tablet 11   No facility-administered medications prior to visit.     Allergies:   Review of patient's allergies indicates no known allergies.   Social History   Social History  . Marital Status: Widowed    Spouse Name: N/A  . Number of Children: N/A  . Years of Education: N/A   Social History Main Topics  . Smoking status: Former Smoker -- 2.50 packs/day for 37 years    Types: Cigarettes    Quit date: 09/25/1992  . Smokeless tobacco: Never Used  . Alcohol Use: No  . Drug Use: No  . Sexual Activity: Not Currently   Other Topics Concern  . None   Social History Narrative     Family History:  The patient's family history includes Cancer in his brother; Diabetes in his father; Heart attack in his father; Heart disease in his brother and father. There is no history of Hypertension or Stroke.   ROS:   Please see the history of present illness.    ROS All other systems reviewed and are negative.   PHYSICAL EXAM:   VS:   BP 125/77 mmHg  Pulse 56  Ht '5\' 9"'$  (1.753 m)  Wt 108.319 kg (238 lb 12.8 oz)  BMI 35.25 kg/m2   GEN: Well nourished, well developed, in no acute distress HEENT: normal Neck: no JVD, carotid bruits, or masses Cardiac: Bradycardia , RRR; no murmurs, rubs, or gallops,no edema  Respiratory:  clear to auscultation bilaterally, normal work of breathing GI: soft, nontender, nondistended, + BS MS: no deformity or atrophy Skin: warm and dry, no rash Neuro:  Alert and Oriented x 3, Strength and sensation are intact Psych: euthymic mood, full affect  Wt Readings from Last 3 Encounters:  12/29/15 108.319  kg (238 lb 12.8 oz)  09/29/15 107.502 kg (237 lb)  09/13/15 110.224 kg (243 lb)      Studies/Labs Reviewed:   EKG:  EKG is ordered today.  The ekg ordered today demonstrates Sinus bradycardia with first-degree AV block (PR interval 214 ms), minor nonspecific ST segment elevation in the inferior leads, normal QT interval (QTC 458, QTC 422 ms).   Lipid Panel    Component Value Date/Time   CHOL 95 06/12/2014 0050   TRIG 80 06/12/2014 0050   HDL 32* 06/12/2014 0050   CHOLHDL 3.0 06/12/2014 0050   VLDL 16 06/12/2014 0050   LDLCALC 47 06/12/2014 0050     ASSESSMENT:    1. Atrial flutter with rapid ventricular response, converted with sotalol   2. Coronary artery calcification seen on CT scan   3. Abdominal aortic aneurysm (AAA) without rupture (HCC)      PLAN:  In order of problems listed above:  1. AFlutter: Currently well controlled on sotalol but he is having some mild symptoms related to bradycardia. Discussed option for pacemaker implantation. EP study and ablation as another option. His electrocardiogram seem to consistently show atrial flutter, no atrial fibrillation. However, ablation may not take away the need for a pacemaker. At this point he feels that his symptoms are quite mild and she does not need additional treatment. He stated "I'll be my own pacemaker for a while".  CHADS Vasc 3 (age 56, vascular disease) 2. Coronary Ca: No evidence of ischemia on functional study 3. AAA: Ultrasound in November 2016 - small, only measures 3.6 cm in maximum diameter, mildly increased compared to 2015  Return in 6 months with renal function/electrolytes, repeat ECG. Call us sooner if he develops more severe symptoms or bradycardia, recurrent palpitations or any bleeding problems.  Medication Adjustments/Labs and Tests Ordered: Current medicines are reviewed at length with the patient today.  Concerns regarding medicines are outlined above.  Medication changes, Labs and Tests ordered today are listed in the Patient Instructions below. There are no Patient Instructions on file for this visit.   Mikael Spray, MD  12/29/2015 9:02 AM    [facility] [address], [place], [place]  [address] Phone: [phone]; Fax: [phone]

## 2015-12-30 ENCOUNTER — Telehealth: Payer: Self-pay | Admitting: Cardiovascular Disease

## 2015-12-30 MED ORDER — SOTALOL HCL 80 MG PO TABS: 80.0000 mg | ORAL_TABLET | Freq: Two times a day (BID) | ORAL | Status: DC

## 2015-12-30 NOTE — Telephone Encounter (Signed)
New message      Pt was seen yesterday.  He said Dr [name] says he may need a pacemaker because his heart rate was down to 51.  Pt says he is on sotalol---one '120mg'$  tablet twice a day.  Can he cut back on this medication and see if his heart rate increases before considering a pacemaker?  Just a thought.  Please call

## 2015-12-30 NOTE — Telephone Encounter (Signed)
Pt called about decreasing his sotalol because of his concern of his low heartrate. Spoke with Dr [name] about the matter. He said a new prescription may be called in for the patient to take 80 mg by mouth BID. Dr. [name] said to explain to patient that his symptoms may not be controlled with the lower dose. This explained to pt and advised pt to call us back if his symptoms returned. Pt verbalized understanding. New Rx ordered to pt pharmacy.

## 2016-01-26 ENCOUNTER — Telehealth: Payer: Self-pay | Admitting: Emergency Medicine

## 2016-01-26 ENCOUNTER — Encounter (HOSPITAL_COMMUNITY): Payer: Self-pay | Admitting: *Deleted

## 2016-01-26 ENCOUNTER — Emergency Department (HOSPITAL_COMMUNITY): Payer: Medicare Other

## 2016-01-26 ENCOUNTER — Other Ambulatory Visit: Payer: Self-pay | Admitting: Emergency Medicine

## 2016-01-26 ENCOUNTER — Ambulatory Visit (INDEPENDENT_AMBULATORY_CARE_PROVIDER_SITE_OTHER): Payer: Medicare Other

## 2016-01-26 ENCOUNTER — Emergency Department (HOSPITAL_COMMUNITY)
Admission: EM | Admit: 2016-01-26 | Discharge: 2016-01-26 | Disposition: A | Discharge status: Home / Self Care | Payer: Medicare Other | Attending: Emergency Medicine | Admitting: Emergency Medicine

## 2016-01-26 ENCOUNTER — Ambulatory Visit (INDEPENDENT_AMBULATORY_CARE_PROVIDER_SITE_OTHER): Payer: Medicare Other | Admitting: Emergency Medicine

## 2016-01-26 VITALS — BP 124/82 | HR 86 | Temp 97.6°F | Resp 16 | Wt 232.0 lb

## 2016-01-26 DIAGNOSIS — K219 Gastro-esophageal reflux disease without esophagitis: Secondary | ICD-10-CM | POA: Insufficient documentation

## 2016-01-26 DIAGNOSIS — Z7901 Long term (current) use of anticoagulants: Secondary | ICD-10-CM | POA: Diagnosis not present

## 2016-01-26 DIAGNOSIS — R197 Diarrhea, unspecified: Secondary | ICD-10-CM | POA: Diagnosis present

## 2016-01-26 DIAGNOSIS — R911 Solitary pulmonary nodule: Secondary | ICD-10-CM | POA: Diagnosis not present

## 2016-01-26 DIAGNOSIS — Z87891 Personal history of nicotine dependence: Secondary | ICD-10-CM | POA: Insufficient documentation

## 2016-01-26 DIAGNOSIS — M069 Rheumatoid arthritis, unspecified: Secondary | ICD-10-CM | POA: Insufficient documentation

## 2016-01-26 DIAGNOSIS — Z96652 Presence of left artificial knee joint: Secondary | ICD-10-CM | POA: Diagnosis not present

## 2016-01-26 DIAGNOSIS — Z79891 Long term (current) use of opiate analgesic: Secondary | ICD-10-CM | POA: Insufficient documentation

## 2016-01-26 DIAGNOSIS — Z79899 Other long term (current) drug therapy: Secondary | ICD-10-CM | POA: Insufficient documentation

## 2016-01-26 DIAGNOSIS — Z8673 Personal history of transient ischemic attack (TIA), and cerebral infarction without residual deficits: Secondary | ICD-10-CM | POA: Insufficient documentation

## 2016-01-26 DIAGNOSIS — I714 Abdominal aortic aneurysm, without rupture: Secondary | ICD-10-CM | POA: Diagnosis not present

## 2016-01-26 DIAGNOSIS — N401 Enlarged prostate with lower urinary tract symptoms: Secondary | ICD-10-CM | POA: Diagnosis not present

## 2016-01-26 DIAGNOSIS — Z85828 Personal history of other malignant neoplasm of skin: Secondary | ICD-10-CM | POA: Insufficient documentation

## 2016-01-26 LAB — POCT CBC
Granulocyte percent: 81.7 %G — AB (ref 37–80)
HCT, POC: 45 % (ref 43.5–53.7)
Hemoglobin: 16.3 g/dL (ref 14.1–18.1)
LYMPH, POC: 1.7 (ref 0.6–3.4)
MCH, POC: 32.9 pg — AB (ref 27–31.2)
MCHC: 36.3 g/dL — AB (ref 31.8–35.4)
MCV: 90.5 fL (ref 80–97)
MID (CBC): 0.2 (ref 0–0.9)
MPV: 7.5 fL (ref 0–99.8)
PLATELET COUNT, POC: 224 10*3/uL (ref 142–424)
POC Granulocyte: 8.7 — AB (ref 2–6.9)
POC LYMPH %: 16.2 % (ref 10–50)
POC MID %: 2.1 %M (ref 0–12)
RBC: 4.97 M/uL (ref 4.69–6.13)
RDW, POC: 14.9 %
WBC: 10.7 10*3/uL — AB (ref 4.6–10.2)

## 2016-01-26 LAB — GLUCOSE, POCT (MANUAL RESULT ENTRY): POC GLUCOSE: 100 mg/dL — AB (ref 70–99)

## 2016-01-26 LAB — COMPLETE METABOLIC PANEL WITH GFR
ALBUMIN: 4.1 g/dL (ref 3.6–5.1)
ALK PHOS: 57 U/L (ref 40–115)
ALT: 10 U/L (ref 9–46)
AST: 16 U/L (ref 10–35)
BILIRUBIN TOTAL: 1 mg/dL (ref 0.2–1.2)
BUN: 13 mg/dL (ref 7–25)
CO2: 18 mmol/L — AB (ref 20–31)
Calcium: 9 mg/dL (ref 8.6–10.3)
Chloride: 108 mmol/L (ref 98–110)
Creat: 1.07 mg/dL (ref 0.70–1.18)
GFR, EST NON AFRICAN AMERICAN: 68 mL/min (ref >=60)
GFR, Est African American: 79 mL/min (ref >=60)
GLUCOSE: 95 mg/dL (ref 65–99)
Potassium: 4.3 mmol/L (ref 3.5–5.3)
SODIUM: 137 mmol/L (ref 135–146)
TOTAL PROTEIN: 6.9 g/dL (ref 6.1–8.1)

## 2016-01-26 LAB — POCT URINALYSIS DIP (MANUAL ENTRY)
Blood, UA: NEGATIVE
Glucose, UA: NEGATIVE
LEUKOCYTES UA: NEGATIVE
NITRITE UA: NEGATIVE
PH UA: 5.5
Spec Grav, UA: 1.025
Urobilinogen, UA: 0.2

## 2016-01-26 LAB — POC MICROSCOPIC URINALYSIS (UMFC)

## 2016-01-26 LAB — I-STAT CHEM 8, ED
BUN: 14 mg/dL (ref 6–20)
CALCIUM ION: 1.2 mmol/L (ref 1.13–1.30)
CHLORIDE: 107 mmol/L (ref 101–111)
CREATININE: 1.1 mg/dL (ref 0.61–1.24)
GLUCOSE: 98 mg/dL (ref 65–99)
HCT: 47 % (ref 39.0–52.0)
Hemoglobin: 16 g/dL (ref 13.0–17.0)
Potassium: 4.2 mmol/L (ref 3.5–5.1)
Sodium: 139 mmol/L (ref 135–145)
TCO2: 22 mmol/L (ref 0–100)

## 2016-01-26 LAB — TSH: TSH: 1.21 m[IU]/L (ref 0.40–4.50)

## 2016-01-26 MED ORDER — DIATRIZOATE MEGLUMINE & SODIUM 66-10 % PO SOLN
30.0000 mL | Freq: Once | ORAL | Status: AC
  Administered 2016-01-26: 30 mL via ORAL

## 2016-01-26 MED ORDER — IOPAMIDOL (ISOVUE-300) INJECTION 61%
100.0000 mL | Freq: Once | INTRAVENOUS | Status: AC | PRN
  Administered 2016-01-26: 100 mL via INTRAVENOUS

## 2016-01-26 MED ORDER — SODIUM CHLORIDE 0.9 % IV BOLUS (SEPSIS)
1000.0000 mL | Freq: Once | INTRAVENOUS | Status: AC
Start: 2016-01-26 — End: 2016-01-26
  Administered 2016-01-26: 1000 mL via INTRAVENOUS

## 2016-01-26 MED ORDER — SODIUM CHLORIDE 0.9 % IV SOLN
INTRAVENOUS | Status: DC
  Administered 2016-01-26: 14:00:00 via INTRAVENOUS

## 2016-01-26 NOTE — Progress Notes (Addendum)
By signing my name below, I, Moises Blood, attest that this documentation has been prepared under the direction and in the presence of Arlyss Queen, MD. Electronically Signed: Moises Blood, [place]. 01/26/2016 , 10:27 AM .  Patient was seen in room 13 .  Chief Complaint:  Chief Complaint  Patient presents with  . GI Problem  . Diarrhea  . Nausea    HPI: Jeffrey Stephens is a 75 y.o. male who reports to [facility] today complaining of occasional abdominal pain with diarrhea and nausea.  Patient reports he believes he picked up a stomach virus 4 days ago. He states he was at church 4 days ago, and had lunch. Afterwards, he went home and his symptoms started. He's taken imodium without relief. He would have diarrhea about 5-6 times a day, and would wake up about every 30 minutes at night with abdominal pain, needing to use the bathroom. He informs his stools being "brown and watery; sometimes whitish-yellow". He also feels nauseated but believes it is due to his lack of sleep. He mentions appetite loss because he's afraid it'll cause him to have more diarrhea. He denies history of diverticulitis. He denies fever, vomiting or blood in stool. He denies knowledge of being on antibiotics recently. He denies any sick contact at home.   He has rheumatoid arthritis, followed by Dr. Trudie Reed.  His cardiologist is Dr. [name].   Past Medical History  Diagnosis Date  . TIA (transient ischemic attack)     [place]  . DVT (deep venous thrombosis) ([place]) 2001    Hx of Left leg   . History of palpitations     OCCASIONAL  . GERD (gastroesophageal reflux disease)   . Constipation   . BPH (benign prostatic hyperplasia)   . Frequency of urination   . Kidney stones   . AAA (abdominal aortic aneurysm) ([place])     followed by Dr. Deitra Mayo  . Stroke [facility]) 1994    "when I had ruptured aneurysm in my head"  . Cerebral aneurysm 1994  . Rheumatoid arthritis ([place])     "qwhere"  . Basal cell  carcinoma     "LLE; some on my head"  . History of echocardiogram     a. 2D ECHO: 06/11/2014: EF 55-60%. Normal wall thickness. Indeterminate DD ( atrial flutter ). No RWMA. Mild LA dilation. Normal RV size and systolic function. No significant valvular abnormalities.  . Abnormal nuclear cardiac imaging test 07/13/2014    possible reversible inferior wall defect, felt artifact   Past Surgical History  Procedure Laterality Date  . Cerebral aneurysm repair  1994    Hx of ruptured brain aneurysm Tx by Dr. Ellene Route  . Excisional hemorrhoidectomy  1970's  . Cystoscopy w/ stone manipulation  1980's X 1  . Total knee arthroplasty Left 05/14/2014    Procedure: LEFT TOTAL KNEE ARTHROPLASTY;  Surgeon: Johnn Hai, MD;  Location: [facility];  Service: Orthopedics;  Laterality: Left;  . Joint replacement    . Basal cell carcinoma excision      "LLE; top of my head"   Social History   Social History  . Marital Status: Widowed    Spouse Name: N/A  . Number of Children: N/A  . Years of Education: N/A   Social History Main Topics  . Smoking status: Former Smoker -- 2.50 packs/day for 37 years    Types: Cigarettes    Quit date: 09/25/1992  . Smokeless tobacco: Never Used  .  Alcohol Use: No  . Drug Use: No  . Sexual Activity: Not Currently   Other Topics Concern  . None   Social History Narrative   Family History  Problem Relation Age of Onset  . Heart disease Father     Aneurysm  . Diabetes Father   . Heart attack Father   . Heart disease Brother     Heart Disease before age 47  . Cancer Brother   . Hypertension Neg Hx   . Stroke Neg Hx    No Known Allergies Prior to Admission medications   Medication Sig Start Date End Date Taking? Authorizing Provider  acetaminophen (TYLENOL) 325 MG tablet Take 2 tablets (650 mg total) by mouth every 4 (four) hours as needed for headache or mild pain. 08/14/14  Yes [name], NP  alfuzosin (UROXATRAL) 10 MG 24 hr tablet Take 10 mg by mouth  daily. 08/23/15  Yes Historical Provider, MD  apixaban (ELIQUIS) 5 MG TABS tablet Take 1 tablet (5 mg total) by mouth 2 (two) times daily. 09/01/15  Yes [name], NP  BOOSTRIX 5-2.5-18.5 injection  09/06/15  Yes Historical Provider, MD  docusate sodium (COLACE) 100 MG capsule Take 100 mg by mouth 2 (two) times daily as needed for mild constipation. 05/14/14  Yes Susa Day, MD  FLUZONE HIGH-DOSE 0.5 ML SUSY Inject 0.5 mLs as directed once. Reported on 09/08/2015 07/02/15  Yes Historical Provider, MD  folic acid (FOLVITE) 1 MG tablet Take 1 mg by mouth every morning.    Yes Historical Provider, MD  lansoprazole (PREVACID) 30 MG capsule Take 30 mg by mouth every morning.    Yes Historical Provider, MD  methotrexate (RHEUMATREX) 2.5 MG tablet Take 12.5 mg by mouth every Monday. 07/29/14  Yes Historical Provider, MD  Multiple Vitamins-Minerals (CENTRUM SILVER ADULT 50+) TABS Take 50 mg by mouth daily.   Yes Historical Provider, MD  oxyCODONE-acetaminophen (PERCOCET/ROXICET) 5-325 MG per tablet Take 0.5-1 tablets by mouth every 4 (four) hours as needed for moderate pain. Reported on 09/08/2015 05/14/14  Yes Susa Day, MD  sotalol (BETAPACE) 80 MG tablet Take 1 tablet (80 mg total) by mouth 2 (two) times daily. 12/30/15  Yes [name], MD     ROS:  Constitutional: negative for fever, chills, night sweats, weight changes; positive for fatigue  HEENT: negative for vision changes, hearing loss, congestion, rhinorrhea, ST, epistaxis, or sinus pressure Cardiovascular: negative for chest pain or palpitations Respiratory: negative for hemoptysis, wheezing, shortness of breath, or cough Abdominal: negative for vomiting, or constipation; positive for abdominal pain, diarrhea, nausea Dermatological: negative for rash Neurologic: negative for headache, dizziness, or syncope All other systems reviewed and are otherwise negative with the exception to those above and in the HPI.  PHYSICAL EXAM: Filed  Vitals:   01/26/16 0957  BP: 124/82  Pulse: 86  Temp: 97.6 F (36.4 C)  Resp: 16   Body mass index is 34.24 kg/(m^2).   General: Alert, no acute distress HEENT:  Normocephalic, atraumatic, oropharynx patent. Eye: Juliette Mangle [facility] Cardiovascular:  Heart sounds distant and difficult to hear;  No Carotid bruits, radial pulse intact. No pedal edema.  Respiratory: Clear to auscultation bilaterally.  No wheezes, rales, or rhonchi.  No cyanosis, no use of accessory musculature Abdominal: normal bowel sounds, no areas of tenderness Musculoskeletal: Gait intact. No edema, tenderness Skin: No rashes. Neurologic: Facial musculature symmetric. Psychiatric: Patient acts appropriately throughout our interaction.  Lymphatic: No cervical or submandibular lymphadenopathy Genitourinary/Anorectal: No acute findings  LABS: Results for orders placed or performed in visit on 01/26/16  POCT CBC  Result Value Ref Range   WBC 10.7 (A) 4.6 - 10.2 K/uL   Lymph, poc 1.7 0.6 - 3.4   POC LYMPH PERCENT 16.2 10 - 50 %L   MID (cbc) 0.2 0 - 0.9   POC MID % 2.1 0 - 12 %M   POC Granulocyte 8.7 (A) 2 - 6.9   Granulocyte percent 81.7 (A) 37 - 80 %G   RBC 4.97 4.69 - 6.13 M/uL   Hemoglobin 16.3 14.1 - 18.1 g/dL   HCT, POC 45.0 43.5 - 53.7 %   MCV 90.5 80 - 97 fL   MCH, POC 32.9 (A) 27 - 31.2 pg   MCHC 36.3 (A) 31.8 - 35.4 g/dL   RDW, POC 14.9 %   Platelet Count, POC 224 142 - 424 K/uL   MPV 7.5 0 - 99.8 fL  POCT glucose (manual entry)  Result Value Ref Range   POC Glucose 100 (A) 70 - 99 mg/dl  POCT urinalysis dipstick  Result Value Ref Range   Color, UA other (A) yellow   Clarity, UA clear clear   Glucose, UA negative negative   Bilirubin, UA small (A) negative   Ketones, POC UA trace (5) (A) negative   Spec Grav, UA 1.025    Blood, UA negative negative   pH, UA 5.5    Protein Ur, POC =30 (A) negative   Urobilinogen, UA 0.2    Nitrite, UA Negative Negative   Leukocytes, UA Negative Negative  POCT  Microscopic Urinalysis ([facility])  Result Value Ref Range   WBC,UR,HPF,POC Few (A) None WBC/hpf   RBC,UR,HPF,POC Few (A) None RBC/hpf   Bacteria Few (A) None, Too numerous to count   Mucus Present (A) Absent   Epithelial Cells, UR Per Microscopy Few (A) None, Too numerous to count cells/hpf   Casts, Ur, LPF, POC hyaline     EKG/XRAY:   Dg Abd Acute W/chest  01/26/2016  CLINICAL DATA:  Abdominal pain.  Diarrhea.  Nausea. EXAM: DG ABDOMEN ACUTE W/ 1V CHEST COMPARISON:  . 16 2016. FINDINGS: Stable bibasilar atelectasis and/or scarring.  Heart size stable. Scattered air-fluid levels are noted throughout the colon. Active process including a diarrheal illness could present this fashion. No evidence of bowel obstruction. No free air. Interposition of the colon under the right hemidiaphragm again noted. Aortoiliac atherosclerotic vascular calcification. Degenerative changes lumbar spine and both hips. Prostate calcifications. IMPRESSION: 1.  Bibasilar atelectasis and/or scarring. 2. Scattered air-fluid levels noted throughout the colon. This could be seen with a diarrheal illness. No bowel distention. 3. Aortoiliac atherosclerotic vascular disease. Electronically Signed   By: Marcello Moores  Register   On: 01/26/2016 11:32    ASSESSMENT/PLAN: Patient's x-ray shows multiple diffuse air-fluid levels. Unclear the source of this. I have ordered a GI pathogen panel by PCR. White count is slightly elevated. He was instructed to go to [facility] so he could be evaluated for CT scanning. I did perform a rectal exam prior to leaving he has no fecal impaction.I personally performed the services described in this documentation, which was scribed in my presence. The recorded information has been reviewed and is accurate. Triage called and they will be waiting for the patient.   Gross sideeffects, risk and benefits, and alternatives of medications d/w patient. Patient is aware that all medications have potential  sideeffects and we are unable to predict every sideeffect or drug-drug interaction that may occur.  Arlyss Queen MD 01/26/2016 10:27 AM

## 2016-01-26 NOTE — ED Notes (Signed)
Pt sent from doctor's office for diarrhea for the past 4 days, pt states his doctor wants him to have a CT. Pt states he has intermittent abdominal pain. Pt states he is nauseated but has not vomited.

## 2016-01-26 NOTE — Patient Instructions (Addendum)
Please go to [facility] to be evaluated for your abnormal acute abdominal series and uncontrolled diarrhea.    IF you received an x-ray today, you will receive an invoice from [facility]. Please contact [facility] at [phone] with questions or concerns regarding your invoice.   IF you received labwork today, you will receive an invoice from [employer]. Please contact [facility] at [phone] with questions or concerns regarding your invoice.   Our billing staff will not be able to assist you with questions regarding bills from these companies.  You will be contacted with the lab results as soon as they are available. The fastest way to get your results is to activate your My Chart account. Instructions are located on the last page of this paperwork. If you have not heard from [place] regarding the results in 2 weeks, please contact this office.

## 2016-01-26 NOTE — Telephone Encounter (Signed)
Call patient and let him know I made a referral to pulmonary for a nodule that was seen on his CT abdomen.

## 2016-01-26 NOTE — ED Provider Notes (Signed)
CSN: [id number]     Arrival date & time 01/26/16  1206 History   First MD Initiated Contact with Patient 01/26/16 1238     Chief Complaint  Patient presents with  . Diarrhea   HPI Pt was sent in by Dr Everlene Farrier for further evaluation of diarrhea and abdominal pain.  Specifically pt was sent to the ED for a CT scan.  At the office he had a CBC and a CMET was also sent off.  Per Dr Everlene Farrier " Jeffrey Stephens is a 75 y.o. male who reports to [facility] today complaining of occasional abdominal pain with diarrhea and nausea.  Patient reports he believes he picked up a stomach virus 4 days ago. He states he was at church 4 days ago, and had lunch. Afterwards, he went home and his symptoms started. He's taken imodium without relief. He would have diarrhea about 5-6 times a day, and would wake up about every 30 minutes at night with abdominal pain, needing to use the bathroom. He informs his stools being "brown and watery; sometimes whitish-yellow". He also feels nauseated but believes it is due to his lack of sleep. He mentions appetite loss because he's afraid it'll cause him to have more diarrhea. He denies history of diverticulitis. He denies fever, vomiting or blood in stool. He denies knowledge of being on antibiotics recently. He denies any sick contact at home. "  Past Medical History  Diagnosis Date  . TIA (transient ischemic attack)     [place]  . DVT (deep venous thrombosis) ([place]) 2001    Hx of Left leg   . History of palpitations     OCCASIONAL  . GERD (gastroesophageal reflux disease)   . Constipation   . BPH (benign prostatic hyperplasia)   . Frequency of urination   . Kidney stones   . AAA (abdominal aortic aneurysm) ([place])     followed by Dr. Deitra Mayo  . Stroke [facility]) 1994    "when I had ruptured aneurysm in my head"  . Cerebral aneurysm 1994  . Rheumatoid arthritis ([place])     "qwhere"  . Basal cell carcinoma     "LLE; some on my head"  . History of echocardiogram      a. 2D ECHO: 06/11/2014: EF 55-60%. Normal wall thickness. Indeterminate DD ( atrial flutter ). No RWMA. Mild LA dilation. Normal RV size and systolic function. No significant valvular abnormalities.  . Abnormal nuclear cardiac imaging test 07/13/2014    possible reversible inferior wall defect, felt artifact   Past Surgical History  Procedure Laterality Date  . Cerebral aneurysm repair  1994    Hx of ruptured brain aneurysm Tx by Dr. Ellene Route  . Excisional hemorrhoidectomy  1970's  . Cystoscopy w/ stone manipulation  1980's X 1  . Total knee arthroplasty Left 05/14/2014    Procedure: LEFT TOTAL KNEE ARTHROPLASTY;  Surgeon: Johnn Hai, MD;  Location: [facility];  Service: Orthopedics;  Laterality: Left;  . Joint replacement    . Basal cell carcinoma excision      "LLE; top of my head"   Family History  Problem Relation Age of Onset  . Heart disease Father     Aneurysm  . Diabetes Father   . Heart attack Father   . Heart disease Brother     Heart Disease before age 1  . Cancer Brother   . Hypertension Neg Hx   . Stroke Neg Hx    Social History  Substance Use Topics  . Smoking status: Former Smoker -- 2.50 packs/day for 37 years    Types: Cigarettes    Quit date: 09/25/1992  . Smokeless tobacco: Never Used  . Alcohol Use: No    Review of Systems  All other systems reviewed and are negative.     Allergies  Review of patient's allergies indicates no known allergies.  Home Medications   Prior to Admission medications   Medication Sig Start Date End Date Taking? Authorizing Provider  acetaminophen (TYLENOL) 325 MG tablet Take 2 tablets (650 mg total) by mouth every 4 (four) hours as needed for headache or mild pain. 08/14/14  Yes [name], NP  alfuzosin (UROXATRAL) 10 MG 24 hr tablet Take 10 mg by mouth daily as needed (urine flow).  08/23/15  Yes Historical Provider, MD  apixaban (ELIQUIS) 5 MG TABS tablet Take 1 tablet (5 mg total) by mouth 2 (two) times daily.  09/01/15  Yes [name], NP  Docusate Calcium (STOOL SOFTENER PO) Take 1 tablet by mouth 2 (two) times daily.   Yes Historical Provider, MD  folic acid (FOLVITE) 1 MG tablet Take 1 mg by mouth every morning.    Yes Historical Provider, MD  lansoprazole (PREVACID) 30 MG capsule Take 30 mg by mouth every morning.    Yes Historical Provider, MD  methotrexate (RHEUMATREX) 2.5 MG tablet Take 12.5 mg by mouth every Monday. 07/29/14  Yes Historical Provider, MD  Multiple Vitamins-Minerals (CENTRUM SILVER ADULT 50+) TABS Take 50 mg by mouth daily.   Yes Historical Provider, MD  oxyCODONE-acetaminophen (PERCOCET/ROXICET) 5-325 MG per tablet Take 0.5-1 tablets by mouth every 4 (four) hours as needed for moderate pain. Reported on 09/08/2015 05/14/14  Yes Susa Day, MD  sotalol (BETAPACE) 80 MG tablet Take 1 tablet (80 mg total) by mouth 2 (two) times daily. 12/30/15  Yes [name], MD  T[place]ic 5-2.5-18.5 injection  09/06/15   Historical Provider, MD  FLUZONE HIGH-DOSE 0.5 ML SUSY Inject 0.5 mLs as directed once. Reported on 09/08/2015 07/02/15   Historical Provider, MD   BP 141/80 mmHg  Pulse 71  Temp(Src) 98 F (36.7 C) (Oral)  Resp 14  SpO2 96% Physical Exam  Constitutional: He appears well-developed and well-nourished. No distress.  HENT:  Head: Normocephalic and atraumatic.  Right Ear: External ear normal.  Left Ear: External ear normal.  Eyes: Conjunctivae are normal. Right eye exhibits no discharge. Left eye exhibits no discharge. No scleral icterus.  Neck: Neck supple. No tracheal deviation present.  Cardiovascular: Normal rate, regular rhythm and intact distal pulses.   Pulmonary/Chest: Effort normal and breath sounds normal. No stridor. No respiratory distress. He has no wheezes. He has no rales.  Abdominal: Soft. Bowel sounds are normal. He exhibits no distension. There is no tenderness. There is no rebound and no guarding.  Musculoskeletal: He exhibits no edema or tenderness.   Neurological: He is alert. He has normal strength. No cranial nerve deficit (no facial droop, extraocular movements intact, no slurred speech) or sensory deficit. He exhibits normal muscle tone. He displays no seizure activity. Coordination normal.  Skin: Skin is warm and dry. No rash noted.  Psychiatric: He has a normal mood and affect.  Nursing note and vitals reviewed.   ED Course  Procedures (including critical care time) Labs Review Labs Reviewed  I-STAT CHEM 8, ED    Imaging Review Ct Abdomen Pelvis W Contrast  01/26/2016  CLINICAL DATA:  Diarrhea for 4 days. Intermittent left lower  quadrant pain EXAM: CT ABDOMEN AND PELVIS WITH CONTRAST TECHNIQUE: Multidetector CT imaging of the abdomen and pelvis was performed using the standard protocol following bolus administration of intravenous contrast. CONTRAST:  151m ISOVUE-300 IOPAMIDOL (ISOVUE-300) INJECTION 61% COMPARISON:  None. FINDINGS: Lower chest: There is no pleural fluid. A pulmonary nodule within the right lower lobe measures 1.9 cm, image number 5 of series 2. On the previous examination this measured 1 cm. Moderate smoking related changes are identified within the lung bases. No pleural fluid. Hepatobiliary: Indeterminate low attenuation structure within the central left lobe of liver measures 1.2 cm, image 15 of series 2. Likely present on previous exam favoring a benign process. The gallbladder appears normal. No biliary dilatation. Pancreas: No mass, inflammatory changes, or other significant abnormality. Spleen: Within normal limits in size and appearance. Adrenals/Urinary Tract: The adrenal glands are normal. Numerous bilateral renal cysts are identified bilaterally. The largest arises from the posterior aspect of the left kidney measuring 5.6 cm. The urinary bladder appears normal. Stomach/Bowel: The stomach is normal. No dilated loops of small or large bowel identified. Distal colonic diverticula noted. No acute inflammation.  Vascular/Lymphatic: Aortic atherosclerosis is noted. The infrarenal abdominal aorta measures 3.6 cm in AP dimension. No enlarged retroperitoneal or mesenteric adenopathy. No enlarged pelvic or inguinal lymph nodes. Reproductive: Prostate gland enlargement is noted. Symmetric appearance of the seminal vesicles. Other: No free fluid or fluid collections identified. Musculoskeletal: Spondylosis is identified throughout the lumbar spine. Bilateral L5 pars defects are identified. There is an anterolisthesis of L5 on S1. IMPRESSION: 1. No acute findings identified within the abdomen or pelvis. 2. Suspicious nodule within the right lower lobe measures 1.9 cm. Consider one of the following in 3 months for both low-risk and high-risk individuals: (a) repeat chest CT, (b) follow-up PET-CT, or (c) tissue sampling. This recommendation follows the consensus statement: Guidelines for Management of Incidental Pulmonary Nodules Detected on CT Images:From the Fleischner Society 2017; published online before print (10.1148/[id number]. 3. Infrarenal abdominal aortic aneurysm measures 3.6 cm. Recommend followup by ultrasound in 2 years. This recommendation follows ACR consensus guidelines: White Paper of the ACR Incidental Findings Committee II on Vascular Findings. J Am Coll Radiol 2013; 10:789-794. 4. Bilateral L5 pars defects with anterolisthesis of L5 on S1. Electronically Signed   By: TKerby MoorsM.D.   On: 01/26/2016 14:25   Dg Abd Acute W/chest  01/26/2016  CLINICAL DATA:  Abdominal pain.  Diarrhea.  Nausea. EXAM: DG ABDOMEN ACUTE W/ 1V CHEST COMPARISON:  . 16 2016. FINDINGS: Stable bibasilar atelectasis and/or scarring.  Heart size stable. Scattered air-fluid levels are noted throughout the colon. Active process including a diarrheal illness could present this fashion. No evidence of bowel obstruction. No free air. Interposition of the colon under the right hemidiaphragm again noted. Aortoiliac atherosclerotic  vascular calcification. Degenerative changes lumbar spine and both hips. Prostate calcifications. IMPRESSION: 1.  Bibasilar atelectasis and/or scarring. 2. Scattered air-fluid levels noted throughout the colon. This could be seen with a diarrheal illness. No bowel distention. 3. Aortoiliac atherosclerotic vascular disease. Electronically Signed   By: TMarcello Moores Register   On: 01/26/2016 11:32   I have personally reviewed and evaluated these images and lab results as part of my medical decision-making.    MDM   Final diagnoses:  Diarrhea, unspecified type  Lung nodule    CT scan is without any acute findings.  No evidence of diverticulitis or colitis to account for the patient's diarrhea. Patient had outpatient stool studies ordered by  Dr. Everlene Farrier.  Patient is stable to follow up as an outpatient  Discussed the lung nodule findings with the patient. He understands follow-up with his primary care doctor for follow-up imaging.  Pt  is aware of the abdominal aortic aneurysm.  He has been seeing Dr Doren Custard for this.    Dorie Rank, MD 01/26/16 1556

## 2016-01-26 NOTE — Discharge Instructions (Signed)

## 2016-01-27 ENCOUNTER — Telehealth: Payer: Self-pay

## 2016-01-27 NOTE — Telephone Encounter (Signed)
IC patient result messages per Dr. Everlene Farrier.  Pt is still taking Immodium and states he is having difficulty having BM.  Advised he stop taking Immodium and drink lots of fluids. Advised to return tomorrow here or to PCP.    Advised pt he will receive call about referral to a pulmonary / lung doctor to follow up on findings on the CT scan.  Pt appreciative of call and understood.

## 2016-01-27 NOTE — Telephone Encounter (Signed)
-----   Message from Darlyne Russian, MD sent at 01/27/2016  7:42 AM EDT ----- Bicarbonate level is slightly low. Be sure he continues to drink good amounts of fluids. Recheck tomorrow either at our office or with his primary care physician if not improving. Be sure he returns his stool so we can send it for analysis.

## 2016-01-28 NOTE — Telephone Encounter (Signed)
FYI Dr Everlene Farrier  Note    Spoke with patient who is not wanting to sched an appt, states he had the nodule checked about 5yrago and everything was fine. No appt scheduled

## 2016-01-28 NOTE — Telephone Encounter (Signed)
Call patient and tell him I would just encourage a second opinion with the doctor. If he wants to discuss this with his PCP  before having any further evaluation that would be fine.

## 2016-01-29 ENCOUNTER — Telehealth: Payer: Self-pay | Admitting: *Deleted

## 2016-01-29 NOTE — Telephone Encounter (Signed)
Patient spoke with primary care doctor about nodule and will follow up with him.

## 2016-02-05 ENCOUNTER — Telehealth: Payer: Self-pay | Admitting: *Deleted

## 2016-02-05 NOTE — Telephone Encounter (Signed)
lmom to see if he is going to bring back stool sample for GI Panel from 01/26/16.

## 2016-02-07 NOTE — Telephone Encounter (Signed)
It is fine that he followed up with his PCP. I just wanted to make sure he got rechecked.

## 2016-02-07 NOTE — Telephone Encounter (Signed)
Dr. Everlene Farrier,  I spoke with patient and he stated that he took stool sample to his PCP office and they sent sample off. Results came back normal. Per patient he was told to either follow up with you or his PCP so he scheduled an appointment with his PCP a few days after seeing you. Please advise if you would like to get records from PCP.   Thanks  [employer]

## 2016-03-08 ENCOUNTER — Other Ambulatory Visit (HOSPITAL_COMMUNITY): Payer: Self-pay | Admitting: Internal Medicine

## 2016-03-08 DIAGNOSIS — R911 Solitary pulmonary nodule: Secondary | ICD-10-CM

## 2016-03-15 ENCOUNTER — Ambulatory Visit (HOSPITAL_COMMUNITY)
Admission: RE | Admit: 2016-03-15 | Discharge: 2016-03-15 | Disposition: A | Discharge status: Home / Self Care | Payer: Medicare Other | Source: Ambulatory Visit | Attending: Internal Medicine | Admitting: Internal Medicine

## 2016-03-15 DIAGNOSIS — R911 Solitary pulmonary nodule: Secondary | ICD-10-CM | POA: Insufficient documentation

## 2016-03-15 DIAGNOSIS — I2510 Atherosclerotic heart disease of native coronary artery without angina pectoris: Secondary | ICD-10-CM | POA: Insufficient documentation

## 2016-03-15 DIAGNOSIS — I714 Abdominal aortic aneurysm, without rupture: Secondary | ICD-10-CM | POA: Diagnosis not present

## 2016-03-15 DIAGNOSIS — R918 Other nonspecific abnormal finding of lung field: Secondary | ICD-10-CM | POA: Diagnosis not present

## 2016-03-15 DIAGNOSIS — J439 Emphysema, unspecified: Secondary | ICD-10-CM | POA: Insufficient documentation

## 2016-03-15 DIAGNOSIS — I700 Atherosclerosis of aorta: Secondary | ICD-10-CM | POA: Diagnosis not present

## 2016-03-15 LAB — GLUCOSE, CAPILLARY: GLUCOSE-CAPILLARY: 108 mg/dL — AB (ref 65–99)

## 2016-03-15 MED ORDER — FLUDEOXYGLUCOSE F - 18 (FDG) INJECTION
11.6000 | Freq: Once | INTRAVENOUS | Status: AC | PRN
  Administered 2016-03-15: 11.6 via INTRAVENOUS

## 2016-03-30 ENCOUNTER — Other Ambulatory Visit: Payer: Self-pay | Admitting: *Deleted

## 2016-03-30 ENCOUNTER — Encounter: Payer: Self-pay | Admitting: Cardiothoracic Surgery

## 2016-03-30 ENCOUNTER — Institutional Professional Consult (permissible substitution) (INDEPENDENT_AMBULATORY_CARE_PROVIDER_SITE_OTHER): Payer: Medicare Other | Admitting: Cardiothoracic Surgery

## 2016-03-30 VITALS — BP 119/78 | HR 70 | Resp 16 | Ht 69.0 in | Wt 232.0 lb

## 2016-03-30 DIAGNOSIS — I482 Chronic atrial fibrillation, unspecified: Secondary | ICD-10-CM

## 2016-03-30 DIAGNOSIS — R918 Other nonspecific abnormal finding of lung field: Secondary | ICD-10-CM | POA: Diagnosis not present

## 2016-03-30 DIAGNOSIS — D381 Neoplasm of uncertain behavior of trachea, bronchus and lung: Secondary | ICD-10-CM

## 2016-03-30 DIAGNOSIS — C7931 Secondary malignant neoplasm of brain: Secondary | ICD-10-CM

## 2016-03-30 NOTE — Progress Notes (Signed)
PCP is Irven Shelling, MD Referring Provider is Lavone Orn, MD  Chief Complaint  Patient presents with  . Lung Lesion    Surgical eval, PET Scan 03/15/16, CT ABD 01/26/16, LUNG NODULES HAVE BEEN FOLLOWED FOR YEARS   Patient examined, recent CT scans and PET scan personally reviewed and counseled with patient  HPI: Patient presents for evaluation and therapy of a 2 cm pulmonary nodule in the right lower lobe medial aspect close to the inferior pulmonary vein. This has increased in size from 1 cm from CT scan in 2014. Patient is asymptomatic. The abdominal CT scans were performed because of abdominal complaints and to follow a 3.8 cm AAA which is followed by [facility] Dr. Scot Dock. The patient stopped smoking 1994. He denies shortness of breath with exertion, productive cough, hemoptysis, chest pain or bone pain. The patient is status post craniotomy 1994 for a cerebral aneurysm that has no recent new neurologic symptoms.  PET scan shows the nodule to have intense hypermetabolic activity with SUV of 23.1. There is no abnormal activity in the mediastinal nodes or other areas of the chest or abdomen. CT scan of brain is pending  The patient has cardiac history of atrial fibrillation-flutter followed by Dr. [name] is on chronic Elliquis-no history of stroke or mini stroke. A perfusion myocardial scan was performed in 2015 which was low risk for ischemia--the patient had calcified coronary arteries.  The patient has rheumatoid arthritis and takes methotrexate weekly for 25 years with good relief. The patient's rheumatoid arthritis limits his mobility and his usual walking interval is proximally 5 minutes. Past Medical History  Diagnosis Date  . TIA (transient ischemic attack)     [place]  . DVT (deep venous thrombosis) ([place]) 2001    Hx of Left leg   . History of palpitations     OCCASIONAL  . GERD (gastroesophageal reflux disease)   . Constipation   . BPH (benign prostatic  hyperplasia)   . Frequency of urination   . Kidney stones   . AAA (abdominal aortic aneurysm) ([place])     followed by Dr. Deitra Mayo  . Stroke [facility]) 1994    "when I had ruptured aneurysm in my head"  . Cerebral aneurysm 1994  . Rheumatoid arthritis ([place])     "qwhere"  . Basal cell carcinoma     "LLE; some on my head"  . History of echocardiogram     a. 2D ECHO: 06/11/2014: EF 55-60%. Normal wall thickness. Indeterminate DD ( atrial flutter ). No RWMA. Mild LA dilation. Normal RV size and systolic function. No significant valvular abnormalities.  . Abnormal nuclear cardiac imaging test 07/13/2014    possible reversible inferior wall defect, felt artifact    Past Surgical History  Procedure Laterality Date  . Cerebral aneurysm repair  1994    Hx of ruptured brain aneurysm Tx by Dr. Ellene Route  . Excisional hemorrhoidectomy  1970's  . Cystoscopy w/ stone manipulation  1980's X 1  . Total knee arthroplasty Left 05/14/2014    Procedure: LEFT TOTAL KNEE ARTHROPLASTY;  Surgeon: Johnn Hai, MD;  Location: [facility];  Service: Orthopedics;  Laterality: Left;  . Joint replacement    . Basal cell carcinoma excision      "LLE; top of my head"    Family History  Problem Relation Age of Onset  . Heart disease Father     Aneurysm  . Diabetes Father   . Heart attack Father   . Heart disease  Brother     Heart Disease before age 12  . Cancer Brother   . Hypertension Neg Hx   . Stroke Neg Hx     Social History Social History  Substance Use Topics  . Smoking status: Former Smoker -- 2.50 packs/day for 37 years    Types: Cigarettes    Quit date: 09/25/1992  . Smokeless tobacco: Never Used  . Alcohol Use: No    Current Outpatient Prescriptions  Medication Sig Dispense Refill  . acetaminophen (TYLENOL) 325 MG tablet Take 2 tablets (650 mg total) by mouth every 4 (four) hours as needed for headache or mild pain.    Marland Kitchen apixaban (ELIQUIS) 5 MG TABS tablet Take 1 tablet (5 mg total)  by mouth 2 (two) times daily. 60 tablet 11  . Docusate Calcium (STOOL SOFTENER PO) Take 1 tablet by mouth 2 (two) times daily.    . folic acid (FOLVITE) 1 MG tablet Take 1 mg by mouth every morning.     . lansoprazole (PREVACID) 30 MG capsule Take 30 mg by mouth every morning.     . methotrexate (RHEUMATREX) 2.5 MG tablet Take 12.5 mg by mouth every Monday.  1  . Multiple Vitamins-Minerals (CENTRUM SILVER ADULT 50+) TABS Take 50 mg by mouth daily.    Marland Kitchen oxyCODONE-acetaminophen (PERCOCET/ROXICET) 5-325 MG per tablet Take 0.5-1 tablets by mouth every 4 (four) hours as needed for moderate pain. Reported on 09/08/2015    . sotalol (BETAPACE) 80 MG tablet Take 1 tablet (80 mg total) by mouth 2 (two) times daily. 180 tablet 3   No current facility-administered medications for this visit.    No Known Allergies  Review of Systems   The patient has had previous knee replacement on left without cardiac or pulmonary complications. The patient is right-hand dominant. The patient denies previous history of thoracic trauma pneumothorax. No family history of lung cancer.        Review of Systems :  [ y ] = yes, [  ] = no        General :  Weight gain [   ]    Weight loss  [   ]  Fatigue [  ]  Fever [  ]  Chills  [  ]                                Weakness  [  ]           HEENT    Headache [  ]  Dizziness [  ]  Blurred vision [  ] Glaucoma  [  ]                          Nosebleeds [  ] Painful or loose teeth [  ]        Cardiac :  Chest pain/ pressure [  ]  Resting SOB [  ] exertional SOB [  ]                        Orthopnea [  ]  Pedal edema  [  ]  Palpitations [  ] Syncope/presyncope '[ ]'$                         Paroxysmal nocturnal dyspnea [  ]  Pulmonary : cough [  ]  wheezing [  ]  Hemoptysis [  ] Sputum [  ] Snoring [  ]                              Pneumothorax [  ]  Sleep apnea [  ]        GI : Vomiting [  ]  Dysphagia [  ]  Melena  [  ]  Abdominal pain [  ] BRBPR [  ]               Heart burn [  ]  Constipation [  ] Diarrhea  [  ] Colonoscopy [   ]        GU : Hematuria [  ]  Dysuria [  ]  Nocturia [  ] UTI's [  ]        Vascular : Claudication [  ]  Rest pain [  ]  DVT [  ] Vein stripping [  ] leg ulcers [  ]                          TIA [  ] Stroke [  ]  Varicose veins [  ]        NEURO :  Headaches  [  ] Seizures [  ] Vision changes [  ] Paresthesias [  ]                                       Seizures [  ]        Musculoskeletal :  Arthritis [ yes ] Gout  [  ]  Back pain [  ]  Joint pain [ yes ]   status post left total knee replacement        Skin :  Rash [  ]  Melanoma [  ] Sores [  ]        Heme : Bleeding problems [  ]Clotting Disorders [  ] Anemia [  ]Blood Transfusion '[ ]'$         Endocrine : Diabetes [  ] Heat or Cold intolerance [  ] Polyuria [  ]excessive thirst '[ ]'$         Psych : Depression [  ]  Anxiety [  ]  Psych hospitalizations [  ] Memory change [  ]                    BP 119/78 mmHg  Pulse 70  Resp 16  Ht '5\' 9"'$  (1.753 m)  Wt 232 lb (105.235 kg)  BMI 34.24 kg/m2  SpO2 96% Physical Exam      Physical Exam  General: Pleasant overweight 75 year old male no acute distress HEENT: Normocephalic pupils equal , dentition . Upper and lower plates Neck: Supple without JVD, adenopathy, or bruit Chest: Clear to auscultation, symmetrical breath sounds, no rhonchi, no tenderness             or deformity Cardiovascular: Regular rate and rhythm, no murmur, no gallop, peripheral pulses             palpable in all extremities Abdomen:  Soft, obese, nontender, no palpable mass or organomegaly Extremities: Warm, well-perfused, no clubbing cyanosis edema or tenderness,  no venous stasis changes of the legs Rectal/GU: Deferred Neuro: Grossly non--focal and symmetrical throughout Skin: Clean and dry without rash or ulceration   Diagnostic Tests: CT scan and PET scan show a 2 cm pulmonary nodule right lower lobe very suspicious for a  primary bronchogenic carcinoma. There is no evidence of mediastinal or distant metastatic involvement.  Impression: The patient was recommended to have the right lower lobe pulmonary nodule resected as it has shown clear increase in size over time and is associated with intense hypermetabolic activity. The patient's cardiac and pulmonary status appear to be adequate for VATS/lobectomy. Prior to surgery we will complete clinical staging with a brain CT scan, obtained poorly function test to document adequate pulmonary reserve prior to resection, and cardiology clearance from his cardiologist Dr. [name] Plan: Return to  office to discuss the above studies and to set a date for surgery. The Eliquis will be stopped 5 days prior to surgery. The methotrexate will be held after surgery for about 2 weeks.  Len Childs, MD [facility] [phone]

## 2016-04-04 ENCOUNTER — Ambulatory Visit
Admission: RE | Admit: 2016-04-04 | Discharge: 2016-04-04 | Disposition: A | Discharge status: Home / Self Care | Payer: Medicare Other | Source: Ambulatory Visit | Attending: Cardiothoracic Surgery | Admitting: Cardiothoracic Surgery

## 2016-04-04 DIAGNOSIS — R918 Other nonspecific abnormal finding of lung field: Secondary | ICD-10-CM

## 2016-04-04 DIAGNOSIS — C7931 Secondary malignant neoplasm of brain: Secondary | ICD-10-CM

## 2016-04-04 MED ORDER — IOPAMIDOL (ISOVUE-300) INJECTION 61%
75.0000 mL | Freq: Once | INTRAVENOUS | Status: AC | PRN
  Administered 2016-04-04: 75 mL via INTRAVENOUS

## 2016-04-06 ENCOUNTER — Ambulatory Visit (HOSPITAL_COMMUNITY)
Admission: RE | Admit: 2016-04-06 | Discharge: 2016-04-06 | Disposition: A | Discharge status: Home / Self Care | Payer: Medicare Other | Source: Ambulatory Visit | Attending: Cardiothoracic Surgery | Admitting: Cardiothoracic Surgery

## 2016-04-06 DIAGNOSIS — R911 Solitary pulmonary nodule: Secondary | ICD-10-CM | POA: Diagnosis present

## 2016-04-06 DIAGNOSIS — R918 Other nonspecific abnormal finding of lung field: Secondary | ICD-10-CM

## 2016-04-06 LAB — PULMONARY FUNCTION TEST
DL/VA % pred: 43 %
DL/VA: 1.94 ml/min/mmHg/L
DLCO unc % pred: 43 %
DLCO unc: 12.33 ml/min/mmHg
FEF 25-75 Post: 0.85 L/sec
FEF 25-75 Pre: 0.77 L/sec
FEF2575-%Change-Post: 9 %
FEF2575-%Pred-Post: 41 %
FEF2575-%Pred-Pre: 38 %
FEV1-%Change-Post: 0 %
FEV1-%Pred-Post: 80 %
FEV1-%Pred-Pre: 80 %
FEV1-Post: 2.2 L
FEV1-Pre: 2.21 L
FEV1FVC-%Change-Post: -5 %
FEV1FVC-%Pred-Pre: 72 %
FEV6-%Change-Post: 4 %
FEV6-%Pred-Post: 108 %
FEV6-%Pred-Pre: 103 %
FEV6-Post: 3.85 L
FEV6-Pre: 3.69 L
FEV6FVC-%Change-Post: 0 %
FEV6FVC-%Pred-Post: 93 %
FEV6FVC-%Pred-Pre: 93 %
FVC-%Change-Post: 4 %
FVC-%Pred-Post: 115 %
FVC-%Pred-Pre: 110 %
FVC-Post: 4.39 L
FVC-Pre: 4.19 L
Post FEV1/FVC ratio: 50 %
Post FEV6/FVC ratio: 88 %
Pre FEV1/FVC ratio: 53 %
Pre FEV6/FVC Ratio: 88 %
RV % pred: 118 %
RV: 2.79 L
TLC % pred: 114 %
TLC: 7.34 L

## 2016-04-06 MED ORDER — ALBUTEROL SULFATE (2.5 MG/3ML) 0.083% IN NEBU
2.5000 mg | INHALATION_SOLUTION | Freq: Once | RESPIRATORY_TRACT | Status: AC
  Administered 2016-04-06: 2.5 mg via RESPIRATORY_TRACT

## 2016-04-19 ENCOUNTER — Encounter: Payer: Self-pay | Admitting: Nurse Practitioner

## 2016-04-19 ENCOUNTER — Ambulatory Visit (INDEPENDENT_AMBULATORY_CARE_PROVIDER_SITE_OTHER): Payer: Medicare Other | Admitting: Nurse Practitioner

## 2016-04-19 ENCOUNTER — Ambulatory Visit (INDEPENDENT_AMBULATORY_CARE_PROVIDER_SITE_OTHER): Payer: Medicare Other | Admitting: Cardiothoracic Surgery

## 2016-04-19 ENCOUNTER — Encounter: Payer: Self-pay | Admitting: Cardiothoracic Surgery

## 2016-04-19 VITALS — BP 115/68 | HR 57 | Ht 69.0 in | Wt 231.4 lb

## 2016-04-19 VITALS — BP 120/73 | HR 64 | Resp 16 | Ht 69.0 in | Wt 231.0 lb

## 2016-04-19 DIAGNOSIS — Z01810 Encounter for preprocedural cardiovascular examination: Secondary | ICD-10-CM

## 2016-04-19 DIAGNOSIS — I4892 Unspecified atrial flutter: Secondary | ICD-10-CM | POA: Diagnosis not present

## 2016-04-19 DIAGNOSIS — I482 Chronic atrial fibrillation, unspecified: Secondary | ICD-10-CM

## 2016-04-19 DIAGNOSIS — R911 Solitary pulmonary nodule: Secondary | ICD-10-CM | POA: Diagnosis not present

## 2016-04-19 DIAGNOSIS — D381 Neoplasm of uncertain behavior of trachea, bronchus and lung: Secondary | ICD-10-CM

## 2016-04-19 NOTE — Progress Notes (Signed)
Office Visit    Patient Name: Jeffrey Stephens Date of Encounter: 04/19/2016  Primary Care Provider:  Irven Shelling, MD Primary Cardiologist:  Jerilynn Mages. Croitoru, MD   Chief Complaint    75 year old male with a history of paroxysmal atrial flutter, abdominal aortic aneurysm, and recent finding of hypermetabolic lung nodule, presents for preoperative evaluation.  Past Medical History    Past Medical History:  Diagnosis Date  . AAA (abdominal aortic aneurysm) ([place])    a. followed by Dr. Deitra Mayo - 01/2016 CT: 3.6 cm infrarenal AAA.  Marland Kitchen Abnormal nuclear cardiac imaging test 07/13/2014   a. 06/2014 MV: possible reversible inferior wall defect,no wma, nl EF, felt to be artifact.  . Basal cell carcinoma    "LLE; some on my head"  . BPH (benign prostatic hyperplasia)   . Cerebral aneurysm 1994  . Constipation   . DVT (deep venous thrombosis) ([place]) 2001   Hx of Left leg   . Frequency of urination   . GERD (gastroesophageal reflux disease)   . History of echocardiogram    a. 2D ECHO: 06/11/2014: EF 55-60%. Normal wall thickness. Indeterminate DD ( atrial flutter ). No RWMA. Mild LA dilation. Normal RV size and systolic function. No significant valvular abnormalities.  . History of palpitations    OCCASIONAL  . Kidney stones   . Paroxysmal atrial flutter ([place])    a. On eliquis and sotalol (CHA2DS2VASc = 1).  . Rheumatoid arthritis ([place])    "qwhere"  . Stroke [facility]) 1994   "when I had ruptured aneurysm in my head"  . TIA (transient ischemic attack)    1994 WITH ANEYRYSM   Past Surgical History:  Procedure Laterality Date  . BASAL CELL CARCINOMA EXCISION     "LLE; top of my head"  . CEREBRAL ANEURYSM REPAIR  1994   Hx of ruptured brain aneurysm Tx by Dr. Ellene Route  . CYSTOSCOPY W/ STONE MANIPULATION  1980's X 1  . EXCISIONAL HEMORRHOIDECTOMY  1970's  . JOINT REPLACEMENT    . TOTAL KNEE ARTHROPLASTY Left 05/14/2014   Procedure: LEFT TOTAL KNEE ARTHROPLASTY;  Surgeon:  Johnn Hai, MD;  Location: [facility];  Service: Orthopedics;  Laterality: Left;    Allergies  No Known Allergies  History of Present Illness    75 year old male with a Past medical history. He has a history of paroxysmal atrial flutter dating back to August 2015. Echo showed normal LV function at that time. Stress testing was also undertaken and was felt to be intermediate in the setting of finding of possible reversible inferior defect, that ultimately, it was felt that this was likely artifact in the setting of attenuation and excessive intestinal activity. His atrial flutter has been managed with sotalol therapy and he has not been having palpitations. He is anticoagulated with eliquis.  He has no history of chest pain. He has some degree of chronic, stable dyspnea on exertion. He tries to be active, sometimes working out in his yard or walking for 15-20 minutes, 2-3 times per week. He has not had any recent changes in exercise tolerance or new symptoms.  In May of this year, he was having some abdominal complaints and had a CT scan, which showed a stable 3.8 cm abdominal aortic aneurysm and incidentally showed a right lower lobe nodule. This was followed with PET scan in June of this year, revealing an intensely hypermetabolic 2.0 x 1.5 cm central right lower lobe pulmonary nodule. This was felt to be consistent with  primary bronchogenic carcinoma and he was subsequently referred to thoracic surgery for evaluation. He is tentatively awaiting right-sided VATS/lobectomy and has been referred for cardiac risk assessment.   Home Medications    Prior to Admission medications   Medication Sig Start Date End Date Taking? Authorizing Provider  acetaminophen (TYLENOL) 325 MG tablet Take 2 tablets (650 mg total) by mouth every 4 (four) hours as needed for headache or mild pain. 08/14/14  Yes [name], NP  apixaban (ELIQUIS) 5 MG TABS tablet Take 1 tablet (5 mg total) by mouth 2 (two) times daily.  09/01/15  Yes [name], NP  Docusate Calcium (STOOL SOFTENER PO) Take 1 tablet by mouth 2 (two) times daily.   Yes Historical Provider, MD  folic acid (FOLVITE) 1 MG tablet Take 1 mg by mouth every morning.    Yes Historical Provider, MD  lansoprazole (PREVACID) 30 MG capsule Take 30 mg by mouth every morning.    Yes Historical Provider, MD  methotrexate (RHEUMATREX) 2.5 MG tablet Take 12.5 mg by mouth every Monday. 07/29/14  Yes Historical Provider, MD  Multiple Vitamins-Minerals (CENTRUM SILVER ADULT 50+) TABS Take 50 mg by mouth daily.   Yes Historical Provider, MD  oxyCODONE-acetaminophen (PERCOCET/ROXICET) 5-325 MG per tablet Take 0.5-1 tablets by mouth every 4 (four) hours as needed for moderate pain. Reported on 09/08/2015 05/14/14  Yes Susa Day, MD  sotalol (BETAPACE) 80 MG tablet Take 1 tablet (80 mg total) by mouth 2 (two) times daily. 12/30/15  Yes [name], MD    Review of Systems    As above, patient is some degree of chronic stable dyspnea on exertion. He denies chest pain, palpitations, PND, orthopnea, dizziness, syncope, edema, or early satiety.  All other systems reviewed and are otherwise negative except as noted above.  Physical Exam    VS:  BP 115/68 (BP Location: Left Arm)   Pulse (!) 57   Ht '5\' 9"'$  (1.753 m)   Wt 231 lb 6.4 oz (105 kg)   BMI 34.17 kg/m  , BMI Body mass index is 34.17 kg/m. GEN: Well nourished, well developed, in no acute distress.  HEENT: normal.  Neck: Supple, no JVD, carotid bruits, or masses. Cardiac: RRR, no murmurs, rubs, or gallops. No clubbing, cyanosis, edema.  Radials/DP/PT 2+ and equal bilaterally.  Respiratory:  Respirations regular and unlabored, clear to auscultation bilaterally. GI: Soft, nontender, nondistended, BS + x 4. MS: no deformity or atrophy. Skin: warm and dry, no rash. Neuro:  Strength and sensation are intact. Psych: Normal affect.  Accessory Clinical Findings    ECG - Sinus bradycardia, 57, first-degree  AV block, no acute ST or T changes.  Assessment & Plan    1.  Preoperative cardiovascular examination/right lower lobe nodule with high suspicion for primary bronchogenic carcinoma: He has prior history of pulmonary nodule and recent abdominal CT showed increase in size. Follow-up PET scan confirming hypermetabolic activity. Patient has been seen by thoracic surgery with tentative plan for right-sided VATS/lobectomy. From a cardiac standpoint, he has been stable. He had a stress test in 2015, which was notable for possible inferior reversible defect, however there was significant attenuation and visceral uptake, and ultimately it was felt that those findings were artifact. He does not experience chest pain. He is somewhat active, walking a few times per week without significant limitations. He does have some degree of chronic, stable, dyspnea on exertion. CT scans in the past been notable for coronary calcification dating back at least  as far as April 2010. Given his stability, he is felt to be low risk for cardiac complication and may proceed with surgery without any additional cardiac testing at this time.  2. Paroxysmal atrial flutter: He has not been having any recent palpitations. He is managed with sotalol 80 mg twice a day, and this should be continued throughout the perioperative period. He is on eliquis and in the absence of prior embolic event (note reported prior history of stroke related to cerebral aneurysm, not embolus), he may safely come off of eliquis prior to his surgery without the requirement for bridging.  3. Abdominal aortic aneurysm: Stable. Followed by Dr. Scot Dock.  4.  Disposition: Patient will follow-up with Dr. [name] in 6 months or sooner if necessary.  Murray Hodgkins, NP 04/19/2016, 1:31 PM

## 2016-04-19 NOTE — H&P (Signed)
PCP is Irven Shelling, MD Referring Provider is Lavone Orn, MD  Chief Complaint  Patient presents with  . Follow-up    after CT BRAIN, PFT and CARDIAC CLEARANCE which is approved and no bridging required.      HPI: 2 cm pulmonary nodule in the medial aspect of the right lower lobe close to inferior pulmonary vein hypermetabolic on PET scan with SUV of 23. Gradually enlarging over the past 2-3 years consistent with bronchogenic carcinoma.  History of atrial fib-flutter on chronic Eliquis Low risk myocardial perfusion scan 2015 Rheumatoid arthritis on chronic methotrexate 3.6 cm AAA, stable History of  smoking quit 1994   The patient returns for further discussion of the right lower lobe hypermetabolic nodule. The patient obtained a brain CT scan which showed no evidence of metastatic disease. The patient underwent pulmonary function testing which showed adequate pulmonary mechanics-FVC and FEV1 80-90 percent of predicted. Diffusion capacity was low at 46% predicted. However I walk the patient 200 feet in the office andh is room air oxygen saturation increased from 94% to 96% The patient was seen by his cardiologist Dr [name] who feels the patient has stable angina adequate LV function and is cleared for surgery. Past Medical History:  Diagnosis Date  . AAA (abdominal aortic aneurysm) ([place])    a. followed by Dr. Deitra Mayo - 01/2016 CT: 3.6 cm infrarenal AAA.  Marland Kitchen Abnormal nuclear cardiac imaging test 07/13/2014   a. 06/2014 MV: possible reversible inferior wall defect,no wma, nl EF, felt to be artifact.  . Basal cell carcinoma    "LLE; some on my head"  . BPH (benign prostatic hyperplasia)   . Cerebral aneurysm 1994  . Constipation   . DVT (deep venous thrombosis) ([place]) 2001   Hx of Left leg   . Frequency of urination   . GERD (gastroesophageal reflux disease)   . History of echocardiogram    a. 2D ECHO: 06/11/2014: EF 55-60%. Normal wall thickness. Indeterminate  DD ( atrial flutter ). No RWMA. Mild LA dilation. Normal RV size and systolic function. No significant valvular abnormalities.  . History of palpitations    OCCASIONAL  . Kidney stones   . Lung nodule    a. 02/2016 PET scan: intensely hypermetabolic 2.0 x 1.5 cm central RLL nodule (SUV 23).  . Paroxysmal atrial flutter ([place])    a. On eliquis and sotalol (CHA2DS2VASc = 1).  . Rheumatoid arthritis ([place])    "qwhere"  . Stroke [facility]) 1994   "when I had ruptured aneurysm in my head"  . TIA (transient ischemic attack)    1994 WITH ANEYRYSM    Past Surgical History:  Procedure Laterality Date  . BASAL CELL CARCINOMA EXCISION     "LLE; top of my head"  . CEREBRAL ANEURYSM REPAIR  1994   Hx of ruptured brain aneurysm Tx by Dr. Ellene Route  . CYSTOSCOPY W/ STONE MANIPULATION  1980's X 1  . EXCISIONAL HEMORRHOIDECTOMY  1970's  . JOINT REPLACEMENT    . TOTAL KNEE ARTHROPLASTY Left 05/14/2014   Procedure: LEFT TOTAL KNEE ARTHROPLASTY;  Surgeon: Johnn Hai, MD;  Location: [facility];  Service: Orthopedics;  Laterality: Left;    Family History  Problem Relation Age of Onset  . Heart disease Father     Aneurysm  . Diabetes Father   . Heart attack Father   . Heart disease Brother     Heart Disease before age 72  . Cancer Brother   . Hypertension Neg Hx   .  Stroke Neg Hx     Social History Social History  Substance Use Topics  . Smoking status: Former Smoker    Packs/day: 2.50    Years: 37.00    Types: Cigarettes    Quit date: 09/25/1992  . Smokeless tobacco: Never Used  . Alcohol use No    Current Outpatient Prescriptions  Medication Sig Dispense Refill  . acetaminophen (TYLENOL) 325 MG tablet Take 2 tablets (650 mg total) by mouth every 4 (four) hours as needed for headache or mild pain.    Marland Kitchen apixaban (ELIQUIS) 5 MG TABS tablet Take 1 tablet (5 mg total) by mouth 2 (two) times daily. 60 tablet 11  . Docusate Calcium (STOOL SOFTENER PO) Take 1 tablet by mouth 2 (two) times daily.     . folic acid (FOLVITE) 1 MG tablet Take 1 mg by mouth every morning.     . lansoprazole (PREVACID) 30 MG capsule Take 30 mg by mouth every morning.     . methotrexate (RHEUMATREX) 2.5 MG tablet Take 12.5 mg by mouth every Monday.  1  . Multiple Vitamins-Minerals (CENTRUM SILVER ADULT 50+) TABS Take 50 mg by mouth daily.    Marland Kitchen oxyCODONE-acetaminophen (PERCOCET/ROXICET) 5-325 MG per tablet Take 0.5-1 tablets by mouth every 4 (four) hours as needed for moderate pain. Reported on 09/08/2015    . sotalol (BETAPACE) 80 MG tablet Take 1 tablet (80 mg total) by mouth 2 (two) times daily. 180 tablet 3   No current facility-administered medications for this visit.     No Known Allergies  Review of Systems   No changes since prior exam 7-6-017  BP 120/73   Pulse 64   Resp 16   Ht '5\' 9"'$  (1.753 m)   Wt 231 lb (104.8 kg)   SpO2 94% Comment: ON RA  BMI 34.11 kg/m  Physical Exam     Physical Exam  General: Pleasant obese 75 year old Caucasian male no acute distress HEENT: Normocephalic pupils equal , dentition adequate Neck: Supple without JVD, adenopathy, or bruit Chest: Clear to auscultation, symmetrical breath sounds, no rhonchi, no tenderness             or deformity Cardiovascular: Regular rate and rhythm, no murmur, no gallop, peripheral pulses             palpable in all extremities Abdomen:  Soft, nontender, no palpable mass or organomegaly Extremities: Warm, well-perfused, no clubbing cyanosis edema or tenderness,              no venous stasis changes of the legs Rectal/GU: Deferred Neuro: Grossly non--focal and symmetrical throughout Skin: Clean and dry without rash or ulceration   Diagnostic Tests: The results of the interim testing including PFTs, brain scan, and cardiology evaluation were discussed and counseled with patient and wife.  Impression: Patient appears to have a clinical stage I probable bronchogenic carcinoma the medial segment of the right lower lobe.  Surgical resection would be his best option for cure. Cardiac and pulmonary reserve are adequate for VATS and pulmonary resection. Would try to perform a lobe sparing adequate wedge resection with lymph node dissection I discussed the indications and expected benefits right VATS and resection of the pulmonary mass with the patient and his wife. Than a stent that this would provide the best chance for long-term cure but it would involve a 5% risk of major mortality-morbidity. The patient will stop his Eliquis 4 days prior to surgery.. Plan: Right VATS and pulmonary section at [facility] on  Monday, August 14.  Len Childs, MD [facility] [phone]

## 2016-04-19 NOTE — Progress Notes (Signed)
Thanks, Gerald Stabs. I agree. MCr

## 2016-04-19 NOTE — Patient Instructions (Signed)
Jeffrey Bayley, NP, recommends that you schedule a follow-up appointment in 6 months with Dr [name]. You will receive a reminder letter in the mail two months in advance. If you don't receive a letter, please call our office to schedule the follow-up appointment.  If you need a refill on your cardiac medications before your next appointment, please call your pharmacy.

## 2016-04-20 ENCOUNTER — Other Ambulatory Visit: Payer: Self-pay | Admitting: *Deleted

## 2016-04-20 DIAGNOSIS — R911 Solitary pulmonary nodule: Secondary | ICD-10-CM

## 2016-04-27 NOTE — Addendum Note (Signed)
Addended by: Cristopher Estimable on: 04/27/2016 11:53 AM   Modules accepted: Orders

## 2016-04-28 ENCOUNTER — Other Ambulatory Visit: Payer: Self-pay | Admitting: *Deleted

## 2016-04-28 DIAGNOSIS — R911 Solitary pulmonary nodule: Secondary | ICD-10-CM

## 2016-05-01 ENCOUNTER — Ambulatory Visit
Admission: RE | Admit: 2016-05-01 | Discharge: 2016-05-01 | Disposition: A | Discharge status: Home / Self Care | Payer: Medicare Other | Source: Ambulatory Visit | Attending: Cardiothoracic Surgery | Admitting: Cardiothoracic Surgery

## 2016-05-01 DIAGNOSIS — R911 Solitary pulmonary nodule: Secondary | ICD-10-CM

## 2016-05-01 MED ORDER — IOPAMIDOL (ISOVUE-300) INJECTION 61%
75.0000 mL | Freq: Once | INTRAVENOUS | Status: AC | PRN
  Administered 2016-05-01: 75 mL via INTRAVENOUS

## 2016-05-04 ENCOUNTER — Encounter (HOSPITAL_COMMUNITY): Payer: Self-pay

## 2016-05-04 ENCOUNTER — Encounter (HOSPITAL_COMMUNITY)
Admission: RE | Admit: 2016-05-04 | Discharge: 2016-05-04 | Disposition: A | Discharge status: Home / Self Care | Payer: Medicare Other | Source: Ambulatory Visit | Attending: Cardiothoracic Surgery | Admitting: Cardiothoracic Surgery

## 2016-05-04 DIAGNOSIS — Z79899 Other long term (current) drug therapy: Secondary | ICD-10-CM | POA: Insufficient documentation

## 2016-05-04 DIAGNOSIS — Z7901 Long term (current) use of anticoagulants: Secondary | ICD-10-CM | POA: Diagnosis not present

## 2016-05-04 DIAGNOSIS — K219 Gastro-esophageal reflux disease without esophagitis: Secondary | ICD-10-CM | POA: Diagnosis not present

## 2016-05-04 DIAGNOSIS — Z86718 Personal history of other venous thrombosis and embolism: Secondary | ICD-10-CM | POA: Insufficient documentation

## 2016-05-04 DIAGNOSIS — Z01812 Encounter for preprocedural laboratory examination: Secondary | ICD-10-CM | POA: Diagnosis not present

## 2016-05-04 DIAGNOSIS — Z87891 Personal history of nicotine dependence: Secondary | ICD-10-CM | POA: Insufficient documentation

## 2016-05-04 DIAGNOSIS — N400 Enlarged prostate without lower urinary tract symptoms: Secondary | ICD-10-CM | POA: Diagnosis not present

## 2016-05-04 DIAGNOSIS — Z85828 Personal history of other malignant neoplasm of skin: Secondary | ICD-10-CM | POA: Diagnosis not present

## 2016-05-04 DIAGNOSIS — Z01818 Encounter for other preprocedural examination: Secondary | ICD-10-CM | POA: Insufficient documentation

## 2016-05-04 DIAGNOSIS — M069 Rheumatoid arthritis, unspecified: Secondary | ICD-10-CM | POA: Diagnosis not present

## 2016-05-04 DIAGNOSIS — Z0183 Encounter for blood typing: Secondary | ICD-10-CM | POA: Insufficient documentation

## 2016-05-04 DIAGNOSIS — Z8673 Personal history of transient ischemic attack (TIA), and cerebral infarction without residual deficits: Secondary | ICD-10-CM | POA: Insufficient documentation

## 2016-05-04 DIAGNOSIS — Z96652 Presence of left artificial knee joint: Secondary | ICD-10-CM | POA: Insufficient documentation

## 2016-05-04 DIAGNOSIS — R911 Solitary pulmonary nodule: Secondary | ICD-10-CM | POA: Diagnosis not present

## 2016-05-04 HISTORY — DX: Cardiac arrhythmia, unspecified: I49.9

## 2016-05-04 HISTORY — DX: Major depressive disorder, single episode, unspecified: F32.9

## 2016-05-04 HISTORY — DX: Depression, unspecified: F32.A

## 2016-05-04 LAB — COMPREHENSIVE METABOLIC PANEL
ALT: 13 U/L — ABNORMAL LOW (ref 17–63)
AST: 20 U/L (ref 15–41)
Albumin: 3.9 g/dL (ref 3.5–5.0)
Alkaline Phosphatase: 59 U/L (ref 38–126)
Anion gap: 6 (ref 5–15)
BUN: 11 mg/dL (ref 6–20)
CO2: 22 mmol/L (ref 22–32)
Calcium: 9.2 mg/dL (ref 8.9–10.3)
Chloride: 107 mmol/L (ref 101–111)
Creatinine, Ser: 0.95 mg/dL (ref 0.61–1.24)
GFR calc Af Amer: >60 mL/min (ref >60)
GFR calc non Af Amer: >60 mL/min (ref >60)
Glucose, Bld: 96 mg/dL (ref 65–99)
Potassium: 4.8 mmol/L (ref 3.5–5.1)
Sodium: 135 mmol/L (ref 135–145)
Total Bilirubin: 0.7 mg/dL (ref 0.3–1.2)
Total Protein: 6.3 g/dL — ABNORMAL LOW (ref 6.5–8.1)

## 2016-05-04 LAB — BLOOD GAS, ARTERIAL
Acid-Base Excess: 1.4 mmol/L (ref 0.0–2.0)
Bicarbonate: 25.4 mEq/L — ABNORMAL HIGH (ref 20.0–24.0)
Drawn by: 206361
FIO2: 0.21
O2 Saturation: 94.4 %
Patient temperature: 98.6
TCO2: 26.6 mmol/L (ref 0–100)
pCO2 arterial: 39.4 mmHg (ref 35.0–45.0)
pH, Arterial: 7.424 (ref 7.350–7.450)
pO2, Arterial: 71.6 mmHg — ABNORMAL LOW (ref 80.0–100.0)

## 2016-05-04 LAB — CBC
HCT: 45.3 % (ref 39.0–52.0)
Hemoglobin: 15.2 g/dL (ref 13.0–17.0)
MCH: 31.7 pg (ref 26.0–34.0)
MCHC: 33.6 g/dL (ref 30.0–36.0)
MCV: 94.4 fL (ref 78.0–100.0)
Platelets: 207 10*3/uL (ref 150–400)
RBC: 4.8 MIL/uL (ref 4.22–5.81)
RDW: 14.3 % (ref 11.5–15.5)
WBC: 7.6 10*3/uL (ref 4.0–10.5)

## 2016-05-04 LAB — URINALYSIS, ROUTINE W REFLEX MICROSCOPIC
Bilirubin Urine: NEGATIVE
Glucose, UA: NEGATIVE mg/dL
Hgb urine dipstick: NEGATIVE
Ketones, ur: NEGATIVE mg/dL
Leukocytes, UA: NEGATIVE
Nitrite: NEGATIVE
Protein, ur: NEGATIVE mg/dL
Specific Gravity, Urine: 1.018 (ref 1.005–1.030)
pH: 6.5 (ref 5.0–8.0)

## 2016-05-04 LAB — SURGICAL PCR SCREEN
MRSA, PCR: NEGATIVE
Staphylococcus aureus: NEGATIVE

## 2016-05-04 LAB — PROTIME-INR
INR: 1.1
Prothrombin Time: 14.2 seconds (ref 11.4–15.2)

## 2016-05-04 LAB — APTT: aPTT: 69 seconds — ABNORMAL HIGH (ref 24–36)

## 2016-05-04 NOTE — Pre-Procedure Instructions (Signed)
Jeffrey Stephens  05/04/2016      [id number] [address]Phone: [phone]: [phone]   Your procedure is scheduled on  Monday  05/08/16  [facility]  Call this number if you have problems the morning of surgery:  [phone]   Remember:  Do not eat food or drink liquids after midnight.  Take these medicines the morning of surgery with A SIP OF WATER  LANSOPRAZOLE (PREVACID), OXYCODONE IF NEEDED, SOTALOL(BETAPACE)       (STOP ELIQUIS AS DIRECTED, NO ASPIRIN OR ASPIRIN PRODUCTS, IBUPROFEN/ ADVIL/ MOTRIN, GOODY POWDERS/ BC'S, HERBAL MEDICINES)   Do not wear jewelry, make-up or nail polish.  Do not wear lotions, powders, or perfumes.  You may wear deoderant.  Do not shave 48 hours prior to surgery.  Men may shave face and neck.  Do not bring valuables to the hospital.  [facility] for any belongings or [facility]uables.  Contacts, dentures or bridgework may not be worn into surgery.  Leave your suitcase in the car.  After surgery it may be brought to your room.  For patients admitted to the hospital, discharge time will be determined by your treatment team.  Patients discharged the day of surgery will not be allowed to drive home.   Name and phone number of your driver:    Special instructions:  [facility]- Preparing for Surgery  Before surgery, you can play an important role.  Because skin is not sterile, your skin needs to be as free of germs as possible.  You can reduce the number of germs on you skin by washing with CHG (chlorahexidine gluconate) soap before surgery.  CHG is an antiseptic cleaner which kills germs and bonds with the skin to continue killing germs even after washing.  Please DO NOT use if you have an allergy to CHG or antibacterial soaps.  If your skin becomes reddened/irritated stop using the  CHG and inform your nurse when you arrive at Short Stay.  Do not shave (including legs and underarms) for at least 48 hours prior to the first CHG shower.  You may shave your face.  Please follow these instructions carefully:   1.  Shower with CHG Soap the night before surgery and the                                morning of Surgery.  2.  If you choose to wash your hair, wash your hair first as usual with your       normal shampoo.  3.  After you shampoo, rinse your hair and body thoroughly to remove the                      Shampoo.  4.  Use CHG as you would any other liquid soap.  You can apply chg directly       to the skin and wash gently with scrungie or a clean washcloth.  5.  Apply the CHG Soap to your body ONLY FROM THE NECK DOWN.        Do not use on open wounds or open sores.  Avoid contact with your eyes,       ears, mouth and genitals (private parts).  Wash  genitals (private parts)       with your normal soap.  6.  Wash thoroughly, paying special attention to the area where your surgery        will be performed.  7.  Thoroughly rinse your body with warm water from the neck down.  8.  DO NOT shower/wash with your normal soap after using and rinsing off       the CHG Soap.  9.  Pat yourself dry with a clean towel.            10.  Wear clean pajamas.            11.  Place clean sheets on your bed the night of your first shower and do not        sleep with pets.  Day of Surgery  Do not apply any lotions/deoderants the morning of surgery.  Please wear clean clothes to the hospital/surgery center.    Please read over the following fact sheets that you were given. Pain Booklet, MRSA Information and Surgical Site Infection Prevention

## 2016-05-05 NOTE — Progress Notes (Signed)
Esthesia chart review: Patient is a 74 year old male scheduled for right VATS, lung resection on 05/08/2016 by Dr. Prescott Gum. DX: Hypermetabolic  RLL lung nodule.  History includes former smoker, paroxysmal afib/aflutter 05/2014, left leg DVT 2001, cerebral aneurysm rupture with CVA/TIA s/p repair (clipping) 1994, palpitations, GERD, BPH, nephrolithiasis, AAA, rheumatoid arthritis, depression, skin cancer ([place]), left TKA 05/14/14.   PCP is Dr. Lavone Orn.  Vascular surgeon is Dr. Scot Dock. Cardiologist is Dr. [name], last visit 04/19/16 with Ignacia Bayley, NP. His note states, "felt to be low risk for cardiac complication and may proceed with surgery without any additional cardiac testing at this time...he may safely come off of eliquis prior to his surgery without the requirement for bridging." Dr. [name] agreed with recommendations.  Meds include Eliquis (instructed to hold 4 days prior to surgery per Dr. Prescott Gum), folic acid, Prevacid, methotrexate, Percocet, sotalol.  BP 125/67   Pulse 62   Temp 36.9 C (Oral)   Resp 18   Ht '5\' 9"'$  (1.753 m)   Wt 233 lb 2 oz (105.7 kg)   SpO2 95%   BMI 34.43 kg/m   04/19/16 EKG: SB at 57 bpm, first degree AVB.  07/08/14 Nuclear stress test: Overall Impression:  Intermediate risk stress nuclear study with evidence of an inferior wall defect that appears to be reversible. Cannot exclude a fixed defect, as there is substantial infradiaphragmatic visceral uptake, especially on the rest images, that limits inferior wall evaluation.. As wall motion is normal in this area, this could actually represent a diaphragmatic attenuation artifact. EF 65%. (By cardiology note, "ultimately, it was felt that this was likely artifact in the setting of attenuation and excessive intestinal activity.")  06/11/14 Echo: Impressions: - The patient was in atrial flutter. Normal LV size and systolic function, EF 88-50%. Normal RV size and systolic function. No significant  valvular abnormalities.  08/25/15 AAA U/S: Impression: Abdominal aortic aneurysm present measuring approximately 3.38 cm AP X 3.58 cm TRV, no intramural thrombus is evident. (Slight increase in aneurysm diameter since previous study 520/15, 3.27 X 3.22 cm.) (3.6 cm AP by 01/26/16 CT.)  04/04/16 Head CT: IMPRESSION: 1. No intracranial metastatic disease. 2. No acute intracranial abnormality. 3. Findings of mild chronic microvascular ischemia.  05/01/16 Chest CT: IMPRESSION: - 2.3 cm central right lower lobe pulmonary nodule, which abuts the mediastinal pleura and inferior right pulmonary vein, without evidence of endovascular or endobronchial invasion. - No evidence of mediastinal or hilar lymphadenopathy by CT criteria. - Advanced atherosclerotic disease of the coronary arteries and aorta.  He is for a CXR on the day of surgery.  04/06/16 PFTs: FVC 4.19 (110%), FEV1 2.21 (80%), DLCOunc 12.33 (43%).  Preoperative labs noted. PTT is 69. CBC, PT/INR WNL. Cr 0.95. PTT results routed to Dr. Prescott Gum and TCTS RN Thurmond Butts. I am ordering a STAT PTT for the day of surgery to re-evaluate. He will have held his Eliquis for additional days by then (prior PTT in 2009 was normal).  If results felt acceptable and otherwise no acute changes then I would anticipate that patient can proceed as planned.  George [facility] Short Stay Center/Anesthesiology Phone [phone] 05/05/2016 11:30 AM

## 2016-05-07 MED ORDER — DEXTROSE 5 % IV SOLN
1.5000 g | INTRAVENOUS | Status: AC
  Administered 2016-05-08: 1.5 g via INTRAVENOUS
  Filled 2016-05-07: qty 1.5

## 2016-05-07 NOTE — Anesthesia Preprocedure Evaluation (Addendum)
Anesthesia Evaluation  Patient identified by MRN, date of birth, ID band Patient awake    Reviewed: Allergy & Precautions, NPO status , Patient's Chart, lab work & pertinent test results  History of Anesthesia Complications Negative for: history of anesthetic complications  Airway Mallampati: III  TM Distance: >3 FB Neck ROM: Full    Dental  (+) Dental Advisory Given   Pulmonary neg shortness of breath, neg sleep apnea, neg COPD, neg recent URI, former smoker,  05/01/16 Chest CT: IMPRESSION: - 2.3 cm central right lower lobe pulmonary nodule, which abuts the mediastinal pleura and inferior right pulmonary vein, without evidence of endovascular or endobronchial invasion. - No evidence of mediastinal or hilar lymphadenopathy by CT criteria. - Advanced atherosclerotic disease of the coronary arteries and aorta.   Pulmonary exam normal breath sounds clear to auscultation       Cardiovascular (-) hypertension(-) angina+ CAD and + Peripheral Vascular Disease  + dysrhythmias (on Eliquis, stopped 4 days prior to surgery; on sotalol) Atrial Fibrillation  Rhythm:Irregular Rate:Normal  04/19/16 EKG: SB at 57 bpm, first degree AVB.  07/08/14 Nuclear stress test: Overall Impression:Intermediate risk stress nuclear study with evidence of an inferior wall defect that appears to be reversible. Cannot exclude a fixed defect, as there is substantial infradiaphragmatic visceral uptake, especially on the rest images, that limits inferior wall evaluation..As wall motion is normal in this area, this could actually represent a diaphragmatic attenuation artifact. EF 65%. (By cardiology note, "ultimately, it was felt that this was likely artifact in the setting of attenuation and excessive intestinal activity.")  06/11/14 Echo: Impressions: - The patient was in atrial flutter. Normal LV size and systolic function, EF 14-97%. Normal RV size and systolic  function. No significant valvular abnormalities.  08/25/15 AAA U/S: Impression: Abdominal aortic aneurysm present measuring approximately 3.38 cm AP X 3.58 cm TRV, no intramural thrombus is evident. (Slight increase in aneurysm diameter since previous study 520/15, 3.27 X 3.22 cm.) (3.6 cm AP by 01/26/16 CT.)   Neuro/Psych neg Seizures PSYCHIATRIC DISORDERS Depression CVA (1994, related to cerebral aneurysm)    GI/Hepatic GERD  ,  Endo/Other    Renal/GU Renal disease (h/o nephrolithiasis)   BPH    Musculoskeletal  (+) Arthritis , Rheumatoid disorders,    Abdominal (+) + obese,   Peds  Hematology  (+) Blood dyscrasia (h/o LLE DVT in 2001), ,   Anesthesia Other Findings   Reproductive/Obstetrics                           Anesthesia Physical Anesthesia Plan  ASA: III  Anesthesia Plan: General   Post-op Pain Management:    Induction: Intravenous  Airway Management Planned: Double Lumen EBT  Additional Equipment: Arterial line, Ultrasound Guidance Line Placement and CVP  Intra-op Plan:   Post-operative Plan: Extubation in OR  Informed Consent: I have reviewed the patients History and Physical, chart, labs and discussed the procedure including the risks, benefits and alternatives for the proposed anesthesia with the patient or authorized representative who has indicated his/her understanding and acceptance.   Dental advisory given  Plan Discussed with:   Anesthesia Plan Comments:       Anesthesia Quick Evaluation

## 2016-05-08 ENCOUNTER — Inpatient Hospital Stay (HOSPITAL_COMMUNITY): Payer: Medicare Other | Admitting: Vascular Surgery

## 2016-05-08 ENCOUNTER — Encounter (HOSPITAL_COMMUNITY): Payer: Self-pay | Admitting: Urology

## 2016-05-08 ENCOUNTER — Inpatient Hospital Stay (HOSPITAL_COMMUNITY): Payer: Medicare Other

## 2016-05-08 ENCOUNTER — Encounter (HOSPITAL_COMMUNITY)
Admission: RE | Disposition: A | Discharge status: Home Health | Payer: Self-pay | Source: Ambulatory Visit | Attending: Cardiothoracic Surgery

## 2016-05-08 ENCOUNTER — Inpatient Hospital Stay (HOSPITAL_COMMUNITY)
Admission: RE | Admit: 2016-05-08 | Discharge: 2016-05-15 | DRG: 164 | Disposition: A | Discharge status: Home Health | Payer: Medicare Other | Source: Ambulatory Visit | Attending: Cardiothoracic Surgery | Admitting: Cardiothoracic Surgery

## 2016-05-08 DIAGNOSIS — Z96652 Presence of left artificial knee joint: Secondary | ICD-10-CM | POA: Diagnosis present

## 2016-05-08 DIAGNOSIS — I2510 Atherosclerotic heart disease of native coronary artery without angina pectoris: Secondary | ICD-10-CM | POA: Diagnosis present

## 2016-05-08 DIAGNOSIS — C3430 Malignant neoplasm of lower lobe, unspecified bronchus or lung: Secondary | ICD-10-CM | POA: Diagnosis present

## 2016-05-08 DIAGNOSIS — Z86718 Personal history of other venous thrombosis and embolism: Secondary | ICD-10-CM

## 2016-05-08 DIAGNOSIS — N400 Enlarged prostate without lower urinary tract symptoms: Secondary | ICD-10-CM | POA: Diagnosis present

## 2016-05-08 DIAGNOSIS — J449 Chronic obstructive pulmonary disease, unspecified: Secondary | ICD-10-CM | POA: Diagnosis present

## 2016-05-08 DIAGNOSIS — M069 Rheumatoid arthritis, unspecified: Secondary | ICD-10-CM | POA: Diagnosis present

## 2016-05-08 DIAGNOSIS — Z85828 Personal history of other malignant neoplasm of skin: Secondary | ICD-10-CM

## 2016-05-08 DIAGNOSIS — C3431 Malignant neoplasm of lower lobe, right bronchus or lung: Secondary | ICD-10-CM | POA: Diagnosis present

## 2016-05-08 DIAGNOSIS — Z8673 Personal history of transient ischemic attack (TIA), and cerebral infarction without residual deficits: Secondary | ICD-10-CM

## 2016-05-08 DIAGNOSIS — R911 Solitary pulmonary nodule: Secondary | ICD-10-CM

## 2016-05-08 DIAGNOSIS — I714 Abdominal aortic aneurysm, without rupture: Secondary | ICD-10-CM | POA: Diagnosis present

## 2016-05-08 DIAGNOSIS — Z79899 Other long term (current) drug therapy: Secondary | ICD-10-CM | POA: Diagnosis not present

## 2016-05-08 DIAGNOSIS — Z419 Encounter for procedure for purposes other than remedying health state, unspecified: Secondary | ICD-10-CM

## 2016-05-08 DIAGNOSIS — Z9689 Presence of other specified functional implants: Secondary | ICD-10-CM

## 2016-05-08 DIAGNOSIS — J939 Pneumothorax, unspecified: Secondary | ICD-10-CM | POA: Diagnosis not present

## 2016-05-08 DIAGNOSIS — I482 Chronic atrial fibrillation: Secondary | ICD-10-CM | POA: Diagnosis present

## 2016-05-08 DIAGNOSIS — R0602 Shortness of breath: Secondary | ICD-10-CM

## 2016-05-08 DIAGNOSIS — Z87891 Personal history of nicotine dependence: Secondary | ICD-10-CM | POA: Diagnosis not present

## 2016-05-08 DIAGNOSIS — K219 Gastro-esophageal reflux disease without esophagitis: Secondary | ICD-10-CM | POA: Diagnosis present

## 2016-05-08 DIAGNOSIS — Z7901 Long term (current) use of anticoagulants: Secondary | ICD-10-CM | POA: Diagnosis not present

## 2016-05-08 DIAGNOSIS — R222 Localized swelling, mass and lump, trunk: Secondary | ICD-10-CM

## 2016-05-08 DIAGNOSIS — J9811 Atelectasis: Secondary | ICD-10-CM | POA: Diagnosis not present

## 2016-05-08 DIAGNOSIS — J95811 Postprocedural pneumothorax: Secondary | ICD-10-CM | POA: Diagnosis not present

## 2016-05-08 DIAGNOSIS — Z09 Encounter for follow-up examination after completed treatment for conditions other than malignant neoplasm: Secondary | ICD-10-CM

## 2016-05-08 DIAGNOSIS — Z87442 Personal history of urinary calculi: Secondary | ICD-10-CM

## 2016-05-08 HISTORY — PX: VIDEO ASSISTED THORACOSCOPY (VATS)/WEDGE RESECTION: SHX6174

## 2016-05-08 LAB — GLUCOSE, CAPILLARY
Glucose-Capillary: 155 mg/dL — ABNORMAL HIGH (ref 65–99)
Glucose-Capillary: 112 mg/dL — ABNORMAL HIGH (ref 65–99)
Glucose-Capillary: 145 mg/dL — ABNORMAL HIGH (ref 65–99)

## 2016-05-08 LAB — POCT I-STAT 3, ART BLOOD GAS (G3+)
Acid-base deficit: 2 mmol/L (ref 0.0–2.0)
Bicarbonate: 24.3 mEq/L — ABNORMAL HIGH (ref 20.0–24.0)
O2 Saturation: 95 %
TCO2: 26 mmol/L (ref 0–100)
pCO2 arterial: 48.1 mmHg — ABNORMAL HIGH (ref 35.0–45.0)
pH, Arterial: 7.312 — ABNORMAL LOW (ref 7.350–7.450)
pO2, Arterial: 84 mmHg (ref 80.0–100.0)

## 2016-05-08 LAB — BLOOD GAS, ARTERIAL
Acid-base deficit: 1.3 mmol/L (ref 0.0–2.0)
Bicarbonate: 24.5 mEq/L — ABNORMAL HIGH (ref 20.0–24.0)
Drawn by: 417081
FIO2: 21
O2 Content: 10 L/min
O2 Saturation: 95.9 %
Patient temperature: 98.6
TCO2: 26.2 mmol/L (ref 0–100)
pCO2 arterial: 53.9 mmHg — ABNORMAL HIGH (ref 35.0–45.0)
pH, Arterial: 7.28 — ABNORMAL LOW (ref 7.350–7.450)
pO2, Arterial: 94.3 mmHg (ref 80.0–100.0)

## 2016-05-08 LAB — PREPARE RBC (CROSSMATCH)

## 2016-05-08 LAB — APTT: APTT: 34 s (ref 24–36)

## 2016-05-08 SURGERY — VIDEO ASSISTED THORACOSCOPY (VATS)/WEDGE RESECTION
Anesthesia: General | Site: Chest | Laterality: Right

## 2016-05-08 MED ORDER — METOPROLOL TARTRATE 5 MG/5ML IV SOLN
INTRAVENOUS | Status: AC
  Filled 2016-05-08: qty 5

## 2016-05-08 MED ORDER — FENTANYL CITRATE (PF) 100 MCG/2ML IJ SOLN
25.0000 ug | INTRAMUSCULAR | Status: DC | PRN
  Administered 2016-05-08 (×3): 50 ug via INTRAVENOUS

## 2016-05-08 MED ORDER — BUPIVACAINE HCL (PF) 0.5 % IJ SOLN
INTRAMUSCULAR | Status: DC | PRN
  Administered 2016-05-08: 5 mL

## 2016-05-08 MED ORDER — SOTALOL HCL 80 MG PO TABS
80.0000 mg | ORAL_TABLET | Freq: Two times a day (BID) | ORAL | Status: DC
  Administered 2016-05-08: 80 mg via ORAL
  Filled 2016-05-08 (×2): qty 1

## 2016-05-08 MED ORDER — BUPIVACAINE 0.5 % ON-Q PUMP SINGLE CATH 400 ML
400.0000 mL | INJECTION | Status: AC
  Administered 2016-05-08: 400 mL
  Filled 2016-05-08: qty 400

## 2016-05-08 MED ORDER — PANTOPRAZOLE SODIUM 40 MG PO TBEC
40.0000 mg | DELAYED_RELEASE_TABLET | Freq: Every day | ORAL | Status: DC
Start: 2016-05-08 — End: 2016-05-15
  Administered 2016-05-08 – 2016-05-15 (×8): 40 mg via ORAL
  Filled 2016-05-08 (×8): qty 1

## 2016-05-08 MED ORDER — ACETAMINOPHEN 160 MG/5ML PO SOLN: 1000.0000 mg | Freq: Four times a day (QID) | ORAL | Status: AC

## 2016-05-08 MED ORDER — GLYCOPYRROLATE 0.2 MG/ML IJ SOLN
INTRAMUSCULAR | Status: DC | PRN
  Administered 2016-05-08: .2 mg via INTRAVENOUS

## 2016-05-08 MED ORDER — PROPOFOL 10 MG/ML IV BOLUS
INTRAVENOUS | Status: AC
  Filled 2016-05-08: qty 20

## 2016-05-08 MED ORDER — LEVALBUTEROL HCL 0.63 MG/3ML IN NEBU
0.6300 mg | INHALATION_SOLUTION | Freq: Four times a day (QID) | RESPIRATORY_TRACT | Status: DC
  Administered 2016-05-08 – 2016-05-09 (×4): 0.63 mg via RESPIRATORY_TRACT
  Filled 2016-05-08 (×4): qty 3

## 2016-05-08 MED ORDER — LACTATED RINGERS IV SOLN
INTRAVENOUS | Status: DC | PRN
  Administered 2016-05-08: 07:00:00 via INTRAVENOUS

## 2016-05-08 MED ORDER — ADULT MULTIVITAMIN W/MINERALS CH
1.0000 | ORAL_TABLET | Freq: Every day | ORAL | Status: DC
  Administered 2016-05-09 – 2016-05-15 (×7): 1 via ORAL
  Filled 2016-05-08 (×7): qty 1

## 2016-05-08 MED ORDER — SUGAMMADEX SODIUM 200 MG/2ML IV SOLN
INTRAVENOUS | Status: AC
  Filled 2016-05-08: qty 2

## 2016-05-08 MED ORDER — POTASSIUM CHLORIDE 10 MEQ/50ML IV SOLN: 10.0000 meq | Freq: Every day | INTRAVENOUS | Status: DC | PRN

## 2016-05-08 MED ORDER — DOCUSATE SODIUM 100 MG PO CAPS
200.0000 mg | ORAL_CAPSULE | Freq: Two times a day (BID) | ORAL | Status: DC
  Administered 2016-05-08 – 2016-05-15 (×14): 200 mg via ORAL
  Filled 2016-05-08 (×13): qty 2

## 2016-05-08 MED ORDER — ONDANSETRON HCL 4 MG/2ML IJ SOLN
4.0000 mg | Freq: Four times a day (QID) | INTRAMUSCULAR | Status: DC | PRN
  Administered 2016-05-08 – 2016-05-10 (×2): 4 mg via INTRAVENOUS
  Filled 2016-05-08 (×3): qty 2

## 2016-05-08 MED ORDER — TRAMADOL HCL 50 MG PO TABS
50.0000 mg | ORAL_TABLET | Freq: Four times a day (QID) | ORAL | Status: DC | PRN
  Administered 2016-05-10 – 2016-05-13 (×2): 50 mg via ORAL
  Administered 2016-05-14: 100 mg via ORAL
  Filled 2016-05-08: qty 2
  Filled 2016-05-08 (×2): qty 1

## 2016-05-08 MED ORDER — PROPOFOL 10 MG/ML IV BOLUS
INTRAVENOUS | Status: DC | PRN
  Administered 2016-05-08: 120 mg via INTRAVENOUS

## 2016-05-08 MED ORDER — FENTANYL CITRATE (PF) 100 MCG/2ML IJ SOLN
INTRAMUSCULAR | Status: DC | PRN
  Administered 2016-05-08 (×14): 50 ug via INTRAVENOUS

## 2016-05-08 MED ORDER — EPHEDRINE SULFATE 50 MG/ML IJ SOLN
INTRAMUSCULAR | Status: DC | PRN
  Administered 2016-05-08 (×2): 10 mg via INTRAVENOUS

## 2016-05-08 MED ORDER — KCL IN DEXTROSE-NACL 10-5-0.45 MEQ/L-%-% IV SOLN
INTRAVENOUS | Status: DC
  Administered 2016-05-08 – 2016-05-09 (×2): via INTRAVENOUS
  Administered 2016-05-10: 30 mL/h via INTRAVENOUS
  Filled 2016-05-08 (×4): qty 1000

## 2016-05-08 MED ORDER — ONDANSETRON HCL 4 MG/2ML IJ SOLN: 4.0000 mg | Freq: Once | INTRAMUSCULAR | Status: DC | PRN

## 2016-05-08 MED ORDER — FENTANYL CITRATE (PF) 100 MCG/2ML IJ SOLN
25.0000 ug | INTRAMUSCULAR | Status: DC | PRN
  Administered 2016-05-08: 25 ug via INTRAVENOUS
  Administered 2016-05-08 – 2016-05-09 (×3): 50 ug via INTRAVENOUS
  Administered 2016-05-10: 25 ug via INTRAVENOUS
  Filled 2016-05-08 (×6): qty 2

## 2016-05-08 MED ORDER — 0.9 % SODIUM CHLORIDE (POUR BTL) OPTIME
TOPICAL | Status: DC | PRN
  Administered 2016-05-08: 3000 mL

## 2016-05-08 MED ORDER — ROCURONIUM BROMIDE 100 MG/10ML IV SOLN
INTRAVENOUS | Status: DC | PRN
  Administered 2016-05-08 (×2): 40 mg via INTRAVENOUS
  Administered 2016-05-08: 20 mg via INTRAVENOUS

## 2016-05-08 MED ORDER — ACETAMINOPHEN 500 MG PO TABS
1000.0000 mg | ORAL_TABLET | Freq: Four times a day (QID) | ORAL | Status: AC
  Administered 2016-05-08 – 2016-05-13 (×15): 1000 mg via ORAL
  Filled 2016-05-08 (×16): qty 2

## 2016-05-08 MED ORDER — ONDANSETRON HCL 4 MG/2ML IJ SOLN
INTRAMUSCULAR | Status: AC
Start: 2016-05-08 — End: 2016-05-08
  Filled 2016-05-08: qty 2

## 2016-05-08 MED ORDER — BUPIVACAINE ON-Q PAIN PUMP (FOR ORDER SET NO CHG)
INJECTION | Status: DC
  Filled 2016-05-08: qty 1

## 2016-05-08 MED ORDER — SUGAMMADEX SODIUM 200 MG/2ML IV SOLN
INTRAVENOUS | Status: DC | PRN
  Administered 2016-05-08: 200 mg via INTRAVENOUS

## 2016-05-08 MED ORDER — LIDOCAINE HCL (CARDIAC) 20 MG/ML IV SOLN
INTRAVENOUS | Status: DC | PRN
  Administered 2016-05-08: 100 mg via INTRAVENOUS

## 2016-05-08 MED ORDER — ESMOLOL HCL 100 MG/10ML IV SOLN
INTRAVENOUS | Status: DC | PRN
  Administered 2016-05-08: 10 mg via INTRAVENOUS
  Administered 2016-05-08 (×2): 20 mg via INTRAVENOUS
  Administered 2016-05-08 (×2): 10 mg via INTRAVENOUS

## 2016-05-08 MED ORDER — FENTANYL CITRATE (PF) 100 MCG/2ML IJ SOLN
INTRAMUSCULAR | Status: AC
  Filled 2016-05-08: qty 2

## 2016-05-08 MED ORDER — DEXTROSE 5 % IV SOLN
1.5000 g | Freq: Two times a day (BID) | INTRAVENOUS | Status: AC
  Administered 2016-05-08 – 2016-05-09 (×2): 1.5 g via INTRAVENOUS
  Filled 2016-05-08 (×2): qty 1.5

## 2016-05-08 MED ORDER — MIDAZOLAM HCL 5 MG/5ML IJ SOLN
INTRAMUSCULAR | Status: DC | PRN
  Administered 2016-05-08 (×2): 1 mg via INTRAVENOUS

## 2016-05-08 MED ORDER — VANCOMYCIN HCL IN DEXTROSE 1-5 GM/200ML-% IV SOLN
1000.0000 mg | Freq: Two times a day (BID) | INTRAVENOUS | Status: AC
  Administered 2016-05-08: 1000 mg via INTRAVENOUS
  Filled 2016-05-08: qty 200

## 2016-05-08 MED ORDER — LIDOCAINE 2% (20 MG/ML) 5 ML SYRINGE
INTRAMUSCULAR | Status: AC
  Filled 2016-05-08: qty 5

## 2016-05-08 MED ORDER — MIDAZOLAM HCL 2 MG/2ML IJ SOLN
INTRAMUSCULAR | Status: AC
  Filled 2016-05-08: qty 2

## 2016-05-08 MED ORDER — LABETALOL HCL 5 MG/ML IV SOLN
INTRAVENOUS | Status: DC | PRN
  Administered 2016-05-08 (×4): 5 mg via INTRAVENOUS

## 2016-05-08 MED ORDER — BISACODYL 5 MG PO TBEC
10.0000 mg | DELAYED_RELEASE_TABLET | Freq: Every day | ORAL | Status: DC
  Administered 2016-05-09 – 2016-05-15 (×7): 10 mg via ORAL
  Filled 2016-05-08 (×7): qty 2

## 2016-05-08 MED ORDER — FENTANYL CITRATE (PF) 250 MCG/5ML IJ SOLN
INTRAMUSCULAR | Status: AC
  Filled 2016-05-08: qty 5

## 2016-05-08 MED ORDER — KETOROLAC TROMETHAMINE 15 MG/ML IJ SOLN
15.0000 mg | Freq: Four times a day (QID) | INTRAMUSCULAR | Status: AC
  Administered 2016-05-08 (×2): 15 mg via INTRAVENOUS
  Filled 2016-05-08 (×2): qty 1

## 2016-05-08 MED ORDER — INSULIN ASPART 100 UNIT/ML ~~LOC~~ SOLN
0.0000 [IU] | Freq: Three times a day (TID) | SUBCUTANEOUS | Status: DC
  Administered 2016-05-08: 2 [IU] via SUBCUTANEOUS

## 2016-05-08 MED ORDER — ESMOLOL HCL 100 MG/10ML IV SOLN
INTRAVENOUS | Status: AC
  Filled 2016-05-08: qty 10

## 2016-05-08 MED ORDER — INSULIN ASPART 100 UNIT/ML ~~LOC~~ SOLN: 0.0000 [IU] | Freq: Every day | SUBCUTANEOUS | Status: DC

## 2016-05-08 MED ORDER — BUPIVACAINE HCL (PF) 0.5 % IJ SOLN
INTRAMUSCULAR | Status: AC
  Filled 2016-05-08: qty 10

## 2016-05-08 MED ORDER — ONDANSETRON HCL 4 MG/2ML IJ SOLN
INTRAMUSCULAR | Status: DC | PRN
  Administered 2016-05-08: 4 mg via INTRAVENOUS

## 2016-05-08 MED ORDER — METOCLOPRAMIDE HCL 5 MG/ML IJ SOLN
10.0000 mg | Freq: Four times a day (QID) | INTRAMUSCULAR | Status: AC
  Administered 2016-05-08 – 2016-05-13 (×17): 10 mg via INTRAVENOUS
  Filled 2016-05-08 (×19): qty 2

## 2016-05-08 MED ORDER — LACTATED RINGERS IV SOLN
INTRAVENOUS | Status: DC | PRN
  Administered 2016-05-08 (×2): via INTRAVENOUS

## 2016-05-08 MED ORDER — OXYCODONE HCL 5 MG PO TABS
5.0000 mg | ORAL_TABLET | ORAL | Status: DC | PRN
  Administered 2016-05-09: 5 mg via ORAL
  Administered 2016-05-09 – 2016-05-15 (×3): 10 mg via ORAL
  Filled 2016-05-08 (×3): qty 2
  Filled 2016-05-08: qty 1

## 2016-05-08 MED ORDER — FOLIC ACID 1 MG PO TABS
1.0000 mg | ORAL_TABLET | Freq: Every morning | ORAL | Status: DC
  Administered 2016-05-08 – 2016-05-15 (×7): 1 mg via ORAL
  Filled 2016-05-08 (×7): qty 1

## 2016-05-08 MED ORDER — ROCURONIUM BROMIDE 10 MG/ML (PF) SYRINGE
PREFILLED_SYRINGE | INTRAVENOUS | Status: AC
  Filled 2016-05-08: qty 10

## 2016-05-08 SURGICAL SUPPLY — 75 items
APPLICATOR TIP COSEAL (VASCULAR PRODUCTS) IMPLANT
APPLICATOR TIP EXT COSEAL (VASCULAR PRODUCTS) IMPLANT
APPLIER CLIP 5 13 M/L LIGAMAX5 (MISCELLANEOUS) ×3
CANISTER SUCTION 2500CC (MISCELLANEOUS) ×3 IMPLANT
CATH KIT ON Q 5IN SLV (PAIN MANAGEMENT) IMPLANT
CATH KIT ON-Q SILVERSOAK 5IN (CATHETERS) ×3 IMPLANT
CATH THORACIC 28FR (CATHETERS) ×3 IMPLANT
CATH THORACIC 36FR (CATHETERS) IMPLANT
CATH THORACIC 36FR RT ANG (CATHETERS) ×3 IMPLANT
CLIP APPLIE 5 13 M/L LIGAMAX5 (MISCELLANEOUS) ×1 IMPLANT
CLIP TI MEDIUM 24 (CLIP) ×3 IMPLANT
CLIP TI MEDIUM 6 (CLIP) IMPLANT
CONN ST 1/4X3/8  BEN (MISCELLANEOUS) ×2
CONN ST 1/4X3/8 BEN (MISCELLANEOUS) ×1 IMPLANT
CONT SPEC 4OZ CLIKSEAL STRL BL (MISCELLANEOUS) ×9 IMPLANT
DERMABOND ADVANCED (GAUZE/BANDAGES/DRESSINGS) ×2
DERMABOND ADVANCED .7 DNX12 (GAUZE/BANDAGES/DRESSINGS) ×1 IMPLANT
DRAPE LAPAROSCOPIC ABDOMINAL (DRAPES) ×3 IMPLANT
DRAPE WARM FLUID 44X44 (DRAPE) ×3 IMPLANT
ELECT BLADE 4.0 EZ CLEAN MEGAD (MISCELLANEOUS) ×3
ELECT BLADE 6.5 EXT (BLADE) ×3 IMPLANT
ELECT REM PT RETURN 9FT ADLT (ELECTROSURGICAL) ×3
ELECTRODE BLDE 4.0 EZ CLN MEGD (MISCELLANEOUS) ×1 IMPLANT
ELECTRODE REM PT RTRN 9FT ADLT (ELECTROSURGICAL) ×1 IMPLANT
GAUZE SPONGE 4X4 12PLY STRL (GAUZE/BANDAGES/DRESSINGS) ×3 IMPLANT
GOWN STRL REUS W/ TWL LRG LVL3 (GOWN DISPOSABLE) ×4 IMPLANT
GOWN STRL REUS W/TWL LRG LVL3 (GOWN DISPOSABLE) ×8
KIT BASIN OR (CUSTOM PROCEDURE TRAY) ×3 IMPLANT
KIT ROOM TURNOVER OR (KITS) ×3 IMPLANT
KIT SUCTION CATH 14FR (SUCTIONS) ×6 IMPLANT
LOOP VESSEL MAXI BLUE (MISCELLANEOUS) ×3 IMPLANT
NS IRRIG 1000ML POUR BTL (IV SOLUTION) ×9 IMPLANT
PACK CHEST (CUSTOM PROCEDURE TRAY) ×3 IMPLANT
PAD ARMBOARD 7.5X6 YLW CONV (MISCELLANEOUS) ×6 IMPLANT
RELOAD GOLD ECHELON 45 (STAPLE) ×18 IMPLANT
SEALANT PROGEL (MISCELLANEOUS) IMPLANT
SEALANT SURG COSEAL 4ML (VASCULAR PRODUCTS) IMPLANT
SEALANT SURG COSEAL 8ML (VASCULAR PRODUCTS) ×3 IMPLANT
SOLUTION ANTI FOG 6CC (MISCELLANEOUS) ×3 IMPLANT
SPONGE INTESTINAL PEANUT (DISPOSABLE) ×3 IMPLANT
SPONGE TONSIL 1 RF SGL (DISPOSABLE) ×9 IMPLANT
STAPLE RELOAD 2.5MM WHITE (STAPLE) ×3 IMPLANT
STAPLER ECHELON FLEX (STAPLE) ×3 IMPLANT
SUT PROLENE 3 0 SH DA (SUTURE) IMPLANT
SUT PROLENE 4 0 RB 1 (SUTURE)
SUT PROLENE 4 0 SH DA (SUTURE) ×6 IMPLANT
SUT PROLENE 4-0 RB1 .5 CRCL 36 (SUTURE) IMPLANT
SUT PROLENE 6 0 C 1 30 (SUTURE) IMPLANT
SUT SILK  1 MH (SUTURE) ×4
SUT SILK 1 MH (SUTURE) ×2 IMPLANT
SUT SILK 1 TIES 10X30 (SUTURE) IMPLANT
SUT SILK 2 0SH CR/8 30 (SUTURE) ×3 IMPLANT
SUT SILK 3 0SH CR/8 30 (SUTURE) IMPLANT
SUT VIC AB 1 CTX 18 (SUTURE) ×6 IMPLANT
SUT VIC AB 1 CTX 36 (SUTURE)
SUT VIC AB 1 CTX36XBRD ANBCTR (SUTURE) IMPLANT
SUT VIC AB 2-0 CT2 18 VCP726D (SUTURE) IMPLANT
SUT VIC AB 2-0 CTX 36 (SUTURE) ×6 IMPLANT
SUT VIC AB 2-0 UR6 27 (SUTURE) IMPLANT
SUT VIC AB 3-0 SH 18 (SUTURE) IMPLANT
SUT VIC AB 3-0 SH 8-18 (SUTURE) IMPLANT
SUT VIC AB 3-0 X1 27 (SUTURE) ×6 IMPLANT
SUT VICRYL 2 TP 1 (SUTURE) ×3 IMPLANT
SWAB COLLECTION DEVICE MRSA (MISCELLANEOUS) IMPLANT
SYSTEM SAHARA CHEST DRAIN ATS (WOUND CARE) ×3 IMPLANT
SYSTEM SAHARA CHEST DRAIN RE-I (WOUND CARE) ×3 IMPLANT
TAPE CLOTH SURG 4X10 WHT LF (GAUZE/BANDAGES/DRESSINGS) ×3 IMPLANT
TIP APPLICATOR SPRAY EXTEND 16 (VASCULAR PRODUCTS) IMPLANT
TOWEL OR 17X24 6PK STRL BLUE (TOWEL DISPOSABLE) ×3 IMPLANT
TOWEL OR 17X26 10 PK STRL BLUE (TOWEL DISPOSABLE) ×6 IMPLANT
TRAP SPECIMEN MUCOUS 40CC (MISCELLANEOUS) IMPLANT
TRAY FOLEY CATH 16FRSI W/METER (SET/KITS/TRAYS/PACK) ×3 IMPLANT
TUBE ANAEROBIC SPECIMEN COL (MISCELLANEOUS) IMPLANT
TUNNELER SHEATH ON-Q 11GX8 DSP (PAIN MANAGEMENT) ×3 IMPLANT
WATER STERILE IRR 1000ML POUR (IV SOLUTION) ×6 IMPLANT

## 2016-05-08 NOTE — Anesthesia Procedure Notes (Signed)
Central Venous Catheter Insertion Performed by: anesthesiologist Patient location: Pre-op. Preanesthetic checklist: patient identified, IV checked, site marked, risks and benefits discussed, surgical consent, monitors and equipment checked, pre-op evaluation, timeout performed and anesthesia consent Lidocaine 1% used for infiltration Landmarks identified Catheter size: 8 Fr Central line was placed.Double lumen Procedure performed using ultrasound guided technique. Attempts: 2 Following insertion, dressing applied, line sutured and Biopatch. Post procedure assessment: blood return through all ports. Patient tolerated the procedure well with no immediate complications.

## 2016-05-08 NOTE — Transfer of Care (Signed)
Immediate Anesthesia Transfer of Care Note  Patient: Jeffrey Stephens  Procedure(s) Performed: Procedure(s): VIDEO ASSISTED THORACOSCOPY (VATS)/LUNG RESECTION (Right)  Patient Location: PACU  Anesthesia Type:General  Level of Consciousness: awake, alert  and oriented  Airway & Oxygen Therapy: Patient Spontanous Breathing and Patient connected to face mask oxygen  Post-op Assessment: Report given to RN, Post -op Vital signs reviewed and stable and Patient moving all extremities X 4  Post vital signs: Reviewed and stable  Last Vitals:  Vitals:   05/08/16 0620  BP: 139/66  Pulse: 63  Resp: 18  Temp: 36.7 C    Last Pain:  Vitals:   05/08/16 0620  TempSrc: Oral         Complications: No apparent anesthesia complications

## 2016-05-08 NOTE — Anesthesia Postprocedure Evaluation (Signed)
Anesthesia Post Note  Patient: Jeffrey Stephens  Procedure(s) Performed: Procedure(s) (LRB): VIDEO ASSISTED THORACOSCOPY (VATS)/LUNG RESECTION (Right)  Patient location during evaluation: PACU Anesthesia Type: General Level of consciousness: awake and alert Pain management: pain level controlled Vital Signs Assessment: post-procedure vital signs reviewed and stable Respiratory status: spontaneous breathing, nonlabored ventilation, respiratory function stable and patient connected to nasal cannula oxygen Cardiovascular status: blood pressure returned to baseline and stable Postop Assessment: no signs of nausea or vomiting Anesthetic complications: no    Last Vitals:  Vitals:   05/08/16 1214 05/08/16 1225  BP: (!) 144/75 131/78  Pulse:  61  Resp: 12 12  Temp:      Last Pain:  Vitals:   05/08/16 0620  TempSrc: Oral                 Nilda Simmer

## 2016-05-08 NOTE — Anesthesia Procedure Notes (Signed)
Procedure Name: Intubation Date/Time: 05/08/2016 7:54 AM Performed by: Salli Quarry Leovardo Thoman Pre-anesthesia Checklist: Patient identified, Emergency Drugs available, Suction available and Patient being monitored Patient Re-evaluated:Patient Re-evaluated prior to inductionOxygen Delivery Method: Circle System Utilized Preoxygenation: Pre-oxygenation with 100% oxygen Intubation Type: IV induction Ventilation: Mask ventilation without difficulty and Oral airway inserted - appropriate to patient size Laryngoscope Size: Mac and 3 Grade View: Grade I Tube type: Oral Endobronchial tube: Left, Double lumen EBT and EBT position confirmed by auscultation and 39 Fr Number of attempts: 1 Airway Equipment and Method: Stylet and Oral airway Placement Confirmation: ETT inserted through vocal cords under direct vision,  positive ETCO2 and breath sounds checked- equal and bilateral Secured at: 31 cm Tube secured with: Tape Dental Injury: Teeth and Oropharynx as per pre-operative assessment

## 2016-05-08 NOTE — Progress Notes (Signed)
      [address]       [place] [address]             [phone]       S/p RLL wedge resection  BP 116/72   Pulse (!) 57   Temp 97.7 F (36.5 C)   Resp 14   Wt 233 lb (105.7 kg)   SpO2 100%   BMI 34.41 kg/m    Intake/Output Summary (Last 24 hours) at 05/08/16 1759 Last data filed at 05/08/16 1500  Gross per 24 hour  Intake           1412.5 ml  Output              815 ml  Net            597.5 ml    Mildly bradycardic while asleep  Remo Lipps C. Roxan Hockey, MD [facility] [phone]

## 2016-05-08 NOTE — Brief Op Note (Signed)
05/08/2016  10:52 AM  PATIENT:  Jeffrey Stephens  75 y.o. male  PRE-OPERATIVE DIAGNOSIS:  RLL NODULE  POST-OPERATIVE DIAGNOSIS:  Right lower lobe nodule Squamous cell cancer PROCEDURE:  Procedure(s): VIDEO ASSISTED THORACOSCOPY (VATS)/LUNG RESECTION (Right)  wedge resection RLL nodule Mediastinal node dissection R-9, R - 4 nodes On - Q analgesia irrigation system  SURGEON:  Surgeon(s) and Role:    * Ivin Poot, MD - Primary  PHYSICIAN ASSISTANT: R Villerreal. A.  ASSISTANTS: none   ANESTHESIA:   general  EBL:  Total I/O In: 1300 [I.V.:1300] Out: 525 [Urine:325; Blood:200]  BLOOD ADMINISTERED:none  DRAINS: 2 R side chest tubes- 28 F anterior, 36 F posterior   LOCAL MEDICATIONS USED:  NONE  SPECIMEN:  Excision  DISPOSITION OF SPECIMEN:  PATHOLOGY  COUNTS:  YES  TOURNIQUET:  * No tourniquets in log *  DICTATION: .Dragon Dictation  PLAN OF CARE: Admit to inpatient   PATIENT DISPOSITION:  PACU - hemodynamically stable.   Delay start of Pharmacological VTE agent (>24hrs) due to surgical blood loss or risk of bleeding: yes

## 2016-05-08 NOTE — Progress Notes (Signed)
Chief Complaint  Patient presents with  . Lung Lesion      Surgical eval, PET Scan 03/15/16, CT ABD 01/26/16, LUNG NODULES HAVE BEEN FOLLOWED FOR YEARS    Patient examined, recent CT scans and PET scan personally reviewed and counseled with patient   HPI: Patient presents for evaluation and therapy of a 2 cm pulmonary nodule in the right lower lobe medial aspect close to the inferior pulmonary vein. This has increased in size from 1 cm from CT scan in 2014. Patient is asymptomatic. The abdominal CT scans were performed because of abdominal complaints and to follow a 3.8 cm AAA which is followed by [facility] Dr. Scot Dock. The patient stopped smoking 1994. He denies shortness of breath with exertion, productive cough, hemoptysis, chest pain or bone pain. The patient is status post craniotomy 1994 for a cerebral aneurysm that has no recent new neurologic symptoms.   PET scan shows the nodule to have intense hypermetabolic activity with SUV of 23.1. There is no abnormal activity in the mediastinal nodes or other areas of the chest or abdomen. CT scan of brain is pending   The patient has cardiac history of atrial fibrillation-flutter followed by Dr. [name] is on chronic Elliquis-no history of stroke or mini stroke. A perfusion myocardial scan was performed in 2015 which was low risk for ischemia--the patient had calcified coronary arteries.   The patient has rheumatoid arthritis and takes methotrexate weekly for 25 years with good relief. The patient's rheumatoid arthritis limits his mobility and his usual walking interval is proximally 5 minutes.      Past Medical History  Diagnosis Date  . TIA (transient ischemic attack)        [place]  . DVT (deep venous thrombosis) ([place]) 2001      Hx of Left leg   . History of palpitations        OCCASIONAL  . GERD (gastroesophageal reflux disease)    . Constipation    . BPH (benign prostatic hyperplasia)    . Frequency of urination    .  Kidney stones    . AAA (abdominal aortic aneurysm) ([place])        followed by Dr. Deitra Mayo  . Stroke [facility]) 1994      "when I had ruptured aneurysm in my head"  . Cerebral aneurysm 1994  . Rheumatoid arthritis ([place])        "qwhere"  . Basal cell carcinoma        "LLE; some on my head"  . History of echocardiogram        a. 2D ECHO: 06/11/2014: EF 55-60%. Normal wall thickness. Indeterminate DD ( atrial flutter ). No RWMA. Mild LA dilation. Normal RV size and systolic function. No significant valvular abnormalities.  . Abnormal nuclear cardiac imaging test 07/13/2014      possible reversible inferior wall defect, felt artifact            Past Surgical History  Procedure Laterality Date  . Cerebral aneurysm repair   1994      Hx of ruptured brain aneurysm Tx by Dr. Ellene Route  . Excisional hemorrhoidectomy   1970's  . Cystoscopy w/ stone manipulation   1980's X 1  . Total knee arthroplasty Left 05/14/2014      Procedure: LEFT TOTAL KNEE ARTHROPLASTY;  Surgeon: Johnn Hai, MD;  Location: [facility];  Service: Orthopedics;  Laterality: Left;  . Joint replacement      .  Basal cell carcinoma excision          "LLE; top of my head"            Family History  Problem Relation Age of Onset  . Heart disease Father        Aneurysm  . Diabetes Father    . Heart attack Father    . Heart disease Brother        Heart Disease before age 33  . Cancer Brother    . Hypertension Neg Hx    . Stroke Neg Hx        Social History      Social History  Substance Use Topics  . Smoking status: Former Smoker -- 2.50 packs/day for 37 years      Types: Cigarettes      Quit date: 09/25/1992  . Smokeless tobacco: Never Used  . Alcohol Use: No            Current Outpatient Prescriptions  Medication Sig Dispense Refill  . acetaminophen (TYLENOL) 325 MG tablet Take 2 tablets (650 mg total) by mouth every 4 (four) hours as needed for headache or mild pain.      Marland Kitchen apixaban (ELIQUIS) 5 MG  TABS tablet Take 1 tablet (5 mg total) by mouth 2 (two) times daily. 60 tablet 11  . Docusate Calcium (STOOL SOFTENER PO) Take 1 tablet by mouth 2 (two) times daily.      . folic acid (FOLVITE) 1 MG tablet Take 1 mg by mouth every morning.       . lansoprazole (PREVACID) 30 MG capsule Take 30 mg by mouth every morning.       . methotrexate (RHEUMATREX) 2.5 MG tablet Take 12.5 mg by mouth every Monday.   1  . Multiple Vitamins-Minerals (CENTRUM SILVER ADULT 50+) TABS Take 50 mg by mouth daily.      Marland Kitchen oxyCODONE-acetaminophen (PERCOCET/ROXICET) 5-325 MG per tablet Take 0.5-1 tablets by mouth every 4 (four) hours as needed for moderate pain. Reported on 09/08/2015      . sotalol (BETAPACE) 80 MG tablet Take 1 tablet (80 mg total) by mouth 2 (two) times daily. 180 tablet 3    No current facility-administered medications for this visit.      No Known Allergies   Review of Systems    The patient has had previous knee replacement on left without cardiac or pulmonary complications. The patient is right-hand dominant. The patient denies previous history of thoracic trauma pneumothorax. No family history of lung cancer.          Review of Systems :  [ y ] = yes, [  ] = no         General :  Weight gain [   ]    Weight loss  [   ]  Fatigue [  ]  Fever [  ]  Chills  [  ]                                Weakness  [  ]            HEENT    Headache [  ]  Dizziness [  ]  Blurred vision [  ] Glaucoma  [  ]                          Nosebleeds [  ]  Painful or loose teeth [  ]         Cardiac :  Chest pain/ pressure [  ]  Resting SOB [  ] exertional SOB [  ]                        Orthopnea [  ]  Pedal edema  [  ]  Palpitations [  ] Syncope/presyncope '[ ]'$                         Paroxysmal nocturnal dyspnea [  ]          Pulmonary : cough [  ]  wheezing [  ]  Hemoptysis [  ] Sputum [  ] Snoring [  ]                              Pneumothorax [  ]  Sleep apnea [  ]         GI : Vomiting [  ]  Dysphagia [   ]  Melena  [  ]  Abdominal pain [  ] BRBPR [  ]              Heart burn [  ]  Constipation [  ] Diarrhea  [  ] Colonoscopy [   ]         GU : Hematuria [  ]  Dysuria [  ]  Nocturia [  ] UTI's [  ]         Vascular : Claudication [  ]  Rest pain [  ]  DVT [  ] Vein stripping [  ] leg ulcers [  ]                          TIA [  ] Stroke [  ]  Varicose veins [  ]         NEURO :  Headaches  [  ] Seizures [  ] Vision changes [  ] Paresthesias [  ]                                       Seizures [  ]         Musculoskeletal :  Arthritis [ yes ] Gout  [  ]  Back pain [  ]  Joint pain [ yes ]   status post left total knee replacement         Skin :  Rash [  ]  Melanoma [  ] Sores [  ]         Heme : Bleeding problems [  ]Clotting Disorders [  ] Anemia [  ]Blood Transfusion '[ ]'$          Endocrine : Diabetes [  ] Heat or Cold intolerance [  ] Polyuria [  ]excessive thirst '[ ]'$          Psych : Depression [  ]  Anxiety [  ]  Psych hospitalizations [  ] Memory change [  ]                      BP 119/78 mmHg  Pulse 70  Resp 16  Ht  $'5\' 9"'O$  (1.753 m)  Wt 232 lb (105.235 kg)  BMI 34.24 kg/m2  SpO2 96% Physical Exam       Physical Exam   General: Pleasant overweight 75 year old male no acute distress HEENT: Normocephalic pupils equal , dentition . Upper and lower plates Neck: Supple without JVD, adenopathy, or bruit Chest: Clear to auscultation, symmetrical breath sounds, no rhonchi, no tenderness             or deformity Cardiovascular: Regular rate and rhythm, no murmur, no gallop, peripheral pulses             palpable in all extremities Abdomen:  Soft, obese, nontender, no palpable mass or organomegaly Extremities: Warm, well-perfused, no clubbing cyanosis edema or tenderness,              no venous stasis changes of the legs Rectal/GU: Deferred Neuro: Grossly non--focal and symmetrical throughout Skin: Clean and dry without rash or ulceration     Diagnostic Tests: CT scan and PET  scan show a 2 cm pulmonary nodule right lower lobe very suspicious for a primary bronchogenic carcinoma. There is no evidence of mediastinal or distant metastatic involvement.   Impression: The patient was recommended to have the right lower lobe pulmonary nodule resected as it has shown clear increase in size over time and is associated with intense hypermetabolic activity. The patient's cardiac and pulmonary status appear to be adequate for VATS/lobectomy. Prior to surgery we will complete clinical staging with a brain CT scan, obtained pulmonary  function test to document adequate pulmonary reserve prior to resection, and cardiology clearance has been from his cardiologist Dr. [name] Plan:  R VATS and pulmonary resection of RLL mass The methotrexate will be held after surgery for about 2 weeks.   Len Childs, MD [facility] [phone]      Instructions    Return in about 2 weeks (around 04/13/2016) for Review studies.   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

## 2016-05-08 NOTE — Progress Notes (Signed)
The patient was examined and preop studies reviewed. There has been no change from the prior exam and the patient is ready for surgery.  plan R VATS and pulmonary resection on L Comp

## 2016-05-09 ENCOUNTER — Inpatient Hospital Stay (HOSPITAL_COMMUNITY): Payer: Medicare Other

## 2016-05-09 ENCOUNTER — Encounter (HOSPITAL_COMMUNITY): Payer: Self-pay | Admitting: Cardiothoracic Surgery

## 2016-05-09 LAB — CBC
HCT: 40.4 % (ref 39.0–52.0)
Hemoglobin: 13.5 g/dL (ref 13.0–17.0)
MCH: 31.4 pg (ref 26.0–34.0)
MCHC: 33.4 g/dL (ref 30.0–36.0)
MCV: 94 fL (ref 78.0–100.0)
Platelets: 189 10*3/uL (ref 150–400)
RBC: 4.3 MIL/uL (ref 4.22–5.81)
RDW: 14.5 % (ref 11.5–15.5)
WBC: 10.6 10*3/uL — ABNORMAL HIGH (ref 4.0–10.5)

## 2016-05-09 LAB — BASIC METABOLIC PANEL
Anion gap: 5 (ref 5–15)
BUN: 17 mg/dL (ref 6–20)
CO2: 27 mmol/L (ref 22–32)
Calcium: 8.4 mg/dL — ABNORMAL LOW (ref 8.9–10.3)
Chloride: 104 mmol/L (ref 101–111)
Creatinine, Ser: 1.1 mg/dL (ref 0.61–1.24)
GFR calc Af Amer: >60 mL/min (ref >60)
GFR calc non Af Amer: >60 mL/min (ref >60)
Glucose, Bld: 109 mg/dL — ABNORMAL HIGH (ref 65–99)
Potassium: 4.5 mmol/L (ref 3.5–5.1)
Sodium: 136 mmol/L (ref 135–145)

## 2016-05-09 LAB — GLUCOSE, CAPILLARY
Glucose-Capillary: 120 mg/dL — ABNORMAL HIGH (ref 65–99)
Glucose-Capillary: 122 mg/dL — ABNORMAL HIGH (ref 65–99)
Glucose-Capillary: 102 mg/dL — ABNORMAL HIGH (ref 65–99)
Glucose-Capillary: 89 mg/dL (ref 65–99)

## 2016-05-09 LAB — BLOOD GAS, ARTERIAL
Acid-base deficit: 2.3 mmol/L — ABNORMAL HIGH (ref 0.0–2.0)
Bicarbonate: 22.4 mEq/L (ref 20.0–24.0)
Drawn by: 225631
O2 Content: 5 L/min
O2 Saturation: 95.9 %
Patient temperature: 98.6
TCO2: 23.6 mmol/L (ref 0–100)
pCO2 arterial: 41.3 mmHg (ref 35.0–45.0)
pH, Arterial: 7.353 (ref 7.350–7.450)
pO2, Arterial: 80.9 mmHg (ref 80.0–100.0)

## 2016-05-09 MED ORDER — POTASSIUM CHLORIDE 10 MEQ/50ML IV SOLN
10.0000 meq | Freq: Once | INTRAVENOUS | Status: AC
  Administered 2016-05-09: 10 meq via INTRAVENOUS
  Filled 2016-05-09: qty 50

## 2016-05-09 MED ORDER — MOMETASONE FURO-FORMOTEROL FUM 100-5 MCG/ACT IN AERO
2.0000 | INHALATION_SPRAY | Freq: Two times a day (BID) | RESPIRATORY_TRACT | Status: DC
Start: 2016-05-09 — End: 2016-05-15
  Administered 2016-05-09 – 2016-05-15 (×11): 2 via RESPIRATORY_TRACT
  Filled 2016-05-09 (×3): qty 8.8

## 2016-05-09 MED ORDER — LEVALBUTEROL HCL 0.63 MG/3ML IN NEBU: 0.6300 mg | INHALATION_SOLUTION | Freq: Four times a day (QID) | RESPIRATORY_TRACT | Status: DC | PRN

## 2016-05-09 MED ORDER — SOTALOL HCL 80 MG PO TABS
80.0000 mg | ORAL_TABLET | Freq: Two times a day (BID) | ORAL | Status: DC
  Administered 2016-05-10 – 2016-05-15 (×11): 80 mg via ORAL
  Filled 2016-05-09 (×13): qty 1

## 2016-05-09 MED ORDER — FUROSEMIDE 10 MG/ML IJ SOLN
20.0000 mg | Freq: Two times a day (BID) | INTRAMUSCULAR | Status: AC
  Administered 2016-05-09 – 2016-05-10 (×3): 20 mg via INTRAVENOUS
  Filled 2016-05-09 (×3): qty 2

## 2016-05-09 NOTE — Care Management Note (Signed)
Case Management Note  Patient Details  Name: Jeffrey Stephens MRN: [id number] Date of Birth: [birth year]  Subjective/Objective:       Pt is s/p VATS - dx Lung CA             Action/Plan:  PTA independent from home with girlfriend.  Pt denied barriers to returning home.  CM will continue to follow for discharge needs   Expected Discharge Date:                  Expected Discharge Plan:     In-House Referral:     Discharge planning Services  CM Consult  Post Acute Care Choice:    Choice offered to:     DME Arranged:    DME Agency:     HH Arranged:    HH Agency:     Status of Service:  In process, will continue to follow  If discussed at Long Length of Stay Meetings, dates discussed:    Additional Comments:  Maryclare Labrador, RN 05/09/2016, 11:22 AM

## 2016-05-09 NOTE — Progress Notes (Signed)
1 Day Post-Op Procedure(s) (LRB): VIDEO ASSISTED THORACOSCOPY (VATS)/LUNG RESECTION (Right) Subjective: Postop pain improved Needs 5 L O2 CXR with atelectasis R base walked in hall  Objective: Vital signs in last 24 hours: Temp:  [97.3 F (36.3 C)-97.7 F (36.5 C)] 97.4 F (36.3 C) (08/15 0404) Pulse Rate:  [51-67] 56 (08/15 0700) Cardiac Rhythm: Sinus bradycardia (08/15 0400) Resp:  [9-20] 13 (08/15 0700) BP: (90-144)/(50-78) 105/57 (08/15 0700) SpO2:  [95 %-100 %] 96 % (08/15 0700) Arterial Line BP: (71-156)/(53-77) 130/53 (08/15 0700)  Hemodynamic parameters for last 24 hours:  nsr  Intake/Output from previous day: 08/14 0701 - 08/15 0700 In: 2862.5 [I.V.:2612.5; IV Piggyback:250] Out: 9678 [Urine:895; Blood:200; Chest Tube:550] Intake/Output this shift: Total I/O In: 50 [IV Piggyback:50] Out: -        Exam  no air leak from tubes    General- alert and comfortable   Lungs- clear without rales, wheezes   Cor- regular rate and rhythm, no murmur , gallop   Abdomen- soft, non-tender   Extremities - warm, non-tender, minimal edema   Neuro- oriented, appropriate, no focal weakness   Lab Results:  Recent Labs  05/09/16 0312  WBC 10.6*  HGB 13.5  HCT 40.4  PLT 189   BMET:  Recent Labs  05/09/16 0312  NA 136  K 4.5  CL 104  CO2 27  GLUCOSE 109*  BUN 17  CREATININE 1.10  CALCIUM 8.4*    PT/INR: No results for input(s): LABPROT, INR in the last 72 hours. ABG    Component Value Date/Time   PHART 7.353 05/09/2016 0520   HCO3 22.4 05/09/2016 0520   TCO2 23.6 05/09/2016 0520   ACIDBASEDEF 2.3 (H) 05/09/2016 0520   O2SAT 95.9 05/09/2016 0520   CBG (last 3)   Recent Labs  05/08/16 1723 05/08/16 2000 05/08/16 2256  GLUCAP 155* 112* 145*    Assessment/Plan: S/P Procedure(s) (LRB): VIDEO ASSISTED THORACOSCOPY (VATS)/LUNG RESECTION (Right) Diuresis Leave chest tubes    LOS: 1 day    Tharon Aquas Trigt III 05/09/2016

## 2016-05-09 NOTE — Progress Notes (Signed)
CT surgery p.m. Rounds  Holding preoperative had stable day, out of bed and ambulating No air leak from chest tubes with minimal drainage Will place chest tubes to waterseal tonight and probably remove 1 tube tomorrow Maintaining sinus rhythm  holding preoperative anticoagulation for afib- flutter  [Eliquis]- until later

## 2016-05-09 NOTE — Op Note (Signed)
Jeffrey Stephens, HELFMAN NO.:  [id number]  MEDICAL RECORD NO.:  [id number]  LOCATION:  2S15C                        FACILITY:  [facility]  PHYSICIAN:  Ivin Poot, M.D.  DATE OF BIRTH:  [birth year]  DATE OF PROCEDURE: DATE OF DISCHARGE:                              OPERATIVE REPORT   OPERATIONS: 1. Right  VATS (video-assisted thoracoscopic surgery) with wedge     resection of right lower lobe mass. 2. Mediastinal lymph node dissection. 3. Placement of wound On-Q analgesic irrigation system.  PREOPERATIVE DIAGNOSES: 1. A 2.5-cm right lower lobe nodule with hypermetabolic activity on     PET scan. 2. Frozen section revealed squamous cell carcinoma. 3. Chronic obstructive pulmonary disease with low DLCO diffusion     capacity 40% of predicted.  POSTOPERATIVE DIAGNOSES: 1. A 2.5-cm right lower lobe nodule with hypermetabolic activity on     PET scan. 2. Frozen section revealed squamous cell carcinoma. 3. Chronic obstructive pulmonary disease with low DLCO diffusion     capacity 40% of predicted.  SURGEON:  Ivin Poot, M.D.  ASSISTANT:  Valentina Gu, S.A.  ANESTHESIA:  General.  INDICATIONS:  The patient is a 75 year old reformed smoker, who was found to have a 2-cm right lower lobe nodule on CT scan of the abdomen for surveillance scanning of a 4-cm AAA.  He was referred for thoracic surgical evaluation.  PET scan showed this to be hypermetabolic with SUV of 11.9.  PFT showed moderate-to-severe COPD with diffusion capacity only 40% of predicted.  Cardiac history is positive for atrial fib- flutter, on chronic Eliquis and moderate coronary artery disease.  The patient was cleared for surgery by his cardiologist and a brain scan showed no evidence of metastatic disease.  The patient was a marginal candidate for thoracotomy due to his advanced age 75, underlying comorbidities and very low diffusion capacity.  However, transthoracic needle biopsy or  transbronchial biopsy would have low margin of success and high risk for bleeding since the nodule was adjacent to the inferior pulmonary vein.  I recommended VATS, wedge resection and nodal dissection in order to preserve pulmonary function. I discussed the procedure in detail with the patient and family currently the indications, benefits and alternatives.  I discussed the location of the surgical incision, the use of general anesthesia and the expected postoperative hospital recovery including chest tube drainage system and at least 48 hours in ICU.  I discussed the risks to him of the operation including the risks of bleeding, blood transfusion requirement, postoperative pneumonia, postoperative pulmonary failure requiring intubation and possible tracheostomy, postoperative MI and cardiac arrhythmias, postoperative infection, and death.  After reviewing these issues, he demonstrated his understanding and agreed to proceed with surgery under what I felt was an informed consent.  OPERATIVE FINDINGS: 1. The nodule was not only adjacent, but adhered to the inferior     pulmonary vein and was excised using a wedged staple technique and     dissection of the nodule off the pulmonary vein. 2. Inferior malignant lymph node was sent for frozen section, which     was negative. 3. Frozen section of the primary tumor showed squamous cell carcinoma  with the stapled margin still having malignant cells.  I do not     feel the patient is a candidate for further resection or     intrapericardial resection of the inferior pulmonary vein.  The     patient would not tolerate lobectomy because of his advanced COPD     and low diffusion capacity.  A partial vascular resection of the     pulmonary vein would probably be unsuccessful and have the high     risk of bleeding and/or thrombosis.  I elected to resect the     primary mass and to plan on future directed radiation therapy for     the positive  margin against the pulmonary vein.  OPERATIVE PROCEDURE:  The patient was brought to the operating room and placed supine on the operating table where general anesthesia was induced and a double-lumen endotracheal tube was placed by the Anesthesia team.  The patient was rolled to expose right side up.  The right chest was prepped and draped as a sterile field.  Then, a proper time-out was performed.  A small incision was made in the fifth interspace anterior to the tip of the right scapula.  The pleural space was entered.  The ribs were gently spread.  The lungs were examined. There was poor collapse of the lung and significant efforts were made by the Anesthesia team to provide single lung ventilation.  However, during the course repeatedly, single lung ventilation resulted in inadequate oxygen saturation, requiring reinflation of both lungs and dual lung ventilation.  That prolonged the surgery and made exposure difficult.  First, the inferior pulmonary ligament was taken down to mobilize the right lower lobe.  The right lower lobe was inspected and then palpated. The nodular density was found in the anteromedial segment, very close to the inferior vein.  The middle lobe was not separated well from the upper lobe.  A plane of dissection was developed to mobilize the tumor off the inferior vein.  The Endo-GIA stapling device was used to make a staple line between the middle lobe and the medial segment of the lower lobe.  A pulmonary artery branch to the medial segment was dissected out and encircled with a vessel loop.  A branch of this was ligated and divided to provide more mobility.  Finally, after dissecting the tumor off the inferior vein and by mobilizing the medial segment of the lower lobe, and a second lobe of stapling of lung parenchyma of the tumor was stapled off from the lower lobe.  The inferior ligament lymph node, which was approximately 1.5 cm was also dissected and  sent for frozen section.  Bleeding from the inferior vein was encountered and this was controlled with both ligature and clips.  The tumor was sent for frozen section.  The inferior ligament node was sent for frozen section.  A dissection in the azygos vein portion of the right hilum was performed and the lymph node was removed and sent for permanent section.  This did not appear to be high risk for cancer.  The upper lobe and middle lobe were palpated and there was no evidence of other nodules.  The lung was very abnormal with multiple small blebs and evidence of advanced stage emphysema.  The pathology report on the tumor returned as squamous cell cancer.  The staple margin had involvement of tumor.  The lymph node in the inferior ligament was negative for tumor.  I did not feel that the  patient would tolerate or benefit from a right lower lobectomy or vascular partial resection of the inferior vein.  A clip was placed to mark the tumor site for followup postop radiation.  Two chest tubes were placed in the anterior and posterior right pleural space and brought out through separate incisions.  These were secured to the skin.  Water was placed in the right pleural space and the lungs were inflated and there was no significant air leak.  The pleural space was suctioned out and the chest wall was closed with interrupted figure- of-eight #2 Vicryl pericostals.  Prior to tying these down, the lung was inflated under direct vision and filled the right pleural space well. The patient was then placed on double lung ventilation.  The chest wall was closed with interrupted #1 Vicryl for the muscle layers.  A running 2-0 Vicryl for the subcutaneous and a running subcuticular Vicryl for the skin was placed.  An On-Q catheter was placed in the chest wall beneath the main incision above the chest tube entry sites.  This was connected to a reservoir with 0.5% Marcaine. This was secured to the  skin with a silk suture.  Sterile dressings were placed on the incision and the patient was rolled back supine.  Total blood loss was approximately 150-200 mL.  The patient was then extubated and returned to the recovery room for further support and observation.     Ivin Poot, M.D.     PV/MEDQ  D:  05/08/2016  T:  05/09/2016  Job:  [id number]  cc:   Delanna Ahmadi, M.D.

## 2016-05-09 NOTE — Progress Notes (Signed)
Pt states that he does not wear BiPAP at home and declines to wear one at this time.

## 2016-05-10 ENCOUNTER — Inpatient Hospital Stay (HOSPITAL_COMMUNITY): Payer: Medicare Other

## 2016-05-10 LAB — COMPREHENSIVE METABOLIC PANEL
ALT: 15 U/L — ABNORMAL LOW (ref 17–63)
AST: 25 U/L (ref 15–41)
Albumin: 3.3 g/dL — ABNORMAL LOW (ref 3.5–5.0)
Alkaline Phosphatase: 47 U/L (ref 38–126)
Anion gap: 4 — ABNORMAL LOW (ref 5–15)
BUN: 12 mg/dL (ref 6–20)
CO2: 28 mmol/L (ref 22–32)
Calcium: 8.6 mg/dL — ABNORMAL LOW (ref 8.9–10.3)
Chloride: 104 mmol/L (ref 101–111)
Creatinine, Ser: 1 mg/dL (ref 0.61–1.24)
GFR calc Af Amer: >60 mL/min (ref >60)
GFR calc non Af Amer: >60 mL/min (ref >60)
Glucose, Bld: 106 mg/dL — ABNORMAL HIGH (ref 65–99)
Potassium: 4 mmol/L (ref 3.5–5.1)
Sodium: 136 mmol/L (ref 135–145)
Total Bilirubin: 1.1 mg/dL (ref 0.3–1.2)
Total Protein: 5.9 g/dL — ABNORMAL LOW (ref 6.5–8.1)

## 2016-05-10 LAB — CBC
HCT: 41.4 % (ref 39.0–52.0)
Hemoglobin: 13.7 g/dL (ref 13.0–17.0)
MCH: 31.6 pg (ref 26.0–34.0)
MCHC: 33.1 g/dL (ref 30.0–36.0)
MCV: 95.4 fL (ref 78.0–100.0)
Platelets: 172 10*3/uL (ref 150–400)
RBC: 4.34 MIL/uL (ref 4.22–5.81)
RDW: 14.6 % (ref 11.5–15.5)
WBC: 11.3 10*3/uL — ABNORMAL HIGH (ref 4.0–10.5)

## 2016-05-10 LAB — POCT I-STAT 7, (LYTES, BLD GAS, ICA,H+H)
Acid-base deficit: 1 mmol/L (ref 0.0–2.0)
Bicarbonate: 26.7 mEq/L — ABNORMAL HIGH (ref 20.0–24.0)
Calcium, Ion: 1.27 mmol/L — ABNORMAL HIGH (ref 1.12–1.23)
HCT: 41 % (ref 39.0–52.0)
Hemoglobin: 13.9 g/dL (ref 13.0–17.0)
O2 Saturation: 94 %
Potassium: 4.6 mmol/L (ref 3.5–5.1)
Sodium: 139 mmol/L (ref 135–145)
TCO2: 28 mmol/L (ref 0–100)
pCO2 arterial: 56.1 mmHg — ABNORMAL HIGH (ref 35.0–45.0)
pH, Arterial: 7.285 — ABNORMAL LOW (ref 7.350–7.450)
pO2, Arterial: 80 mmHg (ref 80.0–100.0)

## 2016-05-10 LAB — GLUCOSE, CAPILLARY
Glucose-Capillary: 92 mg/dL (ref 65–99)
Glucose-Capillary: 113 mg/dL — ABNORMAL HIGH (ref 65–99)
Glucose-Capillary: 129 mg/dL — ABNORMAL HIGH (ref 65–99)
Glucose-Capillary: 106 mg/dL — ABNORMAL HIGH (ref 65–99)

## 2016-05-10 MED ORDER — POTASSIUM CHLORIDE 10 MEQ/50ML IV SOLN
10.0000 meq | INTRAVENOUS | Status: AC
  Administered 2016-05-10: 10 meq via INTRAVENOUS
  Filled 2016-05-10 (×2): qty 50

## 2016-05-10 MED ORDER — FUROSEMIDE 10 MG/ML IJ SOLN: 20.0000 mg | Freq: Once | INTRAMUSCULAR | Status: AC

## 2016-05-10 MED ORDER — FENTANYL CITRATE (PF) 100 MCG/2ML IJ SOLN: 25.0000 ug | INTRAMUSCULAR | Status: DC | PRN

## 2016-05-10 NOTE — Progress Notes (Signed)
2 Days Post-Op Procedure(s) (LRB): VIDEO ASSISTED THORACOSCOPY (VATS)/LUNG RESECTION (Right) Subjective: R VATS RLL wedge resection,w/ node dissection / COPD Squamous cell Ca by frozen No air leak from tubes- DC posterior tube tx to stepdown Objective: Vital signs in last 24 hours: Temp:  [97.5 F (36.4 C)-99 F (37.2 C)] 97.9 F (36.6 C) (08/16 0700) Pulse Rate:  [58-83] 83 (08/16 0800) Cardiac Rhythm: Normal sinus rhythm (08/16 0700) Resp:  [12-22] 18 (08/16 0800) BP: (99-147)/(52-92) 117/60 (08/16 0800) SpO2:  [90 %-100 %] 90 % (08/16 0800)  Hemodynamic parameters for last 24 hours:  stable  Intake/Output from previous day: 08/15 0701 - 08/16 0700 In: 871.8 [I.V.:771.8; IV Piggyback:100] Out: 3557 [Urine:4035; Chest Tube:180] Intake/Output this shift: No intake/output data recorded.       Exam    General- alert and comfortable   Lungs- clear without rales, wheezes   Cor- regular rate and rhythm, no murmur , gallop   Abdomen- soft, non-tender   Extremities - warm, non-tender, minimal edema   Neuro- oriented, appropriate, no focal weakness    Lab Results:  Recent Labs  05/09/16 0312 05/10/16 0410  WBC 10.6* 11.3*  HGB 13.5 13.7  HCT 40.4 41.4  PLT 189 172   BMET:  Recent Labs  05/09/16 0312 05/10/16 0410  NA 136 136  K 4.5 4.0  CL 104 104  CO2 27 28  GLUCOSE 109* 106*  BUN 17 12  CREATININE 1.10 1.00  CALCIUM 8.4* 8.6*    PT/INR: No results for input(s): LABPROT, INR in the last 72 hours. ABG    Component Value Date/Time   PHART 7.353 05/09/2016 0520   HCO3 22.4 05/09/2016 0520   TCO2 23.6 05/09/2016 0520   ACIDBASEDEF 2.3 (H) 05/09/2016 0520   O2SAT 95.9 05/09/2016 0520   CBG (last 3)   Recent Labs  05/09/16 2158 05/10/16 0338 05/10/16 0734  GLUCAP 89 92 113*    Assessment/Plan: S/P Procedure(s) (LRB): VIDEO ASSISTED THORACOSCOPY (VATS)/LUNG RESECTION (Right) Mobilize Diuresis d/c tubes/lines Plan for transfer to step-down:  see transfer orders   LOS: 2 days    Tharon Aquas Trigt III 05/10/2016

## 2016-05-10 NOTE — Care Management Important Message (Signed)
Important Message  Patient Details  Name: Jeffrey Stephens MRN: [id number] Date of Birth: Oct 24, 1940   Medicare Important Message Given:  Yes    Loann Quill 05/10/2016, 11:43 AM

## 2016-05-10 NOTE — Progress Notes (Signed)
Patient ID: Jeffrey Stephens, male   DOB: 12-22-1940, 75 y.o.   MRN: [id number] EVENING ROUNDS NOTE :     [address]       [place] [address]             [phone]                 2 Days Post-Op Procedure(s) (LRB): VIDEO ASSISTED THORACOSCOPY (VATS)/LUNG RESECTION (Right)  Total Length of Stay:  LOS: 2 days  BP 130/73   Pulse 86   Temp 98.3 F (36.8 C) (Oral)   Resp 17   Wt 233 lb (105.7 kg)   SpO2 98%   BMI 34.41 kg/m   .Intake/Output      08/15 0701 - 08/16 0700 08/16 0701 - 08/17 0700   P.O.  480   I.V. (mL/kg) 771.8 (7.3) 330 (3.1)   IV Piggyback 100    Total Intake(mL/kg) 871.8 (8.2) 810 (7.7)   Urine (mL/kg/hr) 4035 (1.6) 1200 (1)   Emesis/NG output  50 (0)   Blood     Chest Tube 180 (0.1) 30 (0)   Total Output 4215 1280   Net -3343.3 -470        Urine Occurrence  2 x     . bupivacaine ON-Q pain pump    . dextrose 5 % and 0.45 % NaCl with KCl 10 mEq/L 30 mL/hr (05/10/16 0542)     Lab Results  Component Value Date   WBC 11.3 (H) 05/10/2016   HGB 13.7 05/10/2016   HCT 41.4 05/10/2016   PLT 172 05/10/2016   GLUCOSE 106 (H) 05/10/2016   CHOL 95 06/12/2014   TRIG 80 06/12/2014   HDL 32 (L) 06/12/2014   LDLCALC 47 06/12/2014   ALT 15 (L) 05/10/2016   AST 25 05/10/2016   NA 136 05/10/2016   K 4.0 05/10/2016   CL 104 05/10/2016   CREATININE 1.00 05/10/2016   BUN 12 05/10/2016   CO2 28 05/10/2016   TSH 1.21 01/26/2016   INR 1.10 05/04/2016   HGBA1C 5.3 06/11/2014   Up in  Chair Good  Respiratory  effort   Grace Isaac MD  Beeper [phone] Office [phone] 05/10/2016 6:35 PM

## 2016-05-11 ENCOUNTER — Inpatient Hospital Stay (HOSPITAL_COMMUNITY): Payer: Medicare Other

## 2016-05-11 LAB — TYPE AND SCREEN
ABO/RH(D): B POS
Antibody Screen: NEGATIVE
Unit division: 0
Unit division: 0
Unit division: 0
Unit division: 0

## 2016-05-11 LAB — BASIC METABOLIC PANEL
Anion gap: 6 (ref 5–15)
BUN: 11 mg/dL (ref 6–20)
CO2: 30 mmol/L (ref 22–32)
Calcium: 8.6 mg/dL — ABNORMAL LOW (ref 8.9–10.3)
Chloride: 103 mmol/L (ref 101–111)
Creatinine, Ser: 0.93 mg/dL (ref 0.61–1.24)
GFR calc Af Amer: >60 mL/min (ref >60)
GFR calc non Af Amer: >60 mL/min (ref >60)
Glucose, Bld: 109 mg/dL — ABNORMAL HIGH (ref 65–99)
Potassium: 4 mmol/L (ref 3.5–5.1)
Sodium: 139 mmol/L (ref 135–145)

## 2016-05-11 LAB — CBC
HCT: 39.9 % (ref 39.0–52.0)
Hemoglobin: 13 g/dL (ref 13.0–17.0)
MCH: 31 pg (ref 26.0–34.0)
MCHC: 32.6 g/dL (ref 30.0–36.0)
MCV: 95.2 fL (ref 78.0–100.0)
Platelets: 170 10*3/uL (ref 150–400)
RBC: 4.19 MIL/uL — ABNORMAL LOW (ref 4.22–5.81)
RDW: 14.5 % (ref 11.5–15.5)
WBC: 10.5 10*3/uL (ref 4.0–10.5)

## 2016-05-11 LAB — GLUCOSE, CAPILLARY: Glucose-Capillary: 111 mg/dL — ABNORMAL HIGH (ref 65–99)

## 2016-05-11 MED ORDER — ENOXAPARIN SODIUM 40 MG/0.4ML ~~LOC~~ SOLN
40.0000 mg | Freq: Every day | SUBCUTANEOUS | Status: DC
  Administered 2016-05-11 – 2016-05-15 (×4): 40 mg via SUBCUTANEOUS
  Filled 2016-05-11 (×4): qty 0.4

## 2016-05-11 NOTE — Progress Notes (Signed)
Patient TX to 2W14 for 2S. Pt A&O times 4. No Pain. No SOB. VSS. Chest tube suture dressing changed. Old dressing saturated with serosanguinous drainage. Call bell given to patient. Patient oriented to unit and room. Will continue to monitor.  Domingo Dimes RN

## 2016-05-11 NOTE — Progress Notes (Signed)
Transferred to 2 W 14 via WC and monitor and O2. Pt placed in chair with O2 resumed, NT in room to receive. SCD's with pt.

## 2016-05-11 NOTE — Progress Notes (Signed)
Attempted to call report to 2W 

## 2016-05-11 NOTE — Progress Notes (Signed)
Report called to 2W RN 

## 2016-05-11 NOTE — Progress Notes (Signed)
3 Days Post-Op Procedure(s) (LRB): VIDEO ASSISTED THORACOSCOPY (VATS)/LUNG RESECTION (Right) Subjective: Patient doing well, ambulating, 2 L nasal cannula Minimal chest tube drainage, no air leak Remove final chest tube today and transfer to floor  Objective: Vital signs in last 24 hours: Temp:  [97.7 F (36.5 C)-98 F (36.7 C)] 97.8 F (36.6 C) (08/17 1100) Pulse Rate:  [64-86] 70 (08/17 1100) Cardiac Rhythm: Normal sinus rhythm (08/17 1209) Resp:  [10-21] 20 (08/17 1100) BP: (99-134)/(53-75) 128/67 (08/17 1100) SpO2:  [95 %-100 %] 97 % (08/17 1100) Weight:  [232 lb 12.9 oz (105.6 kg)] 232 lb 12.9 oz (105.6 kg) (08/17 0606)  Hemodynamic parameters for last 24 hours:  stable  Intake/Output from previous day: 08/16 0701 - 08/17 0700 In: 1200 [P.O.:480; I.V.:720] Out: 1805 [Urine:1725; Emesis/NG output:50; Chest Tube:30] Intake/Output this shift: Total I/O In: 270 [P.O.:240; I.V.:30] Out: 200 [Urine:200]        Exam    General- alert and comfortable   Lungs- clear without rales, wheezes   Cor- regular rate and rhythm, no murmur , gallop   Abdomen- soft, non-tender   Extremities - warm, non-tender, minimal edema   Neuro- oriented, appropriate, no focal weakness    Surgical incision clean and dry Lab Results:  Recent Labs  05/10/16 0410 05/11/16 0310  WBC 11.3* 10.5  HGB 13.7 13.0  HCT 41.4 39.9  PLT 172 170   BMET:  Recent Labs  05/10/16 0410 05/11/16 0310  NA 136 139  K 4.0 4.0  CL 104 103  CO2 28 30  GLUCOSE 106* 109*  BUN 12 11  CREATININE 1.00 0.93  CALCIUM 8.6* 8.6*    PT/INR: No results for input(s): LABPROT, INR in the last 72 hours. ABG    Component Value Date/Time   PHART 7.353 05/09/2016 0520   HCO3 22.4 05/09/2016 0520   TCO2 23.6 05/09/2016 0520   ACIDBASEDEF 2.3 (H) 05/09/2016 0520   O2SAT 95.9 05/09/2016 0520   CBG (last 3)   Recent Labs  05/10/16 1130 05/10/16 1503 05/11/16 0722  GLUCAP 129* 106* 111*     Assessment/Plan: S/P Procedure(s) (LRB): VIDEO ASSISTED THORACOSCOPY (VATS)/LUNG RESECTION (Right) Mobilize Transfer to telemetry Follow-up pathology  LOS: 3 days    Tharon Aquas Trigt III 05/11/2016

## 2016-05-12 ENCOUNTER — Inpatient Hospital Stay (HOSPITAL_COMMUNITY): Payer: Medicare Other

## 2016-05-12 LAB — BASIC METABOLIC PANEL
Anion gap: 4 — ABNORMAL LOW (ref 5–15)
BUN: 11 mg/dL (ref 6–20)
CO2: 32 mmol/L (ref 22–32)
Calcium: 9 mg/dL (ref 8.9–10.3)
Chloride: 105 mmol/L (ref 101–111)
Creatinine, Ser: 1.01 mg/dL (ref 0.61–1.24)
GFR calc Af Amer: >60 mL/min (ref >60)
GFR calc non Af Amer: >60 mL/min (ref >60)
Glucose, Bld: 111 mg/dL — ABNORMAL HIGH (ref 65–99)
Potassium: 5.2 mmol/L — ABNORMAL HIGH (ref 3.5–5.1)
Sodium: 141 mmol/L (ref 135–145)

## 2016-05-12 MED ORDER — SODIUM CHLORIDE 0.9% FLUSH
10.0000 mL | INTRAVENOUS | Status: DC | PRN
  Administered 2016-05-14: 20 mL
  Filled 2016-05-12: qty 40

## 2016-05-12 MED ORDER — POLYETHYLENE GLYCOL 3350 17 G PO PACK
17.0000 g | PACK | Freq: Every day | ORAL | Status: DC
  Administered 2016-05-14 – 2016-05-15 (×2): 17 g via ORAL
  Filled 2016-05-12 (×3): qty 1

## 2016-05-12 MED ORDER — ZOLPIDEM TARTRATE 5 MG PO TABS
5.0000 mg | ORAL_TABLET | Freq: Every evening | ORAL | Status: DC | PRN
  Administered 2016-05-12 – 2016-05-13 (×2): 5 mg via ORAL
  Filled 2016-05-12 (×2): qty 1

## 2016-05-12 NOTE — Care Management Important Message (Signed)
Important Message  Patient Details  Name: Jeffrey Stephens MRN: [id number] Date of Birth: 1941/05/23   Medicare Important Message Given:  Yes    Nathen May 05/12/2016, 10:38 AM

## 2016-05-12 NOTE — Progress Notes (Signed)
[address]       [place] [address]             [phone]      4 Days Post-Op Procedure(s) (LRB): VIDEO ASSISTED THORACOSCOPY (VATS)/LUNG RESECTION (Right) Subjective: Feels well. Small PNTX on cxr  Objective: Vital signs in last 24 hours: Temp:  [97.8 F (36.6 C)-98.3 F (36.8 C)] 98.3 F (36.8 C) (08/18 0435) Pulse Rate:  [70-85] 85 (08/18 0435) Cardiac Rhythm: Normal sinus rhythm (08/17 2136) Resp:  [18-20] 18 (08/18 0435) BP: (128-136)/(67-75) 136/74 (08/18 0435) SpO2:  [93 %-97 %] 94 % (08/18 0843)  Hemodynamic parameters for last 24 hours:    Intake/Output from previous day: 08/17 0701 - 08/18 0700 In: 630 [P.O.:600; I.V.:30] Out: 750 [Urine:750] Intake/Output this shift: No intake/output data recorded.  General appearance: alert, cooperative and no distress Heart: regular rate and rhythm Lungs: slightly coarse Abdomen: benign Extremities: no edema Wound: incis healing well  Lab Results:  Recent Labs  05/10/16 0410 05/11/16 0310  WBC 11.3* 10.5  HGB 13.7 13.0  HCT 41.4 39.9  PLT 172 170   BMET:  Recent Labs  05/11/16 0310 05/12/16 0305  NA 139 141  K 4.0 5.2*  CL 103 105  CO2 30 32  GLUCOSE 109* 111*  BUN 11 11  CREATININE 0.93 1.01  CALCIUM 8.6* 9.0    PT/INR: No results for input(s): LABPROT, INR in the last 72 hours. ABG    Component Value Date/Time   PHART 7.353 05/09/2016 0520   HCO3 22.4 05/09/2016 0520   TCO2 23.6 05/09/2016 0520   ACIDBASEDEF 2.3 (H) 05/09/2016 0520   O2SAT 95.9 05/09/2016 0520   CBG (last 3)   Recent Labs  05/10/16 1130 05/10/16 1503 05/11/16 0722  GLUCAP 129* 106* 111*    Meds Scheduled Meds: . acetaminophen  1,000 mg Oral Q6H   Or  . acetaminophen (TYLENOL) oral liquid 160 mg/5 mL  1,000 mg Oral Q6H  . bisacodyl  10 mg Oral Daily  . docusate sodium  200 mg Oral BID  . enoxaparin (LOVENOX) injection  40 mg Subcutaneous Daily  . folic acid  1 mg Oral q morning -  10a  . metoCLOPramide (REGLAN) injection  10 mg Intravenous Q6H  . mometasone-formoterol  2 puff Inhalation BID  . multivitamin with minerals  1 tablet Oral Daily  . pantoprazole  40 mg Oral Daily  . sotalol  80 mg Oral BID   Continuous Infusions:  PRN Meds:.fentaNYL (SUBLIMAZE) injection, levalbuterol, ondansetron (ZOFRAN) IV, oxyCODONE, potassium chloride, sodium chloride flush, traMADol  Xrays Dg Chest 2 View  Result Date: 05/12/2016 CLINICAL DATA:  Chest tube removal EXAM: CHEST  2 VIEW COMPARISON:  May 11, 2016 FINDINGS: Right chest tube has been removed. There is a right-sided pneumothorax with small right pleural effusion. No tension component. Central catheter tip is in the superior vena cava. There is bibasilar atelectasis. Lungs elsewhere clear. Heart size and pulmonary vascularity are normal. There is atherosclerotic calcification in the aorta. No adenopathy. No bone lesions. IMPRESSION: Right-sided pneumothorax with small right pleural effusion following chest tube removal. No tension component. Bibasilar atelectasis. No airspace consolidation. Aortic atherosclerosis. Critical Value/emergent results were called by telephone at the time of interpretation on 05/12/2016 at 7:34 am to Dr. Ivin Poot , who verbally acknowledged these results. Electronically Signed   By: Lowella Grip III M.D.   On: 05/12/2016 07:34   Dg Chest [facility]  Result Date: 05/11/2016 CLINICAL DATA:  Sore chest.  Chest tube. EXAM: PORTABLE CHEST 1 VIEW COMPARISON:  05/10/2016 . FINDINGS: Interim removal of 1 of the 2 right chest tubes. Right IJ line in stable position . No pneumothorax. Stable cardiomegaly. Persistent low lung volumes with bibasilar atelectasis and/or infiltrates. Stable small left pleural effusion. IMPRESSION: 1. Interim removal of 1 of the 2 right chest tubes. Remaining chest tube in stable position. Right IJ line stable position . No pneumothorax. 2. Low lung volumes with bibasilar  atelectasis and/or infiltrates again noted. Stable small left pleural effusion. 3. Stable cardiomegaly. Electronically Signed   By: Marcello Moores  Register   On: 05/11/2016 07:25    Assessment/Plan: S/P Procedure(s) (LRB): VIDEO ASSISTED THORACOSCOPY (VATS)/LUNG RESECTION (Right)  1 requesting sleep aid at night, will orderambien 2 monitor pntx clinically, cxr in am 3 routine pulm toilet/nebs 4 K+ sl elevated, creat ok, not receiving supplement 5 prob home in am   LOS: 4 days    Rabia Argote E 05/12/2016

## 2016-05-12 NOTE — Discharge Instructions (Signed)
Lung Cancer °Lung cancer occurs when abnormal cells in the lung grow out of control and form a mass (tumor). There are several types of lung cancer. The two most common types are: °· Non-small cell. In this type of lung cancer, abnormal cells are larger and grow more slowly than those of small cell lung cancer. °· Small cell. In this type of lung cancer, abnormal cells are smaller than those of non-small cell lung cancer. Small cell lung cancer gets worse faster than non-small cell lung cancer. °CAUSES  °The leading cause of lung cancer is smoking tobacco. The second leading cause is radon exposure. °RISK FACTORS °· Smoking tobacco. °· Exposure to secondhand tobacco smoke. °· Exposure to radon gas. °· Exposure to asbestos. °· Exposure to arsenic in drinking water. °· Air pollution. °· Family or personal history of lung cancer. °· Lung radiation therapy. °· Being older than 65 years. °SIGNS AND SYMPTOMS  °In the early stages, symptoms may not be present. As the cancer progresses, symptoms may include: °· A lasting cough, possibly with blood. °· Fatigue. °· Unexplained weight loss. °· Shortness of breath. °· Wheezing. °· Chest pain. °· Loss of appetite. °Symptoms of advanced lung cancer include: °· Hoarseness. °· Bone or joint pain. °· Weakness. °· Nail problems. °· Face or arm swelling. °· Paralysis of the face. °· Drooping eyelids. °DIAGNOSIS  °Lung cancer can be identified with a physical exam and with tests such as: °· A chest X-ray. °· A CT scan. °· Blood tests. °· A biopsy. °After a diagnosis is made, you will have more tests to determine the stage of the cancer. The stages of non-small cell lung cancer are: °· Stage 0, also called carcinoma in situ. At this stage, abnormal cells are found in the inner lining of your lung or lungs. °· Stage I. At this stage, abnormal cells have grown into a tumor that is no larger than 5 cm across. The cancer has entered the deeper lung tissue but has not yet entered the lymph  nodes or other parts of the body. °· Stage II. At this stage, the tumor is 7 cm across or smaller and has entered nearby lymph nodes. Or, the tumor is 5 cm across or smaller and has invaded surrounding tissue but is not found in nearby lymph nodes. There may be more than one tumor present. °· Stage III. At this stage, the tumor may be any size. There may be more than one tumor in the lungs. The cancer cells have spread to the lymph nodes and possibly to other organs. °· Stage IV. At this stage, there are tumors in both lungs and the cancer has spread to other areas of the body. °The stages of small cell lung cancer are: °· Limited. At this stage, the cancer is found only on one side of the chest. °· Extensive. At this stage, the cancer is in the lungs and in tissues on the other side of the chest. The cancer has spread to other organs or is found in the fluid between the layers of your lungs. °TREATMENT  °Depending on the type and stage of your lung cancer, you may be treated with: °· Surgery. This is done to remove a tumor. °· Radiation therapy. This treatment destroys cancer cells using X-rays or other types of radiation. °· Chemotherapy. This treatment uses medicines to destroy cancer cells. °· Targeted therapy. This treatment aims to destroy only cancer cells instead of all cells as other therapies do. °You may   also have a combination of treatments. HOME CARE INSTRUCTIONS   Do not use any tobacco products. This includes cigarettes, chewing tobacco, and electronic cigarettes. If you need help quitting, ask your health care provider.  Take medicines only as directed by your health care provider.  Eat a healthy diet. Work with a dietitian to make sure you are getting the nutrition you need.  Consider joining a support group or seeking counseling to help you cope with the stress of having lung cancer.  Let your cancer specialist (oncologist) know if you are admitted to the hospital.  Keep all follow-up  visits as directed by your health care provider. This is important. SEEK MEDICAL CARE IF:   You lose weight without trying.  You have a persistent cough and wheezing.  You feel short of breath.  You tire easily.  You experience bone or joint pain.  You have difficulty swallowing.  You feel hoarse or notice your voice changing.  Your pain medicine is not helping. SEEK IMMEDIATE MEDICAL CARE IF:   You cough up blood.  You have new breathing problems.  You develop chest pain.  You develop swelling in:  One or both ankles or legs.  Your face, neck, or arms.  You are confused.  You experience paralysis in your face or a drooping eyelid.   This information is not intended to replace advice given to you by your health care provider. Make sure you discuss any questions you have with your health care provider.   Document Released: 12/18/2000 Document Revised: 06/02/2015 Document Reviewed: 01/15/2014 Elsevier Interactive Patient Education 2016 [place] Thoracotomy, Care After Refer to this sheet in the next few weeks. These instructions provide you with information on caring for yourself after your procedure. Your caregiver may also give you more specific instructions. Your procedure has been planned according to current medical practices, but problems sometimes occur. Call your caregiver if you have any problems or questions after your procedure. HOME CARE INSTRUCTIONS   Only take over-the-counter or prescription medications as directed.  Only take pain medications (narcotics) as directed.  Do not drive until your caregiver approves. Driving while taking narcotics or soon after surgery can be dangerous, so discuss the specific timing with your caregiver.  Avoid activities that use your chest muscles, such as lifting heavy objects, for at least 3-4 weeks.   Take deep breaths to expand the lungs and to protect against pneumonia.  Do breathing exercises  as directed by your caregiver. If you were given an incentive spirometer to help with breathing, use it as directed.  You may resume a normal diet and activities when you feel you are able to or as directed.  Do not take a bath until your caregiver says it is OK. Use the shower instead.   Keep the bandage (dressing) covering the area where the chest tube was inserted (incision site) dry for 48 hours. After 48 hours, remove the dressing unless there is new drainage.  Remove dressings as directed by your caregiver.  Change dressings if necessary or as directed.  Keep all follow-up appointments. It is important for you to see your caregiver after surgery to discuss appropriate follow-up care and surveillance, if it is necessary. SEEK MEDICAL CARE:  You feel excessive or increasing pain at an incision site.  You notice bleeding, skin irritation, drainage, swelling, or redness at an incision site.  There is a bad smell coming from an incision or dressing.  It feels like your heart  is fluttering or beating rapidly.  Your pain medication does not relieve your pain. SEEK IMMEDIATE MEDICAL CARE IF:   You have a fever.   You have chest pain.  You have a rash.  You have shortness of breath.  You have trouble breathing.   You feel weak, lightheaded, dizzy, or faint.  MAKE SURE YOU:   Understand these instructions.   Will watch your condition.   Will get help right away if you are not doing well or get worse.   This information is not intended to replace advice given to you by your health care provider. Make sure you discuss any questions you have with your health care provider.   Document Released: 01/06/2013 Document Revised: 10/02/2014 Document Reviewed: 01/06/2013 Elsevier Interactive Patient Education [employer].

## 2016-05-12 NOTE — Discharge Summary (Signed)
Physician Discharge Summary  Patient ID: Jeffrey Stephens MRN: [id number] DOB/AGE: [birth year] 75 y.o.  Admit date: 05/08/2016 Discharge date: 05/15/2016  Admission Diagnoses: Lung nodule  Discharge Diagnoses:  Active Problems:   Lung cancer, lower lobe (HCC)   Discharged Condition: good  HPI:  The patient presents with a 2 cm pulmonary nodule in the medial aspect of the right lower lobe close to inferior pulmonary vein hypermetabolic on PET scan with SUV of 23. This was gradually enlarging over the past 2-3 years consistent with bronchogenic carcinoma. The patient returns for further discussion of the right lower lobe hypermetabolic nodule. The patient obtained a brain CT scan which showed no evidence of metastatic disease. The patient underwent pulmonary function testing which showed adequate pulmonary mechanics-FVC and FEV1 80-90 percent of predicted. Diffusion capacity was low at 46% predicted. However I walk the patient 200 feet in the office andh is room air oxygen saturation increased from 94% to 96% The patient was seen by his cardiologist Dr [name] who feels the patient has stable angina adequate LV function and is cleared for surgery. Plan for right VATS and pulmonary wedge resection at [facility].   Hospital Course:  On 05/08/2016 Jeffrey Stephens underwent a video-assisted thoracoscopy on the right with a wedge resection of a right lower lobe nodule. He tolerated the procedure well. He was extubated and requiring 5 L nasal cannula. We continued his chest tubes this day. We initiated a diuretic regimen. On postop day 2 all of his chest tubes and lines were discontinued. He continued to diurese appropriately. We began to mobilize the patient.  We encouraged use of incentive spirometer and ambulation. He was weaned to 2 L nasal cannula. His final chest tube was removed due to minimal drainage and no air leak and he was transferred to the floor.The pneumothorax did recur and he was  observed closely but ultimately required placement of a new chest tube. He has been placed on mini express and will be discharged home with this and home health. He is progressing appropriately and ready for discharge.   Pathology pending.   Consults: None  Significant Diagnostic Studies: routine post-op labs and serial chest xrays.   Treatments: surgery:   OPERATIONS: 1. Right  VATS (video-assisted thoracoscopic surgery) with wedge     resection of right lower lobe mass. 2. Mediastinal lymph node dissection. 3. Placement of wound On-Q analgesic irrigation system.  Discharge Exam: Blood pressure 121/66, pulse 95, temperature 98.2 F (36.8 C), temperature source Oral, resp. rate 18, height '5\' 9"'$  (1.753 m), weight 232 lb 12.9 oz (105.6 kg), SpO2 94 %.  General appearance: alert and cooperative Lungs: clear to auscultation bilaterally and decreased breath sounds in RLL Abdomen: soft, non-tender Extremities: edema 1+ pedal edema Wound: c/d/i  Disposition: 01-Home or Self Care     Medication List    STOP taking these medications   methotrexate 2.5 MG tablet Commonly known as:  RHEUMATREX   oxyCODONE-acetaminophen 5-325 MG tablet Commonly known as:  PERCOCET/ROXICET   STOOL SOFTENER PO     TAKE these medications   acetaminophen 325 MG tablet Commonly known as:  TYLENOL Take 2 tablets (650 mg total) by mouth every 4 (four) hours as needed for headache or mild pain.   apixaban 5 MG Tabs tablet Commonly known as:  ELIQUIS Take 1 tablet (5 mg total) by mouth 2 (two) times daily.   CENTRUM SILVER ADULT 50+ Tabs Take 50 mg by mouth daily.   folic acid  1 MG tablet Commonly known as:  FOLVITE Take 1 mg by mouth every morning.   lansoprazole 30 MG capsule Commonly known as:  PREVACID Take 30 mg by mouth every morning.   oxyCODONE 5 MG immediate release tablet Commonly known as:  Oxy IR/ROXICODONE Take 1-2 tablets (5-10 mg total) by mouth every 6 (six) hours as needed  for severe pain.   sotalol 80 MG tablet Commonly known as:  BETAPACE Take 1 tablet (80 mg total) by mouth 2 (two) times daily.      Follow-up Information    Tharon Aquas Adelene Idler, MD .   Specialty:  Cardiothoracic Surgery Why:  Appointment is on 05/31/2016 at 1:30pm. Please arrive at 1:00 and report to [facility] imaging on the first floor of our office building.  Contact information: [address] [place] [address] [phone]           Signed: John Giovanni 05/15/2016, 9:43 AM

## 2016-05-13 ENCOUNTER — Inpatient Hospital Stay (HOSPITAL_COMMUNITY): Payer: Medicare Other

## 2016-05-13 NOTE — Progress Notes (Addendum)
[address]       [place] [address]             [phone]      5 Days Post-Op Procedure(s) (LRB): VIDEO ASSISTED THORACOSCOPY (VATS)/LUNG RESECTION (Right) Subjective:  feels well, currently on 2 liters O2  Objective: Vital signs in last 24 hours: Temp:  [97.8 F (36.6 C)-98.5 F (36.9 C)] 97.9 F (36.6 C) (08/19 0428) Pulse Rate:  [73-81] 73 (08/19 0428) Cardiac Rhythm: Normal sinus rhythm (08/19 0718) Resp:  [18-19] 18 (08/19 0428) BP: (120-142)/(58-70) 120/59 (08/19 0428) SpO2:  [94 %-98 %] 94 % (08/19 0428)  Hemodynamic parameters for last 24 hours:    Intake/Output from previous day: 08/18 0701 - 08/19 0700 In: 600 [P.O.:600] Out: -  Intake/Output this shift: No intake/output data recorded.  General appearance: alert, cooperative and no distress Heart: regular rate and rhythm Lungs: mildly dim in right base Abdomen: benign Extremities: no edema Wound: incis healing well  Lab Results:  Recent Labs  05/11/16 0310  WBC 10.5  HGB 13.0  HCT 39.9  PLT 170   BMET:  Recent Labs  05/11/16 0310 05/12/16 0305  NA 139 141  K 4.0 5.2*  CL 103 105  CO2 30 32  GLUCOSE 109* 111*  BUN 11 11  CREATININE 0.93 1.01  CALCIUM 8.6* 9.0    PT/INR: No results for input(s): LABPROT, INR in the last 72 hours. ABG    Component Value Date/Time   PHART 7.353 05/09/2016 0520   HCO3 22.4 05/09/2016 0520   TCO2 23.6 05/09/2016 0520   ACIDBASEDEF 2.3 (H) 05/09/2016 0520   O2SAT 95.9 05/09/2016 0520   CBG (last 3)   Recent Labs  05/10/16 1130 05/10/16 1503 05/11/16 0722  GLUCAP 129* 106* 111*    Meds Scheduled Meds: . acetaminophen  1,000 mg Oral Q6H   Or  . acetaminophen (TYLENOL) oral liquid 160 mg/5 mL  1,000 mg Oral Q6H  . bisacodyl  10 mg Oral Daily  . docusate sodium  200 mg Oral BID  . enoxaparin (LOVENOX) injection  40 mg Subcutaneous Daily  . folic acid  1 mg Oral q morning - 10a  . metoCLOPramide (REGLAN) injection   10 mg Intravenous Q6H  . mometasone-formoterol  2 puff Inhalation BID  . multivitamin with minerals  1 tablet Oral Daily  . pantoprazole  40 mg Oral Daily  . polyethylene glycol  17 g Oral Daily  . sotalol  80 mg Oral BID   Continuous Infusions:  PRN Meds:.fentaNYL (SUBLIMAZE) injection, levalbuterol, ondansetron (ZOFRAN) IV, oxyCODONE, potassium chloride, sodium chloride flush, traMADol, zolpidem  Xrays Dg Chest 2 View  Result Date: 05/13/2016 CLINICAL DATA:  Shortness of breath.  Pneumothorax. EXAM: CHEST  2 VIEW COMPARISON:  05/12/2016 FINDINGS: The right-sided pneumothorax has increased in size. It is estimated at 15-20%. The visceral and parietal pleura are separated by 2.2 cm at the right apex on the current study, 1.3 cm on the prior exam. The pneumothorax on the right is a hydro pneumothorax with layering pleural fluid at the right base. Known left pneumothorax.  No significant left pleural fluid. Prominent bronchovascular markings are noted bilaterally without change. No convincing pneumonia or pulmonary edema. Cardiac silhouette is normal in size. Right internal jugular central venous line is stable with its tip in the upper superior vena cava. IMPRESSION: 1. Mild increase in the size of the pneumothorax component of the right hydro pneumothorax. 2. No other change.  Electronically Signed   By: Lajean Manes M.D.   On: 05/13/2016 07:35   Dg Chest 2 View  Result Date: 05/12/2016 CLINICAL DATA:  Chest tube removal EXAM: CHEST  2 VIEW COMPARISON:  May 11, 2016 FINDINGS: Right chest tube has been removed. There is a right-sided pneumothorax with small right pleural effusion. No tension component. Central catheter tip is in the superior vena cava. There is bibasilar atelectasis. Lungs elsewhere clear. Heart size and pulmonary vascularity are normal. There is atherosclerotic calcification in the aorta. No adenopathy. No bone lesions. IMPRESSION: Right-sided pneumothorax with small right  pleural effusion following chest tube removal. No tension component. Bibasilar atelectasis. No airspace consolidation. Aortic atherosclerosis. Critical Value/emergent results were called by telephone at the time of interpretation on 05/12/2016 at 7:34 am to Dr. Ivin Poot , who verbally acknowledged these results. Electronically Signed   By: Lowella Grip III M.D.   On: 05/12/2016 07:34    Assessment/Plan: S/P Procedure(s) (LRB): VIDEO ASSISTED THORACOSCOPY (VATS)/LUNG RESECTION (Right)  1 doing well overall 2 wean O2 off 3 PNTX is in 15-20 percent range, increased from yesterday 4 will d/w MD   LOS: 5 days    GOLD,WAYNE E 05/13/2016 Patient seen and examined. He feels well and is in no distress CXR shows a slight increase in size of pneumothorax He does not need a tube right now, but I do not feel it is safe to send him home Will recheck CXR in AM  Miles C. Roxan Hockey, MD [facility] [phone]

## 2016-05-13 NOTE — Progress Notes (Signed)
Pt noted to be walking hall with wife.  Checked SPO2 on return, initially 92% but rose steadily once sitting.  Discussed using O2 while asleep, and when walking, continuing IS and coughing.  Pt and wife voiced understanding and agreement.  Will con't plan of care.

## 2016-05-13 NOTE — Progress Notes (Signed)
SATURATION QUALIFICATIONS: (This note is used to comply with regulatory documentation for home oxygen)  Patient Saturations on Room Air at Rest = 92%  Patient Saturations on Room Air while Ambulating = 87%  Patient Saturations on 2 Liters of oxygen while Ambulating = 99%  Please briefly explain why patient needs home oxygen:  Pt desats on room air with activity.

## 2016-05-14 ENCOUNTER — Inpatient Hospital Stay (HOSPITAL_COMMUNITY): Payer: Medicare Other

## 2016-05-14 DIAGNOSIS — J95811 Postprocedural pneumothorax: Secondary | ICD-10-CM

## 2016-05-14 MED ORDER — LIDOCAINE HCL (PF) 1 % IJ SOLN
30.0000 mL | Freq: Once | INTRAMUSCULAR | Status: AC
  Administered 2016-05-14: 30 mL via INTRADERMAL
  Filled 2016-05-14: qty 30

## 2016-05-14 MED ORDER — MORPHINE SULFATE (PF) 4 MG/ML IV SOLN
4.0000 mg | Freq: Once | INTRAVENOUS | Status: AC
  Administered 2016-05-14: 4 mg via INTRAVENOUS

## 2016-05-14 MED ORDER — DIPHENHYDRAMINE HCL 25 MG PO CAPS
25.0000 mg | ORAL_CAPSULE | Freq: Every evening | ORAL | Status: DC | PRN
  Administered 2016-05-14: 25 mg via ORAL
  Filled 2016-05-14: qty 1

## 2016-05-14 MED ORDER — MORPHINE SULFATE (PF) 2 MG/ML IV SOLN
INTRAVENOUS | Status: AC
  Administered 2016-05-14: 2 mg via INTRAVENOUS
  Filled 2016-05-14: qty 1

## 2016-05-14 MED ORDER — MORPHINE SULFATE (PF) 4 MG/ML IV SOLN
INTRAVENOUS | Status: AC
  Filled 2016-05-14: qty 1

## 2016-05-14 NOTE — Progress Notes (Addendum)
[address]       [place] [address]             [phone]      6 Days Post-Op Procedure(s) (LRB): VIDEO ASSISTED THORACOSCOPY (VATS)/LUNG RESECTION (Right) Subjective: Feels ok, some SOB with ambulation, currently off O2 at rest  Objective: Vital signs in last 24 hours: Temp:  [97.5 F (36.4 C)-98 F (36.7 C)] 98 F (36.7 C) (08/20 0351) Pulse Rate:  [72-80] 72 (08/20 0351) Cardiac Rhythm: Normal sinus rhythm (08/20 0728) Resp:  [18] 18 (08/20 0351) BP: (106-137)/(56-66) 125/66 (08/20 0351) SpO2:  [93 %-98 %] 96 % (08/20 0351)  Hemodynamic parameters for last 24 hours:    Intake/Output from previous day: 08/19 0701 - 08/20 0700 In: 240 [P.O.:240] Out: -  Intake/Output this shift: No intake/output data recorded.  General appearance: alert, cooperative and no distress Heart: regular rate and rhythm Lungs: clear to auscultation bilaterally Abdomen: bengn Extremities: no edema Wound: incis healing well  Lab Results: No results for input(s): WBC, HGB, HCT, PLT in the last 72 hours. BMET:  Recent Labs  05/12/16 0305  NA 141  K 5.2*  CL 105  CO2 32  GLUCOSE 111*  BUN 11  CREATININE 1.01  CALCIUM 9.0    PT/INR: No results for input(s): LABPROT, INR in the last 72 hours. ABG    Component Value Date/Time   PHART 7.353 05/09/2016 0520   HCO3 22.4 05/09/2016 0520   TCO2 23.6 05/09/2016 0520   ACIDBASEDEF 2.3 (H) 05/09/2016 0520   O2SAT 95.9 05/09/2016 0520   CBG (last 3)  No results for input(s): GLUCAP in the last 72 hours.  Meds Scheduled Meds: . bisacodyl  10 mg Oral Daily  . docusate sodium  200 mg Oral BID  . enoxaparin (LOVENOX) injection  40 mg Subcutaneous Daily  . folic acid  1 mg Oral q morning - 10a  . mometasone-formoterol  2 puff Inhalation BID  . multivitamin with minerals  1 tablet Oral Daily  . pantoprazole  40 mg Oral Daily  . polyethylene glycol  17 g Oral Daily  . sotalol  80 mg Oral BID   Continuous  Infusions:  PRN Meds:.fentaNYL (SUBLIMAZE) injection, levalbuterol, ondansetron (ZOFRAN) IV, oxyCODONE, potassium chloride, sodium chloride flush, traMADol, zolpidem  Xrays Dg Chest 2 View  Result Date: 05/14/2016 CLINICAL DATA:  Shortness of breath. EXAM: CHEST  2 VIEW COMPARISON:  One day prior FINDINGS: Right internal jugular line unchanged with tip at high SVC. Hyperinflation. Midline trachea. Mild cardiomegaly with transverse aortic atherosclerosis. Right-sided hydropneumothorax again identified. This is enlarged. The visceral pleural line projects 2.9 cm from the chest wall near the apex. Compare 2.2 cm previously. There is also an increased inferior lateral component. Mild bibasilar atelectasis. Lower lobe predominant interstitial thickening. IMPRESSION: 1. Increase in the pleural air component of the right-sided hydropneumothorax. Estimated at 25-30%. No mediastinal shift. 2. Cardiomegaly with chronic interstitial thickening and aortic atherosclerosis. These results will be called to the ordering clinician or representative by the Radiologist Assistant, and communication documented in the PACS or zVision Dashboard. Electronically Signed   By: Abigail Miyamoto M.D.   On: 05/14/2016 07:50   Dg Chest 2 View  Result Date: 05/13/2016 CLINICAL DATA:  Shortness of breath.  Pneumothorax. EXAM: CHEST  2 VIEW COMPARISON:  05/12/2016 FINDINGS: The right-sided pneumothorax has increased in size. It is estimated at 15-20%. The visceral and parietal pleura are separated by 2.2 cm at the  right apex on the current study, 1.3 cm on the prior exam. The pneumothorax on the right is a hydro pneumothorax with layering pleural fluid at the right base. Known left pneumothorax.  No significant left pleural fluid. Prominent bronchovascular markings are noted bilaterally without change. No convincing pneumonia or pulmonary edema. Cardiac silhouette is normal in size. Right internal jugular central venous line is stable with its  tip in the upper superior vena cava. IMPRESSION: 1. Mild increase in the size of the pneumothorax component of the right hydro pneumothorax. 2. No other change. Electronically Signed   By: Lajean Manes M.D.   On: 05/13/2016 07:35    Assessment/Plan: S/P Procedure(s) (LRB): VIDEO ASSISTED THORACOSCOPY (VATS)/LUNG RESECTION (Right)  1 PNTX is a little larger- will d/w with MD as may need new tube at this point      LOS: 6 days    GOLD,WAYNE E 05/14/2016  Patient seen and examined. His CXR shows a substantial increase in the size of the pneumothorax. He needs a new CT placed. I informed the patient and Mrs. Maffeo of the need to replace the tube and the risks/ benefits. They understand the risks include but are not limited to bleeding, tube malposition.  Revonda Standard Roxan Hockey, MD [facility] [phone]

## 2016-05-14 NOTE — Progress Notes (Addendum)
      [address]       [place] [address]             [phone]     BRIEF PROCEDURE NOTE    PROCEDURE: RIGHT CHEST TUBE PLACEMENT SURGEON: Urian Martenson PREP: BETADINE ANEST: 30 CC 1%LOCAL LIDOCAINE                MORPHINE SULFATE 4 MG IV TECHNIQUE; ROUTINE/ STERILE PATIENT TOLERATED WELL PCXR:  PENDING  Small air leak.  Revonda Standard Roxan Hockey, MD [facility] [phone]

## 2016-05-14 NOTE — Progress Notes (Signed)
Less than 39ms sanguinous drainage from chest tube for day shift (since tube placement).  None charted in flowsheet.

## 2016-05-15 ENCOUNTER — Inpatient Hospital Stay (HOSPITAL_COMMUNITY): Payer: Medicare Other

## 2016-05-15 MED ORDER — OXYCODONE HCL 5 MG PO TABS: 5.0000 mg | ORAL_TABLET | Freq: Four times a day (QID) | ORAL | 0 refills | Status: DC | PRN

## 2016-05-15 NOTE — Care Management Important Message (Signed)
Important Message  Patient Details  Name: Jeffrey Stephens MRN: [id number] Date of Birth: 08-04-1941   Medicare Important Message Given:  Yes    Blayne Garlick Abena 05/15/2016, 11:08 AM

## 2016-05-15 NOTE — Progress Notes (Signed)
Patient and spouse at bed side, telemetry called, right IJ removed, discharge papers understood by patient.

## 2016-05-15 NOTE — Care Management Note (Signed)
Case Management Note Marvetta Gibbons RN, BSN Unit 2W-Case Manager [phone]  Patient Details  Name: DUSTIN BUMBAUGH MRN: [id number] Date of Birth: [birth year]  Subjective/Objective:  Pt admitted s/p VATS with resection                  Action/Plan: PTA pt lived at home with SO- plan to return home- referral received for [facility] needs as pt will go home with CT to mini express- spoke with pt at bedside- choice offered for [facility] services- list provided for [facility] agencies- per pt he would like to use  [facility] for services- referral made to [place] with [facility] for HHRN-chest tube management-   Expected Discharge Date:    05/15/16              Expected Discharge Plan:  [place]  In-House Referral:     Discharge planning Services  CM Consult  Post Acute Care Choice:  Home Health Choice offered to:  Patient  DME Arranged:    DME Agency:     HH Arranged:  RN [place] Agency:  [place]  Status of Service:  Completed, signed off  If discussed at [place] of Stay Meetings, dates discussed:    Additional Comments:  Dawayne Patricia, RN 05/15/2016, 12:09 PM

## 2016-05-15 NOTE — Progress Notes (Signed)
Right IJ removed, pressure held for 47mn, patient tolerated well.

## 2016-05-15 NOTE — Progress Notes (Addendum)
      [address]       [place] [address]             [phone]      7 Days Post-Op Procedure(s) (LRB): VIDEO ASSISTED THORACOSCOPY (VATS)/LUNG RESECTION (Right) Subjective: Asking to go home. No issues overnight. No SOB.  Objective: Vital signs in last 24 hours: Temp:  [98 F (36.7 C)-98.7 F (37.1 C)] 98.2 F (36.8 C) (08/21 0509) Pulse Rate:  [63-95] 95 (08/21 0509) Cardiac Rhythm: Normal sinus rhythm (08/20 1900) Resp:  [18] 18 (08/21 0509) BP: (121-126)/(64-69) 121/66 (08/21 0509) SpO2:  [91 %-98 %] 94 % (08/21 0509)  Hemodynamic parameters for last 24 hours:    Intake/Output from previous day: 08/20 0701 - 08/21 0700 In: -  Out: 10 [Chest Tube:10] Intake/Output this shift: No intake/output data recorded.  General appearance: alert and cooperative Lungs: clear to auscultation bilaterally and decreased breath sounds in RLL Abdomen: soft, non-tender Extremities: edema 1+ pedal edema Wound: c/d/i   Lab Results: No results for input(s): WBC, HGB, HCT, PLT in the last 72 hours. BMET: No results for input(s): NA, K, CL, CO2, GLUCOSE, BUN, CREATININE, CALCIUM in the last 72 hours.  PT/INR: No results for input(s): LABPROT, INR in the last 72 hours. ABG    Component Value Date/Time   PHART 7.353 05/09/2016 0520   HCO3 22.4 05/09/2016 0520   TCO2 23.6 05/09/2016 0520   ACIDBASEDEF 2.3 (H) 05/09/2016 0520   O2SAT 95.9 05/09/2016 0520   CBG (last 3)  No results for input(s): GLUCAP in the last 72 hours.  Assessment/Plan: S/P Procedure(s) (LRB): VIDEO ASSISTED THORACOSCOPY (VATS)/LUNG RESECTION (Right)   1. S/p right VATS/lung resection. Now with small right pneumothorax. Chest tube inserted over the weekend. Changed from waterseal to suction today. Will order another chest xray later today to re-evaluate pneumothorax. Patient is asymptomatic on room air. Will discuss with attending.     LOS: 7 days    Elgie Collard 05/15/2016  Comfortable on room air, sat 94% Convert pleurovac to water seal and DC home with [facility], outpatient followup  Situation and plan of care d/w patient and wife  patient examined and medical record reviewed,agree with above note. Tharon Aquas Trigt III 05/15/2016

## 2016-05-15 NOTE — Progress Notes (Signed)
[place] evac chest tube changed to [facility] dry seal. Patient tolerated well. Spouse at bedside awaiting discharge for patient.

## 2016-05-16 ENCOUNTER — Encounter (HOSPITAL_COMMUNITY): Payer: Self-pay

## 2016-05-16 DIAGNOSIS — C3431 Malignant neoplasm of lower lobe, right bronchus or lung: Secondary | ICD-10-CM | POA: Diagnosis not present

## 2016-05-16 DIAGNOSIS — Z483 Aftercare following surgery for neoplasm: Secondary | ICD-10-CM | POA: Diagnosis not present

## 2016-05-19 ENCOUNTER — Other Ambulatory Visit: Payer: Self-pay | Admitting: Cardiothoracic Surgery

## 2016-05-19 DIAGNOSIS — C3490 Malignant neoplasm of unspecified part of unspecified bronchus or lung: Secondary | ICD-10-CM

## 2016-05-22 ENCOUNTER — Ambulatory Visit
Admission: RE | Admit: 2016-05-22 | Discharge: 2016-05-22 | Disposition: A | Discharge status: Home / Self Care | Payer: Medicare Other | Source: Ambulatory Visit | Attending: Cardiothoracic Surgery | Admitting: Cardiothoracic Surgery

## 2016-05-22 ENCOUNTER — Ambulatory Visit (INDEPENDENT_AMBULATORY_CARE_PROVIDER_SITE_OTHER): Payer: Self-pay | Admitting: Cardiothoracic Surgery

## 2016-05-22 ENCOUNTER — Encounter: Payer: Self-pay | Admitting: Cardiothoracic Surgery

## 2016-05-22 VITALS — BP 129/79 | HR 73 | Resp 20 | Ht 69.0 in | Wt 232.0 lb

## 2016-05-22 DIAGNOSIS — C3490 Malignant neoplasm of unspecified part of unspecified bronchus or lung: Secondary | ICD-10-CM

## 2016-05-22 DIAGNOSIS — D381 Neoplasm of uncertain behavior of trachea, bronchus and lung: Secondary | ICD-10-CM

## 2016-05-22 NOTE — Progress Notes (Signed)
PCP is Irven Shelling, MD Referring Provider is Lavone Orn, MD  Chief Complaint  Patient presents with  . Routine Post Op    f/u from surgery with CXR, S/P Rt VATS, Mediastinal lymph node dissection.    NOM:VEHMCNOBS postoperative visit after right VATS for wedge resection of a 1.3 cm stage IA adenocarcinoma of the right lower lobe. Final pathology indicates clean margins and negative lymph nodes. No further treatment is felt to be necessary. The patient will be scheduled for long-term surveillance scans.  The patient developed a  delayed pneumothorax before being discharged and had a chest tube placed. The patient was discharged with the chest tube in place with a mini express. There is been minimal drainage. On exam today there is no evidence of an air leak. On chest x-ray today there is a residual 15% space-pneumothorax at the right base.  The chest tube was removed today in the office under sterile technique and 2 chest tube sutures were tied. Patient's pulmonary status symptoms are stable. His oxygen saturation room air is 96%.   Past Medical History:  Diagnosis Date  . AAA (abdominal aortic aneurysm) ([place])    a. followed by Dr. Deitra Mayo - 01/2016 CT: 3.6 cm infrarenal AAA.  Marland Kitchen Abnormal nuclear cardiac imaging test 07/13/2014   a. 06/2014 MV: possible reversible inferior wall defect,no wma, nl EF, felt to be artifact.  . Basal cell carcinoma    "LLE; some on my head"  . BPH (benign prostatic hyperplasia)   . Cerebral aneurysm 1994  . Constipation   . Depression   . DVT (deep venous thrombosis) ([place]) 2001   Hx of Left leg   . Dysrhythmia    AFIB   . Frequency of urination   . GERD (gastroesophageal reflux disease)   . History of echocardiogram    a. 2D ECHO: 06/11/2014: EF 55-60%. Normal wall thickness. Indeterminate DD ( atrial flutter ). No RWMA. Mild LA dilation. Normal RV size and systolic function. No significant valvular abnormalities.  . History of  palpitations    OCCASIONAL  . Kidney stones   . Lung nodule    a. 02/2016 PET scan: intensely hypermetabolic 2.0 x 1.5 cm central RLL nodule (SUV 23).  . Paroxysmal atrial flutter ([place])    a. On eliquis and sotalol (CHA2DS2VASc = 1).  . Rheumatoid arthritis ([place])    "qwhere"  . Stroke [facility]) 1994   "when I had ruptured aneurysm in my head"  . TIA (transient ischemic attack)    1994 WITH ANEYRYSM    Past Surgical History:  Procedure Laterality Date  . BASAL CELL CARCINOMA EXCISION     "LLE; top of my head"  . CEREBRAL ANEURYSM REPAIR  1994   Hx of ruptured brain aneurysm Tx by Dr. Ellene Route  . CYSTOSCOPY W/ STONE MANIPULATION  1980's X 1  . EXCISIONAL HEMORRHOIDECTOMY  1970's  . JOINT REPLACEMENT     LT KNEE  . TOTAL KNEE ARTHROPLASTY Left 05/14/2014   Procedure: LEFT TOTAL KNEE ARTHROPLASTY;  Surgeon: Johnn Hai, MD;  Location: [facility];  Service: Orthopedics;  Laterality: Left;  Marland Kitchen VIDEO ASSISTED THORACOSCOPY (VATS)/WEDGE RESECTION Right 05/08/2016   Procedure: VIDEO ASSISTED THORACOSCOPY (VATS)/LUNG RESECTION;  Surgeon: Ivin Poot, MD;  [facility];  Service: Thoracic;  Laterality: Right;    Family History  Problem Relation Age of Onset  . Heart disease Father     Aneurysm  . Diabetes Father   . Heart attack Father   .  Heart disease Brother     Heart Disease before age 60  . Cancer Brother   . Hypertension Neg Hx   . Stroke Neg Hx     Social History Social History  Substance Use Topics  . Smoking status: Former Smoker    Packs/day: 2.50    Years: 37.00    Types: Cigarettes    Quit date: 09/25/1992  . Smokeless tobacco: Never Used  . Alcohol use No    Current Outpatient Prescriptions  Medication Sig Dispense Refill  . acetaminophen (TYLENOL) 325 MG tablet Take 2 tablets (650 mg total) by mouth every 4 (four) hours as needed for headache or mild pain.    Marland Kitchen apixaban (ELIQUIS) 5 MG TABS tablet Take 1 tablet (5 mg total) by mouth 2 (two) times daily. 60  tablet 11  . folic acid (FOLVITE) 1 MG tablet Take 1 mg by mouth every morning.     . lansoprazole (PREVACID) 30 MG capsule Take 30 mg by mouth every morning.     . Multiple Vitamins-Minerals (CENTRUM SILVER ADULT 50+) TABS Take 50 mg by mouth daily.    Marland Kitchen oxyCODONE (OXY IR/ROXICODONE) 5 MG immediate release tablet Take 1-2 tablets (5-10 mg total) by mouth every 6 (six) hours as needed for severe pain. 30 tablet 0  . sotalol (BETAPACE) 80 MG tablet Take 1 tablet (80 mg total) by mouth 2 (two) times daily. 180 tablet 3   No current facility-administered medications for this visit.     Allergies  Allergen Reactions  . No Known Allergies Other (See Comments)    Review of Systems   Patient not happy about having a chest tube at home. His cough has been minimal. The patient states he has been using his incentive spirometer. The patient states he is walking once a day at most  BP 129/79 (BP Location: Right Arm, Patient Position: Sitting, Cuff Size: Normal)   Pulse 73   Resp 20   Ht '5\' 9"'$  (1.753 m)   Wt 232 lb (105.2 kg)   SpO2 (!) 73% Comment: RA  BMI 34.26 kg/m  Physical Exam Alert and comfortable Breath sounds slightly diminished on the right Right VATS incision well-healed Some redness about the right anterior chest tube Right chest tube was pulled uneventfully. Airtight dressing applied.  Diagnostic Tests: Chest x-ray with persistent small right pneumothorax  Impression: Patient will leave the dressing intact for 48 hours. He is not yet ready to drive. He was given OpSite dressings to place over the chest tube sites he can shower in 48 hours.  Plan: Return with chest x-ray September 13.  Len Childs, MD [facility] [phone]

## 2016-05-25 ENCOUNTER — Other Ambulatory Visit: Payer: Self-pay | Admitting: *Deleted

## 2016-05-25 ENCOUNTER — Ambulatory Visit
Admission: RE | Admit: 2016-05-25 | Discharge: 2016-05-25 | Disposition: A | Discharge status: Home / Self Care | Payer: Medicare Other | Source: Ambulatory Visit | Attending: Cardiothoracic Surgery | Admitting: Cardiothoracic Surgery

## 2016-05-25 ENCOUNTER — Encounter: Payer: Self-pay | Admitting: Cardiothoracic Surgery

## 2016-05-25 ENCOUNTER — Ambulatory Visit (INDEPENDENT_AMBULATORY_CARE_PROVIDER_SITE_OTHER): Payer: Self-pay | Admitting: Cardiothoracic Surgery

## 2016-05-25 VITALS — BP 122/78 | HR 65 | Resp 18 | Ht 69.0 in | Wt 232.0 lb

## 2016-05-25 DIAGNOSIS — R0602 Shortness of breath: Secondary | ICD-10-CM

## 2016-05-25 DIAGNOSIS — Z09 Encounter for follow-up examination after completed treatment for conditions other than malignant neoplasm: Secondary | ICD-10-CM

## 2016-05-25 DIAGNOSIS — J948 Other specified pleural conditions: Secondary | ICD-10-CM

## 2016-05-25 DIAGNOSIS — C3431 Malignant neoplasm of lower lobe, right bronchus or lung: Secondary | ICD-10-CM

## 2016-05-25 MED ORDER — FUROSEMIDE 40 MG PO TABS: 40.0000 mg | ORAL_TABLET | Freq: Every day | ORAL | 0 refills | Status: DC

## 2016-05-25 NOTE — Progress Notes (Addendum)
PCP is Irven Shelling, MD Referring Provider is Lavone Orn, MD  Chief Complaint  Patient presents with  . Shortness of Breath    ON EXERTION...Marland KitchenCXR done  . Edema    OF ANKLE  . Routine Post Op    HPI:SOB with exertion O2 sat on room air at rest 93% O2 sat on room air with exercise drops to 77%   O2 sat on 2 L O2 with exercise is 90% Home O2 arranged C/o pedal edema bilat   Past Medical History:  Diagnosis Date  . AAA (abdominal aortic aneurysm) ([place])    a. followed by Dr. Deitra Mayo - 01/2016 CT: 3.6 cm infrarenal AAA.  Marland Kitchen Abnormal nuclear cardiac imaging test 07/13/2014   a. 06/2014 MV: possible reversible inferior wall defect,no wma, nl EF, felt to be artifact.  . Basal cell carcinoma    "LLE; some on my head"  . BPH (benign prostatic hyperplasia)   . Cerebral aneurysm 1994  . Constipation   . Depression   . DVT (deep venous thrombosis) ([place]) 2001   Hx of Left leg   . Dysrhythmia    AFIB   . Frequency of urination   . GERD (gastroesophageal reflux disease)   . History of echocardiogram    a. 2D ECHO: 06/11/2014: EF 55-60%. Normal wall thickness. Indeterminate DD ( atrial flutter ). No RWMA. Mild LA dilation. Normal RV size and systolic function. No significant valvular abnormalities.  . History of palpitations    OCCASIONAL  . Kidney stones   . Lung nodule    a. 02/2016 PET scan: intensely hypermetabolic 2.0 x 1.5 cm central RLL nodule (SUV 23).  . Paroxysmal atrial flutter ([place])    a. On eliquis and sotalol (CHA2DS2VASc = 1).  . Rheumatoid arthritis ([place])    "qwhere"  . Stroke [facility]) 1994   "when I had ruptured aneurysm in my head"  . TIA (transient ischemic attack)    1994 WITH ANEYRYSM    Past Surgical History:  Procedure Laterality Date  . BASAL CELL CARCINOMA EXCISION     "LLE; top of my head"  . CEREBRAL ANEURYSM REPAIR  1994   Hx of ruptured brain aneurysm Tx by Dr. Ellene Route  . CYSTOSCOPY W/ STONE MANIPULATION  1980's X 1  . EXCISIONAL  HEMORRHOIDECTOMY  1970's  . JOINT REPLACEMENT     LT KNEE  . TOTAL KNEE ARTHROPLASTY Left 05/14/2014   Procedure: LEFT TOTAL KNEE ARTHROPLASTY;  Surgeon: Johnn Hai, MD;  Location: [facility];  Service: Orthopedics;  Laterality: Left;  Marland Kitchen VIDEO ASSISTED THORACOSCOPY (VATS)/WEDGE RESECTION Right 05/08/2016   Procedure: VIDEO ASSISTED THORACOSCOPY (VATS)/LUNG RESECTION;  Surgeon: Ivin Poot, MD;  Location: [facility] OR;  Service: Thoracic;  Laterality: Right;    Family History  Problem Relation Age of Onset  . Heart disease Father     Aneurysm  . Diabetes Father   . Heart attack Father   . Heart disease Brother     Heart Disease before age 42  . Cancer Brother   . Hypertension Neg Hx   . Stroke Neg Hx     Social History Social History  Substance Use Topics  . Smoking status: Former Smoker    Packs/day: 2.50    Years: 37.00    Types: Cigarettes    Quit date: 09/25/1992  . Smokeless tobacco: Never Used  . Alcohol use No    Current Outpatient Prescriptions  Medication Sig Dispense Refill  . acetaminophen (TYLENOL) 325 MG tablet  Take 2 tablets (650 mg total) by mouth every 4 (four) hours as needed for headache or mild pain.    Marland Kitchen apixaban (ELIQUIS) 5 MG TABS tablet Take 1 tablet (5 mg total) by mouth 2 (two) times daily. 60 tablet 11  . folic acid (FOLVITE) 1 MG tablet Take 1 mg by mouth every morning.     . lansoprazole (PREVACID) 30 MG capsule Take 30 mg by mouth every morning.     . Multiple Vitamins-Minerals (CENTRUM SILVER ADULT 50+) TABS Take 50 mg by mouth daily.    Marland Kitchen oxyCODONE (OXY IR/ROXICODONE) 5 MG immediate release tablet Take 1-2 tablets (5-10 mg total) by mouth every 6 (six) hours as needed for severe pain. 30 tablet 0  . sotalol (BETAPACE) 80 MG tablet Take 1 tablet (80 mg total) by mouth 2 (two) times daily. 180 tablet 3   No current facility-administered medications for this visit.     Allergies  Allergen Reactions  . No Known Allergies Other (See Comments)     Review of Systems  sob Incisional pain better Patient taking Eliquis- doubt PE  BP 122/78   Pulse 65   Resp 18   Ht '5\' 9"'$  (1.753 m)   Wt 232 lb (105.2 kg)   SpO2 93% Comment: ON RA  BMI 34.26 kg/m  Physical Exam Incisions clean and dry nsr Breath sounds equal  1+ pedal edema bilat No calf tenderness  Diagnostic Tests: CXR with improved postop R basilar pneumothorax  10-15% Impression: Stable postop PNTX on cxr 10% Fluid retention COPD- emphysema  Plan:lasix 40 mg daily          Home O2          Return for f/u 12 days           Continue bid Eliquis, check CT r/o PE Len Childs, MD [facility] [phone]

## 2016-05-25 NOTE — Patient Instructions (Signed)
Use 2 L O2 at rest and walking and at night Banana daily with lasix

## 2016-05-26 ENCOUNTER — Ambulatory Visit
Admission: RE | Admit: 2016-05-26 | Discharge: 2016-05-26 | Disposition: A | Discharge status: Home / Self Care | Payer: Medicare Other | Source: Ambulatory Visit | Attending: Cardiothoracic Surgery | Admitting: Cardiothoracic Surgery

## 2016-05-26 ENCOUNTER — Other Ambulatory Visit: Payer: Self-pay | Admitting: Cardiothoracic Surgery

## 2016-05-26 ENCOUNTER — Other Ambulatory Visit: Payer: Self-pay | Admitting: *Deleted

## 2016-05-26 DIAGNOSIS — R0602 Shortness of breath: Secondary | ICD-10-CM

## 2016-05-26 MED ORDER — IOPAMIDOL (ISOVUE-370) INJECTION 76%
75.0000 mL | Freq: Once | INTRAVENOUS | Status: AC | PRN
  Administered 2016-05-26: 75 mL via INTRAVENOUS

## 2016-05-31 ENCOUNTER — Ambulatory Visit: Payer: Medicare Other | Admitting: Cardiothoracic Surgery

## 2016-06-02 ENCOUNTER — Encounter (HOSPITAL_COMMUNITY): Payer: Self-pay

## 2016-06-06 ENCOUNTER — Other Ambulatory Visit: Payer: Self-pay | Admitting: Cardiothoracic Surgery

## 2016-06-06 DIAGNOSIS — C3490 Malignant neoplasm of unspecified part of unspecified bronchus or lung: Secondary | ICD-10-CM

## 2016-06-07 ENCOUNTER — Encounter: Payer: Self-pay | Admitting: Cardiothoracic Surgery

## 2016-06-07 ENCOUNTER — Ambulatory Visit
Admission: RE | Admit: 2016-06-07 | Discharge: 2016-06-07 | Disposition: A | Discharge status: Home / Self Care | Payer: Medicare Other | Source: Ambulatory Visit | Attending: Cardiothoracic Surgery | Admitting: Cardiothoracic Surgery

## 2016-06-07 ENCOUNTER — Ambulatory Visit (INDEPENDENT_AMBULATORY_CARE_PROVIDER_SITE_OTHER): Payer: Self-pay | Admitting: Cardiothoracic Surgery

## 2016-06-07 VITALS — BP 115/73 | HR 74 | Resp 16 | Ht 69.0 in | Wt 232.0 lb

## 2016-06-07 DIAGNOSIS — C3431 Malignant neoplasm of lower lobe, right bronchus or lung: Secondary | ICD-10-CM

## 2016-06-07 DIAGNOSIS — C3490 Malignant neoplasm of unspecified part of unspecified bronchus or lung: Secondary | ICD-10-CM

## 2016-06-07 DIAGNOSIS — R0602 Shortness of breath: Secondary | ICD-10-CM

## 2016-06-07 DIAGNOSIS — Z09 Encounter for follow-up examination after completed treatment for conditions other than malignant neoplasm: Secondary | ICD-10-CM

## 2016-06-07 NOTE — Progress Notes (Signed)
PCP is Irven Shelling, MD Referring Provider is Lavone Orn, MD  Chief Complaint  Patient presents with  . Routine Post Op    2 wk f/u with a CXR    HPI: Patient presents for postop follow-up with chest x-ray Patient had non-small cell carcinoma-adenocarcinoma of the right lower lobe. Lymph nodes were negative and this is stage I a . He has underlying moderate to severe COPD and is now on home oxygen postop but improving with his exercise tolerance. He also wears oxygen at night. A postoperative right pneumothorax has resolved and he now has a small pleural effusion to fill the space after his pulmonary resection-- the patient is anxious to increase activity levels, drive, yard work.  The patient's postthoracotomy pain is significantly improved. Not taking oxycodone. He has had some postoperative depression-emotional lability and nightmares which will improve with time as reassured to the patient.  Molecular analysis of the patient's tumor reported negative for EGFR mutation.  Past Medical History:  Diagnosis Date  . AAA (abdominal aortic aneurysm) ([place])    a. followed by Dr. Deitra Mayo - 01/2016 CT: 3.6 cm infrarenal AAA.  Marland Kitchen Abnormal nuclear cardiac imaging test 07/13/2014   a. 06/2014 MV: possible reversible inferior wall defect,no wma, nl EF, felt to be artifact.  . Basal cell carcinoma    "LLE; some on my head"  . BPH (benign prostatic hyperplasia)   . Cerebral aneurysm 1994  . Constipation   . Depression   . DVT (deep venous thrombosis) ([place]) 2001   Hx of Left leg   . Dysrhythmia    AFIB   . Frequency of urination   . GERD (gastroesophageal reflux disease)   . History of echocardiogram    a. 2D ECHO: 06/11/2014: EF 55-60%. Normal wall thickness. Indeterminate DD ( atrial flutter ). No RWMA. Mild LA dilation. Normal RV size and systolic function. No significant valvular abnormalities.  . History of palpitations    OCCASIONAL  . Kidney stones   . Lung  nodule    a. 02/2016 PET scan: intensely hypermetabolic 2.0 x 1.5 cm central RLL nodule (SUV 23).  . Paroxysmal atrial flutter ([place])    a. On eliquis and sotalol (CHA2DS2VASc = 1).  . Rheumatoid arthritis ([place])    "qwhere"  . Stroke [facility]) 1994   "when I had ruptured aneurysm in my head"  . TIA (transient ischemic attack)    1994 WITH ANEYRYSM    Past Surgical History:  Procedure Laterality Date  . BASAL CELL CARCINOMA EXCISION     "LLE; top of my head"  . CEREBRAL ANEURYSM REPAIR  1994   Hx of ruptured brain aneurysm Tx by Dr. Ellene Route  . CYSTOSCOPY W/ STONE MANIPULATION  1980's X 1  . EXCISIONAL HEMORRHOIDECTOMY  1970's  . JOINT REPLACEMENT     LT KNEE  . TOTAL KNEE ARTHROPLASTY Left 05/14/2014   Procedure: LEFT TOTAL KNEE ARTHROPLASTY;  Surgeon: Johnn Hai, MD;  Location: [facility];  Service: Orthopedics;  Laterality: Left;  Marland Kitchen VIDEO ASSISTED THORACOSCOPY (VATS)/WEDGE RESECTION Right 05/08/2016   Procedure: VIDEO ASSISTED THORACOSCOPY (VATS)/LUNG RESECTION;  Surgeon: Ivin Poot, MD;  Location: [facility] OR;  Service: Thoracic;  Laterality: Right;    Family History  Problem Relation Age of Onset  . Heart disease Father     Aneurysm  . Diabetes Father   . Heart attack Father   . Heart disease Brother     Heart Disease before age 52  . Cancer Brother   .  Hypertension Neg Hx   . Stroke Neg Hx     Social History Social History  Substance Use Topics  . Smoking status: Former Smoker    Packs/day: 2.50    Years: 37.00    Types: Cigarettes    Quit date: 09/25/1992  . Smokeless tobacco: Never Used  . Alcohol use No    Current Outpatient Prescriptions  Medication Sig Dispense Refill  . acetaminophen (TYLENOL) 325 MG tablet Take 2 tablets (650 mg total) by mouth every 4 (four) hours as needed for headache or mild pain.    Marland Kitchen apixaban (ELIQUIS) 5 MG TABS tablet Take 1 tablet (5 mg total) by mouth 2 (two) times daily. 60 tablet 11  . folic acid (FOLVITE) 1 MG tablet Take 1 mg by  mouth every morning.     . furosemide (LASIX) 40 MG tablet Take 1 tablet (40 mg total) by mouth daily. 14 tablet 0  . lansoprazole (PREVACID) 30 MG capsule Take 30 mg by mouth every morning.     . Multiple Vitamins-Minerals (CENTRUM SILVER ADULT 50+) TABS Take 50 mg by mouth daily.    Marland Kitchen oxyCODONE (OXY IR/ROXICODONE) 5 MG immediate release tablet Take 1-2 tablets (5-10 mg total) by mouth every 6 (six) hours as needed for severe pain. 30 tablet 0  . sotalol (BETAPACE) 80 MG tablet Take 1 tablet (80 mg total) by mouth 2 (two) times daily. 180 tablet 3   No current facility-administered medications for this visit.     Allergies  Allergen Reactions  . No Known Allergies Other (See Comments)    Review of Systems  No fever Shortness of breath with exertion Improved appetite Improved sleeping habits  BP 115/73   Pulse 74   Resp 16   Ht '5\' 9"'  (1.753 m)   Wt 232 lb (105.2 kg)   SpO2 93% Comment: ON 2L O2  BMI 34.26 kg/m  Physical Exam Thoracotomy incision well-healed Breath sounds clear and equal Old chest tube site sutures removed today Heart rhythm regular No ankle edema Option saturation on room air drops less than 90% with exercise -87% Diagnostic Tests: Chest x-ray with resolution of right postop pneumothorax-space after pulmonary resection with small residual expected pleural effusion   Plan: Continue home oxygen Patient may drive short distances, shower, lift up to 15 pounds. He may walk short distances without oxygen. He needs to follow a regular walking schedule of at least 10 minutes daily to improve his exercise tolerance and to wean oxygen He'll return for follow-up visit to monitor progress in 6 weeks  Len Childs, MD [facility] [phone]

## 2016-06-13 ENCOUNTER — Encounter (HOSPITAL_COMMUNITY): Payer: Self-pay

## 2016-06-23 ENCOUNTER — Telehealth: Payer: Self-pay

## 2016-06-23 DIAGNOSIS — Z79899 Other long term (current) drug therapy: Secondary | ICD-10-CM

## 2016-06-23 DIAGNOSIS — I4892 Unspecified atrial flutter: Secondary | ICD-10-CM

## 2016-07-17 ENCOUNTER — Other Ambulatory Visit: Payer: Self-pay | Admitting: Pharmacist

## 2016-07-17 MED ORDER — APIXABAN 5 MG PO TABS: 5.0000 mg | ORAL_TABLET | Freq: Two times a day (BID) | ORAL | 1 refills | Status: DC

## 2016-07-19 ENCOUNTER — Ambulatory Visit (INDEPENDENT_AMBULATORY_CARE_PROVIDER_SITE_OTHER): Payer: Self-pay | Admitting: Physician Assistant

## 2016-07-19 ENCOUNTER — Encounter: Payer: Self-pay | Admitting: Cardiothoracic Surgery

## 2016-07-19 VITALS — BP 122/71 | HR 68 | Resp 16 | Ht 69.0 in | Wt 229.0 lb

## 2016-07-19 DIAGNOSIS — Z09 Encounter for follow-up examination after completed treatment for conditions other than malignant neoplasm: Secondary | ICD-10-CM

## 2016-07-19 DIAGNOSIS — C3431 Malignant neoplasm of lower lobe, right bronchus or lung: Secondary | ICD-10-CM

## 2016-07-19 NOTE — Progress Notes (Signed)
  HPI:  Patient returns for 6 week follow up.  He is S/P Right VATS with wedge resection of RLL lung mass.  This was positive for non-small cell carcinoma, with negative lymph nodes.  The patient has moderate COPD and required oxygen at discharge.  He was last evaluated by Dr. Prescott Gum on 06/07/2016 at which time the patient was still requiring use of oxygen.  Currently the patient states he is doing well.  He states he has not been using his oxygen for the past week.  He continues to have some mild post thoracotomy pain along the chest tube sites, which continues to improve.  He is ambulating without much difficulty and denies shortness of breath.  He has been gradually increasing activity as tolerated.   Current Outpatient Prescriptions  Medication Sig Dispense Refill  . acetaminophen (TYLENOL) 325 MG tablet Take 2 tablets (650 mg total) by mouth every 4 (four) hours as needed for headache or mild pain.    Marland Kitchen apixaban (ELIQUIS) 5 MG TABS tablet Take 1 tablet (5 mg total) by mouth 2 (two) times daily. 076 tablet 1  . folic acid (FOLVITE) 1 MG tablet Take 1 mg by mouth every morning.     . lansoprazole (PREVACID) 30 MG capsule Take 30 mg by mouth every morning.     . Multiple Vitamins-Minerals (CENTRUM SILVER ADULT 50+) TABS Take 50 mg by mouth daily.    Marland Kitchen oxyCODONE (OXY IR/ROXICODONE) 5 MG immediate release tablet Take 1-2 tablets (5-10 mg total) by mouth every 6 (six) hours as needed for severe pain. 30 tablet 0  . sotalol (BETAPACE) 80 MG tablet Take 1 tablet (80 mg total) by mouth 2 (two) times daily. 180 tablet 3   No current facility-administered medications for this visit.     Physical Exam:  BP 122/71   Pulse 68   Resp 16   Ht '5\' 9"'$  (1.753 m)   Wt 229 lb (103.9 kg)   SpO2 95% Comment: ON RA  BMI 33.82 kg/m   Gen: no apparent distress Heart: RRR Lungs: CTA bilaterally Abd: soft non-tender, non-distended Incisions: well healed  A/P:  1. Non Small Cell Carcinoma of Right Lung  Stage 1A- S/P Resection 2. COPD- has been successfully weaned off oxygen, saturations in the office were 96% with ambulation.. We will cancel his home oxygen order 3. Activity level to increase as he tolerates 4. Dispo- RTC in February with CT chest for follow up previous lung resection   Ellwood Handler, PA-C [facility] [phone]

## 2016-10-16 ENCOUNTER — Other Ambulatory Visit: Payer: Self-pay | Admitting: *Deleted

## 2016-10-16 DIAGNOSIS — R911 Solitary pulmonary nodule: Secondary | ICD-10-CM

## 2016-11-15 ENCOUNTER — Ambulatory Visit: Payer: Medicare Other | Admitting: Cardiothoracic Surgery

## 2016-11-15 ENCOUNTER — Ambulatory Visit
Admission: RE | Admit: 2016-11-15 | Discharge: 2016-11-15 | Disposition: A | Discharge status: Home / Self Care | Payer: Medicare Other | Source: Ambulatory Visit | Attending: Cardiothoracic Surgery | Admitting: Cardiothoracic Surgery

## 2016-11-15 DIAGNOSIS — R911 Solitary pulmonary nodule: Secondary | ICD-10-CM

## 2016-11-22 ENCOUNTER — Encounter: Payer: Self-pay | Admitting: Cardiothoracic Surgery

## 2016-11-22 ENCOUNTER — Ambulatory Visit (INDEPENDENT_AMBULATORY_CARE_PROVIDER_SITE_OTHER): Payer: Medicare Other | Admitting: Cardiothoracic Surgery

## 2016-11-22 VITALS — BP 125/75 | HR 62 | Resp 16 | Ht 69.0 in | Wt 230.0 lb

## 2016-11-22 DIAGNOSIS — Z09 Encounter for follow-up examination after completed treatment for conditions other than malignant neoplasm: Secondary | ICD-10-CM

## 2016-11-22 DIAGNOSIS — C3431 Malignant neoplasm of lower lobe, right bronchus or lung: Secondary | ICD-10-CM | POA: Diagnosis not present

## 2016-11-22 NOTE — Progress Notes (Signed)
PCP is Irven Shelling, MD Referring Provider is Lavone Orn, MD  Chief Complaint  Patient presents with  . Lung Cancer    4 month f/u with Chest CT ,  HX of RT VATS, wedge resection of RLL lung cancer  . Routine Post Op    HPI:The patient returns for 6 month follow-up after right VATS and wedge resection of a stage IA non-small cell carcinoma. Adenocarcinoma 1.3 cm diameter The patient has severe COPD and was not felt to be candidate for formal lobectomy. He required oxygen for an extended period postop and now is back on room air. He has no active pulmonary symptoms.  CT scans shows some postoperative changes but no evidence of recurrent cancer. Patient has changes of COPD and granulomata which are stable. The patient is a nonsmoker. Past Medical History:  Diagnosis Date  . AAA (abdominal aortic aneurysm) ([place])    a. followed by Dr. Deitra Mayo - 01/2016 CT: 3.6 cm infrarenal AAA.  Marland Kitchen Abnormal nuclear cardiac imaging test 07/13/2014   a. 06/2014 MV: possible reversible inferior wall defect,no wma, nl EF, felt to be artifact.  . Basal cell carcinoma    "LLE; some on my head"  . BPH (benign prostatic hyperplasia)   . Cerebral aneurysm 1994  . Constipation   . Depression   . DVT (deep venous thrombosis) ([place]) 2001   Hx of Left leg   . Dysrhythmia    AFIB   . Frequency of urination   . GERD (gastroesophageal reflux disease)   . History of echocardiogram    a. 2D ECHO: 06/11/2014: EF 55-60%. Normal wall thickness. Indeterminate DD ( atrial flutter ). No RWMA. Mild LA dilation. Normal RV size and systolic function. No significant valvular abnormalities.  . History of palpitations    OCCASIONAL  . Kidney stones   . Lung nodule    a. 02/2016 PET scan: intensely hypermetabolic 2.0 x 1.5 cm central RLL nodule (SUV 23).  . Paroxysmal atrial flutter ([place])    a. On eliquis and sotalol (CHA2DS2VASc = 1).  . Rheumatoid arthritis ([place])    "qwhere"  . Stroke [facility]) 1994   "when  I had ruptured aneurysm in my head"  . TIA (transient ischemic attack)    1994 WITH ANEYRYSM    Past Surgical History:  Procedure Laterality Date  . BASAL CELL CARCINOMA EXCISION     "LLE; top of my head"  . CEREBRAL ANEURYSM REPAIR  1994   Hx of ruptured brain aneurysm Tx by Dr. Ellene Route  . CYSTOSCOPY W/ STONE MANIPULATION  1980's X 1  . EXCISIONAL HEMORRHOIDECTOMY  1970's  . JOINT REPLACEMENT     LT KNEE  . TOTAL KNEE ARTHROPLASTY Left 05/14/2014   Procedure: LEFT TOTAL KNEE ARTHROPLASTY;  Surgeon: Johnn Hai, MD;  Location: [facility];  Service: Orthopedics;  Laterality: Left;  Marland Kitchen VIDEO ASSISTED THORACOSCOPY (VATS)/WEDGE RESECTION Right 05/08/2016   Procedure: VIDEO ASSISTED THORACOSCOPY (VATS)/LUNG RESECTION;  Surgeon: Ivin Poot, MD;  Location: [facility] OR;  Service: Thoracic;  Laterality: Right;    Family History  Problem Relation Age of Onset  . Heart disease Father     Aneurysm  . Diabetes Father   . Heart attack Father   . Heart disease Brother     Heart Disease before age 33  . Cancer Brother   . Hypertension Neg Hx   . Stroke Neg Hx     Social History Social History  Substance Use Topics  . Smoking status:  Former Smoker    Packs/day: 2.50    Years: 37.00    Types: Cigarettes    Quit date: 09/25/1992  . Smokeless tobacco: Never Used  . Alcohol use No    Current Outpatient Prescriptions  Medication Sig Dispense Refill  . acetaminophen (TYLENOL) 325 MG tablet Take 2 tablets (650 mg total) by mouth every 4 (four) hours as needed for headache or mild pain.    Marland Kitchen apixaban (ELIQUIS) 5 MG TABS tablet Take 1 tablet (5 mg total) by mouth 2 (two) times daily. 800 tablet 1  . folic acid (FOLVITE) 1 MG tablet Take 1 mg by mouth every morning.     . lansoprazole (PREVACID) 30 MG capsule Take 30 mg by mouth every morning.     . Multiple Vitamins-Minerals (CENTRUM SILVER ADULT 50+) TABS Take 50 mg by mouth daily.    Marland Kitchen oxyCODONE (OXY IR/ROXICODONE) 5 MG immediate release tablet  Take 1-2 tablets (5-10 mg total) by mouth every 6 (six) hours as needed for severe pain. 30 tablet 0  . sotalol (BETAPACE) 80 MG tablet Take 1 tablet (80 mg total) by mouth 2 (two) times daily. 180 tablet 3   No current facility-administered medications for this visit.     Allergies  Allergen Reactions  . No Known Allergies Other (See Comments)    Review of Systems  No weight change No fever productive cough Mild shortness of breath with exertion but improved exercise tolerance Not using home oxygen Postoperative VATS incision with mild tenderness No palpitations or angina No ankle edema No presyncope BP 125/75 (BP Location: Left Arm, Patient Position: Sitting, Cuff Size: Large)   Pulse 62   Resp 16   Ht '5\' 9"'$  (1.753 m)   Wt 230 lb (104.3 kg)   SpO2 93% Comment: ON RA  BMI 33.97 kg/m  Physical Exam      Exam    General- alert and comfortable   Lungs- clear without rales, wheezes   Cor- regular rate and rhythm, no murmur , gallop   Abdomen- soft, non-tender   Extremities - warm, non-tender, minimal edema   Neuro- oriented, appropriate, no focal weakness   Diagnostic Tests: CT scan personally reviewed and counseled with patient This shows some postoperative changes but no evidence of recurrent cancer or new lesions  Impression: 6 months postop right VATS and right upper lobe wedge resection without evidence recurrent adenocarcinoma 4.0 cm ascending aortic aneurysm-asymptomatic Plan: Return at one year postop [August 2018] with CT scan for surveillance of recurrence  Len Childs, MD [facility] [phone]

## 2016-12-20 ENCOUNTER — Ambulatory Visit: Payer: Medicare Other | Admitting: Cardiothoracic Surgery

## 2016-12-25 ENCOUNTER — Other Ambulatory Visit: Payer: Self-pay | Admitting: Cardiovascular Disease

## 2016-12-25 NOTE — Telephone Encounter (Signed)
Rx request sent to pharmacy.  

## 2016-12-26 NOTE — Telephone Encounter (Signed)
Rx request sent to pharmacy.  

## 2017-01-18 ENCOUNTER — Encounter: Payer: Self-pay | Admitting: Cardiovascular Disease

## 2017-01-18 ENCOUNTER — Ambulatory Visit (INDEPENDENT_AMBULATORY_CARE_PROVIDER_SITE_OTHER): Payer: Medicare Other | Admitting: Cardiovascular Disease

## 2017-01-18 VITALS — BP 144/70 | HR 69 | Ht 69.0 in | Wt 236.0 lb

## 2017-01-18 DIAGNOSIS — J432 Centrilobular emphysema: Secondary | ICD-10-CM | POA: Diagnosis not present

## 2017-01-18 DIAGNOSIS — I2510 Atherosclerotic heart disease of native coronary artery without angina pectoris: Secondary | ICD-10-CM

## 2017-01-18 DIAGNOSIS — R55 Syncope and collapse: Secondary | ICD-10-CM | POA: Diagnosis not present

## 2017-01-18 DIAGNOSIS — I712 Thoracic aortic aneurysm, without rupture: Secondary | ICD-10-CM

## 2017-01-18 DIAGNOSIS — I714 Abdominal aortic aneurysm, without rupture, unspecified: Secondary | ICD-10-CM

## 2017-01-18 DIAGNOSIS — Z7901 Long term (current) use of anticoagulants: Secondary | ICD-10-CM

## 2017-01-18 DIAGNOSIS — I4892 Unspecified atrial flutter: Secondary | ICD-10-CM

## 2017-01-18 DIAGNOSIS — I7121 Aneurysm of the ascending aorta, without rupture: Secondary | ICD-10-CM

## 2017-01-18 NOTE — Progress Notes (Signed)
Patient ID: Jeffrey Stephens, male   DOB: 1941-08-21, 76 y.o.   MRN: [id number]    Cardiology Office Note    Date:  01/18/2017   ID:  Jeffrey, Stephens [birth year], MRN [id number]  PCP:  Irven Shelling, MD  Cardiologist:   Sanda Klein, MD   Chief Complaint  Patient presents with  . Follow-up  . Shortness of Breath  . Headache    History of Present Illness:  Jeffrey Stephens is a 76 y.o. male with a history of recurrent persistent atrial flutter that responded well to sotalol therapy. He returns with complaints of dizziness and presyncope.  Over the last several months he has noted recurrent episodes of dizziness, feeling like he will almost pass out and dyspnea. Most commonly, these occur when he walks from the garage to the front door, roughly 20-25 yards. The symptoms do not occur consistently and for the last couple of days they have not happened. They never occur while at rest. When he has had one of these spells he will check his oxygen level to pulse oximeter and routinely gets readings of 95-96%. He has not paid as much attention to the heart rate but believes it's in the 70s. He does not have the typical palpitations that are previously associated with atrial flutter. He denies angina pectoris either rest or with activity and has no chest discomfort when he has the near syncopal events. Usually by the time he gets to do these recordings see already feels better. The symptoms do not occur when he gets out of bed in the morning and did not sound like typical orthostatic hypotension. As mentioned, he has "good days and bad days".  Not long ago, we had to reduce his dose of sotalol due to symptoms of bradycardia. We discussed the potential need for pacemaker at the time, but he declined.  He is appropriately anticoagulated with Xarelto and has not had any bleeding problems.   Roughly 8 months ago he underwent resection of a right lower lobe primary lung adenocarcinoma with  curative intent. He has not required radiation or chemotherapy and follow-up CT has shown no recurrent illness. He does have moderate to severe COPD and used oxygen postoperatively for a while, he still wears oxygen at night. Workup incidentally showed evidence of a stable 4 cm ascending aortic aneurysm, and he has a small infrarenal abdominal aortic aneurysm as well.  He initially presented with paroxysmal atrial flutter in August 2015 in the setting of recent left knee surgery. There was evidence of normal left ventricular systolic function by both echo and nuclear scintigraphy, he did not have significant valvular disease in his left atrium was only mildly dilated. He has calcified coronaries on CT of the chest but has never had angina pectoralis. A nuclear study was interpreted as showing possible inferior wall reversible defect (question combination of diaphragmatic attenuation artifact and excessive intestinal activity). He was hospitalized in November 2015 with atrial flutter with rapid ventricular response and converted on treatment with sotalol. As far as we know he has never had any embolic events. He has a remote history of deep venous thrombosis of the lower extremities, brain aneurysm complicated by secondary hemorrhagic stroke but treated with successful clipping, AAA. He is scheduled to have repeat imaging of a small abdominal aortic aneurysm at [facility] in November.  Past Medical History:  Diagnosis Date  . AAA (abdominal aortic aneurysm) ([place])    a. followed by Dr. Deitra Mayo - 01/2016 CT:  3.6 cm infrarenal AAA.  Marland Kitchen Abnormal nuclear cardiac imaging test 07/13/2014   a. 06/2014 MV: possible reversible inferior wall defect,no wma, nl EF, felt to be artifact.  . Basal cell carcinoma    "LLE; some on my head"  . BPH (benign prostatic hyperplasia)   . Cerebral aneurysm 1994  . Constipation   . Depression   . DVT (deep venous thrombosis) ([place]) 2001   Hx of Left leg   . Dysrhythmia     AFIB   . Frequency of urination   . GERD (gastroesophageal reflux disease)   . History of echocardiogram    a. 2D ECHO: 06/11/2014: EF 55-60%. Normal wall thickness. Indeterminate DD ( atrial flutter ). No RWMA. Mild LA dilation. Normal RV size and systolic function. No significant valvular abnormalities.  . History of palpitations    OCCASIONAL  . Kidney stones   . Lung nodule    a. 02/2016 PET scan: intensely hypermetabolic 2.0 x 1.5 cm central RLL nodule (SUV 23).  . Paroxysmal atrial flutter ([place])    a. On eliquis and sotalol (CHA2DS2VASc = 1).  . Rheumatoid arthritis ([place])    "qwhere"  . Stroke [facility]) 1994   "when I had ruptured aneurysm in my head"  . TIA (transient ischemic attack)    1994 WITH ANEYRYSM    Past Surgical History:  Procedure Laterality Date  . BASAL CELL CARCINOMA EXCISION     "LLE; top of my head"  . CEREBRAL ANEURYSM REPAIR  1994   Hx of ruptured brain aneurysm Tx by Dr. Ellene Route  . CYSTOSCOPY W/ STONE MANIPULATION  1980's X 1  . EXCISIONAL HEMORRHOIDECTOMY  1970's  . JOINT REPLACEMENT     LT KNEE  . TOTAL KNEE ARTHROPLASTY Left 05/14/2014   Procedure: LEFT TOTAL KNEE ARTHROPLASTY;  Surgeon: Johnn Hai, MD;  Location: [facility];  Service: Orthopedics;  Laterality: Left;  Marland Kitchen VIDEO ASSISTED THORACOSCOPY (VATS)/WEDGE RESECTION Right 05/08/2016   Procedure: VIDEO ASSISTED THORACOSCOPY (VATS)/LUNG RESECTION;  Surgeon: Ivin Poot, MD;  Location: [facility] OR;  Service: Thoracic;  Laterality: Right;    Current Medications: Outpatient Medications Prior to Visit  Medication Sig Dispense Refill  . acetaminophen (TYLENOL) 325 MG tablet Take 2 tablets (650 mg total) by mouth every 4 (four) hours as needed for headache or mild pain.    Marland Kitchen apixaban (ELIQUIS) 5 MG TABS tablet Take 1 tablet (5 mg total) by mouth 2 (two) times daily. 093 tablet 1  . folic acid (FOLVITE) 1 MG tablet Take 1 mg by mouth every morning.     . lansoprazole (PREVACID) 30 MG capsule Take 30 mg by  mouth every morning.     . Multiple Vitamins-Minerals (CENTRUM SILVER ADULT 50+) TABS Take 50 mg by mouth daily.    Marland Kitchen oxyCODONE (OXY IR/ROXICODONE) 5 MG immediate release tablet Take 1-2 tablets (5-10 mg total) by mouth every 6 (six) hours as needed for severe pain. 30 tablet 0  . sotalol (BETAPACE) 80 MG tablet Take 1 tablet (80 mg total) by mouth 2 (two) times daily. Please schedule appointment for refills. 180 tablet 0  . sotalol (BETAPACE) 80 MG tablet Take 1 tablet (80 mg total) by mouth 2 (two) times daily. Please schedule appointment for refills. 60 tablet 0   No facility-administered medications prior to visit.      Allergies:   No known allergies   Social History   Social History  . Marital status: Widowed    Spouse name: N/A  .  Number of children: N/A  . Years of education: N/A   Social History Main Topics  . Smoking status: Former Smoker    Packs/day: 2.50    Years: 37.00    Types: Cigarettes    Quit date: 09/25/1992  . Smokeless tobacco: Never Used  . Alcohol use No  . Drug use: No  . Sexual activity: Not Currently   Other Topics Concern  . None   Social History Narrative  . None     Family History:  The patient's family history includes Cancer in his brother; Diabetes in his father; Heart attack in his father; Heart disease in his brother and father.   ROS:   Please see the history of present illness.    ROS All other systems reviewed and are negative.   PHYSICAL EXAM:   VS:  BP (!) 144/70   Pulse 69   Ht '5\' 9"'$  (1.753 m)   Wt 107 kg (236 lb)   BMI 34.85 kg/m    GEN: Well nourished, well developed, in no acute distress  HEENT: normal  Neck: no JVD, carotid bruits, or masses Cardiac: Bradycardia , RRR; no murmurs, rubs, or gallops,no edema  Respiratory:  clear to auscultation bilaterally, normal work of breathing GI: soft, nontender, nondistended, + BS MS: no deformity or atrophy  Skin: warm and dry, no rash Neuro:  Alert and Oriented x 3, Strength  and sensation are intact Psych: euthymic mood, full affect  Wt Readings from Last 3 Encounters:  01/18/17 107 kg (236 lb)  11/22/16 104.3 kg (230 lb)  07/19/16 103.9 kg (229 lb)      Studies/Labs Reviewed:   EKG:  EKG is ordered today.  The ekg ordered today demonstrates Sinus Rhythm with first-degree AV block (PR interval 212 ms), a few PVCs, monomorphic, normal QT interval (QTC 426 ms).   Lipid Panel    Component Value Date/Time   CHOL 95 06/12/2014 0050   TRIG 80 06/12/2014 0050   HDL 32 (L) 06/12/2014 0050   CHOLHDL 3.0 06/12/2014 0050   VLDL 16 06/12/2014 0050   LDLCALC 47 06/12/2014 0050     ASSESSMENT:    1. Near syncope   2. Atrial flutter, paroxysmal ([place])   3. Long term current use of anticoagulant   4. Coronary artery calcification seen on CT scan   5. Abdominal aortic aneurysm (AAA) without rupture ([place])   6. Ascending aortic aneurysm ([place])   7. Centrilobular emphysema ([place])      PLAN:  In order of problems listed above:  1. Near syncope : I recommended that he wear a 14 day event monitor and we even discussed implantable loop recorder is. However she would simply like to try to discontinue the sotalol first. He will wean this over a couple of days and will call back in a couple of weeks to tell us how he is feeling. If he continues to have episodes of near syncope, I would insist that he wear some type of monitor  2. AFlutter: His is likely to recur with discontinuation of the sotalol. Discussed option for resumption of antiarrhythmics and pacemaker implantation or EP study and ablation as another option. His electrocardiograms seem to consistently show atrial flutter, no atrial fibrillation. Ablation may not take away the eventual need for a pacemaker.  3. Eliquis: CHADS Vasc 3 (age 79, vascular disease). Bleeding complications. Continue. 4. Coronary Ca: No evidence of ischemia on functional study. Does not have angina pectoris 5. AAA: Ultrasound in November  2016 - small, only measures 3.6 cm in maximum diameter, mildly increased compared to 2015 (3.0 in 2012).  6. ATAA: 4 cm ascending aneurysm monitored by CT during f/u for lung neoplasm. 7. COPD: If no correlation between heart rhythm and near syncopal events is identified, need to consider that he may be having episodes of hypoxia. However, the inconsistent and a little unpredictable nature of his symptoms suggests something more erratic, such as cardiac arrhythmia.  Return in 6 months with renal function/electrolytes, repeat ECG. Call us sooner if he develops more severe symptoms or bradycardia, recurrent palpitations or any bleeding problems.  Medication Adjustments/Labs and Tests Ordered: Current medicines are reviewed at length with the patient today.  Concerns regarding medicines are outlined above.  Medication changes, Labs and Tests ordered today are listed in the Patient Instructions below. Patient Instructions  Dr [name] has recommended making the following medication changes: 1. WEAN OFF Sotalol - take a 0.5 tablet twice daily for 2 days, the STOP all together  Please call the office in 10 days - let us know if you've had continued spells  Your physician recommends that you schedule a follow-up appointment in 6 months. You will receive a reminder letter in the mail two months in advance. If you don't receive a letter, please call our office to schedule the follow-up appointment.  If you need a refill on your cardiac medications before your next appointment, please call your pharmacy.    Signed, Sanda Klein, MD  01/18/2017 1:26 PM    [facility] [place] [address], [place], [place]  [address] Phone: [phone]; Fax: [phone]

## 2017-01-18 NOTE — Patient Instructions (Addendum)
Dr [name] has recommended making the following medication changes: 1. WEAN OFF Sotalol - take a 0.5 tablet twice daily for 2 days, the STOP all together  Please call the office in 10 days - let us know if you've had continued spells  Your physician recommends that you schedule a follow-up appointment in 6 months. You will receive a reminder letter in the mail two months in advance. If you don't receive a letter, please call our office to schedule the follow-up appointment.  If you need a refill on your cardiac medications before your next appointment, please call your pharmacy.

## 2017-01-26 NOTE — Telephone Encounter (Signed)
Opened in error. Patient already had labs completed.

## 2017-01-29 ENCOUNTER — Telehealth: Payer: Self-pay | Admitting: Cardiovascular Disease

## 2017-01-29 NOTE — Telephone Encounter (Signed)
Thank you :)

## 2017-01-29 NOTE — Telephone Encounter (Signed)
Returned call to patient  He was instructed to wean off sotalol at last visit and then call after 10 days.  He has not had any more presyncopal/dizzy episodes since this medication change Informed him I would notify MD

## 2017-01-29 NOTE — Telephone Encounter (Signed)
New message    Pt was advised to call 10 days after his last appt and he is not sure why. He is calling us as requested. Would like a call back

## 2017-02-04 ENCOUNTER — Other Ambulatory Visit: Payer: Self-pay | Admitting: Cardiovascular Disease

## 2017-02-09 ENCOUNTER — Encounter: Payer: Self-pay | Admitting: Vascular Surgery

## 2017-02-15 ENCOUNTER — Encounter (HOSPITAL_COMMUNITY): Payer: Self-pay | Admitting: Emergency Medicine

## 2017-02-15 ENCOUNTER — Ambulatory Visit (HOSPITAL_COMMUNITY)
Admission: EM | Admit: 2017-02-15 | Discharge: 2017-02-15 | Disposition: A | Discharge status: Home / Self Care | Payer: Medicare Other | Attending: Family Medicine | Admitting: Family Medicine

## 2017-02-15 DIAGNOSIS — J301 Allergic rhinitis due to pollen: Secondary | ICD-10-CM

## 2017-02-15 DIAGNOSIS — J309 Allergic rhinitis, unspecified: Secondary | ICD-10-CM

## 2017-02-15 MED ORDER — IPRATROPIUM BROMIDE 0.06 % NA SOLN: 2.0000 | Freq: Four times a day (QID) | NASAL | 12 refills | Status: DC

## 2017-02-15 MED ORDER — PREDNISONE 50 MG PO TABS: ORAL_TABLET | ORAL | 0 refills | Status: DC

## 2017-02-15 MED ORDER — TRIAMCINOLONE ACETONIDE 40 MG/ML IJ SUSP
INTRAMUSCULAR | Status: AC
  Filled 2017-02-15: qty 1

## 2017-02-15 MED ORDER — TRIAMCINOLONE ACETONIDE 40 MG/ML IJ SUSP
40.0000 mg | Freq: Once | INTRAMUSCULAR | Status: AC
  Administered 2017-02-15: 40 mg via INTRAMUSCULAR

## 2017-02-15 NOTE — ED Provider Notes (Signed)
CSN: [id number]     Arrival date & time 02/15/17  1158 History   First MD Initiated Contact with Patient 02/15/17 1305     Chief Complaint  Patient presents with  . URI  . Allergies   (Consider location/radiation/quality/duration/timing/severity/associated sxs/prior Treatment) 76 year old male states he is having a lot of troubles with this environmental allergies. She is complaining of runny nose, PND, cough, frontal headache. He has taken Claritin and Sudafed PE with minimal affect.      Past Medical History:  Diagnosis Date  . AAA (abdominal aortic aneurysm) ([place])    a. followed by Dr. Deitra Mayo - 01/2016 CT: 3.6 cm infrarenal AAA.  Marland Kitchen Abnormal nuclear cardiac imaging test 07/13/2014   a. 06/2014 MV: possible reversible inferior wall defect,no wma, nl EF, felt to be artifact.  . Basal cell carcinoma    "LLE; some on my head"  . BPH (benign prostatic hyperplasia)   . Cerebral aneurysm 1994  . Constipation   . Depression   . DVT (deep venous thrombosis) ([place]) 2001   Hx of Left leg   . Dysrhythmia    AFIB   . Frequency of urination   . GERD (gastroesophageal reflux disease)   . History of echocardiogram    a. 2D ECHO: 06/11/2014: EF 55-60%. Normal wall thickness. Indeterminate DD ( atrial flutter ). No RWMA. Mild LA dilation. Normal RV size and systolic function. No significant valvular abnormalities.  . History of palpitations    OCCASIONAL  . Kidney stones   . Lung nodule    a. 02/2016 PET scan: intensely hypermetabolic 2.0 x 1.5 cm central RLL nodule (SUV 23).  . Paroxysmal atrial flutter ([place])    a. On eliquis and sotalol (CHA2DS2VASc = 1).  . Rheumatoid arthritis ([place])    "qwhere"  . Stroke [facility]) 1994   "when I had ruptured aneurysm in my head"  . TIA (transient ischemic attack)    1994 WITH ANEYRYSM   Past Surgical History:  Procedure Laterality Date  . BASAL CELL CARCINOMA EXCISION     "LLE; top of my head"  . CEREBRAL ANEURYSM REPAIR  1994   Hx of  ruptured brain aneurysm Tx by Dr. Ellene Route  . CYSTOSCOPY W/ STONE MANIPULATION  1980's X 1  . EXCISIONAL HEMORRHOIDECTOMY  1970's  . JOINT REPLACEMENT     LT KNEE  . TOTAL KNEE ARTHROPLASTY Left 05/14/2014   Procedure: LEFT TOTAL KNEE ARTHROPLASTY;  Surgeon: Johnn Hai, MD;  Location: [facility] ORS;  Service: Orthopedics;  Laterality: Left;  Marland Kitchen VIDEO ASSISTED THORACOSCOPY (VATS)/WEDGE RESECTION Right 05/08/2016   Procedure: VIDEO ASSISTED THORACOSCOPY (VATS)/LUNG RESECTION;  Surgeon: Ivin Poot, MD;  Location: [facility] OR;  Service: Thoracic;  Laterality: Right;   Family History  Problem Relation Age of Onset  . Heart disease Father        Aneurysm  . Diabetes Father   . Heart attack Father   . Heart disease Brother        Heart Disease before age 40  . Cancer Brother   . Hypertension Neg Hx   . Stroke Neg Hx    Social History  Substance Use Topics  . Smoking status: Former Smoker    Packs/day: 2.50    Years: 37.00    Types: Cigarettes    Quit date: 09/25/1992  . Smokeless tobacco: Never Used  . Alcohol use No    Review of Systems  Constitutional: Positive for activity change. Negative for diaphoresis, fatigue and fever.  HENT: Positive for congestion, postnasal drip and rhinorrhea. Negative for ear pain, facial swelling, sore throat and trouble swallowing.   Eyes: Negative for pain, discharge and redness.  Respiratory: Positive for cough. Negative for chest tightness and shortness of breath.   Cardiovascular: Negative.   Gastrointestinal: Negative.   Musculoskeletal: Negative.  Negative for neck pain and neck stiffness.  Neurological: Positive for headaches.  All other systems reviewed and are negative.   Allergies  No known allergies  Home Medications   Prior to Admission medications   Medication Sig Start Date End Date Taking? Authorizing Provider  ELIQUIS 5 MG TABS tablet TAKE 1 TABLET BY MOUTH TWICE A DAY 02/05/17  Yes Croitoru, Mihai, MD  folic acid (FOLVITE) 1 MG  tablet Take 1 mg by mouth every morning.    Yes [provider]  lansoprazole (PREVACID) 30 MG capsule Take 30 mg by mouth every morning.    Yes [provider]  methotrexate (RHEUMATREX) 5 MG tablet Take 5 mg by mouth once a week. Caution: Chemotherapy. Protect from light.   Yes [provider]  Multiple Vitamins-Minerals (CENTRUM SILVER ADULT 50+) TABS Take 50 mg by mouth daily.   Yes [provider]  acetaminophen (TYLENOL) 325 MG tablet Take 2 tablets (650 mg total) by mouth every 4 (four) hours as needed for headache or mild pain. 08/14/14   [name], NP  ipratropium (ATROVENT) 0.06 % nasal spray Place 2 sprays into both nostrils 4 (four) times daily. 02/15/17   Janne Napoleon, NP  oxyCODONE (OXY IR/ROXICODONE) 5 MG immediate release tablet Take 1-2 tablets (5-10 mg total) by mouth every 6 (six) hours as needed for severe pain. 05/15/16   Gold, Wayne E, PA-C  predniSONE (DELTASONE) 50 MG tablet 1 tab po daily for 6 days. Take with food. Start 02/16/17. 02/15/17   Janne Napoleon, NP   Meds Ordered and Administered this Visit   Medications  triamcinolone acetonide (KENALOG-40) injection 40 mg (40 mg Intramuscular Given 02/15/17 1324)    BP 120/75 (BP Location: Left Arm)   Pulse 90   Temp 97.9 F (36.6 C) (Oral)   Resp 16   SpO2 96%  No data found.   Physical Exam  Constitutional: He is oriented to person, place, and time. He appears well-developed and well-nourished. No distress.  HENT:  Mouth/Throat: No oropharyngeal exudate.  Bilateral TMs mildly retracted. No erythema or effusion. Oropharynx with minor erythema, moderate amount of thick clear PND and cobblestoning.  Eyes: Conjunctivae and EOM are normal.  Neck: Normal range of motion. Neck supple.  Cardiovascular: Normal rate, regular rhythm and normal heart sounds.   Pulmonary/Chest: Effort normal and breath sounds normal. No respiratory distress. He has no wheezes.  Musculoskeletal: Normal  range of motion. He exhibits no edema.  Lymphadenopathy:    He has no cervical adenopathy.  Neurological: He is alert and oriented to person, place, and time.  Skin: Skin is warm and dry. No rash noted.  Psychiatric: He has a normal mood and affect.  Nursing note and vitals reviewed.   Urgent Care Course     Procedures (including critical care time)  Labs Review Labs Reviewed - No data to display  Imaging Review No results found.   Visual Acuity Review  Right Eye Distance:   Left Eye Distance:   Bilateral Distance:    Right Eye Near:   Left Eye Near:    Bilateral Near:         MDM  1. Seasonal allergic rhinitis due to pollen   2. Allergic sinusitis      Sudafed PE 10 mg every 4 to 6 hours as needed for congestion Allegra or Zyrtec daily as needed for drainage and runny nose. For stronger antihistamine may take Chlor-Trimeton 2 to 4 mg every 4 to 6 hours, may cause drowsiness. May obtain the generic form. Saline nasal spray used frequently. Tylenol every 4 hours as needed for pain, discomfort or fever. Drink plenty of fluids and stay well-hydrated. Flonase or Rhinocort nasal spray daily Take the prednisone as directed. Take it with food. Meds ordered this encounter  Medications  . methotrexate (RHEUMATREX) 5 MG tablet    Sig: Take 5 mg by mouth once a week. Caution: Chemotherapy. Protect from light.  . triamcinolone acetonide (KENALOG-40) injection 40 mg  . ipratropium (ATROVENT) 0.06 % nasal spray    Sig: Place 2 sprays into both nostrils 4 (four) times daily.    Dispense:  15 mL    Refill:  12    Order Specific Question:   Supervising Provider    Answer:   Robyn [facility] [[id number]]  . predniSONE (DELTASONE) 50 MG tablet    Sig: 1 tab po daily for 6 days. Take with food. Start 02/16/17.    Dispense:  6 tablet    Refill:  0    Order Specific Question:   Supervising Provider    Answer:   Robyn [facility] [[id number]]  MTX by hisotric provider only      Janne Napoleon, NP 02/15/17 1329

## 2017-02-15 NOTE — ED Triage Notes (Signed)
Pt c/o cold/allergies onset 1 week associated w/nasal congestion/drainage, prod cough, sneezing, watery/itchy eyes, HA  Denies fevers, chills  Taking OTC Sudafed and Claritin w/no relief.   A&O x4... NAD... Ambulatory

## 2017-02-15 NOTE — Discharge Instructions (Signed)
° °  Sudafed PE 10 mg every 4 to 6 hours as needed for congestion Allegra or Zyrtec daily as needed for drainage and runny nose. For stronger antihistamine may take Chlor-Trimeton 2 to 4 mg every 4 to 6 hours, may cause drowsiness. May obtain the generic form. Saline nasal spray used frequently. Tylenol every 4 hours as needed for pain, discomfort or fever. Drink plenty of fluids and stay well-hydrated. Flonase or Rhinocort nasal spray daily Take the prednisone as directed. Take it with food.

## 2017-02-20 ENCOUNTER — Other Ambulatory Visit: Payer: Self-pay

## 2017-02-20 DIAGNOSIS — I714 Abdominal aortic aneurysm, without rupture, unspecified: Secondary | ICD-10-CM

## 2017-02-21 ENCOUNTER — Encounter: Payer: Self-pay | Admitting: Vascular Surgery

## 2017-02-21 ENCOUNTER — Ambulatory Visit (INDEPENDENT_AMBULATORY_CARE_PROVIDER_SITE_OTHER): Payer: Medicare Other | Admitting: Vascular Surgery

## 2017-02-21 ENCOUNTER — Ambulatory Visit (HOSPITAL_COMMUNITY)
Admission: RE | Admit: 2017-02-21 | Discharge: 2017-02-21 | Disposition: A | Discharge status: Home / Self Care | Payer: Medicare Other | Source: Ambulatory Visit | Attending: Vascular Surgery | Admitting: Vascular Surgery

## 2017-02-21 VITALS — BP 116/71 | HR 79 | Temp 97.2°F | Resp 18 | Ht 69.0 in | Wt 231.0 lb

## 2017-02-21 DIAGNOSIS — I714 Abdominal aortic aneurysm, without rupture, unspecified: Secondary | ICD-10-CM

## 2017-02-21 NOTE — Progress Notes (Signed)
Patient name: Jeffrey Stephens MRN: [id number] DOB: 09-04-1941 Sex: male  REASON FOR VISIT:    Follow up of abdominal aortic aneurysm.  HPI:   Jeffrey Stephens is a 76 y.o. male who I have been following with an abdominal aortic aneurysm. I last saw him on 08/25/2015. At that time, the maximal diameter of his aneurysm by duplex was 3.6 cm. I set him up for a 18 month follow up visit.  Since I saw him last, he denies any history of abdominal pain or back pain. He did have a lung cancer resected in August 2017 and did not require radiation therapy or chemotherapy. There have been no other changes in his medical history.  Past Medical History:  Diagnosis Date  . AAA (abdominal aortic aneurysm) ([employer])    a. followed by Dr. Deitra Mayo - 01/2016 CT: 3.6 cm infrarenal AAA.  Marland Kitchen Abnormal nuclear cardiac imaging test 07/13/2014   a. 06/2014 MV: possible reversible inferior wall defect,no wma, nl EF, felt to be artifact.  . Basal cell carcinoma    "LLE; some on my head"  . BPH (benign prostatic hyperplasia)   . Cerebral aneurysm 1994  . Constipation   . Depression   . DVT (deep venous thrombosis) ([place]) 2001   Hx of Left leg   . Dysrhythmia    AFIB   . Frequency of urination   . GERD (gastroesophageal reflux disease)   . History of echocardiogram    a. 2D ECHO: 06/11/2014: EF 55-60%. Normal wall thickness. Indeterminate DD ( atrial flutter ). No RWMA. Mild LA dilation. Normal RV size and systolic function. No significant valvular abnormalities.  . History of palpitations    OCCASIONAL  . Kidney stones   . Lung nodule    a. 02/2016 PET scan: intensely hypermetabolic 2.0 x 1.5 cm central RLL nodule (SUV 23).  . Paroxysmal atrial flutter ([place])    a. On eliquis and sotalol (CHA2DS2VASc = 1).  . Rheumatoid arthritis ([place])    "qwhere"  . Stroke [facility]) 1994   "when I had ruptured aneurysm in my head"  . TIA (transient ischemic attack)    1994 WITH ANEYRYSM    Family History  Problem  Relation Age of Onset  . Heart disease Father        Aneurysm  . Diabetes Father   . Heart attack Father   . Heart disease Brother        Heart Disease before age 50  . Cancer Brother   . Hypertension Neg Hx   . Stroke Neg Hx     SOCIAL HISTORY: Social History  Substance Use Topics  . Smoking status: Former Smoker    Packs/day: 2.50    Years: 37.00    Types: Cigarettes    Quit date: 09/25/1992  . Smokeless tobacco: Never Used  . Alcohol use No    Allergies  Allergen Reactions  . No Known Allergies Other (See Comments)    Current Outpatient Prescriptions  Medication Sig Dispense Refill  . acetaminophen (TYLENOL) 325 MG tablet Take 2 tablets (650 mg total) by mouth every 4 (four) hours as needed for headache or mild pain.    Marland Kitchen ELIQUIS 5 MG TABS tablet TAKE 1 TABLET BY MOUTH TWICE A DAY 637 tablet 1  . folic acid (FOLVITE) 1 MG tablet Take 1 mg by mouth every morning.     Marland Kitchen ipratropium (ATROVENT) 0.06 % nasal spray Place 2 sprays into both nostrils 4 (four) times daily.  15 mL 12  . lansoprazole (PREVACID) 30 MG capsule Take 30 mg by mouth every morning.     . methotrexate (RHEUMATREX) 5 MG tablet Take 5 mg by mouth once a week. Caution: Chemotherapy. Protect from light.    . Multiple Vitamins-Minerals (CENTRUM SILVER ADULT 50+) TABS Take 50 mg by mouth daily.    Marland Kitchen oxyCODONE (OXY IR/ROXICODONE) 5 MG immediate release tablet Take 1-2 tablets (5-10 mg total) by mouth every 6 (six) hours as needed for severe pain. 30 tablet 0  . predniSONE (DELTASONE) 50 MG tablet 1 tab po daily for 6 days. Take with food. Start 02/16/17. 6 tablet 0   No current facility-administered medications for this visit.     REVIEW OF SYSTEMS:  [X]  denotes positive finding, [ ]  denotes negative finding Cardiac  Comments:  Chest pain or chest pressure:    Shortness of breath upon exertion:    Short of breath when lying flat:    Irregular heart rhythm:        Vascular    Pain in calf, thigh, or hip  brought on by ambulation:    Pain in feet at night that wakes you up from your sleep:     Blood clot in your veins:    Leg swelling:         Pulmonary    Oxygen at home:    Productive cough:     Wheezing:         Neurologic    Sudden weakness in arms or legs:     Sudden numbness in arms or legs:     Sudden onset of difficulty speaking or slurred speech:    Temporary loss of vision in one eye:     Problems with dizziness:         Gastrointestinal    Blood in stool:     Vomited blood:         Genitourinary    Burning when urinating:     Blood in urine:        Psychiatric    Major depression:         Hematologic    Bleeding problems:    Problems with blood clotting too easily:        Skin    Rashes or ulcers:        Constitutional    Fever or chills:     PHYSICAL EXAM:   Vitals:   02/21/17 0900  BP: 116/71  Pulse: 79  Resp: 18  Temp: 97.2 F (36.2 C)  SpO2: 95%  Weight: 231 lb (104.8 kg)  Height: 5\' 9"  (1.753 m)    GENERAL: The patient is a well-nourished male, in no acute distress. The vital signs are documented above. CARDIAC: There is a regular rate and rhythm.  VASCULAR: I do not detect carotid bruits. He has palpable femoral, popliteal, and pedal pulses bilaterally. PULMONARY: There is good air exchange bilaterally without wheezing or rales. ABDOMEN: Soft and non-tender with normal pitched bowel sounds. It is difficult to palpate his aneurysm because of his size. MUSCULOSKELETAL: There are no major deformities or cyanosis. NEUROLOGIC: No focal weakness or paresthesias are detected. SKIN: There are no ulcers or rashes noted. PSYCHIATRIC: The patient has a normal affect.  DATA:    DUPLEX OF ABDOMINAL AORTA: I have independently interpreted his duplex of his abdominal aorta. The maximum diameter of the aneurysm is 3.6 cm. The right common iliac artery measures 1.35 cm in maximum diameter. The left  common iliac artery measures 1.56 cm in maximum  diameter. Thus the aneurysm has not changed in size in 18 months.  MEDICAL ISSUES:   3.6 CM INFRARENAL ABDOMINAL AORTIC ANEURYSM: There has been no change in the size of his small aneurysm. He is asymptomatic. I think it is safe to continue his follow up with duplex at 18 months. I have ordered this study. He understands we would not consider elective repair and less than reached 5.5 cm in maximum diameter. If the aneurysm enlarges we may have to tighten up his follow up. Fortunately, he is not a smoker. I will see him back in 18 months. He knows to call sooner if he has problems.  Deitra Mayo [facility] [phone]

## 2017-03-02 NOTE — Addendum Note (Signed)
Addended by: Lianne Cure A on: 03/02/2017 01:55 PM   Modules accepted: Orders

## 2017-04-11 ENCOUNTER — Other Ambulatory Visit: Payer: Self-pay | Admitting: *Deleted

## 2017-04-11 DIAGNOSIS — R911 Solitary pulmonary nodule: Secondary | ICD-10-CM

## 2017-05-02 ENCOUNTER — Ambulatory Visit: Payer: Medicare Other | Admitting: Cardiothoracic Surgery

## 2017-05-02 ENCOUNTER — Ambulatory Visit
Admission: RE | Admit: 2017-05-02 | Discharge: 2017-05-02 | Disposition: A | Discharge status: Home / Self Care | Payer: Medicare Other | Source: Ambulatory Visit | Attending: Cardiothoracic Surgery | Admitting: Cardiothoracic Surgery

## 2017-05-02 DIAGNOSIS — R911 Solitary pulmonary nodule: Secondary | ICD-10-CM

## 2017-05-02 MED ORDER — IOPAMIDOL (ISOVUE-300) INJECTION 61%
75.0000 mL | Freq: Once | INTRAVENOUS | Status: AC | PRN
  Administered 2017-05-02: 75 mL via INTRAVENOUS

## 2017-05-03 NOTE — Progress Notes (Signed)
NAME:  Stephens, Jeffrey                    ACCOUNT NO.:  MEDICAL RECORD NO.:  [id number]  LOCATION:                                 FACILITY:  PHYSICIAN:  Ivin Poot, M.D.       DATE OF BIRTH:                                PROGRESS NOTE   HISTORY OF PRESENT ILLNESS: The patient is a 76 year old Caucasian male who returns for 1 year followup with CT scan after undergoing right VATS and right lower lobe pulmonary resection of a bronchogenic carcinoma.  The patient is a nonsmoker.  He denies any pulmonary symptoms.  His weight has been stable.  He has no headache or new bone pain.  CT scan images performed today on chest CT scan with contrast were personally reviewed and counseled with the patient.  There was no evidence of recurrent or new lung tumor.  No evidence of at risk mediastinal adenopathy.  At 1 year post pulmonary resection, there is no evidence of recurrent cancer.  PHYSICAL EXAMINATION: VITAL SIGNS:  The patient's weight is 232 pounds, blood pressure 122/77, pulse 90 and regular.  Saturation room air 93%. GENERAL APPEARANCE:  Elderly Caucasian obese male in no acute distress. LUNGS:  Breath sounds are clear and equal.  Right chest incision has well healed. CARDIAC:  Regular rhythm without murmur. NEUROLOGIC:  Intact.  LABORATORY DATA: CT scan has not been officially reported, but by personal review, there appears to be no evidence of recurrent cancer at 1 year following resection.  PLAN: The patient will return with CT scan of chest with contrast in 1 year, for surveillance scan 2 years postop pulmonary resection for cancer.     Ivin Poot, M.D.     PV/MEDQ  D:  05/02/2017  T:  05/02/2017  Job:  [id number]

## 2017-07-01 IMAGING — DX DG CHEST 2V
2 series · 2 of 2 positions shown · non-contrast
Comparison: One day prior

CLINICAL DATA: Shortness of breath.

EXAM:
CHEST  2 VIEW

[chest pa]
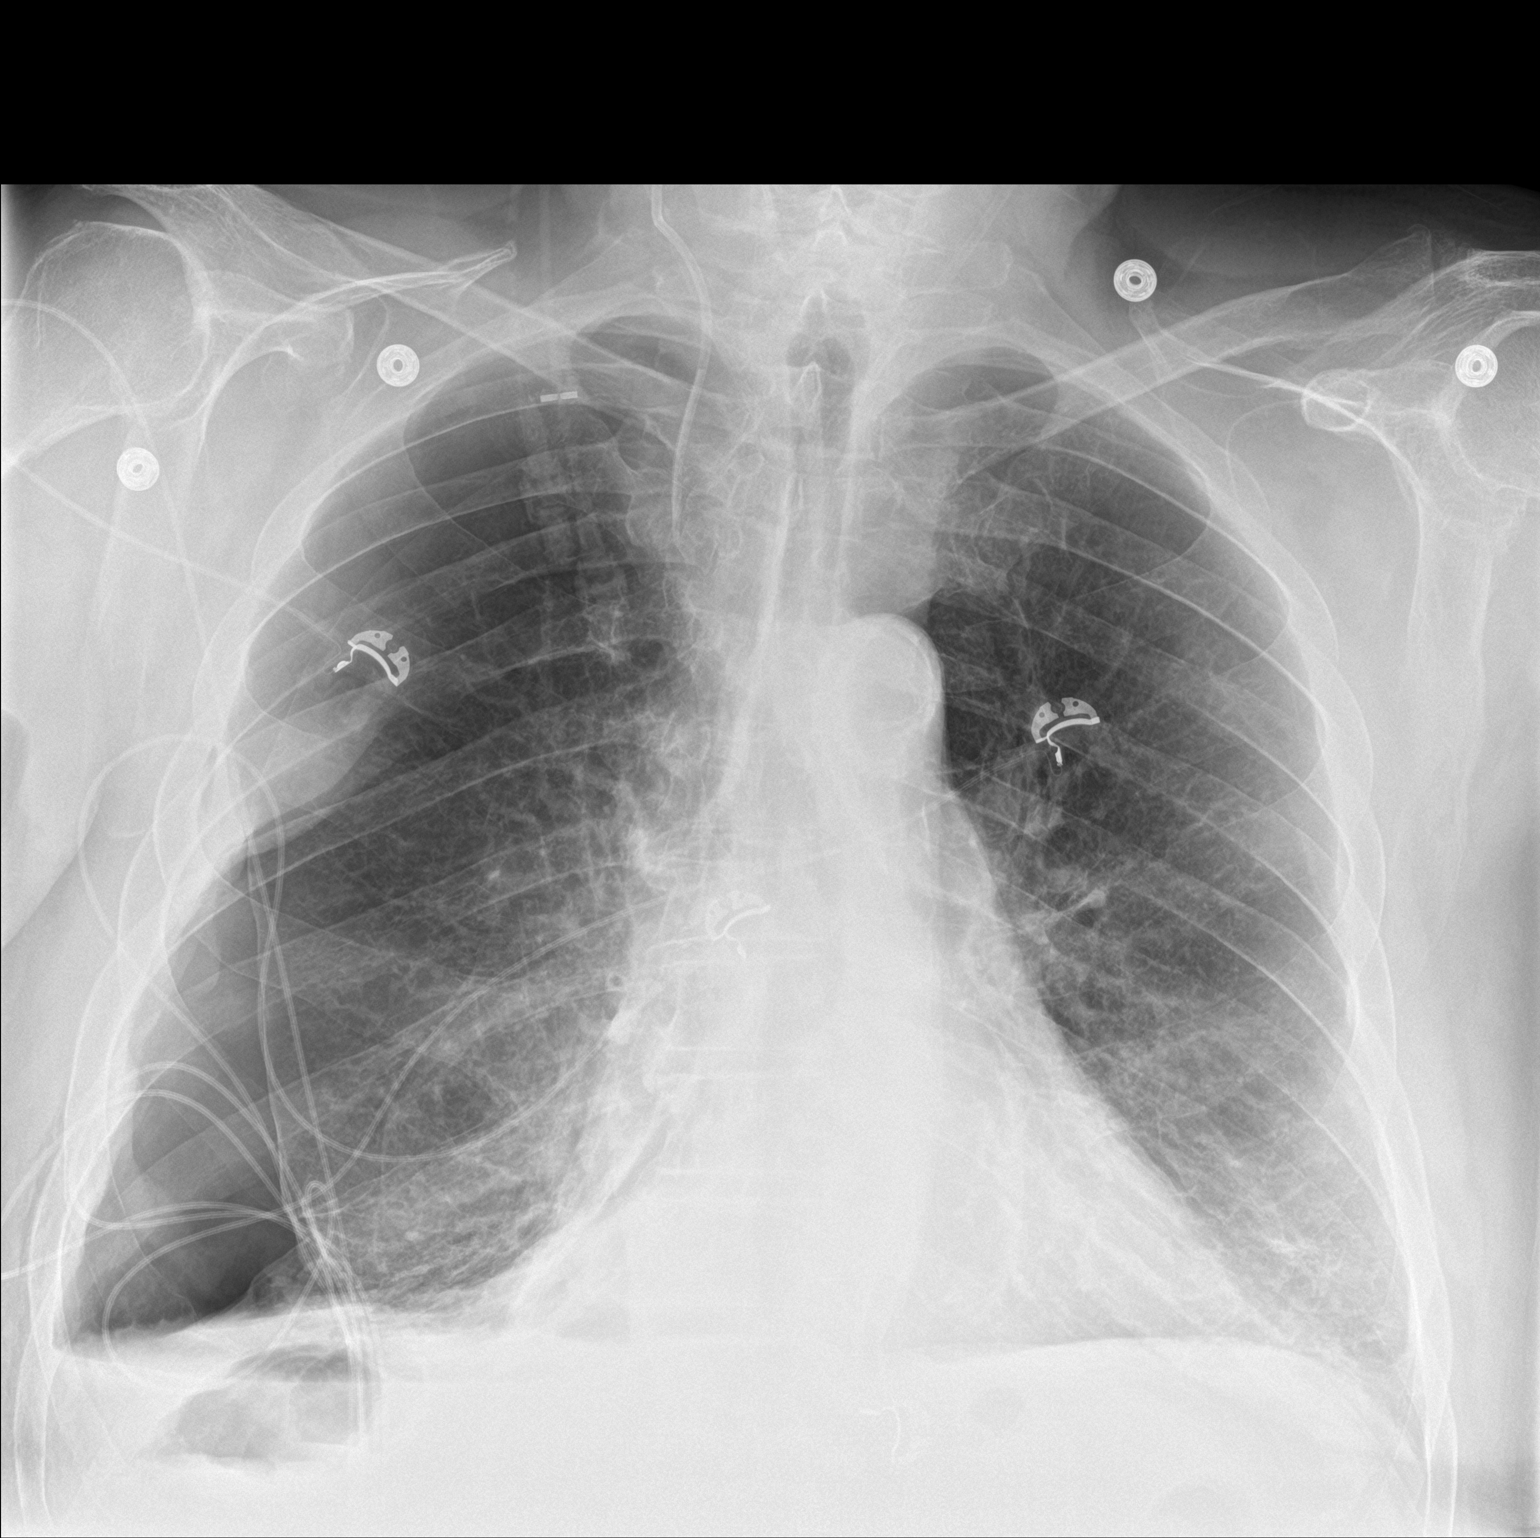

[chest lat]
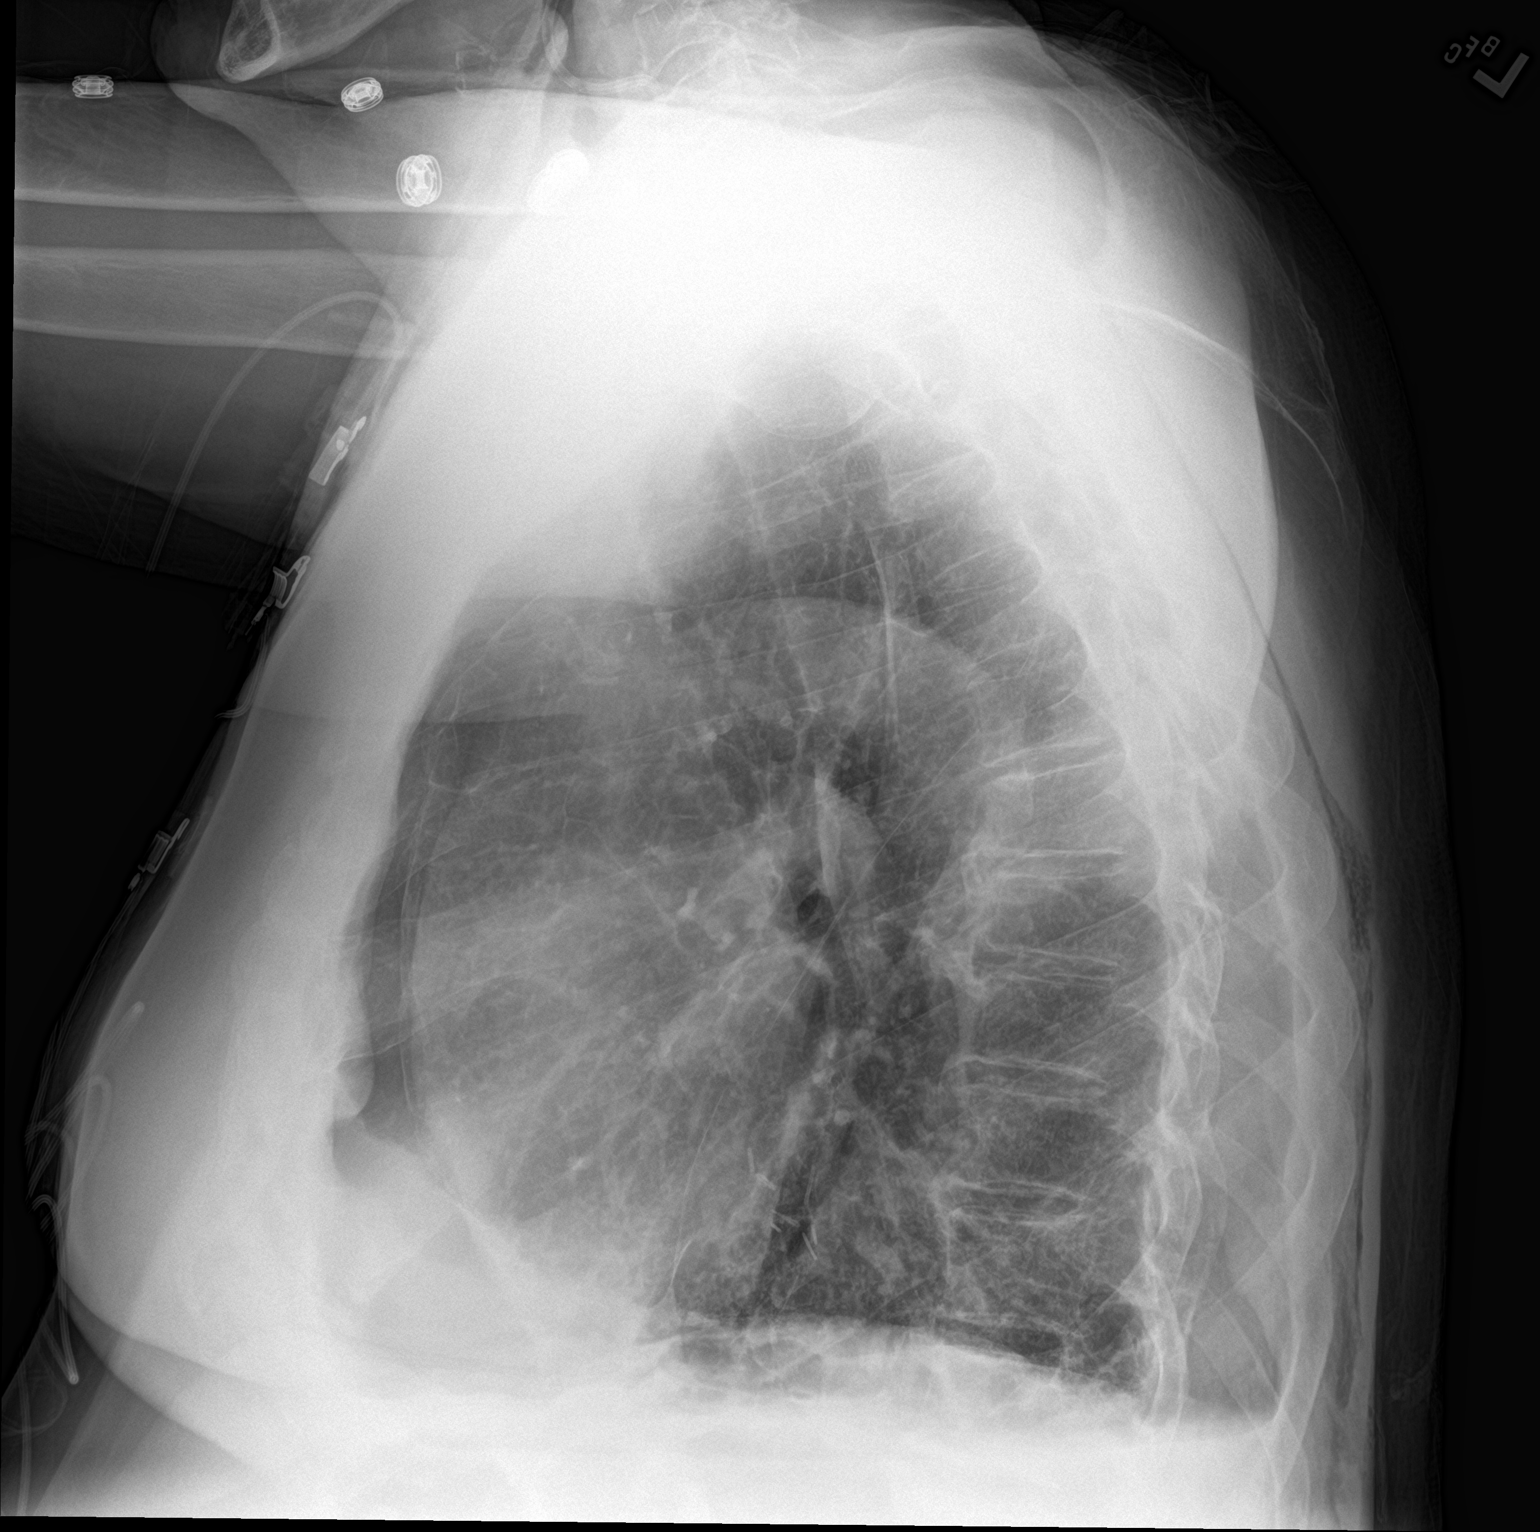

[2 of 2 positions shown; findings below may reference images not displayed]

FINDINGS: Right internal jugular line unchanged with tip at high SVC.
Hyperinflation. Midline trachea. Mild cardiomegaly with transverse
aortic atherosclerosis. Right-sided hydropneumothorax again
identified. This is enlarged. The visceral pleural line projects
cm from the chest wall near the apex. Compare 2.2 cm previously.
There is also an increased inferior lateral component.

Mild bibasilar atelectasis. Lower lobe predominant interstitial
thickening.
IMPRESSION: 1. Increase in the pleural air component of the right-sided
hydropneumothorax. Estimated at 25-30%. No mediastinal shift.
2. Cardiomegaly with chronic interstitial thickening and aortic
atherosclerosis.
These results will be called to the ordering clinician or
representative by the Radiologist Assistant, and communication
documented in the PACS or zVision Dashboard.

## 2017-07-01 IMAGING — CR DG CHEST 1V PORT
1 series · 1 of 1 positions shown · non-contrast
Comparison: Study obtained earlier in the day

CLINICAL DATA: Chest tube placement for pneumothorax

EXAM:
PORTABLE CHEST 1 VIEW

[AP]
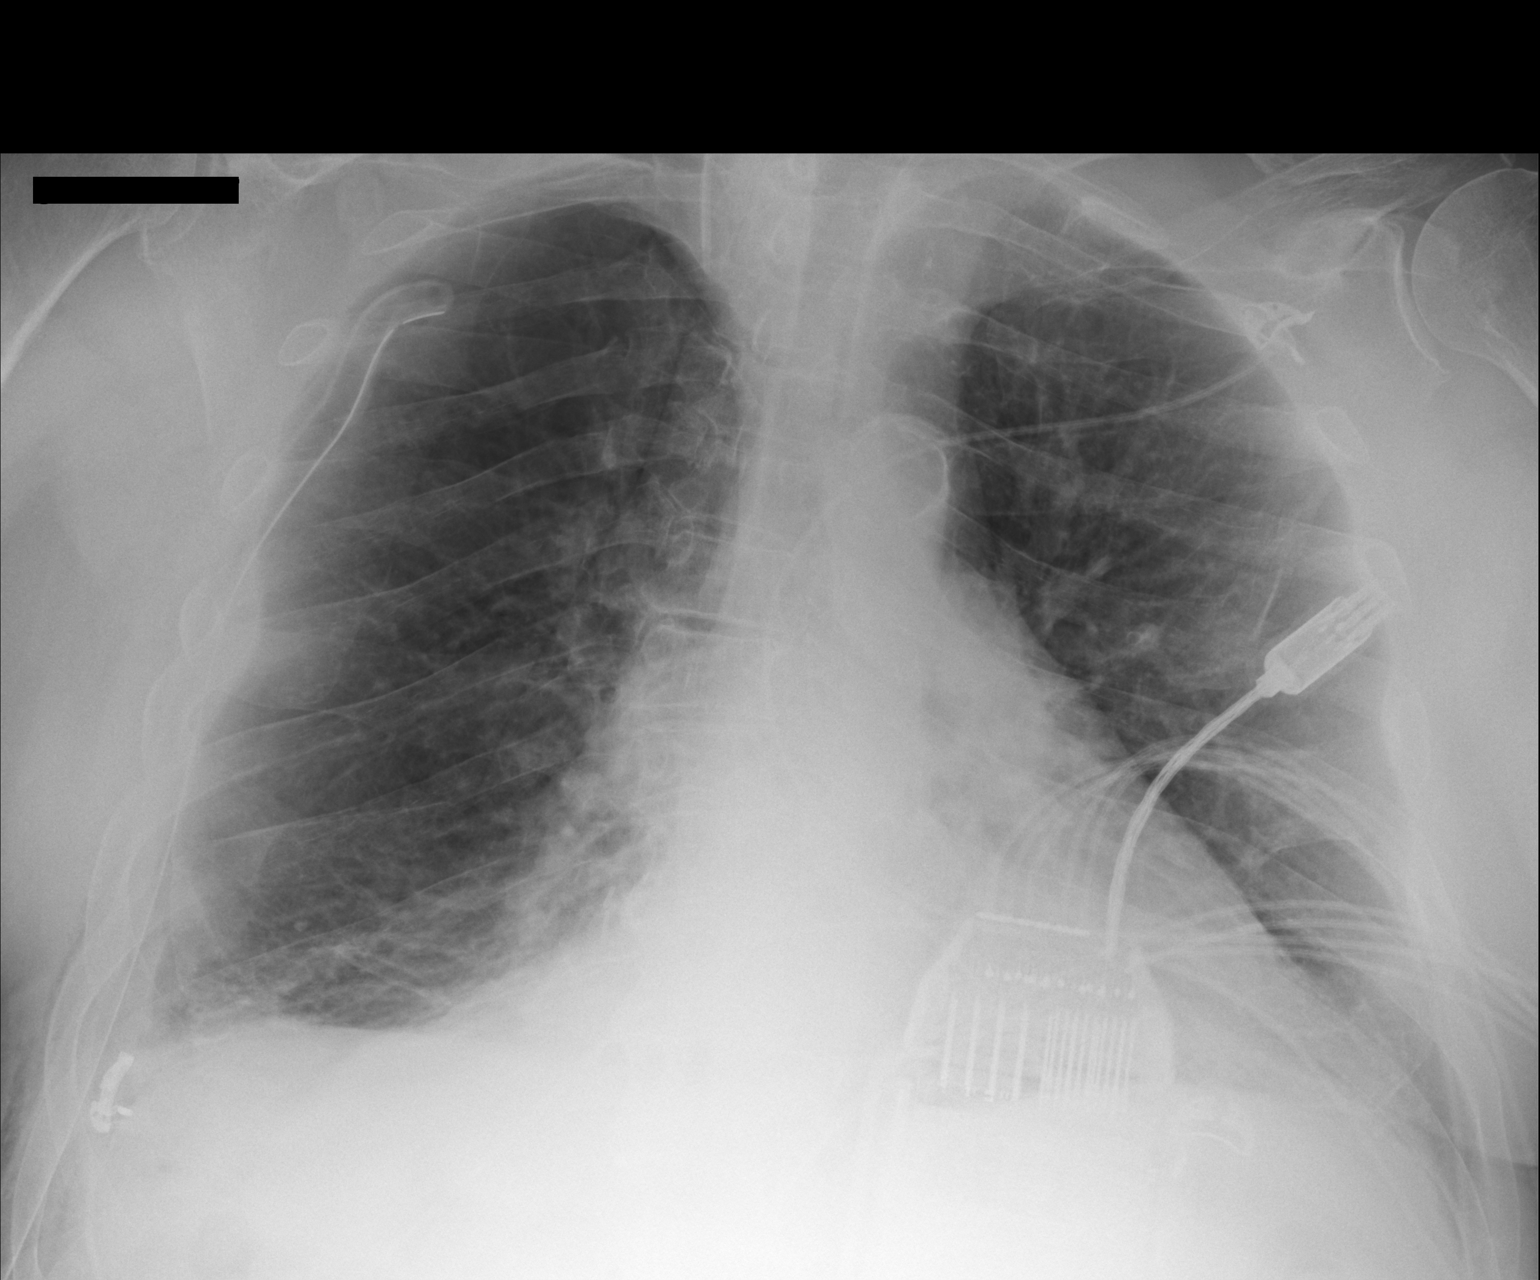

[1 of 1 positions shown; findings below may reference images not displayed]

FINDINGS: Chest tube is present on the right. Right pneumothorax is
considerably smaller. Medial and apical components of the
pneumothorax on the right do remain. No tension component. Central
catheter tip is in the superior vena cava. There is mild atelectasis
in the right base. The lungs elsewhere clear. Heart is mildly
enlarged with pulmonary vascularity within normal limits. No
adenopathy. There is atherosclerotic calcification in the aorta.
IMPRESSION: Pneumothorax on the right much smaller following chest tube
placement. Central catheter tip in superior vena cava. Mild right
base atelectasis. Lungs elsewhere clear. Stable cardiac silhouette.
There is aortic atherosclerosis.

## 2017-07-02 IMAGING — CR DG CHEST 2V
2 series · 2 of 2 positions shown · non-contrast
Comparison: 05/14/2016 .

CLINICAL DATA: Chest tube.  Shortness of breath .

EXAM:
CHEST  2 VIEW

[chest pa]
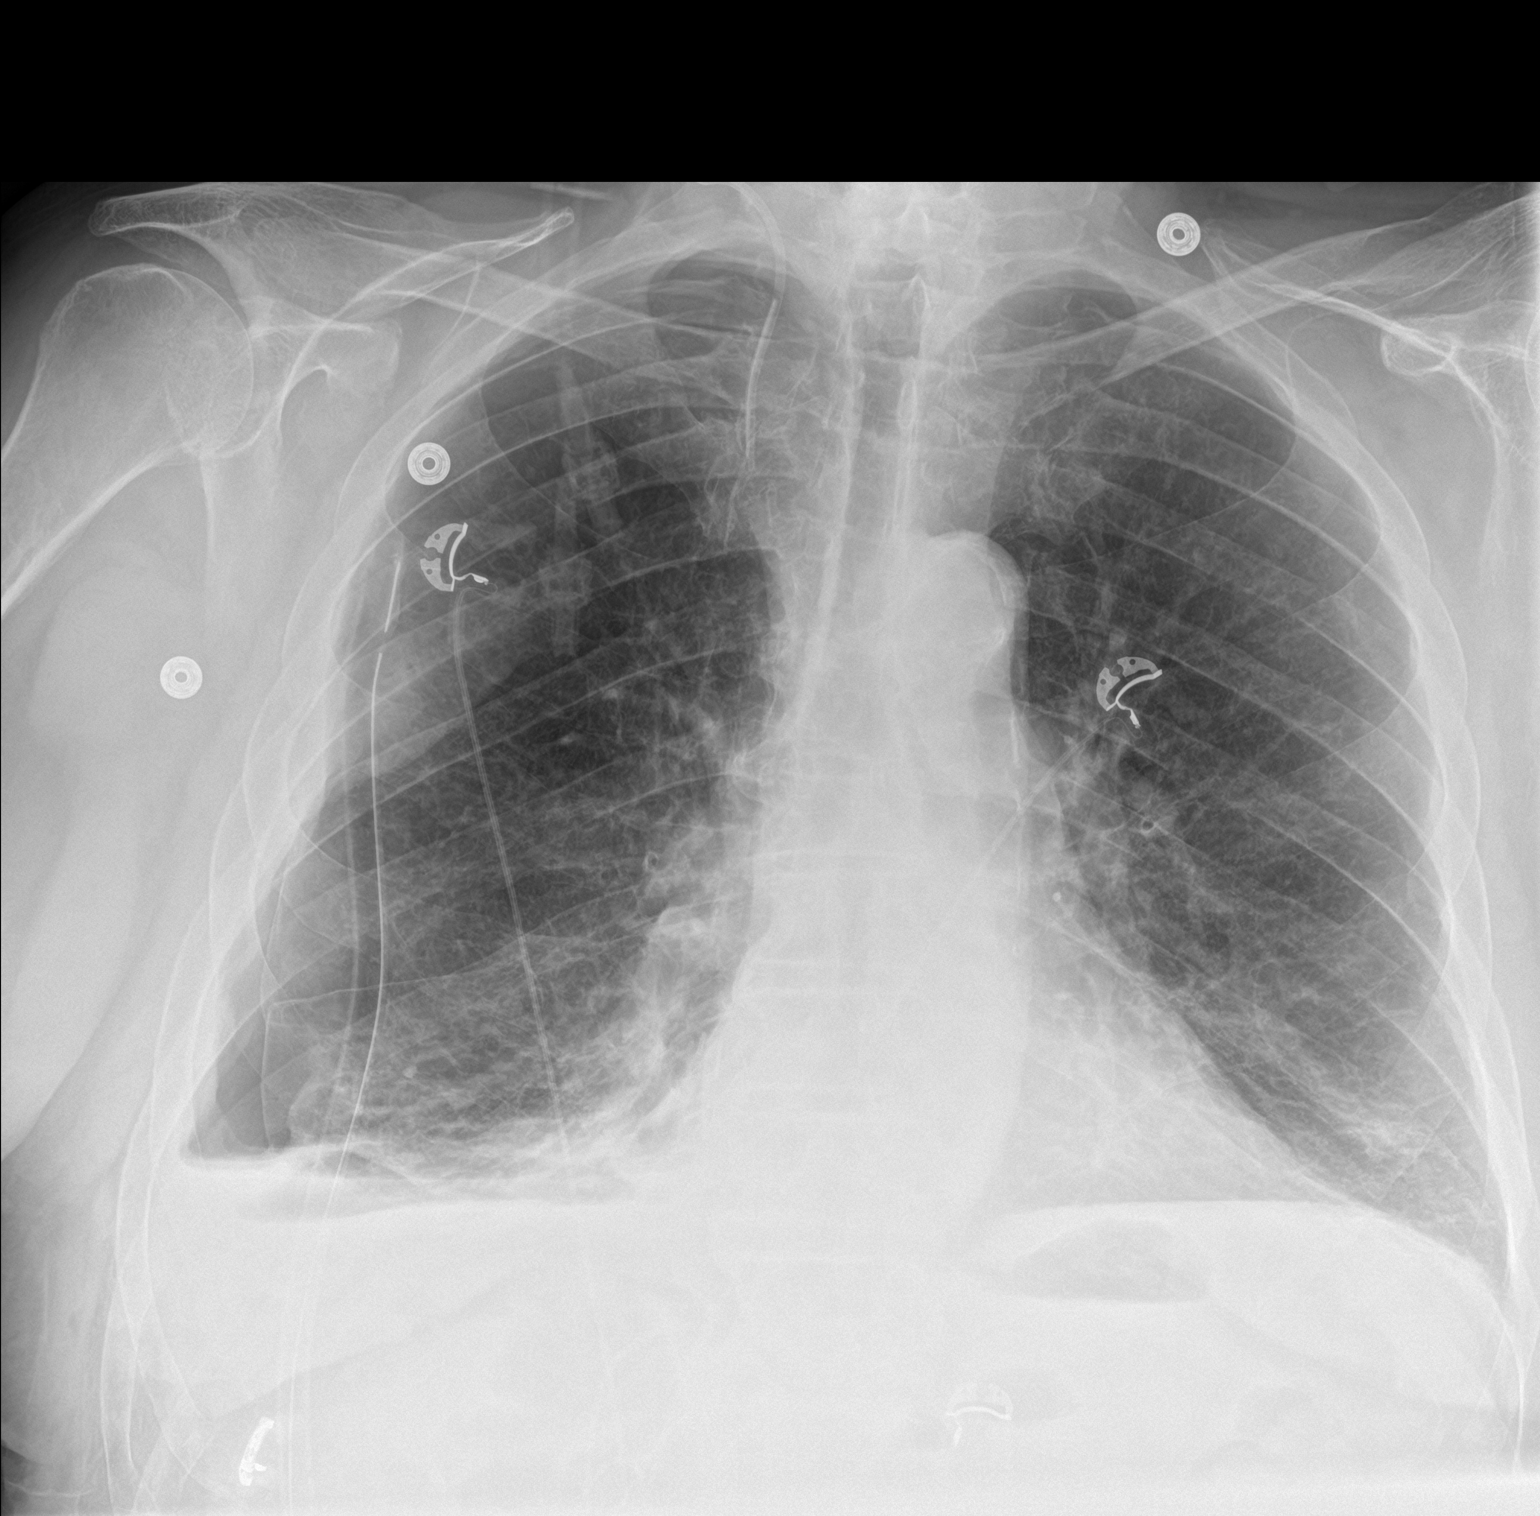

[chest lat]
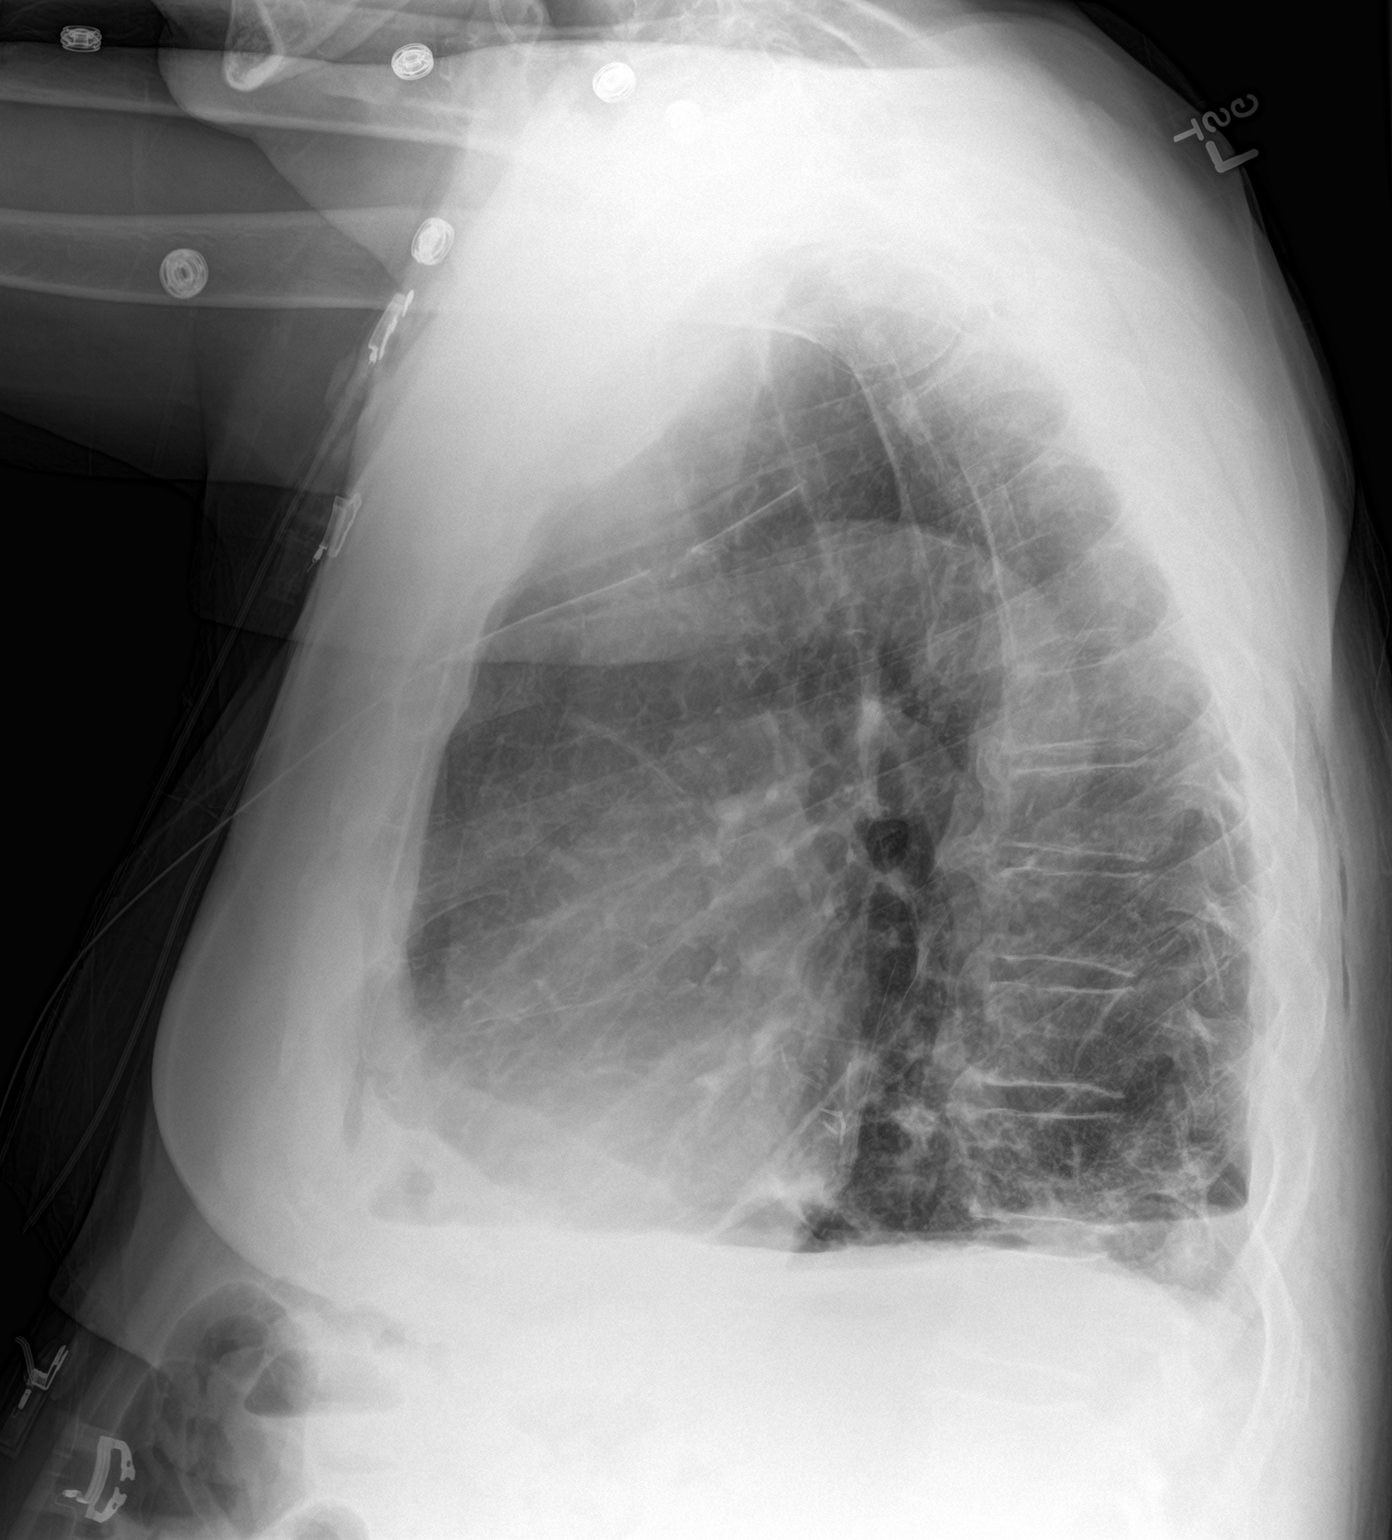

[2 of 2 positions shown; findings below may reference images not displayed]

FINDINGS: Right IJ line stable position Right chest tube in stable position.
Small right pneumothorax again noted. Pneumothorax is slightly
larger on today's exam.Mediastinum and hilar structures are normal.
Persistent mild bibasilar atelectasis. Heart size normal. Small
right pleural effusion.
IMPRESSION: 1. Right IJ line in stable position . Right chest tube in stable
position. Small right pneumothorax again noted. Pneumothorax
slightly more prominent on today's exam. Small right pleural
effusion noted on today's exam. Follow-up chest x-ray suggested
continued evaluation.

2. Persistent bibasilar atelectasis.

## 2017-07-18 ENCOUNTER — Telehealth: Payer: Self-pay | Admitting: Cardiovascular Disease

## 2017-07-18 NOTE — Telephone Encounter (Signed)
    Chart reviewed as part of pre-operative protocol coverage. Patient was contacted 07/18/2017 in reference to pre-operative risk assessment for pending surgery as outlined below.  LYNDAL REGGIO was last seen on 01/18/17 by Dr. Lorenza Cambridge.  Since that day, FINNEAS MATHE has done well w/o cardiac complaints. He denies CP and dyspnea. He is able to complete at least 4 METS of physical activity w/o CP or dyspnea (walk up a flight of stairs). He denies syncope/ near syncope.   Therefore, based on ACC/AHA guidelines, the patient would be at acceptable risk for the planned procedure without further cardiovascular testing.   Pharmacy to address Eliquis.    Lyda Jester, PA-C 07/18/2017, 4:24 PM

## 2017-07-18 NOTE — Telephone Encounter (Signed)
   [facility] Pre-operative Risk Assessment    Request for surgical clearance:  1. What type of surgery is being performed? Colonoscopy for screening for colon cancer   2. When is this surgery scheduled? 08/08/2017   3. Are there any medications that need to be held prior to surgery and how long? eliquis    4. Practice name and name of physician performing surgery? Dr. Penelope Coop with [facility]   5. What is your office phone and fax number? (p) [phone]  (f) [phone]   6. Anesthesia type (None, local, MAC, general) ? Not specified    Fidel Levy 07/18/2017, 2:44 PM  _________________________________________________________________   (provider comments below)

## 2017-07-18 NOTE — Telephone Encounter (Signed)
    Chart reviewed as part of pre-operative protocol coverage. Patient was contacted 07/18/2017 in reference to pre-operative risk assessment for pending surgery as outlined below. Pt not reached by phone. Message left for pt to call back regarding preoperative clearance to check for new cardiac symptoms. Clearance pending. Await callback.    Lyda Jester, PA-C 07/18/2017, 3:17 PM

## 2017-07-19 NOTE — Telephone Encounter (Signed)
Pt takes Eliquis for afib with CHADS2VASc score of 4 (age, CAD, stroke in 13). Also has hx of DVT in his left leg in 2001. Renal function is normal. Recommend holding Eliquis for only 24 hours prior to procedure due to above risk factors.

## 2017-07-20 NOTE — Telephone Encounter (Signed)
Faxed to Dr Estell Harpin office via [place].

## 2017-07-20 NOTE — Telephone Encounter (Signed)
Patient was cleared by Lyda Jester on 07/18/2017, per pharmacist's recommendation, he will need to hold eliquis for 24 hours prior to the surgery and restart as soon as possible after the procedure at the discretion of the surgeon.

## 2017-07-23 NOTE — Telephone Encounter (Signed)
Per Pre-Op protocol pt cleared for surgery. Faxed through [employer], will fax hard copy to surgeon as well.

## 2017-08-02 ENCOUNTER — Other Ambulatory Visit: Payer: Self-pay | Admitting: Cardiovascular Disease

## 2017-08-28 ENCOUNTER — Encounter: Payer: Self-pay | Admitting: Cardiovascular Disease

## 2017-08-28 ENCOUNTER — Ambulatory Visit: Payer: Medicare Other | Admitting: Cardiovascular Disease

## 2017-08-28 VITALS — BP 132/82 | HR 73 | Ht 69.0 in | Wt 239.6 lb

## 2017-08-28 DIAGNOSIS — Z7901 Long term (current) use of anticoagulants: Secondary | ICD-10-CM

## 2017-08-28 DIAGNOSIS — I714 Abdominal aortic aneurysm, without rupture, unspecified: Secondary | ICD-10-CM

## 2017-08-28 DIAGNOSIS — I712 Thoracic aortic aneurysm, without rupture: Secondary | ICD-10-CM | POA: Diagnosis not present

## 2017-08-28 DIAGNOSIS — J439 Emphysema, unspecified: Secondary | ICD-10-CM | POA: Diagnosis not present

## 2017-08-28 DIAGNOSIS — I2510 Atherosclerotic heart disease of native coronary artery without angina pectoris: Secondary | ICD-10-CM

## 2017-08-28 DIAGNOSIS — Z79899 Other long term (current) drug therapy: Secondary | ICD-10-CM

## 2017-08-28 DIAGNOSIS — I7121 Aneurysm of the ascending aorta, without rupture: Secondary | ICD-10-CM

## 2017-08-28 DIAGNOSIS — I4892 Unspecified atrial flutter: Secondary | ICD-10-CM

## 2017-08-28 NOTE — Progress Notes (Signed)
Patient ID: Jeffrey Stephens, male   DOB: 31-Jul-1941, 76 y.o.   MRN: [id number]    Cardiology Office Note    Date:  08/28/2017   ID:  Jeffrey, Stephens 1941/02/22, MRN [id number]  PCP:  Lavone Orn, MD  Cardiologist:   Sanda Klein, MD   Chief Complaint  Patient presents with  . Follow-up    History of Present Illness:  Jeffrey Stephens is a 76 y.o. male with a history of recurrent persistent atrial flutter that responded well to sotalol therapy, but medication stopped due to bradycardia and near-syncope  We discussed the possible need for pacemaker, but he declined. He is appropriately anticoagulated with Eliquis and has not had any bleeding problems.   Roughly a year ago he underwent resection of a right lower lobe primary lung adenocarcinoma with curative intent. The chest CT showed a 4 cm ascending aortic aneurysm, but a follow up CT August 2018 describes the aorta as non-aneurysmal, 3.8 cm. He has a small fusiform AAA, <4 cm at last [place].  He initially presented with paroxysmal atrial flutter in August 2015 in the setting of recent left knee surgery. There was evidence of normal left ventricular systolic function by both echo and nuclear scintigraphy, he did not have significant valvular disease in his left atrium was only mildly dilated. He has calcified coronaries on CT of the chest but has never had angina. A nuclear study was interpreted as showing possible inferior wall reversible defect (question combination of diaphragmatic attenuation artifact and excessive intestinal activity). He was hospitalized in November 2015 with atrial flutter with rapid ventricular response and converted on treatment with sotalol. As far as we know he has never had any embolic events. He has a remote history of deep venous thrombosis of the lower extremities, brain aneurysm complicated by secondary hemorrhagic stroke but treated with successful clipping, AAA.   Past Medical History:  Diagnosis Date    . AAA (abdominal aortic aneurysm) ([place])    a. followed by Dr. Deitra Mayo - 01/2016 CT: 3.6 cm infrarenal AAA.  Marland Kitchen Abnormal nuclear cardiac imaging test 07/13/2014   a. 06/2014 MV: possible reversible inferior wall defect,no wma, nl EF, felt to be artifact.  . Basal cell carcinoma    "LLE; some on my head"  . BPH (benign prostatic hyperplasia)   . Cerebral aneurysm 1994  . Constipation   . Depression   . DVT (deep venous thrombosis) ([place]) 2001   Hx of Left leg   . Dysrhythmia    AFIB   . Frequency of urination   . GERD (gastroesophageal reflux disease)   . History of echocardiogram    a. 2D ECHO: 06/11/2014: EF 55-60%. Normal wall thickness. Indeterminate DD ( atrial flutter ). No RWMA. Mild LA dilation. Normal RV size and systolic function. No significant valvular abnormalities.  . History of palpitations    OCCASIONAL  . Kidney stones   . Lung nodule    a. 02/2016 PET scan: intensely hypermetabolic 2.0 x 1.5 cm central RLL nodule (SUV 23).  . Paroxysmal atrial flutter ([place])    a. On eliquis and sotalol (CHA2DS2VASc = 1).  . Rheumatoid arthritis ([place])    "qwhere"  . Stroke [facility]) 1994   "when I had ruptured aneurysm in my head"  . TIA (transient ischemic attack)    1994 WITH ANEYRYSM    Past Surgical History:  Procedure Laterality Date  . BASAL CELL CARCINOMA EXCISION     "LLE; top of my  head"  . CEREBRAL ANEURYSM REPAIR  1994   Hx of ruptured brain aneurysm Tx by Dr. Ellene Route  . CYSTOSCOPY W/ STONE MANIPULATION  1980's X 1  . EXCISIONAL HEMORRHOIDECTOMY  1970's  . JOINT REPLACEMENT     LT KNEE  . TOTAL KNEE ARTHROPLASTY Left 05/14/2014   Procedure: LEFT TOTAL KNEE ARTHROPLASTY;  Surgeon: Johnn Hai, MD;  Location: [facility];  Service: Orthopedics;  Laterality: Left;  Marland Kitchen VIDEO ASSISTED THORACOSCOPY (VATS)/WEDGE RESECTION Right 05/08/2016   Procedure: VIDEO ASSISTED THORACOSCOPY (VATS)/LUNG RESECTION;  Surgeon: Ivin Poot, MD;  Location: [facility];  Service:  Thoracic;  Laterality: Right;    Current Medications: Outpatient Medications Prior to Visit  Medication Sig Dispense Refill  . acetaminophen (TYLENOL) 325 MG tablet Take 2 tablets (650 mg total) by mouth every 4 (four) hours as needed for headache or mild pain.    Marland Kitchen ELIQUIS 5 MG TABS tablet TAKE 1 TABLET BY MOUTH TWICE A DAY 381 tablet 0  . folic acid (FOLVITE) 1 MG tablet Take 1 mg by mouth every morning.     . lansoprazole (PREVACID) 30 MG capsule Take 30 mg by mouth every morning.     . methotrexate (RHEUMATREX) 5 MG tablet Take 5 mg by mouth once a week. Caution: Chemotherapy. Protect from light.    . Multiple Vitamins-Minerals (CENTRUM SILVER ADULT 50+) TABS Take 50 mg by mouth daily.    Marland Kitchen oxyCODONE (OXY IR/ROXICODONE) 5 MG immediate release tablet Take 1-2 tablets (5-10 mg total) by mouth every 6 (six) hours as needed for severe pain. 30 tablet 0  . ipratropium (ATROVENT) 0.06 % nasal spray Place 2 sprays into both nostrils 4 (four) times daily. 15 mL 12  . predniSONE (DELTASONE) 50 MG tablet 1 tab po daily for 6 days. Take with food. Start 02/16/17. 6 tablet 0   No facility-administered medications prior to visit.      Allergies:   No known allergies   Social History   Socioeconomic History  . Marital status: Widowed    Spouse name: None  . Number of children: None  . Years of education: None  . Highest education level: None  Social Needs  . Financial resource strain: None  . Food insecurity - worry: None  . Food insecurity - inability: None  . Transportation needs - medical: None  . Transportation needs - non-medical: None  Occupational History  . None  Tobacco Use  . Smoking status: Former Smoker    Packs/day: 2.50    Years: 37.00    Pack years: 92.50    Types: Cigarettes    Last attempt to quit: 09/25/1992    Years since quitting: 24.9  . Smokeless tobacco: Never Used  Substance and Sexual Activity  . Alcohol use: No    Alcohol/week: 0.0 oz  . Drug use: No  .  Sexual activity: Not Currently  Other Topics Concern  . None  Social History Narrative  . None     Family History:  The patient's family history includes Cancer in his brother; Diabetes in his father; Heart attack in his father; Heart disease in his brother and father.   ROS:   Please see the history of present illness.    ROS All other systems reviewed and are negative.   PHYSICAL EXAM:   VS:  BP 132/82   Pulse 73   Ht 5\' 9"  (1.753 m)   Wt 239 lb 9.6 oz (108.7 kg)   BMI 35.38 kg/m  General: Alert, oriented x3, no distress, obesity Head: no evidence of trauma, PERRL, EOMI, no exophtalmos or lid lag, no myxedema, no xanthelasma; normal ears, nose and oropharynx Neck: normal jugular venous pulsations and no hepatojugular reflux; brisk carotid pulses without delay and no carotid bruits Chest: clear to auscultation, no signs of consolidation by percussion or palpation, normal fremitus, symmetrical and full respiratory excursions Cardiovascular: normal position and quality of the apical impulse, regular rhythm, normal first and second heart sounds, no murmurs, rubs or gallops Abdomen: no tenderness or distention, no masses by palpation, no abnormal pulsatility or arterial bruits, normal bowel sounds, no hepatosplenomegaly Extremities: no clubbing, cyanosis or edema; 2+ radial, ulnar and brachial pulses bilaterally; 2+ right femoral, posterior tibial and dorsalis pedis pulses; 2+ left femoral, posterior tibial and dorsalis pedis pulses; no subclavian or femoral bruits Neurological: grossly nonfocal Psych: Normal mood and affect   Wt Readings from Last 3 Encounters:  08/28/17 239 lb 9.6 oz (108.7 kg)  02/21/17 231 lb (104.8 kg)  01/18/17 236 lb (107 kg)      Studies/Labs Reviewed:   EKG:  EKG is ordered today.  The ekg ordered today demonstrates NSR, normal tracing  Lipid Panel    Component Value Date/Time   CHOL 95 06/12/2014 0050   TRIG 80 06/12/2014 0050   HDL 32 (L)  06/12/2014 0050   CHOLHDL 3.0 06/12/2014 0050   VLDL 16 06/12/2014 0050   LDLCALC 47 06/12/2014 0050     ASSESSMENT:    1. Abdominal aortic aneurysm (AAA) without rupture ([place])   2. Atrial flutter, paroxysmal ([place])   3. Long term current use of anticoagulant   4. Coronary artery calcification seen on CT scan   5. Ascending aortic aneurysm ([facility])   6. Pulmonary emphysema, unspecified emphysema type ([place])   7. Medication management      PLAN:  In order of problems listed above:  1. AFlutter: surprisingly no clinical recurrence since stopping the sotalol (could be having episodes with spontaneously controlled ventricular rate that are asymptomatic). No near syncope recurrence since stopping sotalol 2. Eliquis: CHADS Vasc 3 (age 68, vascular disease). No bleeding complications. 3. Coronary Ca: Low risk nuclear study. Asymptomatic. 4. AAA: Ultrasound in November 2016 - small, only measures 3.6 cm in maximum diameter, mildly increased compared to 2015 (3.0 in 2012). Recheck in a year or so. 5. ATAA: 4 cm ascending aneurysm reported on CT 05/2016 during f/u for lung neoplasm, but not confirmed and measured at 3.8 cm on follow up CT in 04/2017. 6. COPD: mildly symptomatic   Medication Adjustments/Labs and Tests Ordered: Current medicines are reviewed at length with the patient today.  Concerns regarding medicines are outlined above.  Medication changes, Labs and Tests ordered today are listed in the Patient Instructions below. Patient Instructions  Dr [name] recommends that you schedule a follow-up appointment in 12 months. You will receive a reminder letter in the mail two months in advance. If you don't receive a letter, please call our office to schedule the follow-up appointment.  If you need a refill on your cardiac medications before your next appointment, please call your pharmacy.    Signed, Sanda Klein, MD  08/28/2017 9:07 AM    [facility] [address], [place], [place]  [address] Phone: [phone]; Fax: [phone]

## 2017-08-28 NOTE — Patient Instructions (Signed)
Dr Croitoru recommends that you schedule a follow-up appointment in 12 months. You will receive a reminder letter in the mail two months in advance. If you don't receive a letter, please call our office to schedule the follow-up appointment.  If you need a refill on your cardiac medications before your next appointment, please call your pharmacy. 

## 2017-11-04 ENCOUNTER — Other Ambulatory Visit: Payer: Self-pay | Admitting: Cardiovascular Disease

## 2018-02-08 ENCOUNTER — Other Ambulatory Visit: Payer: Self-pay | Admitting: Cardiovascular Disease

## 2018-04-03 ENCOUNTER — Other Ambulatory Visit: Payer: Self-pay | Admitting: *Deleted

## 2018-04-03 DIAGNOSIS — C3490 Malignant neoplasm of unspecified part of unspecified bronchus or lung: Secondary | ICD-10-CM

## 2018-04-11 ENCOUNTER — Ambulatory Visit
Admission: RE | Admit: 2018-04-11 | Discharge: 2018-04-11 | Disposition: A | Discharge status: Home / Self Care | Payer: Medicare Other | Source: Ambulatory Visit | Attending: Cardiothoracic Surgery | Admitting: Cardiothoracic Surgery

## 2018-04-11 DIAGNOSIS — C3490 Malignant neoplasm of unspecified part of unspecified bronchus or lung: Secondary | ICD-10-CM

## 2018-04-11 MED ORDER — IOPAMIDOL (ISOVUE-300) INJECTION 61%
75.0000 mL | Freq: Once | INTRAVENOUS | Status: AC | PRN
  Administered 2018-04-11: 75 mL via INTRAVENOUS

## 2018-04-17 ENCOUNTER — Encounter: Payer: Self-pay | Admitting: Cardiothoracic Surgery

## 2018-04-17 ENCOUNTER — Ambulatory Visit (INDEPENDENT_AMBULATORY_CARE_PROVIDER_SITE_OTHER): Payer: Medicare Other | Admitting: Cardiothoracic Surgery

## 2018-04-17 VITALS — BP 115/69 | HR 88 | Resp 20 | Ht 69.0 in | Wt 240.0 lb

## 2018-04-17 DIAGNOSIS — C3431 Malignant neoplasm of lower lobe, right bronchus or lung: Secondary | ICD-10-CM | POA: Diagnosis not present

## 2018-04-17 DIAGNOSIS — R911 Solitary pulmonary nodule: Secondary | ICD-10-CM

## 2018-04-17 NOTE — Progress Notes (Signed)
PCP is Lavone Orn, MD Referring Provider is Lavone Orn, MD  Chief Complaint  Patient presents with  . Lung Cancer    1 year f/u with Chest CT 04/11/2018  . Lung Lesion    Surveillance    HPI: Patient returns for scheduled annual surveillance scan of the chest 2 years after resection of a small stage I squamous cell carcinoma of the right lower lobe.  Patient has poor PFTs, diffusion capacity and had a wedge resection.  He still has some mild postthoracotomy discomfort under his right costal margin.  Complains of some shortness of breath but is fairly morbidly obese.  He is not smoking.  Today's CT scan shows no evidence of recurrent cancer or new lesions.  He has some stable small bilateral pulmonary nodules unchanged for 5 years.   Past Medical History:  Diagnosis Date  . AAA (abdominal aortic aneurysm) ([place])    a. followed by Dr. Deitra Mayo - 01/2016 CT: 3.6 cm infrarenal AAA.  Marland Kitchen Abnormal nuclear cardiac imaging test 07/13/2014   a. 06/2014 MV: possible reversible inferior wall defect,no wma, nl EF, felt to be artifact.  . Basal cell carcinoma    "LLE; some on my head"  . BPH (benign prostatic hyperplasia)   . Cerebral aneurysm 1994  . Constipation   . Depression   . DVT (deep venous thrombosis) ([place]) 2001   Hx of Left leg   . Dysrhythmia    AFIB   . Frequency of urination   . GERD (gastroesophageal reflux disease)   . History of echocardiogram    a. 2D ECHO: 06/11/2014: EF 55-60%. Normal wall thickness. Indeterminate DD ( atrial flutter ). No RWMA. Mild LA dilation. Normal RV size and systolic function. No significant valvular abnormalities.  . History of palpitations    OCCASIONAL  . Kidney stones   . Lung nodule    a. 02/2016 PET scan: intensely hypermetabolic 2.0 x 1.5 cm central RLL nodule (SUV 23).  . Paroxysmal atrial flutter ([place])    a. On eliquis and sotalol (CHA2DS2VASc = 1).  . Rheumatoid arthritis ([place])    "qwhere"  . Stroke [facility]) 1994   "when I  had ruptured aneurysm in my head"  . TIA (transient ischemic attack)    1994 WITH ANEYRYSM    Past Surgical History:  Procedure Laterality Date  . BASAL CELL CARCINOMA EXCISION     "LLE; top of my head"  . CEREBRAL ANEURYSM REPAIR  1994   Hx of ruptured brain aneurysm Tx by Dr. Ellene Route  . CYSTOSCOPY W/ STONE MANIPULATION  1980's X 1  . EXCISIONAL HEMORRHOIDECTOMY  1970's  . JOINT REPLACEMENT     LT KNEE  . TOTAL KNEE ARTHROPLASTY Left 05/14/2014   Procedure: LEFT TOTAL KNEE ARTHROPLASTY;  Surgeon: Johnn Hai, MD;  Location: [facility];  Service: Orthopedics;  Laterality: Left;  Marland Kitchen VIDEO ASSISTED THORACOSCOPY (VATS)/WEDGE RESECTION Right 05/08/2016   Procedure: VIDEO ASSISTED THORACOSCOPY (VATS)/LUNG RESECTION;  Surgeon: Ivin Poot, MD;  Location: [facility] OR;  Service: Thoracic;  Laterality: Right;    Family History  Problem Relation Age of Onset  . Heart disease Father        Aneurysm  . Diabetes Father   . Heart attack Father   . Heart disease Brother        Heart Disease before age 14  . Cancer Brother   . Hypertension Neg Hx   . Stroke Neg Hx     Social History Social History  Tobacco Use  . Smoking status: Former Smoker    Packs/day: 2.50    Years: 37.00    Pack years: 92.50    Types: Cigarettes    Last attempt to quit: 09/25/1992    Years since quitting: 25.5  . Smokeless tobacco: Never Used  Substance Use Topics  . Alcohol use: No    Alcohol/week: 0.0 oz  . Drug use: No    Current Outpatient Medications  Medication Sig Dispense Refill  . acetaminophen (TYLENOL) 325 MG tablet Take 2 tablets (650 mg total) by mouth every 4 (four) hours as needed for headache or mild pain.    Marland Kitchen ELIQUIS 5 MG TABS tablet TAKE 1 TABLET BY MOUTH TWICE A DAY 027 tablet 1  . folic acid (FOLVITE) 1 MG tablet Take 1 mg by mouth every morning.     . lansoprazole (PREVACID) 30 MG capsule Take 30 mg by mouth every morning.     . methotrexate (RHEUMATREX) 5 MG tablet Take 5 mg by mouth  once a week. Caution: Chemotherapy. Protect from light.    . Multiple Vitamins-Minerals (CENTRUM SILVER ADULT 50+) TABS Take 50 mg by mouth daily.    Marland Kitchen oxyCODONE (OXY IR/ROXICODONE) 5 MG immediate release tablet Take 1-2 tablets (5-10 mg total) by mouth every 6 (six) hours as needed for severe pain. (Patient not taking: Reported on 04/17/2018) 30 tablet 0   No current facility-administered medications for this visit.     Allergies  Allergen Reactions  . No Known Allergies Other (See Comments)    Review of Systems  Weight stable No chest pain No hemoptysis No ankle edema Soreness the right posterior chest where the old VATS incision is  BP 115/69   Pulse 88   Resp 20   Ht 5\' 9"  (1.753 m)   Wt 240 lb (108.9 kg)   SpO2 94%   BMI 35.44 kg/m  Physical Exam      Exam    General- alert and comfortable    Neck- no JVD, no cervical adenopathy palpable, no carotid bruit   Lungs- clear without rales, wheezes   Cor- regular rate and rhythm, no murmur , gallop   Abdomen- soft, non-tender   Extremities - warm, non-tender, minimal edema   Neuro- oriented, appropriate, no focal weakness   Diagnostic Tests: CT scan images reviewed and shared with patient.  No evidence of recurrent cancer 2 years status post resection right lower lobe squamous cell carcinoma  Impression: Patient will need surveillance scans every year for 5 years following resection of right lower lobe squamous cell carcinoma 2017  Plan: Return in 1 year with CT scan.  Len Childs, MD [facility] [phone]

## 2018-07-29 ENCOUNTER — Other Ambulatory Visit: Payer: Self-pay | Admitting: Cardiovascular Disease

## 2018-08-28 ENCOUNTER — Other Ambulatory Visit (HOSPITAL_COMMUNITY): Payer: Medicare Other

## 2018-08-28 ENCOUNTER — Ambulatory Visit: Payer: Medicare Other | Admitting: Vascular Surgery

## 2018-08-30 ENCOUNTER — Encounter: Payer: Self-pay | Admitting: Cardiovascular Disease

## 2018-08-30 ENCOUNTER — Ambulatory Visit: Payer: Medicare Other | Admitting: Cardiovascular Disease

## 2018-08-30 VITALS — BP 123/72 | HR 86 | Ht 69.0 in | Wt 237.2 lb

## 2018-08-30 DIAGNOSIS — I2510 Atherosclerotic heart disease of native coronary artery without angina pectoris: Secondary | ICD-10-CM

## 2018-08-30 DIAGNOSIS — I700 Atherosclerosis of aorta: Secondary | ICD-10-CM | POA: Diagnosis not present

## 2018-08-30 DIAGNOSIS — I714 Abdominal aortic aneurysm, without rupture, unspecified: Secondary | ICD-10-CM

## 2018-08-30 DIAGNOSIS — C3431 Malignant neoplasm of lower lobe, right bronchus or lung: Secondary | ICD-10-CM

## 2018-08-30 DIAGNOSIS — Z7901 Long term (current) use of anticoagulants: Secondary | ICD-10-CM

## 2018-08-30 DIAGNOSIS — I4892 Unspecified atrial flutter: Secondary | ICD-10-CM | POA: Diagnosis not present

## 2018-08-30 NOTE — Progress Notes (Signed)
Patient ID: Jeffrey Stephens, male   DOB: 1941-07-13, 77 y.o.   MRN: [id number]    Cardiology Office Note    Date:  08/30/2018   ID:  Jeffrey Stephens, Jeffrey Stephens 1940-12-28, MRN [id number]  PCP:  Lavone Orn, MD  Cardiologist:   Sanda Klein, MD   Chief Complaint  Patient presents with  . Atrial Flutter follow up    History of Present Illness:  Jeffrey Stephens is a 77 y.o. male with a history of recurrent persistent atrial flutter that responded well to sotalol therapy, but medication stopped due to bradycardia and near-syncope.  After stopping the sotalol he has become asymptomatic.  He has declined pacemaker implantation.  He has not had any clinically evident recurrence of flutter or atrial fibrillation since stopping sotalol.  Is compliant with Eliquis anticoagulation and has not had any embolic events or bleeding complications.  Eliquis is also indicated for remote history of DVT of the lower extremities.  She complains of shortness of breath with activity such as climbing 1 flight of stairs, but this does not interfere with her daily activities.  He does not think that his breathing has changed since his last appointment.  Otherwise denies any cardiovascular complaints.The patient specifically denies any chest pain at rest or with exertion, orthopnea, paroxysmal nocturnal dyspnea, syncope, palpitations, focal neurological deficits, intermittent claudication, lower extremity edema, unexplained weight gain, cough, hemoptysis or wheezing.  Roughly 2 years ago he underwent wedge resection of a right lower lobe stage I squamous cell carcinoma with curative intent. The chest CT showed a 4 cm ascending aortic aneurysm, but a follow up CT August 2018 describes the aorta as non-aneurysmal, 3.8 cm. He has a small fusiform AAA, 3.5 cm at last [place] May 2018.  He is due for repeat ultrasound and follow-up visit with Dr. Scot Dock next week.   He initially presented with paroxysmal atrial flutter in August  2015 in the setting of recent left knee surgery. There was evidence of normal left ventricular systolic function by both echo and nuclear scintigraphy, he did not have significant valvular disease in his left atrium was only mildly dilated. He has calcified coronaries on CT of the chest but has never had angina. A nuclear study was interpreted as showing possible inferior wall reversible defect (question combination of diaphragmatic attenuation artifact and excessive intestinal activity). He was hospitalized in November 2015 with atrial flutter with rapid ventricular response and converted on treatment with sotalol. As far as we know he has never had any embolic events. He has a remote history of deep venous thrombosis of the lower extremities, AAA, brain aneurysm complicated by secondary hemorrhagic stroke but treated with successful clipping in the past.  Past Medical History:  Diagnosis Date  . AAA (abdominal aortic aneurysm) ([place])    a. followed by Dr. Deitra Mayo - 01/2016 CT: 3.6 cm infrarenal AAA.  Marland Kitchen Abnormal nuclear cardiac imaging test 07/13/2014   a. 06/2014 MV: possible reversible inferior wall defect,no wma, nl EF, felt to be artifact.  . Basal cell carcinoma    "LLE; some on my head"  . BPH (benign prostatic hyperplasia)   . Cerebral aneurysm 1994  . Constipation   . Depression   . DVT (deep venous thrombosis) ([place]) 2001   Hx of Left leg   . Dysrhythmia    AFIB   . Frequency of urination   . GERD (gastroesophageal reflux disease)   . History of echocardiogram    a. 2D ECHO: 06/11/2014:  EF 55-60%. Normal wall thickness. Indeterminate DD ( atrial flutter ). No RWMA. Mild LA dilation. Normal RV size and systolic function. No significant valvular abnormalities.  . History of palpitations    OCCASIONAL  . Kidney stones   . Lung nodule    a. 02/2016 PET scan: intensely hypermetabolic 2.0 x 1.5 cm central RLL nodule (SUV 23).  . Paroxysmal atrial flutter ([place])    a. On eliquis  and sotalol (CHA2DS2VASc = 1).  . Rheumatoid arthritis ([place])    "qwhere"  . Stroke [facility]) 1994   "when I had ruptured aneurysm in my head"  . TIA (transient ischemic attack)    1994 WITH ANEYRYSM    Past Surgical History:  Procedure Laterality Date  . BASAL CELL CARCINOMA EXCISION     "LLE; top of my head"  . CEREBRAL ANEURYSM REPAIR  1994   Hx of ruptured brain aneurysm Tx by Dr. Ellene Route  . CYSTOSCOPY W/ STONE MANIPULATION  1980's X 1  . EXCISIONAL HEMORRHOIDECTOMY  1970's  . JOINT REPLACEMENT     LT KNEE  . TOTAL KNEE ARTHROPLASTY Left 05/14/2014   Procedure: LEFT TOTAL KNEE ARTHROPLASTY;  Surgeon: Johnn Hai, MD;  Location: [facility] ORS;  Service: Orthopedics;  Laterality: Left;  Marland Kitchen VIDEO ASSISTED THORACOSCOPY (VATS)/WEDGE RESECTION Right 05/08/2016   Procedure: VIDEO ASSISTED THORACOSCOPY (VATS)/LUNG RESECTION;  Surgeon: Ivin Poot, MD;  Location: [facility] OR;  Service: Thoracic;  Laterality: Right;    Current Medications: Outpatient Medications Prior to Visit  Medication Sig Dispense Refill  . acetaminophen (TYLENOL) 325 MG tablet Take 2 tablets (650 mg total) by mouth every 4 (four) hours as needed for headache or mild pain.    Marland Kitchen ELIQUIS 5 MG TABS tablet TAKE 1 TABLET BY MOUTH TWICE A DAY 509 tablet 1  . folic acid (FOLVITE) 1 MG tablet Take 1 mg by mouth every morning.     . lansoprazole (PREVACID) 30 MG capsule Take 30 mg by mouth every morning.     . methotrexate (RHEUMATREX) 5 MG tablet Take 5 mg by mouth once a week. Caution: Chemotherapy. Protect from light.    . Multiple Vitamins-Minerals (CENTRUM SILVER ADULT 50+) TABS Take 50 mg by mouth daily.    Marland Kitchen oxyCODONE (OXY IR/ROXICODONE) 5 MG immediate release tablet Take 1-2 tablets (5-10 mg total) by mouth every 6 (six) hours as needed for severe pain. 30 tablet 0   No facility-administered medications prior to visit.      Allergies:   No known allergies   Social History   Socioeconomic History  . Marital status: Widowed      Spouse name: Not on file  . Number of children: Not on file  . Years of education: Not on file  . Highest education level: Not on file  Occupational History  . Not on file  Social Needs  . Financial resource strain: Not on file  . Food insecurity:    Worry: Not on file    Inability: Not on file  . Transportation needs:    Medical: Not on file    Non-medical: Not on file  Tobacco Use  . Smoking status: Former Smoker    Packs/day: 2.50    Years: 37.00    Pack years: 92.50    Types: Cigarettes    Last attempt to quit: 09/25/1992    Years since quitting: 25.9  . Smokeless tobacco: Never Used  Substance and Sexual Activity  . Alcohol use: No    Alcohol/week: 0.0  standard drinks  . Drug use: No  . Sexual activity: Not Currently  Lifestyle  . Physical activity:    Days per week: Not on file    Minutes per session: Not on file  . Stress: Not on file  Relationships  . Social connections:    Talks on phone: Not on file    Gets together: Not on file    Attends religious service: Not on file    Active member of club or organization: Not on file    Attends meetings of clubs or organizations: Not on file    Relationship status: Not on file  Other Topics Concern  . Not on file  Social History Narrative  . Not on file     Family History:  The patient's family history includes Cancer in his brother; Diabetes in his father; Heart attack in his father; Heart disease in his brother and father.   ROS:   Please see the history of present illness.    ROS All other systems reviewed and are negative.   PHYSICAL EXAM:   VS:  BP 123/72   Pulse 86   Ht 5\' 9"  (1.753 m)   Wt 237 lb 3.2 oz (107.6 kg)   BMI 35.03 kg/m      General: Alert, oriented x3, no distress, obese Head: no evidence of trauma, PERRL, EOMI, no exophtalmos or lid lag, no myxedema, no xanthelasma; normal ears, nose and oropharynx Neck: normal jugular venous pulsations and no hepatojugular reflux; brisk carotid  pulses without delay and no carotid bruits Chest: clear to auscultation, no signs of consolidation by percussion or palpation, normal fremitus, symmetrical and full respiratory excursions Cardiovascular: normal position and quality of the apical impulse, regular rhythm, normal first and second heart sounds, no murmurs, rubs or gallops Abdomen: no tenderness or distention, no masses by palpation, no abnormal pulsatility or arterial bruits, normal bowel sounds, no hepatosplenomegaly Extremities: no clubbing, cyanosis or edema; 2+ radial, ulnar and brachial pulses bilaterally; 2+ right femoral, posterior tibial and dorsalis pedis pulses; 2+ left femoral, posterior tibial and dorsalis pedis pulses; no subclavian or femoral bruits Neurological: grossly nonfocal Psych: Normal mood and affect   Wt Readings from Last 3 Encounters:  08/30/18 237 lb 3.2 oz (107.6 kg)  04/17/18 240 lb (108.9 kg)  08/28/17 239 lb 9.6 oz (108.7 kg)      Studies/Labs Reviewed:   EKG:  EKG is ordered today.  The ekg ordered today demonstrates NSR, vertical QRS axis, otherwise normal tracing  Lipid Panel    Component Value Date/Time   CHOL 95 06/12/2014 0050   TRIG 80 06/12/2014 0050   HDL 32 (L) 06/12/2014 0050   CHOLHDL 3.0 06/12/2014 0050   VLDL 16 06/12/2014 0050   LDLCALC 47 06/12/2014 0050     ASSESSMENT:    1. Atrial flutter, paroxysmal ([place])   2. Long term current use of anticoagulant   3. Coronary artery calcification seen on CT scan   4. Abdominal aortic aneurysm (AAA) without rupture ([place])   5. Aortic atherosclerosis ([place])   6. Malignant neoplasm of lower lobe of right lung (H[facility])      PLAN:  In order of problems listed above:  1. AFlutter: At least from a symptom point of view, he has not had any recurrence of atrial flutter since we stopped the sotalol.  (could be having episodes with spontaneously controlled ventricular rate that are asymptomatic). No near syncope recurrence since stopping  sotalol.  Avoid negative chronotropic agents  due to his history of symptomatic bradycardia. 2. Eliquis: CHADS Vasc 3 (age 72, vascular disease).  Compliant with anticoagulation.  No bleeding complications. 3. Coronary Ca: Low risk nuclear study in 2015. Asymptomatic.  4. AAA: Ultrasound in November 2016 - small, only measures 3.6 cm in maximum diameter, mildly increased compared to 2015 (3.0 in 2012). Recheck in a year or so. 5. Ao atherosclerosis: 4 cm ascending aneurysm reported on CT 05/2016 during f/u for lung neoplasm, but not confirmed and measured at 3.8 cm on follow up CT in 04/2017 and also not mentioned on the last CT in July 2019. 6. S/P lung Ca surgery: This probably explains his mild exertional dyspnea.  Symptoms have been stable.  Weight loss would be beneficial. 7. HLP: Need an updated lipid profile.  He does have evidence of atherosclerosis in his thoracic and abdominal aorta and in his coronaries, but the most recent LDL cholesterol was only 47 (performed in 2015).  He has not gained substantial weight since then.   Medication Adjustments/Labs and Tests Ordered: Current medicines are reviewed at length with the patient today.  Concerns regarding medicines are outlined above.  Medication changes, Labs and Tests ordered today are listed in the Patient Instructions below. Patient Instructions  Medication Instructions:  Dr [name] recommends that you continue on your current medications as directed. Please refer to the Current Medication list given to you today.  If you need a refill on your cardiac medications before your next appointment, please call your pharmacy.   Follow-Up: At [facility], you and your health needs are our priority.  As part of our continuing mission to provide you with exceptional heart care, we have created designated Provider Care Teams.  These Care Teams include your primary Cardiologist (physician) and Advanced Practice Providers (APPs -  Physician  Assistants and Nurse Practitioners) who all work together to provide you with the care you need, when you need it. You will need a follow up appointment in 12 months. You may see Sanda Klein, MD or one of the following Advanced Practice Providers on your designated Care Team: [place], [place] . Fabian Sharp, PA-C . You will receive a reminder letter in the mail two months in advance. If you don't receive a letter, please call our office to schedule the follow-up appointment.    Signed, Sanda Klein, MD  08/30/2018 10:15 AM    [facility] [address], [place], [place]  [address] Phone: [phone]; Fax: [phone]

## 2018-08-30 NOTE — Patient Instructions (Signed)
Medication Instructions:  Dr [name] recommends that you continue on your current medications as directed. Please refer to the Current Medication list given to you today.  If you need a refill on your cardiac medications before your next appointment, please call your pharmacy.   Follow-Up: At [facility], you and your health needs are our priority.  As part of our continuing mission to provide you with exceptional heart care, we have created designated Provider Care Teams.  These Care Teams include your primary Cardiologist (physician) and Advanced Practice Providers (APPs -  Physician Assistants and Nurse Practitioners) who all work together to provide you with the care you need, when you need it. You will need a follow up appointment in 12 months. You may see Sanda Klein, MD or one of the following Advanced Practice Providers on your designated Care Team: [place], [place] . Fabian Sharp, PA-C . You will receive a reminder letter in the mail two months in advance. If you don't receive a letter, please call our office to schedule the follow-up appointment.

## 2018-09-04 ENCOUNTER — Encounter: Payer: Self-pay | Admitting: Vascular Surgery

## 2018-09-04 ENCOUNTER — Other Ambulatory Visit: Payer: Self-pay

## 2018-09-04 ENCOUNTER — Ambulatory Visit (HOSPITAL_COMMUNITY)
Admission: RE | Admit: 2018-09-04 | Discharge: 2018-09-04 | Disposition: A | Discharge status: Home / Self Care | Payer: Medicare Other | Source: Ambulatory Visit | Attending: Vascular Surgery | Admitting: Vascular Surgery

## 2018-09-04 ENCOUNTER — Ambulatory Visit (INDEPENDENT_AMBULATORY_CARE_PROVIDER_SITE_OTHER): Payer: Medicare Other | Admitting: Vascular Surgery

## 2018-09-04 VITALS — BP 114/75 | HR 69 | Temp 97.6°F | Resp 16 | Ht 69.0 in | Wt 235.0 lb

## 2018-09-04 DIAGNOSIS — I714 Abdominal aortic aneurysm, without rupture, unspecified: Secondary | ICD-10-CM

## 2018-09-04 NOTE — Progress Notes (Signed)
Patient name: Jeffrey Stephens MRN: [id number] DOB: 1941-05-13 Sex: male  REASON FOR VISIT:   Follow-up of abdominal aortic aneurysm  HPI:   Jeffrey Stephens is a pleasant 77 y.o. male who I last saw on 02/21/2017.  I have been following him with an abdominal aortic aneurysm.  At the time of his last visit the aneurysm measured 3.6 cm in maximum diameter.  The right common iliac artery measured 1.35 cm in maximum diameter.  The left common iliac artery measures 1.5 cm in maximum diameter.  I set him up for an 9-month follow-up visit.  Since I saw him last, he denies any abdominal pain or back pain.  He is not a smoker.  His blood pressure has been under good control.  There have been no significant changes to his medical history.  Past Medical History:  Diagnosis Date  . AAA (abdominal aortic aneurysm) ([place])    a. followed by Dr. Deitra Mayo - 01/2016 CT: 3.6 cm infrarenal AAA.  Marland Kitchen Abnormal nuclear cardiac imaging test 07/13/2014   a. 06/2014 MV: possible reversible inferior wall defect,no wma, nl EF, felt to be artifact.  . Basal cell carcinoma    "LLE; some on my head"  . BPH (benign prostatic hyperplasia)   . Cerebral aneurysm 1994  . Constipation   . Depression   . DVT (deep venous thrombosis) ([place]) 2001   Hx of Left leg   . Dysrhythmia    AFIB   . Frequency of urination   . GERD (gastroesophageal reflux disease)   . History of echocardiogram    a. 2D ECHO: 06/11/2014: EF 55-60%. Normal wall thickness. Indeterminate DD ( atrial flutter ). No RWMA. Mild LA dilation. Normal RV size and systolic function. No significant valvular abnormalities.  . History of palpitations    OCCASIONAL  . Kidney stones   . Lung nodule    a. 02/2016 PET scan: intensely hypermetabolic 2.0 x 1.5 cm central RLL nodule (SUV 23).  . Paroxysmal atrial flutter ([place])    a. On eliquis and sotalol (CHA2DS2VASc = 1).  . Rheumatoid arthritis ([place])    "qwhere"  . Stroke [facility]) 1994   "when I had  ruptured aneurysm in my head"  . TIA (transient ischemic attack)    1994 WITH ANEYRYSM    Family History  Problem Relation Age of Onset  . Heart disease Father        Aneurysm  . Diabetes Father   . Heart attack Father   . Heart disease Brother        Heart Disease before age 73  . Cancer Brother   . Hypertension Neg Hx   . Stroke Neg Hx     SOCIAL HISTORY: Social History   Tobacco Use  . Smoking status: Former Smoker    Packs/day: 2.50    Years: 37.00    Pack years: 92.50    Types: Cigarettes    Last attempt to quit: 09/25/1992    Years since quitting: 25.9  . Smokeless tobacco: Never Used  Substance Use Topics  . Alcohol use: No    Alcohol/week: 0.0 standard drinks    Allergies  Allergen Reactions  . No Known Allergies Other (See Comments)    Current Outpatient Medications  Medication Sig Dispense Refill  . acetaminophen (TYLENOL) 325 MG tablet Take 2 tablets (650 mg total) by mouth every 4 (four) hours as needed for headache or mild pain.    Marland Kitchen ELIQUIS 5 MG TABS tablet  TAKE 1 TABLET BY MOUTH TWICE A DAY 585 tablet 1  . folic acid (FOLVITE) 1 MG tablet Take 1 mg by mouth every morning.     . lansoprazole (PREVACID) 30 MG capsule Take 30 mg by mouth every morning.     . methotrexate (RHEUMATREX) 2.5 MG tablet TAKE 5 TABLETS BY MOUTH ONCE A WEEK ON MONDAYS  1  . methotrexate (RHEUMATREX) 5 MG tablet Take 5 mg by mouth once a week. Caution: Chemotherapy. Protect from light.    . Multiple Vitamins-Minerals (CENTRUM SILVER ADULT 50+) TABS Take 50 mg by mouth daily.    Marland Kitchen oxycodone-acetaminophen (PERCOCET) 2.5-325 MG tablet TAKE 1 TABLET BY MOUTH EVERY DAY AS NEEDED FOR 7 DAY  0  . tamsulosin (FLOMAX) 0.4 MG CAPS capsule Take 0.4 mg by mouth daily.  11  . oxyCODONE (OXY IR/ROXICODONE) 5 MG immediate release tablet Take 1-2 tablets (5-10 mg total) by mouth every 6 (six) hours as needed for severe pain. (Patient not taking: Reported on 09/04/2018) 30 tablet 0   No current  facility-administered medications for this visit.     REVIEW OF SYSTEMS:  [X]  denotes positive finding, [ ]  denotes negative finding Cardiac  Comments:  Chest pain or chest pressure:    Shortness of breath upon exertion:    Short of breath when lying flat:    Irregular heart rhythm:        Vascular    Pain in calf, thigh, or hip brought on by ambulation:    Pain in feet at night that wakes you up from your sleep:     Blood clot in your veins:    Leg swelling:         Pulmonary    Oxygen at home:    Productive cough:     Wheezing:         Neurologic    Sudden weakness in arms or legs:     Sudden numbness in arms or legs:     Sudden onset of difficulty speaking or slurred speech:    Temporary loss of vision in one eye:     Problems with dizziness:         Gastrointestinal    Blood in stool:     Vomited blood:         Genitourinary    Burning when urinating:     Blood in urine:        Psychiatric    Major depression:         Hematologic    Bleeding problems:    Problems with blood clotting too easily:        Skin    Rashes or ulcers:        Constitutional    Fever or chills:     PHYSICAL EXAM:   Vitals:   09/04/18 0848  BP: 114/75  Pulse: 69  Resp: 16  Temp: 97.6 F (36.4 C)  TempSrc: Oral  SpO2: 94%  Weight: 235 lb (106.6 kg)  Height: 5\' 9"  (1.753 m)    GENERAL: The patient is a well-nourished male, in no acute distress. The vital signs are documented above. CARDIAC: There is a regular rate and rhythm.  VASCULAR: I do not detect carotid bruits. He has palpable dorsalis pedis pulses bilaterally. He has no significant lower extremity swelling. He has telangiectasias bilaterally. PULMONARY: There is good air exchange bilaterally without wheezing or rales. ABDOMEN: Soft and non-tender with normal pitched bowel sounds.  I cannot palpate his  aneurysm given his size. MUSCULOSKELETAL: There are no major deformities or cyanosis. NEUROLOGIC: No focal  weakness or paresthesias are detected. SKIN: There are no ulcers or rashes noted. PSYCHIATRIC: The patient has a normal affect.  DATA:    DUPLEX ABDOMINAL AORTA: I have independently interpreted his duplex of the abdominal aorta.  The maximum diameter of his aneurysm is 3.87 cm.  The right common iliac artery measures 1.6 cm in maximum diameter.  The left common iliac artery measures 1.2 cm in maximum diameter.  This is not changed significantly compared to the study 18 months ago.  MEDICAL ISSUES:   3.9 CM INFRARENAL ABDOMINAL AORTIC ANEURYSM: His aneurysm is increased only slightly in size over the last 18 months.  He understands we would not consider elective repair unless this reached 5.5 cm in maximum diameter.  I think it is unlikely that this will ever need to be addressed.  He is not a smoker and his blood pressure is under good control.  I have encouraged him to stay as active as possible.  I have ordered a follow-up duplex in 18 months and I will see him back at that time.  He knows to call sooner if he has problems.  Deitra Mayo [facility] [phone]

## 2019-01-23 ENCOUNTER — Other Ambulatory Visit: Payer: Self-pay | Admitting: Cardiovascular Disease

## 2019-01-23 NOTE — Telephone Encounter (Signed)
78yo 106.6kg OV with Dr C on 08/30/2018 Scr = 1.19 on 08/30/2018

## 2019-01-28 ENCOUNTER — Other Ambulatory Visit: Payer: Self-pay | Admitting: Cardiothoracic Surgery

## 2019-01-28 DIAGNOSIS — R911 Solitary pulmonary nodule: Secondary | ICD-10-CM

## 2019-01-28 DIAGNOSIS — C3430 Malignant neoplasm of lower lobe, unspecified bronchus or lung: Secondary | ICD-10-CM

## 2019-04-09 ENCOUNTER — Encounter: Payer: Self-pay | Admitting: Cardiothoracic Surgery

## 2019-04-09 ENCOUNTER — Other Ambulatory Visit: Payer: Self-pay

## 2019-04-09 ENCOUNTER — Ambulatory Visit
Admission: RE | Admit: 2019-04-09 | Discharge: 2019-04-09 | Disposition: A | Discharge status: Home / Self Care | Payer: Medicare Other | Source: Ambulatory Visit | Attending: Cardiothoracic Surgery | Admitting: Cardiothoracic Surgery

## 2019-04-09 ENCOUNTER — Ambulatory Visit: Payer: Medicare Other | Admitting: Cardiothoracic Surgery

## 2019-04-09 ENCOUNTER — Other Ambulatory Visit: Payer: Self-pay | Admitting: Cardiothoracic Surgery

## 2019-04-09 VITALS — BP 135/75 | HR 79 | Temp 97.8°F | Resp 20 | Ht 69.0 in | Wt 235.0 lb

## 2019-04-09 DIAGNOSIS — R911 Solitary pulmonary nodule: Secondary | ICD-10-CM

## 2019-04-09 DIAGNOSIS — Z85118 Personal history of other malignant neoplasm of bronchus and lung: Secondary | ICD-10-CM | POA: Diagnosis not present

## 2019-04-09 NOTE — Progress Notes (Signed)
PCP is Lavone Orn, MD Referring Provider is Lavone Orn, MD  Chief Complaint  Patient presents with  . Lung Cancer    1 year f/u with Chest CT    HPI: Patient returns for scheduled one-year follow-up with CT scan of the chest after resection of a squamous cell carcinoma right lower lobe 2017.  Pathologic stage I.  He is a reformed smoker.  He denies chest pain or hemoptysis.  He does have dyspnea on exertion from his COPD with known diffusion capacity 43%. Review of the CT scan of the chest today shows no evidence of recurrent disease or new at rest nodules.  He has changes of COPD and some tiny granulomatous nodules which have not changed over the years.  Calcified coronary disease is reported on the CT scan report but he denies symptoms of angina.  Past Medical History:  Diagnosis Date  . AAA (abdominal aortic aneurysm) ([place])    a. followed by Dr. Deitra Mayo - 01/2016 CT: 3.6 cm infrarenal AAA.  Marland Kitchen Abnormal nuclear cardiac imaging test 07/13/2014   a. 06/2014 MV: possible reversible inferior wall defect,no wma, nl EF, felt to be artifact.  . Basal cell carcinoma    "LLE; some on my head"  . BPH (benign prostatic hyperplasia)   . Cerebral aneurysm 1994  . Constipation   . Depression   . DVT (deep venous thrombosis) ([place]) 2001   Hx of Left leg   . Dysrhythmia    AFIB   . Frequency of urination   . GERD (gastroesophageal reflux disease)   . History of echocardiogram    a. 2D ECHO: 06/11/2014: EF 55-60%. Normal wall thickness. Indeterminate DD ( atrial flutter ). No RWMA. Mild LA dilation. Normal RV size and systolic function. No significant valvular abnormalities.  . History of palpitations    OCCASIONAL  . Kidney stones   . Lung nodule    a. 02/2016 PET scan: intensely hypermetabolic 2.0 x 1.5 cm central RLL nodule (SUV 23).  . Paroxysmal atrial flutter ([place])    a. On eliquis and sotalol (CHA2DS2VASc = 1).  . Rheumatoid arthritis ([place])    "qwhere"  . Stroke [facility])  1994   "when I had ruptured aneurysm in my head"  . TIA (transient ischemic attack)    1994 WITH ANEYRYSM    Past Surgical History:  Procedure Laterality Date  . BASAL CELL CARCINOMA EXCISION     "LLE; top of my head"  . CEREBRAL ANEURYSM REPAIR  1994   Hx of ruptured brain aneurysm Tx by Dr. Ellene Route  . CYSTOSCOPY W/ STONE MANIPULATION  1980's X 1  . EXCISIONAL HEMORRHOIDECTOMY  1970's  . JOINT REPLACEMENT     LT KNEE  . TOTAL KNEE ARTHROPLASTY Left 05/14/2014   Procedure: LEFT TOTAL KNEE ARTHROPLASTY;  Surgeon: Johnn Hai, MD;  [facility]: [facility];  Service: Orthopedics;  Laterality: Left;  Marland Kitchen VIDEO ASSISTED THORACOSCOPY (VATS)/WEDGE RESECTION Right 05/08/2016   Procedure: VIDEO ASSISTED THORACOSCOPY (VATS)/LUNG RESECTION;  Surgeon: Ivin Poot, MD;  Location: [facility];  Service: Thoracic;  Laterality: Right;    Family History  Problem Relation Age of Onset  . Heart disease Father        Aneurysm  . Diabetes Father   . Heart attack Father   . Heart disease Brother        Heart Disease before age 68  . Cancer Brother   . Hypertension Neg Hx   . Stroke Neg Hx  Social History Social History   Tobacco Use  . Smoking status: Former Smoker    Packs/day: 2.50    Years: 37.00    Pack years: 92.50    Types: Cigarettes    Quit date: 09/25/1992    Years since quitting: 26.5  . Smokeless tobacco: Never Used  Substance Use Topics  . Alcohol use: No    Alcohol/week: 0.0 standard drinks  . Drug use: No    Current Outpatient Medications  Medication Sig Dispense Refill  . acetaminophen (TYLENOL) 325 MG tablet Take 2 tablets (650 mg total) by mouth every 4 (four) hours as needed for headache or mild pain.    Marland Kitchen ELIQUIS 5 MG TABS tablet TAKE 1 TABLET BY MOUTH TWICE A DAY 465 tablet 1  . folic acid (FOLVITE) 1 MG tablet Take 1 mg by mouth every morning.     . lansoprazole (PREVACID) 30 MG capsule Take 30 mg by mouth every morning.     . methotrexate (RHEUMATREX) 2.5 MG tablet  TAKE 5 TABLETS BY MOUTH ONCE A WEEK ON MONDAYS  1  . methotrexate (RHEUMATREX) 5 MG tablet Take 5 mg by mouth once a week. Caution: Chemotherapy. Protect from light.    . Multiple Vitamins-Minerals (CENTRUM SILVER ADULT 50+) TABS Take 50 mg by mouth daily.    Marland Kitchen oxycodone-acetaminophen (PERCOCET) 2.5-325 MG tablet TAKE 1 TABLET BY MOUTH EVERY DAY AS NEEDED FOR 7 DAY  0  . tamsulosin (FLOMAX) 0.4 MG CAPS capsule Take 0.4 mg by mouth daily.  11  . oxyCODONE (OXY IR/ROXICODONE) 5 MG immediate release tablet Take 1-2 tablets (5-10 mg total) by mouth every 6 (six) hours as needed for severe pain. (Patient not taking: Reported on 04/09/2019) 30 tablet 0   No current facility-administered medications for this visit.     Allergies  Allergen Reactions  . No Known Allergies Other (See Comments)    Review of Systems  Tries to walk 20 minutes most days around his property Weight is up about 5 pounds No exposure to COVID virus, no symptoms of COVID virus No orthopnea PND or ankle edema No abdominal pain No dizziness syncope or falls No palpitations  BP 135/75   Pulse 79   Temp 97.8 F (36.6 C) (Skin)   Resp 20   Ht 5\' 9"  (1.753 m)   Wt 235 lb (106.6 kg)   SpO2 90% Comment: RA  BMI 34.70 kg/m  Physical Exam      Exam    General- alert and comfortable    Neck- no JVD, no cervical adenopathy palpable, no carotid bruit   Lungs- clear without rales, wheezes   Cor- regular rate and rhythm, no murmur , gallop   Abdomen- soft, non-tender   Extremities - warm, non-tender, minimal edema   Neuro- oriented, appropriate, no focal weakness  Diagnostic Tests: CT scan images personally viewed and discussed with patient No evidence of recurrent disease or new at risk pulmonary nodules  Impression: Patient doing well 3 years after resection of right lower lobe squamous cell carcinoma stage I.  Continue with surveillance scans every year.  Plan: Return in 1 year with CT scan of chest.   Len Childs, MD [facility] [phone]

## 2019-05-15 ENCOUNTER — Telehealth: Payer: Self-pay | Admitting: Cardiovascular Disease

## 2019-05-15 NOTE — Telephone Encounter (Signed)
° °  [facility] Pre-operative Risk Assessment    Request for surgical clearance:  1. What type of surgery is being performed? REzum of the PROSTAT  2. When is this surgery scheduled? Pending clearance   3. What type of clearance is required (medical clearance vs. Pharmacy clearance to hold med vs. Both)? BOTH  4. Are there any medications that need to be held prior to surgery and how long? ELEQUIS  2 to 3 days prior  5. Practice name and name of physician performing surgery? [facility]  6. What is your office phone number [phone]   7.   What is your office fax number [phone]   8.   Anesthesia type (None, local, MAC, general) ? LOCAL   NITROUS   Simeon Craft V 7/89/7847, 10:00 AM  _________________________________________________________________   (provider comments below)

## 2019-05-15 NOTE — Telephone Encounter (Signed)
   Primary Cardiologist:[name], MD  Chart reviewed as part of pre-operative protocol coverage. Because of Jeffrey Stephens past medical history and time since last visit, he/she will require a follow-up visit in order to better assess preoperative cardiovascular risk.  Pre-op covering staff: - Please schedule appointment and call patient to inform them. - Please contact requesting surgeon's office via preferred method (i.e, phone, fax) to inform them of need for appointment prior to surgery.   Kathyrn Drown, NP  05/15/2019, 12:42 PM

## 2019-05-15 NOTE — Telephone Encounter (Signed)
Pt has been scheduled to see Dr. [name], 06/03/2019. Will route back to requesting surgeon's office to make them aware.

## 2019-05-15 NOTE — Telephone Encounter (Signed)
Patient with diagnosis of afib on Eliquis for anticoagulation.    Of note patient has a remote history of DVT and stroke due to rupture aneurysm  Procedure: REzum of the PROSTAT Date of procedure: TBD  Crcl 33ml/min  CHADS2-VASc score of  5 (AGE, stroke/tia x 2, CAD, AGE, )  Office policy would state patient can hold anticoagulation 2 days prior to procedure, however patient is at increased risk off anticoagulation due to hx of DVT and stroke. I will route to MD for input.

## 2019-05-20 ENCOUNTER — Encounter: Payer: Self-pay | Admitting: Cardiovascular Disease

## 2019-05-20 NOTE — Telephone Encounter (Signed)
I spoke with Jeffrey Stephens. I think this is a low risk procedure. I am sending a letter to the Urologist and you can cancel his Sept 9 appt (please schedule his 12 month appt in December).

## 2019-05-20 NOTE — Telephone Encounter (Signed)
The patient has been made aware and verbalized his understanding.

## 2019-06-03 ENCOUNTER — Ambulatory Visit: Payer: Medicare Other | Admitting: Cardiovascular Disease

## 2019-06-16 ENCOUNTER — Other Ambulatory Visit: Payer: Self-pay | Admitting: Cardiovascular Disease

## 2019-06-17 NOTE — Telephone Encounter (Signed)
62m 106.6kg Scr 1.2 05/30/18 Lovw/croitoru 08/30/18

## 2019-07-08 ENCOUNTER — Other Ambulatory Visit: Payer: Self-pay | Admitting: *Deleted

## 2019-07-08 DIAGNOSIS — Z20822 Contact with and (suspected) exposure to covid-19: Secondary | ICD-10-CM

## 2019-07-10 LAB — NOVEL CORONAVIRUS, NAA: SARS-CoV-2, NAA: NOT DETECTED

## 2019-09-05 ENCOUNTER — Ambulatory Visit: Payer: Medicare Other | Admitting: Cardiovascular Disease

## 2019-09-17 ENCOUNTER — Encounter (INDEPENDENT_AMBULATORY_CARE_PROVIDER_SITE_OTHER): Payer: Self-pay

## 2019-09-17 ENCOUNTER — Ambulatory Visit: Payer: Medicare Other | Admitting: Cardiovascular Disease

## 2019-09-17 ENCOUNTER — Encounter: Payer: Self-pay | Admitting: Cardiovascular Disease

## 2019-09-17 ENCOUNTER — Other Ambulatory Visit: Payer: Self-pay

## 2019-09-17 VITALS — BP 140/62 | HR 84 | Ht 69.0 in | Wt 237.0 lb

## 2019-09-17 DIAGNOSIS — I4892 Unspecified atrial flutter: Secondary | ICD-10-CM

## 2019-09-17 DIAGNOSIS — Z902 Acquired absence of lung [part of]: Secondary | ICD-10-CM

## 2019-09-17 DIAGNOSIS — I2510 Atherosclerotic heart disease of native coronary artery without angina pectoris: Secondary | ICD-10-CM

## 2019-09-17 DIAGNOSIS — I714 Abdominal aortic aneurysm, without rupture, unspecified: Secondary | ICD-10-CM

## 2019-09-17 DIAGNOSIS — Z7901 Long term (current) use of anticoagulants: Secondary | ICD-10-CM

## 2019-09-17 DIAGNOSIS — I700 Atherosclerosis of aorta: Secondary | ICD-10-CM

## 2019-09-17 NOTE — Patient Instructions (Signed)
Medication Instructions:  No changes *If you need a refill on your cardiac medications before your next appointment, please call your pharmacy*  Lab Work: None ordered If you have labs (blood work) drawn today and your tests are completely normal, you will receive your results only by: Marland Kitchen MyChart Message (if you have MyChart) OR . A paper copy in the mail If you have any lab test that is abnormal or we need to change your treatment, we will call you to review the results.  Testing/Procedures: None ordered  Follow-Up: At [facility], you and your health needs are our priority.  As part of our continuing mission to provide you with exceptional heart care, we have created designated Provider Care Teams.  These Care Teams include your primary Cardiologist (physician) and Advanced Practice Providers (APPs -  Physician Assistants and Nurse Practitioners) who all work together to provide you with the care you need, when you need it.  Your next appointment:   12 month(s)  The format for your next appointment:   In Person  Provider:   You may see Sanda Klein, MD or one of the following Advanced Practice Providers on your designated Care Team:    Almyra Deforest, PA-C  Fabian Sharp, PA-C or   Roby Lofts, [place]

## 2019-09-17 NOTE — Progress Notes (Signed)
Patient ID: Jeffrey Stephens, male   DOB: [birth year], 78 y.o.   MRN: [id number]    Cardiology Office Note    Date:  09/17/2019   ID:  Jeffrey Stephens Mar 10, 1941, MRN [id number]  PCP:  Lavone Orn, MD  Cardiologist:   Sanda Klein, MD   Chief Complaint  Patient presents with  . Atrial Flutter follow up    History of Present Illness:  Jeffrey Stephens is a 78 y.o. male with a history of recurrent persistent atrial flutter that responded well to sotalol therapy, but medication stopped due to bradycardia and near-syncope.  After stopping the sotalol he has become asymptomatic.  He has declined pacemaker implantation.  He has not had any clinically evident recurrence of flutter or atrial fibrillation since stopping sotalol.  Is compliant with Eliquis anticoagulation and has not had any embolic events or bleeding complications.  Eliquis is also indicated for remote history of DVT of the lower extremities.  She complains of shortness of breath with activity such as climbing 1 flight of stairs, but this does not interfere with her daily activities.  He does not think that his breathing has changed since his last appointment.  Otherwise denies any cardiovascular complaints.The patient specifically denies any chest pain at rest or with exertion, orthopnea, paroxysmal nocturnal dyspnea, syncope, palpitations, focal neurological deficits, intermittent claudication, lower extremity edema, unexplained weight gain, cough, hemoptysis or wheezing.  Roughly 2 years ago he underwent wedge resection of a right lower lobe stage I squamous cell carcinoma with curative intent. The chest CT showed a 4 cm ascending aortic aneurysm, but a follow up CT August 2018 describes the aorta as non-aneurysmal, 3.8 cm. He has a small fusiform AAA, 3.0 cm in 2012, 3.5 cm in May 2018, 3.9 cm in December 2019.  He initially presented with paroxysmal atrial flutter in August 2015 in the setting of recent left knee surgery.  There was evidence of normal left ventricular systolic function by both echo and nuclear scintigraphy, he did not have significant valvular disease in his left atrium was only mildly dilated. He has calcified coronaries on CT of the chest but has never had angina. A nuclear study was interpreted as showing possible inferior wall reversible defect (question combination of diaphragmatic attenuation artifact and excessive intestinal activity). He was hospitalized in November 2015 with atrial flutter with rapid ventricular response and converted on treatment with sotalol. As far as we know he has never had any embolic events. He has a remote history of deep venous thrombosis of the lower extremities, AAA, brain aneurysm complicated by secondary hemorrhagic stroke but treated with successful clipping in the past.  Past Medical History:  Diagnosis Date  . AAA (abdominal aortic aneurysm) ([place])    a. followed by Dr. Deitra Mayo - 01/2016 CT: 3.6 cm infrarenal AAA.  Marland Kitchen Abnormal nuclear cardiac imaging test 07/13/2014   a. 06/2014 MV: possible reversible inferior wall defect,no wma, nl EF, felt to be artifact.  . Basal cell carcinoma    "LLE; some on my head"  . BPH (benign prostatic hyperplasia)   . Cerebral aneurysm 1994  . Constipation   . Depression   . DVT (deep venous thrombosis) ([place]) 2001   Hx of Left leg   . Dysrhythmia    AFIB   . Frequency of urination   . GERD (gastroesophageal reflux disease)   . History of echocardiogram    a. 2D ECHO: 06/11/2014: EF 55-60%. Normal wall thickness. Indeterminate DD ( atrial  flutter ). No RWMA. Mild LA dilation. Normal RV size and systolic function. No significant valvular abnormalities.  . History of palpitations    OCCASIONAL  . Kidney stones   . Lung nodule    a. 02/2016 PET scan: intensely hypermetabolic 2.0 x 1.5 cm central RLL nodule (SUV 23).  . Paroxysmal atrial flutter ([place])    a. On eliquis and sotalol (CHA2DS2VASc = 1).  . Rheumatoid  arthritis ([place])    "qwhere"  . Stroke [facility]) 1994   "when I had ruptured aneurysm in my head"  . TIA (transient ischemic attack)    1994 WITH ANEYRYSM    Past Surgical History:  Procedure Laterality Date  . BASAL CELL CARCINOMA EXCISION     "LLE; top of my head"  . CEREBRAL ANEURYSM REPAIR  1994   Hx of ruptured brain aneurysm Tx by Dr. Ellene Route  . CYSTOSCOPY W/ STONE MANIPULATION  1980's X 1  . EXCISIONAL HEMORRHOIDECTOMY  1970's  . JOINT REPLACEMENT     LT KNEE  . TOTAL KNEE ARTHROPLASTY Left 05/14/2014   Procedure: LEFT TOTAL KNEE ARTHROPLASTY;  Surgeon: Johnn Hai, MD;  [facility]: [facility];  Service: Orthopedics;  Laterality: Left;  Marland Kitchen VIDEO ASSISTED THORACOSCOPY (VATS)/WEDGE RESECTION Right 05/08/2016   Procedure: VIDEO ASSISTED THORACOSCOPY (VATS)/LUNG RESECTION;  Surgeon: Ivin Poot, MD;  Location: [facility] OR;  Service: Thoracic;  Laterality: Right;    Current Medications: Outpatient Medications Prior to Visit  Medication Sig Dispense Refill  . acetaminophen (TYLENOL) 325 MG tablet Take 2 tablets (650 mg total) by mouth every 4 (four) hours as needed for headache or mild pain.    Marland Kitchen ELIQUIS 5 MG TABS tablet TAKE 1 TABLET BY MOUTH TWICE A DAY 170 tablet 1  . folic acid (FOLVITE) 1 MG tablet Take 1 mg by mouth every morning.     . lansoprazole (PREVACID) 30 MG capsule Take 30 mg by mouth every morning.     . methotrexate (RHEUMATREX) 2.5 MG tablet TAKE 5 TABLETS BY MOUTH ONCE A WEEK ON MONDAYS  1  . methotrexate (RHEUMATREX) 5 MG tablet Take 5 mg by mouth once a week. Caution: Chemotherapy. Protect from light.    . Multiple Vitamins-Minerals (CENTRUM SILVER ADULT 50+) TABS Take 50 mg by mouth daily.    Marland Kitchen oxyCODONE (OXY IR/ROXICODONE) 5 MG immediate release tablet Take 1-2 tablets (5-10 mg total) by mouth every 6 (six) hours as needed for severe pain. 30 tablet 0  . oxycodone-acetaminophen (PERCOCET) 2.5-325 MG tablet TAKE 1 TABLET BY MOUTH EVERY DAY AS NEEDED FOR 7 DAY  0  .  tamsulosin (FLOMAX) 0.4 MG CAPS capsule Take 0.4 mg by mouth daily.  11   No facility-administered medications prior to visit.     Allergies:   No known allergies   Social History   Socioeconomic History  . Marital status: Widowed    Spouse name: Not on file  . Number of children: Not on file  . Years of education: Not on file  . Highest education level: Not on file  Occupational History  . Not on file  Tobacco Use  . Smoking status: Former Smoker    Packs/day: 2.50    Years: 37.00    Pack years: 92.50    Types: Cigarettes    Quit date: 09/25/1992    Years since quitting: 26.9  . Smokeless tobacco: Never Used  Substance and Sexual Activity  . Alcohol use: No    Alcohol/week: 0.0 standard drinks  .  Drug use: No  . Sexual activity: Not Currently  Other Topics Concern  . Not on file  Social History Narrative  . Not on file   Social Determinants of Health   Financial Resource Strain:   . Difficulty of Paying Living Expenses: Not on file  Food Insecurity:   . Worried About Charity fundraiser in the Last Year: Not on file  . Ran Out of Food in the Last Year: Not on file  Transportation Needs:   . Lack of Transportation (Medical): Not on file  . Lack of Transportation (Non-Medical): Not on file  Physical Activity:   . Days of Exercise per Week: Not on file  . Minutes of Exercise per Session: Not on file  Stress:   . Feeling of Stress : Not on file  Social Connections:   . Frequency of Communication with Friends and Family: Not on file  . Frequency of Social Gatherings with Friends and Family: Not on file  . Attends Religious Services: Not on file  . Active Member of Clubs or Organizations: Not on file  . Attends Archivist Meetings: Not on file  . Marital Status: Not on file     Family History:  The patient's family history includes Cancer in his brother; Diabetes in his father; Heart attack in his father; Heart disease in his brother and father.   ROS:    Please see the history of present illness.    ROS All other systems are reviewed and are negative.  PHYSICAL EXAM:   VS:  BP 140/62   Pulse 84   Ht 5\' 9"  (1.753 m)   Wt 237 lb (107.5 kg)   BMI 35.00 kg/m      General: Alert, oriented x3, no distress, obese Head: no evidence of trauma, PERRL, EOMI, no exophtalmos or lid lag, no myxedema, no xanthelasma; normal ears, nose and oropharynx Neck: normal jugular venous pulsations and no hepatojugular reflux; brisk carotid pulses without delay and no carotid bruits Chest: clear to auscultation, no signs of consolidation by percussion or palpation, normal fremitus, symmetrical and full respiratory excursions Cardiovascular: normal position and quality of the apical impulse, regular rhythm, normal first and second heart sounds, no murmurs, rubs or gallops Abdomen: no tenderness or distention, no masses by palpation, no abnormal pulsatility or arterial bruits, normal bowel sounds, no hepatosplenomegaly Extremities: no clubbing, cyanosis or edema; 2+ radial, ulnar and brachial pulses bilaterally; 2+ right femoral, posterior tibial and dorsalis pedis pulses; 2+ left femoral, posterior tibial and dorsalis pedis pulses; no subclavian or femoral bruits Neurological: grossly nonfocal Psych: Normal mood and affect    Wt Readings from Last 3 Encounters:  09/17/19 237 lb (107.5 kg)  04/09/19 235 lb (106.6 kg)  09/04/18 235 lb (106.6 kg)      Studies/Labs Reviewed:   EKG:  EKG is ordered today. NSR, normal ECG Lipid Panel    Component Value Date/Time   CHOL 95 06/12/2014 0050   TRIG 80 06/12/2014 0050   HDL 32 (L) 06/12/2014 0050   CHOLHDL 3.0 06/12/2014 0050   VLDL 16 06/12/2014 0050   LDLCALC 47 06/12/2014 0050     ASSESSMENT:    1. Atrial flutter, paroxysmal ([place])   2. Long term current use of anticoagulant   3. Coronary artery calcification seen on CT scan   4. AAA (abdominal aortic aneurysm) without rupture ([place])   5. Aortic  atherosclerosis ([place])   6. History of lobectomy of lung  PLAN:  In order of problems listed above:  1. AFlutter: Asymptomatic, but could well be having silent arrhythmia events. 2. Eliquis: CHADS Vasc 3 (age 32, vascular disease).  No bleeding problems or falls.  Anticoagulation is also indicated due to her remote history of DVT. 3. Coronary Ca: Low risk nuclear study in 2015. Asymptomatic.  4. AAA: By last ultrasound in the summer 2019 measures 3.9 cm in maximum diameter, mildly increased compared to 3.0 in 2012.  Monitors with Dr. Scot Dock and is due for follow-up study. 5. Ao atherosclerosis: 4 cm ascending aneurysm reported on CT 05/2016 during f/u for lung neoplasm, but not confirmed and measured at 3.8 cm on follow up CT in 04/2017 and also not mentioned on the last CT in July 2019.  LDL cholesterol was 47 in 2015. 6. S/P lung Ca surgery: This probably explains his mild exertional dyspnea.  Still has a 40-year history of 1 pack/day smoking, but quit in 1994.  Pulmonary function test presurgery showed an FEV1 of 80% of predicted (2.2 L/second).  Symptoms have been stable.  Weight loss would be beneficial.  Medication Adjustments/Labs and Tests Ordered: Current medicines are reviewed at length with the patient today.  Concerns regarding medicines are outlined above.  Medication changes, Labs and Tests ordered today are listed in the Patient Instructions below. Patient Instructions  Medication Instructions:  No changes *If you need a refill on your cardiac medications before your next appointment, please call your pharmacy*  Lab Work: None ordered If you have labs (blood work) drawn today and your tests are completely normal, you will receive your results only by: Marland Kitchen MyChart Message (if you have MyChart) OR . A paper copy in the mail If you have any lab test that is abnormal or we need to change your treatment, we will call you to review the results.  Testing/Procedures: None  ordered  Follow-Up: At [facility], you and your health needs are our priority.  As part of our continuing mission to provide you with exceptional heart care, we have created designated Provider Care Teams.  These Care Teams include your primary Cardiologist (physician) and Advanced Practice Providers (APPs -  Physician Assistants and Nurse Practitioners) who all work together to provide you with the care you need, when you need it.  Your next appointment:   12 month(s)  The format for your next appointment:   In Person  Provider:   You may see Sanda Klein, MD or one of the following Advanced Practice Providers on your designated Care Team:    Almyra Deforest, PA-C  Fabian Sharp, [place] or   Roby Lofts, PA-C     Signed, Sanda Klein, MD  09/17/2019 8:40 AM    [facility] [place] [address], [place], [place]  [address] Phone: [phone]; Fax: [phone]

## 2019-09-24 ENCOUNTER — Ambulatory Visit: Payer: Medicare Other | Attending: Internal Medicine

## 2019-09-24 ENCOUNTER — Other Ambulatory Visit: Payer: Self-pay

## 2019-09-24 DIAGNOSIS — Z20822 Contact with and (suspected) exposure to covid-19: Secondary | ICD-10-CM

## 2019-09-25 LAB — NOVEL CORONAVIRUS, NAA: SARS-CoV-2, NAA: NOT DETECTED

## 2019-09-29 ENCOUNTER — Telehealth: Payer: Self-pay

## 2019-09-29 NOTE — Telephone Encounter (Signed)
Pt notified of negative COVID-19 results. Understanding verbalized.  Jeffrey Stephens   

## 2020-01-05 DIAGNOSIS — M150 Primary generalized (osteo)arthritis: Secondary | ICD-10-CM | POA: Diagnosis not present

## 2020-01-05 DIAGNOSIS — M2550 Pain in unspecified joint: Secondary | ICD-10-CM | POA: Diagnosis not present

## 2020-01-05 DIAGNOSIS — E669 Obesity, unspecified: Secondary | ICD-10-CM | POA: Diagnosis not present

## 2020-01-05 DIAGNOSIS — Z6834 Body mass index (BMI) 34.0-34.9, adult: Secondary | ICD-10-CM | POA: Diagnosis not present

## 2020-01-05 DIAGNOSIS — Z79899 Other long term (current) drug therapy: Secondary | ICD-10-CM | POA: Diagnosis not present

## 2020-01-05 DIAGNOSIS — M0579 Rheumatoid arthritis with rheumatoid factor of multiple sites without organ or systems involvement: Secondary | ICD-10-CM | POA: Diagnosis not present

## 2020-01-19 ENCOUNTER — Other Ambulatory Visit: Payer: Self-pay | Admitting: Cardiovascular Disease

## 2020-01-19 NOTE — Telephone Encounter (Signed)
78 M 107.5 kg, SCr 1.33 (1/21), LOV Croitoru 08/2019

## 2020-01-30 DIAGNOSIS — N5201 Erectile dysfunction due to arterial insufficiency: Secondary | ICD-10-CM | POA: Diagnosis not present

## 2020-01-30 DIAGNOSIS — R948 Abnormal results of function studies of other organs and systems: Secondary | ICD-10-CM | POA: Diagnosis not present

## 2020-02-05 DIAGNOSIS — E349 Endocrine disorder, unspecified: Secondary | ICD-10-CM | POA: Diagnosis not present

## 2020-02-05 DIAGNOSIS — R3912 Poor urinary stream: Secondary | ICD-10-CM | POA: Diagnosis not present

## 2020-02-05 DIAGNOSIS — N401 Enlarged prostate with lower urinary tract symptoms: Secondary | ICD-10-CM | POA: Diagnosis not present

## 2020-02-05 DIAGNOSIS — N5201 Erectile dysfunction due to arterial insufficiency: Secondary | ICD-10-CM | POA: Diagnosis not present

## 2020-02-10 DIAGNOSIS — H2513 Age-related nuclear cataract, bilateral: Secondary | ICD-10-CM | POA: Diagnosis not present

## 2020-02-11 ENCOUNTER — Other Ambulatory Visit: Payer: Self-pay | Admitting: Cardiothoracic Surgery

## 2020-02-11 DIAGNOSIS — R911 Solitary pulmonary nodule: Secondary | ICD-10-CM

## 2020-02-16 ENCOUNTER — Other Ambulatory Visit: Payer: Medicare PPO

## 2020-02-17 ENCOUNTER — Ambulatory Visit: Payer: Medicare PPO | Admitting: Thoracic Surgery (Cardiothoracic Vascular Surgery)

## 2020-02-18 ENCOUNTER — Other Ambulatory Visit: Payer: Self-pay | Admitting: Cardiothoracic Surgery

## 2020-02-18 DIAGNOSIS — R911 Solitary pulmonary nodule: Secondary | ICD-10-CM

## 2020-02-19 DIAGNOSIS — E349 Endocrine disorder, unspecified: Secondary | ICD-10-CM | POA: Diagnosis not present

## 2020-02-25 ENCOUNTER — Ambulatory Visit: Payer: Medicare PPO | Admitting: Cardiothoracic Surgery

## 2020-03-03 ENCOUNTER — Encounter: Payer: Self-pay | Admitting: Cardiothoracic Surgery

## 2020-03-03 ENCOUNTER — Ambulatory Visit: Payer: Medicare PPO | Admitting: Cardiothoracic Surgery

## 2020-03-03 ENCOUNTER — Other Ambulatory Visit: Payer: Self-pay

## 2020-03-03 ENCOUNTER — Other Ambulatory Visit: Payer: Medicare PPO

## 2020-03-03 ENCOUNTER — Ambulatory Visit
Admission: RE | Admit: 2020-03-03 | Discharge: 2020-03-03 | Disposition: A | Discharge status: Home / Self Care | Payer: Medicare PPO | Source: Ambulatory Visit | Attending: Cardiothoracic Surgery | Admitting: Cardiothoracic Surgery

## 2020-03-03 VITALS — BP 123/73 | HR 85 | Temp 97.6°F | Resp 20 | Ht 69.0 in | Wt 235.0 lb

## 2020-03-03 DIAGNOSIS — J432 Centrilobular emphysema: Secondary | ICD-10-CM | POA: Diagnosis not present

## 2020-03-03 DIAGNOSIS — R918 Other nonspecific abnormal finding of lung field: Secondary | ICD-10-CM | POA: Diagnosis not present

## 2020-03-03 DIAGNOSIS — Z85118 Personal history of other malignant neoplasm of bronchus and lung: Secondary | ICD-10-CM | POA: Diagnosis not present

## 2020-03-03 DIAGNOSIS — R911 Solitary pulmonary nodule: Secondary | ICD-10-CM | POA: Diagnosis not present

## 2020-03-03 DIAGNOSIS — J439 Emphysema, unspecified: Secondary | ICD-10-CM | POA: Insufficient documentation

## 2020-03-03 NOTE — Progress Notes (Signed)
PCP is Lavone Orn, MD Referring Provider is Lavone Orn, MD  Chief Complaint  Patient presents with  . Lung Lesion    1 year f/u with Chest CT, HX of lung cancer    HPI: Patient returns for annual follow-up with surveillance CT scan of chest 4 years after resection of a poorly differentiated adenocarcinoma stage one of the right lower lobe, August 2017. Jeffrey Stephens CT scan images show significant changes of emphysema.  There is no evidence of recurrent malignancy or at risk adenopathy or new pulmonary nodules.   Patient complains of dyspnea on exertion and decreased exercise tolerance.  Prior to VATS his diffusion capacity was only 40%. He denies chest pain, ankle edema, orthopnea or PND.  He is in sinus rhythm but has episodes of atrial flutter and is on chronic Eliquis-also history of previous DVT.  The patient's oxygen saturation at rest is 94%.  After walking 100 feet on room air in the office his saturation is 93% and his respiratory rate is higher.  Past Medical History:  Diagnosis Date  . AAA (abdominal aortic aneurysm) ([place])    a. followed by Dr. Deitra Mayo - 01/2016 CT: 3.6 cm infrarenal AAA.  Marland Kitchen Abnormal nuclear cardiac imaging test 07/13/2014   a. 06/2014 MV: possible reversible inferior wall defect,no wma, nl EF, felt to be artifact.  . Basal cell carcinoma    "LLE; some on my head"  . BPH (benign prostatic hyperplasia)   . Cerebral aneurysm 1994  . Constipation   . Depression   . DVT (deep venous thrombosis) ([place]) 2001   Hx of Left leg   . Dysrhythmia    AFIB   . Frequency of urination   . GERD (gastroesophageal reflux disease)   . History of echocardiogram    a. 2D ECHO: 06/11/2014: EF 55-60%. Normal wall thickness. Indeterminate DD ( atrial flutter ). No RWMA. Mild LA dilation. Normal RV size and systolic function. No significant valvular abnormalities.  . History of palpitations    OCCASIONAL  . Kidney stones   . Lung nodule    a. 02/2016 PET scan:  intensely hypermetabolic 2.0 x 1.5 cm central RLL nodule (SUV 23).  . Paroxysmal atrial flutter ([place])    a. On eliquis and sotalol (CHA2DS2VASc = 1).  . Rheumatoid arthritis ([place])    "qwhere"  . Stroke [facility]) 1994   "when I had ruptured aneurysm in my head"  . TIA (transient ischemic attack)    1994 WITH ANEYRYSM    Past Surgical History:  Procedure Laterality Date  . BASAL CELL CARCINOMA EXCISION     "LLE; top of my head"  . CEREBRAL ANEURYSM REPAIR  1994   Hx of ruptured brain aneurysm Tx by Dr. Ellene Route  . CYSTOSCOPY W/ STONE MANIPULATION  1980's X 1  . EXCISIONAL HEMORRHOIDECTOMY  1970's  . JOINT REPLACEMENT     LT KNEE  . TOTAL KNEE ARTHROPLASTY Left 05/14/2014   Procedure: LEFT TOTAL KNEE ARTHROPLASTY;  Surgeon: Johnn Hai, MD;  Location: [facility] ORS;  Service: Orthopedics;  Laterality: Left;  Marland Kitchen VIDEO ASSISTED THORACOSCOPY (VATS)/WEDGE RESECTION Right 05/08/2016   Procedure: VIDEO ASSISTED THORACOSCOPY (VATS)/LUNG RESECTION;  Surgeon: Ivin Poot, MD;  Location: [facility] OR;  Service: Thoracic;  Laterality: Right;    Family History  Problem Relation Age of Onset  . Heart disease Father        Aneurysm  . Diabetes Father   . Heart attack Father   . Heart disease  Brother        Heart Disease before age 30  . Cancer Brother   . Hypertension Neg Hx   . Stroke Neg Hx     Social History Social History   Tobacco Use  . Smoking status: Former Smoker    Packs/day: 2.50    Years: 37.00    Pack years: 92.50    Types: Cigarettes    Quit date: 09/25/1992    Years since quitting: 27.4  . Smokeless tobacco: Never Used  Substance Use Topics  . Alcohol use: No    Alcohol/week: 0.0 standard drinks  . Drug use: No    Current Outpatient Medications  Medication Sig Dispense Refill  . acetaminophen (TYLENOL) 325 MG tablet Take 2 tablets (650 mg total) by mouth every 4 (four) hours as needed for headache or mild pain.    Marland Kitchen ELIQUIS 5 MG TABS tablet TAKE 1 TABLET BY MOUTH TWICE A DAY  993 tablet 1  . folic acid (FOLVITE) 1 MG tablet Take 1 mg by mouth every morning.     . lansoprazole (PREVACID) 30 MG capsule Take 30 mg by mouth every morning.     . methotrexate (RHEUMATREX) 2.5 MG tablet TAKE 5 TABLETS BY MOUTH ONCE A WEEK ON MONDAYS  1  . methotrexate (RHEUMATREX) 5 MG tablet Take 5 mg by mouth once a week. Caution: Chemotherapy. Protect from light.    . Multiple Vitamins-Minerals (CENTRUM SILVER ADULT 50+) TABS Take 50 mg by mouth daily.    Marland Kitchen oxyCODONE (OXY IR/ROXICODONE) 5 MG immediate release tablet Take 1-2 tablets (5-10 mg total) by mouth every 6 (six) hours as needed for severe pain. 30 tablet 0  . oxycodone-acetaminophen (PERCOCET) 2.5-325 MG tablet TAKE 1 TABLET BY MOUTH EVERY DAY AS NEEDED FOR 7 DAY  0  . tamsulosin (FLOMAX) 0.4 MG CAPS capsule Take 0.4 mg by mouth daily.  11   No current facility-administered medications for this visit.    Allergies  Allergen Reactions  . No Known Allergies Other (See Comments)    Review of Systems   Patient had his COVID-19 vaccine Patient denies any recent hospitalizations She still has some neuropathic pain in his incision when he torqued his thorax or raises his right arm-he was reassured that this is not worrisome Patient denies chest pain on exertion, dizziness or presyncope. Weight has been stable.  BP 123/73   Pulse 85   Temp 97.6 F (36.4 C) (Skin)   Resp 20   Ht 5\' 9"  (1.753 m)   Wt 235 lb (106.6 kg)   SpO2 90% Comment: RA  BMI 34.70 kg/m  Physical Exam      Exam    General- alert and comfortable    Neck- no JVD, no cervical adenopathy palpable, no carotid bruit   Lungs-bilateral dry rales, no wheezes.  Right VATS incision well-healed.   Cor- regular rate and rhythm, no murmur , gallop   Abdomen- soft, non-tender   Extremities - warm, non-tender, minimal edema   Neuro- oriented, appropriate, no focal weakness   Diagnostic Tests: CT scan images personally reviewed with results as noted above.   No evidence of recurrent adenocarcinoma 4 years postop  Impression: #1 status post right VATS for sublobar resection of adenocarcinoma #2 significant  symptomatic COPD, could have sleep apnea #3 chronic Eliquis for history of DVT and atrial flutter   Plan: Patient return for his 5-year final surveillance scan next summer.  I will refer the patient to pulmonology to  help improve his symptomatic emphysema.  Len Childs, MD [facility] [phone]

## 2020-03-09 ENCOUNTER — Other Ambulatory Visit: Payer: Self-pay | Admitting: Cardiothoracic Surgery

## 2020-03-09 DIAGNOSIS — R911 Solitary pulmonary nodule: Secondary | ICD-10-CM

## 2020-03-10 ENCOUNTER — Encounter: Payer: Self-pay | Admitting: Vascular Surgery

## 2020-03-10 ENCOUNTER — Ambulatory Visit (HOSPITAL_COMMUNITY)
Admission: RE | Admit: 2020-03-10 | Discharge: 2020-03-10 | Disposition: A | Discharge status: Home / Self Care | Payer: Medicare PPO | Source: Ambulatory Visit | Attending: Vascular Surgery | Admitting: Vascular Surgery

## 2020-03-10 ENCOUNTER — Ambulatory Visit (INDEPENDENT_AMBULATORY_CARE_PROVIDER_SITE_OTHER): Payer: Medicare PPO | Admitting: Vascular Surgery

## 2020-03-10 ENCOUNTER — Other Ambulatory Visit: Payer: Self-pay | Admitting: *Deleted

## 2020-03-10 ENCOUNTER — Other Ambulatory Visit: Payer: Self-pay

## 2020-03-10 VITALS — BP 129/82 | HR 76 | Temp 97.8°F | Resp 20 | Ht 69.0 in | Wt 233.6 lb

## 2020-03-10 DIAGNOSIS — I714 Abdominal aortic aneurysm, without rupture, unspecified: Secondary | ICD-10-CM

## 2020-03-10 NOTE — Progress Notes (Signed)
REASON FOR VISIT:   Follow-up of abdominal aortic aneurysm  MEDICAL ISSUES:   ABDOMINAL AORTIC ANEURYSM: This patient's aneurysm has enlarged from 3.9 cm to 4.4 cm in a year and a half.  I explained that we would normally consider elective repair at 5.5 cm in a normal risk patient.  I explained that the reason we do not fix all aneurysms is because there is significant risk with surgery.  He is not a smoker.  His blood pressures been under good control.  Given the enlargement of the aneurysm I have ordered a follow-up ultrasound in 6 months and I will see him back at that time.  If the aneurysm approaches 5.5 cm then we would consider a CT angiogram to assess our options for repair if he continues to enlarge.  I will see him back in 6 months with a follow-up duplex scan.  He knows to call sooner if he has problems.  Also explained that if he develops sudden abdominal pain or back pain he would need to get to the emergency department as soon as possible.  I explained that multiple things can cause abdominal pain and back pain but a CT scan would quickly sort out whether or not his symptoms are related to his aneurysm.  Clearly at this point I think the risk of surgery outweighs the risk of rupture.    HPI:   Jeffrey Stephens is a pleasant 79 y.o. male who I have been following with an abdominal aortic aneurysm.  I last saw him on 09/04/2018 when the aneurysm measured 3.9 cm in maximum diameter.  He comes in for an 66-month follow-up visit.  Since I saw him last, he denies any history of abdominal pain or back pain.  There have been no significant changes in his medical history.  He is on Eliquis for atrial flutter.  His blood pressures been under good control.  He is not a smoker.  Past Medical History:  Diagnosis Date  . AAA (abdominal aortic aneurysm) ([place])    a. followed by Dr. Deitra Mayo - 01/2016 CT: 3.6 cm infrarenal AAA.  Marland Kitchen Abnormal nuclear cardiac imaging test 07/13/2014   a.  06/2014 MV: possible reversible inferior wall defect,no wma, nl EF, felt to be artifact.  . Basal cell carcinoma    "LLE; some on my head"  . BPH (benign prostatic hyperplasia)   . Cerebral aneurysm 1994  . Constipation   . Depression   . DVT (deep venous thrombosis) ([place]) 2001   Hx of Left leg   . Dysrhythmia    AFIB   . Frequency of urination   . GERD (gastroesophageal reflux disease)   . History of echocardiogram    a. 2D ECHO: 06/11/2014: EF 55-60%. Normal wall thickness. Indeterminate DD ( atrial flutter ). No RWMA. Mild LA dilation. Normal RV size and systolic function. No significant valvular abnormalities.  . History of palpitations    OCCASIONAL  . Kidney stones   . Lung nodule    a. 02/2016 PET scan: intensely hypermetabolic 2.0 x 1.5 cm central RLL nodule (SUV 23).  . Paroxysmal atrial flutter ([place])    a. On eliquis and sotalol (CHA2DS2VASc = 1).  . Rheumatoid arthritis ([place])    "qwhere"  . Stroke [facility]) 1994   "when I had ruptured aneurysm in my head"  . TIA (transient ischemic attack)    1994 WITH ANEYRYSM    Family History  Problem Relation Age of Onset  .  Heart disease Father        Aneurysm  . Diabetes Father   . Heart attack Father   . Heart disease Brother        Heart Disease before age 19  . Cancer Brother   . Hypertension Neg Hx   . Stroke Neg Hx     SOCIAL HISTORY: Social History   Tobacco Use  . Smoking status: Former Smoker    Packs/day: 2.50    Years: 37.00    Pack years: 92.50    Types: Cigarettes    Quit date: 09/25/1992    Years since quitting: 27.4  . Smokeless tobacco: Never Used  Substance Use Topics  . Alcohol use: No    Alcohol/week: 0.0 standard drinks    Allergies  Allergen Reactions  . No Known Allergies Other (See Comments)    Current Outpatient Medications  Medication Sig Dispense Refill  . acetaminophen (TYLENOL) 325 MG tablet Take 2 tablets (650 mg total) by mouth every 4 (four) hours as needed for headache or mild  pain.    Marland Kitchen ELIQUIS 5 MG TABS tablet TAKE 1 TABLET BY MOUTH TWICE A DAY 914 tablet 1  . folic acid (FOLVITE) 1 MG tablet Take 1 mg by mouth every morning.     . lansoprazole (PREVACID) 30 MG capsule Take 30 mg by mouth every morning.     . methotrexate (RHEUMATREX) 2.5 MG tablet TAKE 5 TABLETS BY MOUTH ONCE A WEEK ON MONDAYS  1  . methotrexate (RHEUMATREX) 5 MG tablet Take 5 mg by mouth once a week. Caution: Chemotherapy. Protect from light.    . Multiple Vitamins-Minerals (CENTRUM SILVER ADULT 50+) TABS Take 50 mg by mouth daily.    Marland Kitchen oxyCODONE (OXY IR/ROXICODONE) 5 MG immediate release tablet Take 1-2 tablets (5-10 mg total) by mouth every 6 (six) hours as needed for severe pain. 30 tablet 0  . oxycodone-acetaminophen (PERCOCET) 2.5-325 MG tablet TAKE 1 TABLET BY MOUTH EVERY DAY AS NEEDED FOR 7 DAY  0  . tamsulosin (FLOMAX) 0.4 MG CAPS capsule Take 0.4 mg by mouth daily.  11   No current facility-administered medications for this visit.    REVIEW OF SYSTEMS:  [X]  denotes positive finding, [ ]  denotes negative finding Cardiac  Comments:  Chest pain or chest pressure:    Shortness of breath upon exertion:    Short of breath when lying flat:    Irregular heart rhythm:        Vascular    Pain in calf, thigh, or hip brought on by ambulation:    Pain in feet at night that wakes you up from your sleep:     Blood clot in your veins:    Leg swelling:         Pulmonary    Oxygen at home:    Productive cough:     Wheezing:         Neurologic    Sudden weakness in arms or legs:     Sudden numbness in arms or legs:     Sudden onset of difficulty speaking or slurred speech:    Temporary loss of vision in one eye:     Problems with dizziness:         Gastrointestinal    Blood in stool:     Vomited blood:         Genitourinary    Burning when urinating:     Blood in urine:  Psychiatric    Major depression:         Hematologic    Bleeding problems:    Problems with blood  clotting too easily:        Skin    Rashes or ulcers:        Constitutional    Fever or chills:     PHYSICAL EXAM:   Vitals:   03/10/20 0928  BP: 129/82  Pulse: 76  Resp: 20  Temp: 97.8 F (36.6 C)  TempSrc: Temporal  SpO2: 93%  Weight: 233 lb 9.6 oz (106 kg)  Height: 5\' 9"  (1.753 m)   Body mass index is 34.5 kg/m.  GENERAL: The patient is a well-nourished male, in no acute distress. The vital signs are documented above. CARDIAC: There is a regular rate and rhythm.  VASCULAR: I do not detect carotid bruits. He has palpable femoral and dorsalis pedis pulses bilaterally. PULMONARY: There is good air exchange bilaterally without wheezing or rales. ABDOMEN: Soft and non-tender with normal pitched bowel sounds.  Because of his size I cannot palpate his aneurysm. MUSCULOSKELETAL: There are no major deformities or cyanosis. NEUROLOGIC: No focal weakness or paresthesias are detected. SKIN: There are no ulcers or rashes noted. PSYCHIATRIC: The patient has a normal affect.  DATA:    DUPLEX ABDOMINAL AORTA: I have independently interpreted his duplex of the abdominal aorta.  The maximum diameter of his aneurysm is 4.45 cm which is increased from 3.9 cm 18 months ago.  Thus this has enlarged about a half a centimeter in the last 18 much which is certainly not very alarming.   Deitra Mayo [facility] [phone]

## 2020-03-10 NOTE — Progress Notes (Signed)
Thanks for the follow-up [facility]

## 2020-03-11 ENCOUNTER — Other Ambulatory Visit: Payer: Self-pay | Admitting: *Deleted

## 2020-03-11 DIAGNOSIS — I714 Abdominal aortic aneurysm, without rupture, unspecified: Secondary | ICD-10-CM

## 2020-04-05 DIAGNOSIS — M0579 Rheumatoid arthritis with rheumatoid factor of multiple sites without organ or systems involvement: Secondary | ICD-10-CM | POA: Diagnosis not present

## 2020-04-08 ENCOUNTER — Institutional Professional Consult (permissible substitution): Payer: Medicare PPO | Admitting: Emergency Medicine

## 2020-04-14 ENCOUNTER — Ambulatory Visit: Payer: Medicare PPO | Admitting: Cardiothoracic Surgery

## 2020-04-14 ENCOUNTER — Inpatient Hospital Stay: Admission: RE | Admit: 2020-04-14 | Payer: Medicare PPO | Source: Ambulatory Visit

## 2020-05-12 DIAGNOSIS — E349 Endocrine disorder, unspecified: Secondary | ICD-10-CM | POA: Diagnosis not present

## 2020-05-12 DIAGNOSIS — N5201 Erectile dysfunction due to arterial insufficiency: Secondary | ICD-10-CM | POA: Diagnosis not present

## 2020-05-20 ENCOUNTER — Encounter: Payer: Self-pay | Admitting: Emergency Medicine

## 2020-05-20 ENCOUNTER — Other Ambulatory Visit: Payer: Self-pay

## 2020-05-20 ENCOUNTER — Ambulatory Visit: Payer: Medicare PPO | Admitting: Emergency Medicine

## 2020-05-20 VITALS — BP 124/64 | HR 75 | Temp 97.1°F | Ht 69.0 in | Wt 234.8 lb

## 2020-05-20 DIAGNOSIS — C3430 Malignant neoplasm of lower lobe, unspecified bronchus or lung: Secondary | ICD-10-CM | POA: Diagnosis not present

## 2020-05-20 DIAGNOSIS — R0602 Shortness of breath: Secondary | ICD-10-CM

## 2020-05-20 DIAGNOSIS — J432 Centrilobular emphysema: Secondary | ICD-10-CM

## 2020-05-20 DIAGNOSIS — R918 Other nonspecific abnormal finding of lung field: Secondary | ICD-10-CM | POA: Diagnosis not present

## 2020-05-20 DIAGNOSIS — H9201 Otalgia, right ear: Secondary | ICD-10-CM | POA: Diagnosis not present

## 2020-05-20 DIAGNOSIS — H60501 Unspecified acute noninfective otitis externa, right ear: Secondary | ICD-10-CM | POA: Diagnosis not present

## 2020-05-20 DIAGNOSIS — H6121 Impacted cerumen, right ear: Secondary | ICD-10-CM | POA: Diagnosis not present

## 2020-05-20 MED ORDER — GUAIFENESIN ER 600 MG PO TB12: 600.0000 mg | ORAL_TABLET | Freq: Every day | ORAL | 2 refills | Status: DC

## 2020-05-20 MED ORDER — STIOLTO RESPIMAT 2.5-2.5 MCG/ACT IN AERS: 2.0000 | INHALATION_SPRAY | Freq: Every day | RESPIRATORY_TRACT | 0 refills | Status: DC

## 2020-05-20 NOTE — Patient Instructions (Signed)
Walking oximetry today on room air. We will try starting Stiolto 2 puffs once a day.  Keep track of whether this medication helps your breathing. We will repeat your pulmonary function testing at your next office visit to compare with your priors. Try starting guaifenesin 600 mg once a day and drink plenty of water.  Hopefully this will help you clear your mucus more easily. Follow with Dr. Lamonte Sakai next available with full pulmonary function testing on the same day.

## 2020-05-20 NOTE — Assessment & Plan Note (Signed)
Scattered stable pulmonary nodules, unchanged on serial CT scans.

## 2020-05-20 NOTE — Assessment & Plan Note (Signed)
Post wedge resection of right lower lobe poorly differentiated adenocarcinoma.  Follow serial imaging with Dr. Darcey Nora.

## 2020-05-20 NOTE — Progress Notes (Signed)
Subjective:    Patient ID: Jeffrey Stephens, male    DOB: 1941-08-04, 79 y.o.   MRN: [id number]  HPI 79 year old former smoker (90 pack years) previously followed by Dr. Gwenette Greet for COPD, hx DVT, A fib, RA on MTX, prior cerebral aneurysmal rupture, underwent right lower lobe resection for stage I poorly differentiated adenocarcinoma on 04/2016.  He reports that he is having progressive SOB over the last year. He has to stop to rest after 100 ft. He stopped his dedicated walking routine over a year ago. Not associated with CP or tightness. No trouble when sitting still or supine. He hears some occasional wheeze, with exertion. He has a lot of congestion, has to cough frequently - often clear, sometimes light green. Has to clear mucous first thing in the am. He was tried on some BD over a year ago  Pulmonary function testing 04/06/2016 reviewed by me, show mild obstruction without a bronchodilator response, FEV1 2.21 L (80% predicted), normal lung volumes (prior to his resection), significantly decreased diffusion capacity that does not correct when adjusted for his alveolar volume.  Most recent CT chest with 03/03/2020, reviewed by me, shows severe centrilobular paraseptal emphysema, right lower lobe surgical changes, scattered stable pulmonary nodules including a calcified nodule in the right lower lobe.   Review of Systems As per HPI  Past Medical History:  Diagnosis Date   AAA (abdominal aortic aneurysm) ([place])    a. followed by Dr. Deitra Mayo - 01/2016 CT: 3.6 cm infrarenal AAA.   Abnormal nuclear cardiac imaging test 07/13/2014   a. 06/2014 MV: possible reversible inferior wall defect,no wma, nl EF, felt to be artifact.   Basal cell carcinoma    "LLE; some on my head"   BPH (benign prostatic hyperplasia)    Cerebral aneurysm 1994   Constipation    Depression    DVT (deep venous thrombosis) ([place]) 2001   Hx of Left leg    Dysrhythmia    AFIB    Frequency of urination     GERD (gastroesophageal reflux disease)    History of echocardiogram    a. 2D ECHO: 06/11/2014: EF 55-60%. Normal wall thickness. Indeterminate DD ( atrial flutter ). No RWMA. Mild LA dilation. Normal RV size and systolic function. No significant valvular abnormalities.   History of palpitations    OCCASIONAL   Kidney stones    Lung nodule    a. 02/2016 PET scan: intensely hypermetabolic 2.0 x 1.5 cm central RLL nodule (SUV 23).   Paroxysmal atrial flutter ([place])    a. On eliquis and sotalol (CHA2DS2VASc = 1).   Rheumatoid arthritis ([place])    "qwhere"   Stroke [facility]) 1994   "when I had ruptured aneurysm in my head"   TIA (transient ischemic attack)    1994 WITH ANEYRYSM     Family History  Problem Relation Age of Onset   Heart disease Father        Aneurysm   Diabetes Father    Heart attack Father    Heart disease Brother        Heart Disease before age 68   Cancer Brother    Hypertension Neg Hx    Stroke Neg Hx      Social History   Socioeconomic History   Marital status: Married    Spouse name: Not on file   Number of children: Not on file   Years of education: Not on file   Highest education level: Not on file  Occupational History   Not on file  Tobacco Use   Smoking status: Former Smoker    Packs/day: 2.50    Years: 37.00    Pack years: 92.50    Types: Cigarettes    Quit date: 09/25/1992    Years since quitting: 27.6   Smokeless tobacco: Never Used  Substance and Sexual Activity   Alcohol use: No    Alcohol/week: 0.0 standard drinks   Drug use: No   Sexual activity: Not Currently  Other Topics Concern   Not on file  Social History Narrative   Not on file   Social Determinants of Health   Financial Resource Strain:    Difficulty of Paying Living Expenses: Not on file  Food Insecurity:    Worried About Charity fundraiser in the Last Year: Not on file   [employer] of Food in the Last Year: Not on file  Transportation Needs:     Lack of Transportation (Medical): Not on file   Lack of Transportation (Non-Medical): Not on file  Physical Activity:    Days of Exercise per Week: Not on file   Minutes of Exercise per Session: Not on file  Stress:    Feeling of Stress : Not on file  Social Connections:    Frequency of Communication with Friends and Family: Not on file   Frequency of Social Gatherings with Friends and Family: Not on file   Attends Religious Services: Not on file   Active Member of Clubs or Organizations: Not on file   Attends Archivist Meetings: Not on file   Marital Status: Not on file  Intimate Partner Violence:    Fear of Current or Ex-Partner: Not on file   Emotionally Abused: Not on file   Physically Abused: Not on file   Sexually Abused: Not on file    Worked in the prison system, 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 No overseas action.  From [place]  Allergies  Allergen Reactions   No Known Allergies Other (See Comments)     Outpatient Medications Prior to Visit  Medication Sig Dispense Refill   acetaminophen (TYLENOL) 325 MG tablet Take 2 tablets (650 mg total) by mouth every 4 (four) hours as needed for headache or mild pain.     ELIQUIS 5 MG TABS tablet TAKE 1 TABLET BY MOUTH TWICE A DAY 676 tablet 1   folic acid (FOLVITE) 1 MG tablet Take 1 mg by mouth every morning.      methotrexate (RHEUMATREX) 2.5 MG tablet TAKE 5 TABLETS BY MOUTH ONCE A WEEK ON MONDAYS  1   methotrexate (RHEUMATREX) 5 MG tablet Take 5 mg by mouth once a week. Caution: Chemotherapy. Protect from light.     Multiple Vitamins-Minerals (CENTRUM SILVER ADULT 50+) TABS Take 50 mg by mouth daily.     oxyCODONE (OXY IR/ROXICODONE) 5 MG immediate release tablet Take 1-2 tablets (5-10 mg total) by mouth every 6 (six) hours as needed for severe pain. 30 tablet 0   lansoprazole (PREVACID) 30 MG capsule Take 30 mg by mouth every morning.  (Patient not taking:  Reported on 05/20/2020)     oxycodone-acetaminophen (PERCOCET) 2.5-325 MG tablet TAKE 1 TABLET BY MOUTH EVERY DAY AS NEEDED FOR 7 DAY (Patient not taking: Reported on 05/20/2020)  0   tamsulosin (FLOMAX) 0.4 MG CAPS capsule Take 0.4 mg by mouth daily. (Patient not taking: Reported on 05/20/2020)  11   No facility-administered medications prior to  visit.         Objective:   Physical Exam Vitals:   05/20/20 0924  BP: 124/64  Pulse: 75  Temp: (!) 97.1 F (36.2 C)  TempSrc: Temporal  SpO2: 98%  Weight: 234 lb 12.8 oz (106.5 kg)  Height: 5\' 9"  (1.753 m)   Gen: Pleasant, obese, in no distress,  normal affect  ENT: No lesions,  mouth clear,  oropharynx clear, no postnasal drip  Neck: No JVD, no stridor  Lungs: No use of accessory muscles, distant, no crackles or wheezing on normal respiration, no wheeze on forced expiration  Cardiovascular: RRR, heart sounds normal, no murmur or gallops, no peripheral edema  Musculoskeletal: No deformities, no cyanosis or clubbing  Neuro: alert, awake, non focal  Skin: Warm, no lesions or rash     Assessment & Plan:  COPD with emphysema ([place]) Significant emphysematous change on his imaging and obstruction on his original PFTs from 2017.  I do suspect that his dyspnea is principally related to COPD.  He has some cough and mucus burden, difficulty clearing thick secretions.  He may actually desaturate on ambulation.  We will check for this today.  I will start him on schedule bronchodilator therapy to see if he gets benefit.  Recheck his pulmonary function test to compare with 2017.  Walking oximetry today on room air. We will try starting Stiolto 2 puffs once a day.  Keep track of whether this medication helps your breathing. We will repeat your pulmonary function testing at your next office visit to compare with your priors. Try starting guaifenesin 600 mg once a day and drink plenty of water.  Hopefully this will help you clear your mucus  more easily. Follow with Dr. Lamonte Sakai next available with full pulmonary function testing on the same day.  Pulmonary nodules Scattered stable pulmonary nodules, unchanged on serial CT scans.  Lung cancer, lower lobe ([place]) Post wedge resection of right lower lobe poorly differentiated adenocarcinoma.  Follow serial imaging with Dr. Darcey Nora.  Jeffrey Apo, MD, PhD 05/20/2020, 10:00 AM [facility] [phone] or if no answer [phone]

## 2020-05-20 NOTE — Addendum Note (Signed)
Addended by: Gavin Potters R on: 05/20/2020 11:24 AM   Modules accepted: Orders

## 2020-05-20 NOTE — Assessment & Plan Note (Signed)
Significant emphysematous change on his imaging and obstruction on his original PFTs from 2017.  I do suspect that his dyspnea is principally related to COPD.  He has some cough and mucus burden, difficulty clearing thick secretions.  He may actually desaturate on ambulation.  We will check for this today.  I will start him on schedule bronchodilator therapy to see if he gets benefit.  Recheck his pulmonary function test to compare with 2017.  Walking oximetry today on room air. We will try starting Stiolto 2 puffs once a day.  Keep track of whether this medication helps your breathing. We will repeat your pulmonary function testing at your next office visit to compare with your priors. Try starting guaifenesin 600 mg once a day and drink plenty of water.  Hopefully this will help you clear your mucus more easily. Follow with Dr. Lamonte Sakai next available with full pulmonary function testing on the same day.

## 2020-05-21 DIAGNOSIS — J449 Chronic obstructive pulmonary disease, unspecified: Secondary | ICD-10-CM | POA: Diagnosis not present

## 2020-05-26 ENCOUNTER — Other Ambulatory Visit: Payer: Self-pay | Admitting: Cardiovascular Disease

## 2020-06-08 DIAGNOSIS — D0462 Carcinoma in situ of skin of left upper limb, including shoulder: Secondary | ICD-10-CM | POA: Diagnosis not present

## 2020-06-08 DIAGNOSIS — D485 Neoplasm of uncertain behavior of skin: Secondary | ICD-10-CM | POA: Diagnosis not present

## 2020-06-08 DIAGNOSIS — Z85828 Personal history of other malignant neoplasm of skin: Secondary | ICD-10-CM | POA: Diagnosis not present

## 2020-06-08 DIAGNOSIS — D0461 Carcinoma in situ of skin of right upper limb, including shoulder: Secondary | ICD-10-CM | POA: Diagnosis not present

## 2020-06-08 DIAGNOSIS — L821 Other seborrheic keratosis: Secondary | ICD-10-CM | POA: Diagnosis not present

## 2020-06-21 DIAGNOSIS — J449 Chronic obstructive pulmonary disease, unspecified: Secondary | ICD-10-CM | POA: Diagnosis not present

## 2020-07-05 DIAGNOSIS — M2550 Pain in unspecified joint: Secondary | ICD-10-CM | POA: Diagnosis not present

## 2020-07-05 DIAGNOSIS — M8580 Other specified disorders of bone density and structure, unspecified site: Secondary | ICD-10-CM | POA: Diagnosis not present

## 2020-07-05 DIAGNOSIS — M0579 Rheumatoid arthritis with rheumatoid factor of multiple sites without organ or systems involvement: Secondary | ICD-10-CM | POA: Diagnosis not present

## 2020-07-05 DIAGNOSIS — E669 Obesity, unspecified: Secondary | ICD-10-CM | POA: Diagnosis not present

## 2020-07-05 DIAGNOSIS — Z79899 Other long term (current) drug therapy: Secondary | ICD-10-CM | POA: Diagnosis not present

## 2020-07-05 DIAGNOSIS — Z6834 Body mass index (BMI) 34.0-34.9, adult: Secondary | ICD-10-CM | POA: Diagnosis not present

## 2020-07-05 DIAGNOSIS — M150 Primary generalized (osteo)arthritis: Secondary | ICD-10-CM | POA: Diagnosis not present

## 2020-07-21 DIAGNOSIS — J449 Chronic obstructive pulmonary disease, unspecified: Secondary | ICD-10-CM | POA: Diagnosis not present

## 2020-08-21 DIAGNOSIS — J449 Chronic obstructive pulmonary disease, unspecified: Secondary | ICD-10-CM | POA: Diagnosis not present

## 2020-09-15 ENCOUNTER — Encounter: Payer: Self-pay | Admitting: Vascular Surgery

## 2020-09-15 ENCOUNTER — Ambulatory Visit (INDEPENDENT_AMBULATORY_CARE_PROVIDER_SITE_OTHER): Payer: Medicare PPO | Admitting: Vascular Surgery

## 2020-09-15 ENCOUNTER — Other Ambulatory Visit: Payer: Self-pay

## 2020-09-15 ENCOUNTER — Ambulatory Visit (HOSPITAL_COMMUNITY)
Admission: RE | Admit: 2020-09-15 | Discharge: 2020-09-15 | Disposition: A | Discharge status: Home / Self Care | Payer: Medicare PPO | Source: Ambulatory Visit | Attending: Internal Medicine | Admitting: Internal Medicine

## 2020-09-15 VITALS — BP 135/84 | HR 82 | Temp 98.2°F | Resp 20 | Ht 69.0 in | Wt 232.0 lb

## 2020-09-15 DIAGNOSIS — I714 Abdominal aortic aneurysm, without rupture, unspecified: Secondary | ICD-10-CM

## 2020-09-15 NOTE — Progress Notes (Signed)
REASON FOR VISIT:   Follow-up of abdominal aortic aneurysm.  MEDICAL ISSUES:   ABDOMINAL AORTIC ANEURYSM: His aneurysm has not changed in size in the last 6 months.  Is 4.3 cm in maximum diameter.  He is not a smoker.  He quit in 1994.  His blood pressures been under good control.  I explained we would not consider elective repair of the aneurysm unless it reached 5.5 cm in maximum diameter in a normal risk patient.  I think it would be reasonable to do his follow-up duplex in 9 months I have ordered that test.  He will call sooner if he has problems.   HPI:   Jeffrey Stephens is a pleasant 79 y.o. male who I last saw on 03/10/2020.  His aneurysm had enlarged from 3.9 to 4.4 cm in a year and a half.  I explained that we would consider elective repair at 5.5 cm.  Given the enlargement I recommended a follow-up duplex scan in 6 months.  He comes in for that visit.  He denies any significant back pain.  He does have some indigestion and occasional abdominal pain.  He quit smoking in 1994.  His blood pressures been under good control.  Past Medical History:  Diagnosis Date   AAA (abdominal aortic aneurysm) ([place])    a. followed by Dr. Deitra Mayo - 01/2016 CT: 3.6 cm infrarenal AAA.   Abnormal nuclear cardiac imaging test 07/13/2014   a. 06/2014 MV: possible reversible inferior wall defect,no wma, nl EF, felt to be artifact.   Basal cell carcinoma    "LLE; some on my head"   BPH (benign prostatic hyperplasia)    Cerebral aneurysm 1994   Constipation    Depression    DVT (deep venous thrombosis) ([place]) 2001   Hx of Left leg    Dysrhythmia    AFIB    Frequency of urination    GERD (gastroesophageal reflux disease)    History of echocardiogram    a. 2D ECHO: 06/11/2014: EF 55-60%. Normal wall thickness. Indeterminate DD ( atrial flutter ). No RWMA. Mild LA dilation. Normal RV size and systolic function. No significant valvular abnormalities.   History of palpitations     OCCASIONAL   Kidney stones    Lung nodule    a. 02/2016 PET scan: intensely hypermetabolic 2.0 x 1.5 cm central RLL nodule (SUV 23).   Paroxysmal atrial flutter ([place])    a. On eliquis and sotalol (CHA2DS2VASc = 1).   Rheumatoid arthritis ([place])    "qwhere"   Stroke [facility]) 1994   "when I had ruptured aneurysm in my head"   TIA (transient ischemic attack)    1994 WITH ANEYRYSM    Family History  Problem Relation Age of Onset   Heart disease Father        Aneurysm   Diabetes Father    Heart attack Father    Heart disease Brother        Heart Disease before age 64   Cancer Brother    Hypertension Neg Hx    Stroke Neg Hx     SOCIAL HISTORY: Social History   Tobacco Use   Smoking status: Former Smoker    Packs/day: 2.50    Years: 37.00    Pack years: 92.50    Types: Cigarettes    Quit date: 09/25/1992    Years since quitting: 27.9   Smokeless tobacco: Never Used  Substance Use Topics   Alcohol use: No  Alcohol/week: 0.0 standard drinks    Allergies  Allergen Reactions   No Known Allergies Other (See Comments)    Current Outpatient Medications  Medication Sig Dispense Refill   acetaminophen (TYLENOL) 325 MG tablet Take 2 tablets (650 mg total) by mouth every 4 (four) hours as needed for headache or mild pain.     ELIQUIS 5 MG TABS tablet TAKE 1 TABLET BY MOUTH TWICE A DAY 149 tablet 1   folic acid (FOLVITE) 1 MG tablet Take 1 mg by mouth every morning.      guaiFENesin (MUCINEX) 600 MG 12 hr tablet Take 1 tablet (600 mg total) by mouth daily. 30 tablet 2   lansoprazole (PREVACID) 30 MG capsule Take 30 mg by mouth every morning.     methotrexate (RHEUMATREX) 2.5 MG tablet TAKE 5 TABLETS BY MOUTH ONCE A WEEK ON MONDAYS  1   methotrexate (RHEUMATREX) 5 MG tablet Take 5 mg by mouth once a week. Caution: Chemotherapy. Protect from light.     Multiple Vitamins-Minerals (CENTRUM SILVER ADULT 50+) TABS Take 50 mg by mouth daily.     Tiotropium  Bromide-Olodaterol (STIOLTO RESPIMAT) 2.5-2.5 MCG/ACT AERS Inhale 2 puffs into the lungs daily. 4 g 0   tamsulosin (FLOMAX) 0.4 MG CAPS capsule Take 0.4 mg by mouth daily. (Patient not taking: No sig reported)  11   No current facility-administered medications for this visit.    REVIEW OF SYSTEMS:  [X]  denotes positive finding, [ ]  denotes negative finding Cardiac  Comments:  Chest pain or chest pressure:    Shortness of breath upon exertion: x   Short of breath when lying flat:    Irregular heart rhythm:        Vascular    Pain in calf, thigh, or hip brought on by ambulation:    Pain in feet at night that wakes you up from your sleep:     Blood clot in your veins:    Leg swelling:         Pulmonary    Oxygen at home:    Productive cough:     Wheezing:         Neurologic    Sudden weakness in arms or legs:     Sudden numbness in arms or legs:     Sudden onset of difficulty speaking or slurred speech:    Temporary loss of vision in one eye:     Problems with dizziness:         Gastrointestinal    Blood in stool:     Vomited blood:         Genitourinary    Burning when urinating:     Blood in urine:        Psychiatric    Major depression:         Hematologic    Bleeding problems:    Problems with blood clotting too easily:        Skin    Rashes or ulcers:        Constitutional    Fever or chills:     PHYSICAL EXAM:   Vitals:   09/15/20 0823  BP: 135/84  Pulse: 82  Resp: 20  Temp: 98.2 F (36.8 C)  SpO2: 94%  Weight: 232 lb (105.2 kg)  Height: 5\' 9"  (1.753 m)   Body mass index is 34.26 kg/m.  GENERAL: The patient is a well-nourished male, in no acute distress. The vital signs are documented above. CARDIAC: There is a  regular rate and rhythm.  VASCULAR: I do not detect carotid bruits. He has palpable femoral, popliteal, and dorsalis pedis pulses bilaterally. PULMONARY: There is good air exchange bilaterally without wheezing or rales. ABDOMEN:  Soft and non-tender with normal pitched bowel sounds.  I cannot palpate his aneurysm because of his size. MUSCULOSKELETAL: There are no major deformities or cyanosis. NEUROLOGIC: No focal weakness or paresthesias are detected. SKIN: There are no ulcers or rashes noted. PSYCHIATRIC: The patient has a normal affect.  DATA:    DUPLEX ABDOMINAL AORTA: I have independently interpreted the duplex of his abdominal aorta.  The maximum diameter of his infrarenal aorta is 4.3 cm.  The right common iliac artery measures 1.4 cm in maximum diameter.  The left common iliac artery measures 1.3 cm in maximum diameter.  Deitra Mayo [facility] [phone]

## 2020-09-16 ENCOUNTER — Other Ambulatory Visit: Payer: Self-pay

## 2020-09-16 DIAGNOSIS — I714 Abdominal aortic aneurysm, without rupture, unspecified: Secondary | ICD-10-CM

## 2020-09-20 DIAGNOSIS — J449 Chronic obstructive pulmonary disease, unspecified: Secondary | ICD-10-CM | POA: Diagnosis not present

## 2020-09-22 ENCOUNTER — Encounter: Payer: Self-pay | Admitting: Cardiovascular Disease

## 2020-09-22 ENCOUNTER — Other Ambulatory Visit: Payer: Self-pay

## 2020-09-22 ENCOUNTER — Ambulatory Visit: Payer: Medicare PPO | Admitting: Cardiovascular Disease

## 2020-09-22 VITALS — BP 100/61 | HR 77 | Ht 69.5 in | Wt 233.8 lb

## 2020-09-22 DIAGNOSIS — I2510 Atherosclerotic heart disease of native coronary artery without angina pectoris: Secondary | ICD-10-CM

## 2020-09-22 DIAGNOSIS — I700 Atherosclerosis of aorta: Secondary | ICD-10-CM

## 2020-09-22 DIAGNOSIS — I714 Abdominal aortic aneurysm, without rupture, unspecified: Secondary | ICD-10-CM

## 2020-09-22 DIAGNOSIS — Z7901 Long term (current) use of anticoagulants: Secondary | ICD-10-CM

## 2020-09-22 DIAGNOSIS — I483 Typical atrial flutter: Secondary | ICD-10-CM

## 2020-09-22 NOTE — Progress Notes (Signed)
Patient ID: Jeffrey Stephens, male   DOB: Jul 06, 1941, 79 y.o.   MRN: [id number]    Cardiology Office Note    Date:  09/22/2020   ID:  Jeffrey Stephens, Jeffrey Stephens 07-20-1941, MRN [id number]  PCP:  Lavone Orn, MD  Cardiologist:   Sanda Klein, MD   Chief Complaint  Patient presents with  . Atrial Flutter follow up    History of Present Illness:  Jeffrey Stephens is a 79 y.o. male with a history of recurrent persistent atrial flutter that responded well to sotalol therapy, but medication stopped due to bradycardia and near-syncope. He declined pacemaker implantation and has done well with conservative therapy.  He has not been troubled by palpitations since his last appointment.  He denies chest discomfort at rest or with activity.  Activity is mostly limited by shortness of breath which really has not changed over the last year.  When he goes out to his mailbox he often has to stop to catch his breath on the way back (a total distance of about 200-250 feet).  He denies dyspnea at rest, orthopnea, PND, lower extremity edema.  He occasionally gets dizzy when he stands up, but he has not had any palpitations or syncope.  He denies any falls, injuries or bleeding problems on Eliquis.  He has not had any unilateral swelling or tenderness of the lower extremities (he also has a history of remote DVT of the lower extremities).  He has a small mid abdominal aortic aneurysm, most recently measured at 4.3 cm last week, followed by Dr. Scot Dock.  Roughly 5 years ago he underwent wedge resection of a right lower lobe stage I squamous cell carcinoma with curative intent. The chest CT showed a 4 cm ascending aortic aneurysm, but a follow up CT August 2018 describes the aorta as non-aneurysmal, 3.8 cm. He has a small fusiform AAA, 3.0 cm in 2012, 3.5 cm in May 2018, 3.9 cm in December 2019, 4.3 cm in December 2021.  He initially presented with paroxysmal atrial flutter in August 2015 in the setting of recent left  knee surgery. There was evidence of normal left ventricular systolic function by both echo and nuclear scintigraphy, he did not have significant valvular disease in his left atrium was only mildly dilated. He has calcified coronaries on CT of the chest but has never had angina. A nuclear study was interpreted as showing possible inferior wall reversible defect (question combination of diaphragmatic attenuation artifact and excessive intestinal activity). He was hospitalized in November 2015 with atrial flutter with rapid ventricular response and converted on treatment with sotalol. As far as we know he has never had any embolic events. He has a remote history of deep venous thrombosis of the lower extremities, AAA, brain aneurysm complicated by secondary hemorrhagic stroke but treated with successful clipping in the past.  Past Medical History:  Diagnosis Date  . AAA (abdominal aortic aneurysm) ([place])    a. followed by Dr. Deitra Mayo - 01/2016 CT: 3.6 cm infrarenal AAA.  Marland Kitchen Abnormal nuclear cardiac imaging test 07/13/2014   a. 06/2014 MV: possible reversible inferior wall defect,no wma, nl EF, felt to be artifact.  . Basal cell carcinoma    "LLE; some on my head"  . BPH (benign prostatic hyperplasia)   . Cerebral aneurysm 1994  . Constipation   . Depression   . DVT (deep venous thrombosis) ([place]) 2001   Hx of Left leg   . Dysrhythmia    AFIB   .  Frequency of urination   . GERD (gastroesophageal reflux disease)   . History of echocardiogram    a. 2D ECHO: 06/11/2014: EF 55-60%. Normal wall thickness. Indeterminate DD ( atrial flutter ). No RWMA. Mild LA dilation. Normal RV size and systolic function. No significant valvular abnormalities.  . History of palpitations    OCCASIONAL  . Kidney stones   . Lung nodule    a. 02/2016 PET scan: intensely hypermetabolic 2.0 x 1.5 cm central RLL nodule (SUV 23).  . Paroxysmal atrial flutter ([place])    a. On eliquis and sotalol (CHA2DS2VASc = 1).  .  Rheumatoid arthritis ([place])    "qwhere"  . Stroke [facility]) 1994   "when I had ruptured aneurysm in my head"  . TIA (transient ischemic attack)    1994 WITH ANEYRYSM    Past Surgical History:  Procedure Laterality Date  . BASAL CELL CARCINOMA EXCISION     "LLE; top of my head"  . CEREBRAL ANEURYSM REPAIR  1994   Hx of ruptured brain aneurysm Tx by Dr. Ellene Route  . CYSTOSCOPY W/ STONE MANIPULATION  1980's X 1  . EXCISIONAL HEMORRHOIDECTOMY  1970's  . JOINT REPLACEMENT     LT KNEE  . TOTAL KNEE ARTHROPLASTY Left 05/14/2014   Procedure: LEFT TOTAL KNEE ARTHROPLASTY;  Surgeon: Johnn Hai, MD;  Location: [facility]S;  Service: Orthopedics;  Laterality: Left;  Marland Kitchen VIDEO ASSISTED THORACOSCOPY (VATS)/WEDGE RESECTION Right 05/08/2016   Procedure: VIDEO ASSISTED THORACOSCOPY (VATS)/LUNG RESECTION;  Surgeon: Ivin Poot, MD;  Location: [facility];  Service: Thoracic;  Laterality: Right;    Current Medications: Outpatient Medications Prior to Visit  Medication Sig Dispense Refill  . acetaminophen (TYLENOL) 325 MG tablet Take 2 tablets (650 mg total) by mouth every 4 (four) hours as needed for headache or mild pain.    Marland Kitchen ELIQUIS 5 MG TABS tablet TAKE 1 TABLET BY MOUTH TWICE A DAY 588 tablet 1  . folic acid (FOLVITE) 1 MG tablet Take 1 mg by mouth every morning.     Marland Kitchen guaiFENesin (MUCINEX) 600 MG 12 hr tablet Take 1 tablet (600 mg total) by mouth daily. 30 tablet 2  . lansoprazole (PREVACID) 30 MG capsule Take 30 mg by mouth every morning.    . methotrexate (RHEUMATREX) 2.5 MG tablet TAKE 5 TABLETS BY MOUTH ONCE A WEEK ON MONDAYS  1  . methotrexate (RHEUMATREX) 5 MG tablet Take 5 mg by mouth once a week. Caution: Chemotherapy. Protect from light.    . Multiple Vitamins-Minerals (CENTRUM SILVER ADULT 50+) TABS Take 50 mg by mouth daily.    . tamsulosin (FLOMAX) 0.4 MG CAPS capsule Take 0.4 mg by mouth daily.  11  . Tiotropium Bromide-Olodaterol (STIOLTO RESPIMAT) 2.5-2.5 MCG/ACT AERS Inhale 2 puffs into the  lungs daily. 4 g 0   No facility-administered medications prior to visit.     Allergies:   No known allergies   Social History   Socioeconomic History  . Marital status: Married    Spouse name: Not on file  . Number of children: Not on file  . Years of education: Not on file  . Highest education level: Not on file  Occupational History  . Not on file  Tobacco Use  . Smoking status: Former Smoker    Packs/day: 2.50    Years: 37.00    Pack years: 92.50    Types: Cigarettes    Quit date: 09/25/1992    Years since quitting: 28.0  . Smokeless tobacco: Never  Used  Vaping Use  . Vaping Use: Never used  Substance and Sexual Activity  . Alcohol use: No    Alcohol/week: 0.0 standard drinks  . Drug use: No  . Sexual activity: Not Currently  Other Topics Concern  . Not on file  Social History Narrative  . Not on file   Social Determinants of Health   Financial Resource Strain: Not on file  Food Insecurity: Not on file  Transportation Needs: Not on file  Physical Activity: Not on file  Stress: Not on file  Social Connections: Not on file     Family History:  The patient's family history includes Cancer in his brother; Diabetes in his father; Heart attack in his father; Heart disease in his brother and father.   ROS:   Please see the history of present illness.    ROS All other systems are reviewed and are negative.  PHYSICAL EXAM:   VS:  BP 100/61   Pulse 77   Ht 5' 9.5" (1.765 m)   Wt 233 lb 12.8 oz (106.1 kg)   SpO2 94%   BMI 34.03 kg/m      General: Alert, oriented x3, no distress, moderately obese Head: no evidence of trauma, PERRL, EOMI, no exophtalmos or lid lag, no myxedema, no xanthelasma; normal ears, nose and oropharynx Neck: normal jugular venous pulsations and no hepatojugular reflux; brisk carotid pulses without delay and no carotid bruits Chest: Diminished breath sounds throughout, otherwise clear to auscultation, no signs of consolidation by  percussion or palpation, normal fremitus, symmetrical and full respiratory excursions Cardiovascular: normal position and quality of the apical impulse, regular rhythm, normal first and second heart sounds, no murmurs, rubs or gallops Abdomen: no tenderness or distention, no masses by palpation, no abnormal pulsatility or arterial bruits, normal bowel sounds, no hepatosplenomegaly Extremities: no clubbing, cyanosis or edema; 2+ radial, ulnar and brachial pulses bilaterally; 2+ right femoral, posterior tibial and dorsalis pedis pulses; 2+ left femoral, posterior tibial and dorsalis pedis pulses; no subclavian or femoral bruits Neurological: grossly nonfocal Psych: Normal mood and affect   Wt Readings from Last 3 Encounters:  09/22/20 233 lb 12.8 oz (106.1 kg)  09/15/20 232 lb (105.2 kg)  05/20/20 234 lb 12.8 oz (106.5 kg)      Studies/Labs Reviewed:   EKG:  EKG is ordered today. NSR, normal ECG Lipid Panel    Component Value Date/Time   CHOL 95 06/12/2014 0050   TRIG 80 06/12/2014 0050   HDL 32 (L) 06/12/2014 0050   CHOLHDL 3.0 06/12/2014 0050   VLDL 16 06/12/2014 0050   LDLCALC 47 06/12/2014 0050    09/30/2018 total cholesterol 122, HDL 36, LDL 62, triglycerides 120 ASSESSMENT:    1. Typical atrial flutter ([place])   2. Long term current use of anticoagulant   3. Coronary artery calcification seen on CT scan   4. AAA (abdominal aortic aneurysm) without rupture ([place])   5. Aortic atherosclerosis ([facility])      PLAN:  In order of problems listed above:  1. AFlutter: He does not have any palpitations, but could well be having silent arrhythmia events.  The arrhythmia was well controlled on sotalol but this caused excessive bradycardia and near syncope.  He prefers not to have a pacemaker implanted. 2. Eliquis: CHADS Vasc 3 (age 8, vascular disease).  Anticoagulation is indicated for both history of DVT and atrial flutter.  She has not had any bleeding complications. 3. Coronary Ca:  Does not have exertional angina.  Had a low risk nuclear study in the past. 4. AAA: Followed by Dr. Scot Dock and measured 4.3 cm on the most recent follow-up study. 5. Ao atherosclerosis: LDL cholesterol has been quite low.  4 cm ascending aneurysm reported on CT 05/2016 during f/u for lung neoplasm, but not confirmed and measured at 3.8 cm on follow up CT in 04/2017 and also not mentioned on the last CT in July 2019.  LDL cholesterol was 47 in 2015. 6. S/P lung Ca surgery: This causes exertional dyspnea, via combination of obstructive and restrictive lung disease.  Still has a 40-year history of 1 pack/day smoking, but quit in 1994.  Pulmonary function test presurgery showed an FEV1 of 80% of predicted (2.2 L/second).  Symptoms have been stable.  Weight loss would be beneficial.  Medication Adjustments/Labs and Tests Ordered: Current medicines are reviewed at length with the patient today.  Concerns regarding medicines are outlined above.  Medication changes, Labs and Tests ordered today are listed in the Patient Instructions below. Patient Instructions  Medication Instructions:  Your physician recommends that you continue on your current medications as directed. Please refer to the Current Medication list given to you today.  *If you need a refill on your cardiac medications before your next appointment, please call your pharmacy*  Follow-Up: At [facility], you and your health needs are our priority.  As part of our continuing mission to provide you with exceptional heart care, we have created designated Provider Care Teams.  These Care Teams include your primary Cardiologist (physician) and Advanced Practice Providers (APPs -  Physician Assistants and Nurse Practitioners) who all work together to provide you with the care you need, when you need it.  We recommend signing up for the patient portal called "MyChart".  Sign up information is provided on this After Visit Summary.  MyChart is used to  connect with patients for Virtual Visits (Telemedicine).  Patients are able to view lab/test results, encounter notes, upcoming appointments, etc.  Non-urgent messages can be sent to your provider as well.   To learn more about what you can do with MyChart, go to [email or web address].    Your next appointment:   12 month(s)  The format for your next appointment:   In Person  Provider:   Sanda Klein, MD        Signed, Sanda Klein, MD  09/22/2020 8:56 AM    [facility] [address], [place], [place]  [address] Phone: [phone]; Fax: [phone]

## 2020-09-22 NOTE — Patient Instructions (Signed)
Medication Instructions:  Your physician recommends that you continue on your current medications as directed. Please refer to the Current Medication list given to you today.  *If you need a refill on your cardiac medications before your next appointment, please call your pharmacy*  Follow-Up: At [facility], you and your health needs are our priority.  As part of our continuing mission to provide you with exceptional heart care, we have created designated Provider Care Teams.  These Care Teams include your primary Cardiologist (physician) and Advanced Practice Providers (APPs -  Physician Assistants and Nurse Practitioners) who all work together to provide you with the care you need, when you need it.  We recommend signing up for the patient portal called "MyChart".  Sign up information is provided on this After Visit Summary.  MyChart is used to connect with patients for Virtual Visits (Telemedicine).  Patients are able to view lab/test results, encounter notes, upcoming appointments, etc.  Non-urgent messages can be sent to your provider as well.   To learn more about what you can do with MyChart, go to [email or web address].    Your next appointment:   12 month(s)  The format for your next appointment:   In Person  Provider:   Sanda Klein, MD

## 2020-10-05 DIAGNOSIS — M0579 Rheumatoid arthritis with rheumatoid factor of multiple sites without organ or systems involvement: Secondary | ICD-10-CM | POA: Diagnosis not present

## 2020-10-05 DIAGNOSIS — Z79899 Other long term (current) drug therapy: Secondary | ICD-10-CM | POA: Diagnosis not present

## 2020-10-12 DIAGNOSIS — M069 Rheumatoid arthritis, unspecified: Secondary | ICD-10-CM | POA: Diagnosis not present

## 2020-10-12 DIAGNOSIS — Z0000 Encounter for general adult medical examination without abnormal findings: Secondary | ICD-10-CM | POA: Diagnosis not present

## 2020-10-12 DIAGNOSIS — I714 Abdominal aortic aneurysm, without rupture: Secondary | ICD-10-CM | POA: Diagnosis not present

## 2020-10-12 DIAGNOSIS — N529 Male erectile dysfunction, unspecified: Secondary | ICD-10-CM | POA: Diagnosis not present

## 2020-10-12 DIAGNOSIS — Z1389 Encounter for screening for other disorder: Secondary | ICD-10-CM | POA: Diagnosis not present

## 2020-10-12 DIAGNOSIS — K219 Gastro-esophageal reflux disease without esophagitis: Secondary | ICD-10-CM | POA: Diagnosis not present

## 2020-10-12 DIAGNOSIS — K5900 Constipation, unspecified: Secondary | ICD-10-CM | POA: Diagnosis not present

## 2020-10-12 DIAGNOSIS — M8580 Other specified disorders of bone density and structure, unspecified site: Secondary | ICD-10-CM | POA: Diagnosis not present

## 2020-10-12 DIAGNOSIS — I4892 Unspecified atrial flutter: Secondary | ICD-10-CM | POA: Diagnosis not present

## 2020-10-12 DIAGNOSIS — Z23 Encounter for immunization: Secondary | ICD-10-CM | POA: Diagnosis not present

## 2020-10-21 DIAGNOSIS — J449 Chronic obstructive pulmonary disease, unspecified: Secondary | ICD-10-CM | POA: Diagnosis not present

## 2020-11-10 DIAGNOSIS — N5201 Erectile dysfunction due to arterial insufficiency: Secondary | ICD-10-CM | POA: Diagnosis not present

## 2020-11-10 DIAGNOSIS — E349 Endocrine disorder, unspecified: Secondary | ICD-10-CM | POA: Diagnosis not present

## 2020-11-10 DIAGNOSIS — N402 Nodular prostate without lower urinary tract symptoms: Secondary | ICD-10-CM | POA: Diagnosis not present

## 2020-11-13 ENCOUNTER — Other Ambulatory Visit (HOSPITAL_COMMUNITY)
Admission: RE | Admit: 2020-11-13 | Discharge: 2020-11-13 | Disposition: A | Discharge status: Home / Self Care | Payer: Medicare PPO | Source: Ambulatory Visit | Attending: Emergency Medicine | Admitting: Emergency Medicine

## 2020-11-13 DIAGNOSIS — Z20822 Contact with and (suspected) exposure to covid-19: Secondary | ICD-10-CM | POA: Insufficient documentation

## 2020-11-13 DIAGNOSIS — Z01812 Encounter for preprocedural laboratory examination: Secondary | ICD-10-CM | POA: Insufficient documentation

## 2020-11-13 LAB — SARS CORONAVIRUS 2 (TAT 6-24 HRS): SARS Coronavirus 2: NEGATIVE

## 2020-11-17 ENCOUNTER — Other Ambulatory Visit: Payer: Self-pay

## 2020-11-17 ENCOUNTER — Ambulatory Visit: Payer: Medicare PPO | Admitting: Emergency Medicine

## 2020-11-17 ENCOUNTER — Encounter: Payer: Self-pay | Admitting: Emergency Medicine

## 2020-11-17 ENCOUNTER — Ambulatory Visit (INDEPENDENT_AMBULATORY_CARE_PROVIDER_SITE_OTHER): Payer: Medicare PPO | Admitting: Emergency Medicine

## 2020-11-17 DIAGNOSIS — J432 Centrilobular emphysema: Secondary | ICD-10-CM | POA: Diagnosis not present

## 2020-11-17 DIAGNOSIS — C3430 Malignant neoplasm of lower lobe, unspecified bronchus or lung: Secondary | ICD-10-CM | POA: Diagnosis not present

## 2020-11-17 LAB — PULMONARY FUNCTION TEST
DL/VA % pred: 51 %
DL/VA: 2.05 ml/min/mmHg/L
DLCO cor % pred: 53 %
DLCO cor: 12.04 ml/min/mmHg
DLCO unc % pred: 53 %
DLCO unc: 12.04 ml/min/mmHg
FEF 25-75 Post: 1.1 L/sec
FEF 25-75 Pre: 0.84 L/sec
FEF2575-%Change-Post: 30 %
FEF2575-%Pred-Post: 60 %
FEF2575-%Pred-Pre: 46 %
FEV1-%Change-Post: 9 %
FEV1-%Pred-Post: 84 %
FEV1-%Pred-Pre: 76 %
FEV1-Post: 2.17 L
FEV1-Pre: 1.98 L
FEV1FVC-%Change-Post: 0 %
FEV1FVC-%Pred-Pre: 75 %
FEV6-%Change-Post: 10 %
FEV6-%Pred-Post: 115 %
FEV6-%Pred-Pre: 104 %
FEV6-Post: 3.9 L
FEV6-Pre: 3.53 L
FEV6FVC-%Change-Post: 0 %
FEV6FVC-%Pred-Post: 104 %
FEV6FVC-%Pred-Pre: 104 %
FVC-%Change-Post: 10 %
FVC-%Pred-Post: 111 %
FVC-%Pred-Pre: 100 %
FVC-Post: 4.03 L
FVC-Pre: 3.65 L
Post FEV1/FVC ratio: 54 %
Post FEV6/FVC ratio: 97 %
Pre FEV1/FVC ratio: 54 %
Pre FEV6/FVC Ratio: 97 %
RV % pred: 112 %
RV: 2.76 L
TLC % pred: 101 %
TLC: 6.55 L

## 2020-11-17 MED ORDER — ALBUTEROL SULFATE HFA 108 (90 BASE) MCG/ACT IN AERS: 2.0000 | INHALATION_SPRAY | Freq: Four times a day (QID) | RESPIRATORY_TRACT | 6 refills | Status: DC | PRN

## 2020-11-17 MED ORDER — STIOLTO RESPIMAT 2.5-2.5 MCG/ACT IN AERS: 2.0000 | INHALATION_SPRAY | Freq: Every day | RESPIRATORY_TRACT | 0 refills | Status: DC

## 2020-11-17 NOTE — Assessment & Plan Note (Signed)
His pulmonary function testing today confirms obstruction, now moderate with an FEV1 1.98 L.  He does not remember trying to [facility] for last time.  I think he does need it based on his clinical history and his PFT.  We will restart now and continue.  I also explained to him the benefits of having albuterol to use as needed.  We will write a prescription for this.  Also add back his guaifenesin.

## 2020-11-17 NOTE — Assessment & Plan Note (Signed)
He needs 1 more annual CT chest to follow for stability in June 2021.  I will order this without contrast and follow with him since Dr. Darcey Nora has retired.

## 2020-11-17 NOTE — Patient Instructions (Signed)
Stiolto 2 puffs once a day.  Take this medicine every day as a maintenance inhaler. We will give you a prescription for albuterol.  This is a symptom driven, rescue inhaler.  You can use 2 puffs up to every 4 hours if you need it for shortness of breath, chest tightness, wheezing. Wear your oxygen at 2 L/min when you exert yourself. Restart guaifenesin 600 mg once a day.  We can give you a prescription for this. We will repeat your CT scan of the chest without contrast in June 2021 to follow-up for lung cancer. Follow with Dr. Lamonte Sakai in June after your CT so that we can review the results together.

## 2020-11-17 NOTE — Addendum Note (Signed)
Addended by: Gavin Potters R on: 11/17/2020 03:16 PM   Modules accepted: Orders

## 2020-11-17 NOTE — Progress Notes (Signed)
   Subjective:    Patient ID: Jeffrey Stephens, male    DOB: [birth year], 80 y.o.   MRN: [id number]  HPI 80 year old former smoker (90 pack years) previously followed by Dr. Gwenette Greet for COPD, hx DVT, A fib, RA on MTX, prior cerebral aneurysmal rupture, underwent right lower lobe resection for stage I poorly differentiated adenocarcinoma on 04/2016.  He reports that he is having progressive SOB over the last year. He has to stop to rest after 100 ft. He stopped his dedicated walking routine over a year ago. Not associated with CP or tightness. No trouble when sitting still or supine. He hears some occasional wheeze, with exertion. He has a lot of congestion, has to cough frequently - often clear, sometimes light green. Has to clear mucous first thing in the am. He was tried on some BD over a year ago  Pulmonary function testing 04/06/2016 reviewed by me, show mild obstruction without a bronchodilator response, FEV1 2.21 L (80% predicted), normal lung volumes (prior to his resection), significantly decreased diffusion capacity that does not correct when adjusted for his alveolar volume.  Most recent CT chest with 03/03/2020, reviewed by me, shows severe centrilobular paraseptal emphysema, right lower lobe surgical changes, scattered stable pulmonary nodules including a calcified nodule in the right lower lobe.   ROV 11/17/20 --this follow-up visit for 18 gentleman with COPD who I saw in August for progressive dyspnea.  He also has a history of right lower lobe resection for stage I adenocarcinoma (2017), DVT, A. fib, RA on methotrexate. At our last visit he started Stiolto to see if he would get benefit.  We also identified exertional desaturation at that visit and started him on oxygen at liters per minute.  Today he reports that he doesn't remember every getting the Stiolto or whether it helped him. He has O2 and a POC, but does not use reliably, sometimes when he gets SOB. He has SOB with walking on flat  ground. Unable to vacuum, etc. Some daily cough due to nasal drainage - wants to restart mucinex. Hears wheeze with exertion or rest.   He will need a repeat CT of his chest in June 2021, his 5th year   Repeat pulmonary function testing performed today reviewed by me, shows moderate obstruction without a bronchodilator response, normal volumes and a decreased diffusion capacity.  FEV1 down to 1.98 from 2.21 L in 2017.  Review of Systems As per HPI      Objective:   Physical Exam Vitals:   11/17/20 1328  BP: 128/70  Pulse: 85  Temp: 98 F (36.7 C)  TempSrc: Temporal  SpO2: 94%  Weight: 232 lb 3.2 oz (105.3 kg)  Height: 5' 9.5" (1.765 m)   Gen: Pleasant, obese, in no distress,  normal affect  ENT: No lesions,  mouth clear,  oropharynx clear, no postnasal drip  Neck: No JVD, no stridor  Lungs: No use of accessory muscles, distant, no crackles or wheezing on normal respiration, no wheeze on forced expiration  Cardiovascular: RRR, heart sounds normal, no murmur or gallops, no peripheral edema  Musculoskeletal: No deformities, no cyanosis or clubbing  Neuro: alert, awake, non focal  Skin: Warm, no lesions or rash     Assessment & Plan:  No problem-specific Assessment & Plan notes found for this encounter.  Baltazar Apo, MD, PhD 11/17/2020, 1:34 PM [facility] [phone] or if no answer [phone]

## 2020-11-17 NOTE — Progress Notes (Signed)
PFT done today. 

## 2020-11-21 DIAGNOSIS — J449 Chronic obstructive pulmonary disease, unspecified: Secondary | ICD-10-CM | POA: Diagnosis not present

## 2020-12-01 ENCOUNTER — Other Ambulatory Visit: Payer: Self-pay | Admitting: Cardiovascular Disease

## 2020-12-03 ENCOUNTER — Other Ambulatory Visit: Payer: Self-pay | Admitting: Emergency Medicine

## 2020-12-08 ENCOUNTER — Ambulatory Visit: Payer: Medicare PPO | Admitting: Podiatry

## 2020-12-08 ENCOUNTER — Encounter: Payer: Self-pay | Admitting: Podiatry

## 2020-12-08 ENCOUNTER — Ambulatory Visit (INDEPENDENT_AMBULATORY_CARE_PROVIDER_SITE_OTHER): Payer: Medicare PPO

## 2020-12-08 ENCOUNTER — Other Ambulatory Visit: Payer: Self-pay

## 2020-12-08 DIAGNOSIS — M79672 Pain in left foot: Secondary | ICD-10-CM | POA: Diagnosis not present

## 2020-12-08 DIAGNOSIS — M79671 Pain in right foot: Secondary | ICD-10-CM

## 2020-12-08 DIAGNOSIS — D689 Coagulation defect, unspecified: Secondary | ICD-10-CM | POA: Diagnosis not present

## 2020-12-08 DIAGNOSIS — M779 Enthesopathy, unspecified: Secondary | ICD-10-CM

## 2020-12-08 DIAGNOSIS — M2042 Other hammer toe(s) (acquired), left foot: Secondary | ICD-10-CM

## 2020-12-08 DIAGNOSIS — L84 Corns and callosities: Secondary | ICD-10-CM

## 2020-12-08 NOTE — Progress Notes (Signed)
Subjective:   Patient ID: Jeffrey Stephens, male   DOB: 80 y.o.   MRN: [id number]   HPI Patient presents stating he has had a severe elevation with rigid contracture of his second toe on his left foot with a fluid buildup of the joint and keratotic tissue formation and states that it is making it hard for him to wear shoe gear comfortably.  States that it is getting worse and it is no longer contacting the surface.  Patient does not smoke likes to be active   Review of Systems  All other systems reviewed and are negative.       Objective:  Physical Exam Vitals and nursing note reviewed.  Constitutional:      Appearance: He is well-developed.  Pulmonary:     Effort: Pulmonary effort is normal.  Musculoskeletal:        General: Normal range of motion.  Skin:    General: Skin is warm.  Neurological:     Mental Status: He is alert.     Neurovascular status found to be intact currently with patient noted to have moderate digital deformity structural bunion deformity left and second toe left severely elevated and medially dislocated with inflammation of the inner phalangeal joint.  There is fluid within the inner phalangeal joint and it is painful when palpated with patient found to have good digital perfusion well oriented x3     Assessment:  Severe dislocation digit to left possible flexor plate dislocation with noncontacting second toe inner phalangeal joint capsulitis keratotic tissue formation     Plan:  H NP reviewed conditions discussed treatment options discussed at 1 point either amputation or attempted digital fusion may be done.  At this point I did go ahead and I anesthetized the left second toe I then carefully injected the inner phalangeal joint 1 mg dexamethasone 1 mg Kenalog and then debrided lesion applied padding and will see back when symptomatic and may ultimately require surgical intervention which I educated him on today  X-ray indicates severe dorsal medial  dislocation digit to left

## 2020-12-13 DIAGNOSIS — Z85828 Personal history of other malignant neoplasm of skin: Secondary | ICD-10-CM | POA: Diagnosis not present

## 2020-12-13 DIAGNOSIS — L578 Other skin changes due to chronic exposure to nonionizing radiation: Secondary | ICD-10-CM | POA: Diagnosis not present

## 2020-12-19 DIAGNOSIS — J449 Chronic obstructive pulmonary disease, unspecified: Secondary | ICD-10-CM | POA: Diagnosis not present

## 2021-01-03 DIAGNOSIS — M2550 Pain in unspecified joint: Secondary | ICD-10-CM | POA: Diagnosis not present

## 2021-01-03 DIAGNOSIS — M150 Primary generalized (osteo)arthritis: Secondary | ICD-10-CM | POA: Diagnosis not present

## 2021-01-03 DIAGNOSIS — M0579 Rheumatoid arthritis with rheumatoid factor of multiple sites without organ or systems involvement: Secondary | ICD-10-CM | POA: Diagnosis not present

## 2021-01-03 DIAGNOSIS — Z79899 Other long term (current) drug therapy: Secondary | ICD-10-CM | POA: Diagnosis not present

## 2021-01-03 DIAGNOSIS — Z6834 Body mass index (BMI) 34.0-34.9, adult: Secondary | ICD-10-CM | POA: Diagnosis not present

## 2021-01-03 DIAGNOSIS — R0602 Shortness of breath: Secondary | ICD-10-CM | POA: Diagnosis not present

## 2021-01-03 DIAGNOSIS — E669 Obesity, unspecified: Secondary | ICD-10-CM | POA: Diagnosis not present

## 2021-01-19 DIAGNOSIS — J449 Chronic obstructive pulmonary disease, unspecified: Secondary | ICD-10-CM | POA: Diagnosis not present

## 2021-02-18 DIAGNOSIS — J449 Chronic obstructive pulmonary disease, unspecified: Secondary | ICD-10-CM | POA: Diagnosis not present

## 2021-02-28 ENCOUNTER — Other Ambulatory Visit: Payer: Self-pay

## 2021-02-28 ENCOUNTER — Ambulatory Visit (INDEPENDENT_AMBULATORY_CARE_PROVIDER_SITE_OTHER): Payer: Medicare PPO | Admitting: Podiatry

## 2021-02-28 ENCOUNTER — Encounter: Payer: Self-pay | Admitting: Podiatry

## 2021-02-28 DIAGNOSIS — M2042 Other hammer toe(s) (acquired), left foot: Secondary | ICD-10-CM | POA: Diagnosis not present

## 2021-02-28 DIAGNOSIS — D689 Coagulation defect, unspecified: Secondary | ICD-10-CM

## 2021-02-28 DIAGNOSIS — L84 Corns and callosities: Secondary | ICD-10-CM | POA: Diagnosis not present

## 2021-02-28 MED ORDER — DOXYCYCLINE HYCLATE 100 MG PO TABS: 100.0000 mg | ORAL_TABLET | Freq: Two times a day (BID) | ORAL | 1 refills | Status: DC

## 2021-03-01 NOTE — Progress Notes (Signed)
Subjective:   Patient ID: Jeffrey Stephens, male   DOB: 80 y.o.   MRN: [id number]   HPI Patient presents stating that this left second toe has gotten worse recently and the pain is becoming more consistent and hard for him to wear shoe gear with.  Admits he used medicated corn pads over the last month   ROS      Objective:  Physical Exam  Neurovascular status intact with patient found to have a rigidly elevated second digit left with dorsal keratotic lesion around the proximal interphalangeal joint with slight redness around the area and obvious irritation from utilizing chemical medication     Assessment:  Inflammatory capsulitis digit to left with moderate rigid deformity of the toe and the possibility for low-grade localized infection     Plan:  H&P debrided the area discussed surgical intervention with digital fusion or amputation and I discussed with him infection which may be present and after debridement I did place on doxycycline as precautionary measure.  Patient will come in if any further redness occurs and will be seen back for [place] to recheck and will have surgical discussion in 2 weeks depending on response to conservative treatment

## 2021-03-14 ENCOUNTER — Ambulatory Visit: Payer: Medicare PPO | Admitting: Podiatry

## 2021-03-14 ENCOUNTER — Other Ambulatory Visit: Payer: Self-pay

## 2021-03-14 DIAGNOSIS — M2042 Other hammer toe(s) (acquired), left foot: Secondary | ICD-10-CM

## 2021-03-14 DIAGNOSIS — M779 Enthesopathy, unspecified: Secondary | ICD-10-CM

## 2021-03-14 DIAGNOSIS — M0579 Rheumatoid arthritis with rheumatoid factor of multiple sites without organ or systems involvement: Secondary | ICD-10-CM | POA: Diagnosis not present

## 2021-03-14 DIAGNOSIS — D689 Coagulation defect, unspecified: Secondary | ICD-10-CM

## 2021-03-14 MED ORDER — TRIAMCINOLONE ACETONIDE 10 MG/ML IJ SUSP
10.0000 mg | Freq: Once | INTRAMUSCULAR | Status: AC
  Administered 2021-03-14: 16:00:00 10 mg

## 2021-03-15 ENCOUNTER — Ambulatory Visit
Admission: RE | Admit: 2021-03-15 | Discharge: 2021-03-15 | Disposition: A | Discharge status: Home / Self Care | Payer: Medicare PPO | Source: Ambulatory Visit | Attending: Emergency Medicine | Admitting: Emergency Medicine

## 2021-03-15 DIAGNOSIS — C3430 Malignant neoplasm of lower lobe, unspecified bronchus or lung: Secondary | ICD-10-CM

## 2021-03-15 DIAGNOSIS — R918 Other nonspecific abnormal finding of lung field: Secondary | ICD-10-CM | POA: Diagnosis not present

## 2021-03-15 DIAGNOSIS — J432 Centrilobular emphysema: Secondary | ICD-10-CM

## 2021-03-17 NOTE — Progress Notes (Addendum)
Subjective:   Patient ID: Jeffrey Stephens, male   DOB: 80 y.o.   MRN: [id number]   HPI Patient presents with pain around the big toe joint left stating its been sore and inflamed and he is having trouble walking consistently.  Patient also states he is got a get the second toe effects as he is having trouble wearing shoe gear   ROS      Objective:  Physical Exam  Neurovascular status intact with inflammation pain around the first MPJ left with fluid buildup around the joint surface and pain with motion with no crepitus.  Found to have severe elevation second toe left which makes it very difficult to wear shoe gear     Assessment:  Inflammatory capsulitis first MPJ left with severe hammertoe deformity severe forefoot structural deformity     Plan:  H&P reviewed condition recommended conservative care.  Discussed shoe gear modification and sterile prep injected the first MPJ 3 mg Dexasone Kenalog 5 mg Xylocaine advised on anti-inflammatories reappoint to recheck and reviewed consent form for digital fusion of digit to left with possibility I will have to move some other stuff around.  I did discuss there may be some ancillary procedures I will need to do at this point get a try to just see if I can straighten the second toe and give him relief of his symptoms.  Patient is encouraged to call questions concerns tentatively scheduled for outpatient surgery and he will hold his Eliquis 2 days prior to the surgery

## 2021-03-18 ENCOUNTER — Telehealth: Payer: Self-pay | Admitting: Urology

## 2021-03-18 ENCOUNTER — Ambulatory Visit: Payer: Medicare PPO | Admitting: Thoracic Surgery (Cardiothoracic Vascular Surgery)

## 2021-03-18 NOTE — Telephone Encounter (Signed)
DOS - 04/05/21   HAMMERTOE REPAIR 2ND LEFT--- [id number]   [employer] EFFECTIVE DATE - 09/26/19   PER [employer] CPT CODE 27035 HAS BEEN APPROVED, AUTH #[id number].

## 2021-03-21 DIAGNOSIS — H2513 Age-related nuclear cataract, bilateral: Secondary | ICD-10-CM | POA: Diagnosis not present

## 2021-03-21 DIAGNOSIS — J449 Chronic obstructive pulmonary disease, unspecified: Secondary | ICD-10-CM | POA: Diagnosis not present

## 2021-03-21 DIAGNOSIS — H524 Presbyopia: Secondary | ICD-10-CM | POA: Diagnosis not present

## 2021-03-21 DIAGNOSIS — H52223 Regular astigmatism, bilateral: Secondary | ICD-10-CM | POA: Diagnosis not present

## 2021-03-21 DIAGNOSIS — H5203 Hypermetropia, bilateral: Secondary | ICD-10-CM | POA: Diagnosis not present

## 2021-03-22 ENCOUNTER — Encounter: Payer: Self-pay | Admitting: Primary Care

## 2021-03-22 ENCOUNTER — Ambulatory Visit: Payer: Medicare PPO | Admitting: Primary Care

## 2021-03-22 ENCOUNTER — Ambulatory Visit: Payer: Medicare PPO | Admitting: Emergency Medicine

## 2021-03-22 ENCOUNTER — Other Ambulatory Visit: Payer: Self-pay

## 2021-03-22 DIAGNOSIS — J432 Centrilobular emphysema: Secondary | ICD-10-CM

## 2021-03-22 DIAGNOSIS — J9611 Chronic respiratory failure with hypoxia: Secondary | ICD-10-CM

## 2021-03-22 DIAGNOSIS — R918 Other nonspecific abnormal finding of lung field: Secondary | ICD-10-CM

## 2021-03-22 DIAGNOSIS — J9621 Acute and chronic respiratory failure with hypoxia: Secondary | ICD-10-CM | POA: Insufficient documentation

## 2021-03-22 MED ORDER — STIOLTO RESPIMAT 2.5-2.5 MCG/ACT IN AERS: 2.0000 | INHALATION_SPRAY | Freq: Every day | RESPIRATORY_TRACT | 0 refills | Status: DC

## 2021-03-22 MED ORDER — AZITHROMYCIN 250 MG PO TABS: ORAL_TABLET | ORAL | 0 refills | Status: DC

## 2021-03-22 MED ORDER — TIOTROPIUM BROMIDE-OLODATEROL 2.5-2.5 MCG/ACT IN AERS: 2.0000 | INHALATION_SPRAY | Freq: Every day | RESPIRATORY_TRACT | 11 refills | Status: DC

## 2021-03-22 NOTE — Assessment & Plan Note (Signed)
-   CT chest on 03/15/21 showed stable small bilateral pulmonary nodules, largest measuring 17mm, these are compatible with benign process which do not require further follow-up

## 2021-03-22 NOTE — Patient Instructions (Addendum)
Imaging: CT chest on 03/15/21 showed severe changes or paraseptal and centrilobular emphysema with diffuse bronchial wall thickening. Stable small bilateral pulmonary nodules, largest measuring 46mm, these are compatible with benign process with do not require further follow-up   Recommendations:  - Resume Stiolto 2 puffs once a day (this you should take every day, no more or less) - Use your Albuterol rescue inhaler 2 puff every 6 hours as needed for sob/wheezing  - Take guaifenesin 600mg  twice daily as needed for congestion  - If cough does not improve add nasal spray such as Flonase over the counter - Continue to wear 2L oxygen with moderate exertion as needed to maintain O2 >88-90% - No additional CT imaging needed   RX: - Z-pack for cough/bronchitis  - Stiolto respimat   Follow-up: - 6 months with Dr. Lamonte Sakai or sooner if needed

## 2021-03-22 NOTE — Assessment & Plan Note (Signed)
-   Ambulatory O2 walk showed no significant oxygen desaturations. His oxygen level stayed above 92% RA. Continue to use 2L with moderate exertions as needed to keep >88%

## 2021-03-22 NOTE — Progress Notes (Signed)
@Patient  ID: Jeffrey Stephens, male    DOB: 1941/05/07, 80 y.o.   MRN: [id number]  No chief complaint on file.   Referring provider: Lavone Orn, MD  HPI: 80 year old male, former smoker quit in 1994 (92.5-pack-year history).  Past medical history significant for COPD with emphysema, underwent right lower lobe resection for stage I poorly differentiated adenocarcinoma on 04/2016, DVT/afib (on anticoagulation), Coronary artery calcification, aortic arthrosclerosis, rheumatoid arthritis on MTX, skin cancer.  Patient of Dr. Lamonte Sakai, last seen in office on 11/17/2020.  Previous [facility] pulmonary encounter: He reports that he is having progressive SOB over the last year. He has to stop to rest after 100 ft. He stopped his dedicated walking routine over a year ago. Not associated with CP or tightness. No trouble when sitting still or supine. He hears some occasional wheeze, with exertion. He has a lot of congestion, has to cough frequently - often clear, sometimes light green. Has to clear mucous first thing in the am. He was tried on some BD over a year ago   ROV 11/17/20 -- Dr. Lamonte Sakai  this follow-up visit for 61 gentleman with COPD who I saw in August for progressive dyspnea.  He also has a history of right lower lobe resection for stage I adenocarcinoma (2017), DVT, A. fib, RA on methotrexate. At our last visit he started Stiolto to see if he would get benefit.  We also identified exertional desaturation at that visit and started him on oxygen at liters per minute.  Today he reports that he doesn't remember every getting the Stiolto or whether it helped him. He has O2 and a POC, but does not use reliably, sometimes when he gets SOB. He has SOB with walking on flat ground. Unable to vacuum, etc. Some daily cough due to nasal drainage - wants to restart mucinex. Hears wheeze with exertion or rest.    He will need a repeat CT of his chest in June 2021, his 5th year    Repeat pulmonary function testing  performed today reviewed by me, shows moderate obstruction without a bronchodilator response, normal volumes and a decreased diffusion capacity.  FEV1 down to 1.98 from 2.21 L in 2017.    03/22/2021- Interim hx  Patient presents today for 5-month follow-up. He has moderate obstruction on PFTs. During last visit Dr. Lamonte Sakai gave him a trial Stiolto. He gets short winded walking out to the mail box approx 100 feet. He has an occasional productive cough with green mucus. He is not currently using Stiolto, states that he did not understand the instructions. He has albuterol recue inhaler on hand. He takes mucinex 1 tab daily without much improvement. He does not wear his oxygen all the time but when he does he uses 4L. He does not wear oxygen at night. CT chest on 03/15/21 showed severe changes or emphysema and stable small bilateral pulmonary nodules, largest measuring 40mm.   Testing: Pulmonary function testing 04/06/2016 reviewed by me, show mild obstruction without a bronchodilator response, FEV1 2.21 L (80% predicted), normal lung volumes (prior to his resection), significantly decreased diffusion capacity that does not correct when adjusted for his alveolar volume.   Most recent CT chest with 03/03/2020, reviewed by me, shows severe centrilobular paraseptal emphysema, right lower lobe surgical changes, scattered stable pulmonary nodules including a calcified nodule in the right lower lobe.   CT chest on 03/15/21 showed severe changes or paraseptal and centrilobular emphysema with diffuse bronchial wall thickening. Stable small bilateral pulmonary nodules, largest  measuring 61mm, these are compatible with benign process which do not require further follow-up   Allergies  Allergen Reactions   No Known Allergies Other (See Comments)    Immunization History  Administered Date(s) Administered   Fluad Quad(high Dose 65+) 07/08/2019   H1N1 12/02/2008   Influenza Split 07/01/2009, 06/21/2011, 07/04/2012,  07/10/2013, 06/25/2014, 07/02/2015, 06/22/2016, 07/09/2017, 06/25/2018, 07/02/2020   Influenza, High Dose Seasonal PF 07/09/2017, 07/01/2018   Influenza,inj,Quad PF,6+ Mos 06/12/2014   Influenza-Unspecified 06/28/2019   PFIZER(Purple Top)SARS-COV-2 Vaccination 10/10/2019, 11/10/2019, 06/29/2020   Pneumococcal Conjugate-13 08/31/2014   Pneumococcal Polysaccharide-23 06/25/2008   Td 03/20/2005   Tdap 09/06/2015   Zoster, Live 10/12/2020    Past Medical History:  Diagnosis Date   AAA (abdominal aortic aneurysm) ([place])    a. followed by Dr. Deitra Mayo - 01/2016 CT: 3.6 cm infrarenal AAA.   Abnormal nuclear cardiac imaging test 07/13/2014   a. 06/2014 MV: possible reversible inferior wall defect,no wma, nl EF, felt to be artifact.   Basal cell carcinoma    "LLE; some on my head"   BPH (benign prostatic hyperplasia)    Cerebral aneurysm 1994   Constipation    Depression    DVT (deep venous thrombosis) ([place]) 2001   Hx of Left leg    Dysrhythmia    AFIB    Frequency of urination    GERD (gastroesophageal reflux disease)    History of echocardiogram    a. 2D ECHO: 06/11/2014: EF 55-60%. Normal wall thickness. Indeterminate DD ( atrial flutter ). No RWMA. Mild LA dilation. Normal RV size and systolic function. No significant valvular abnormalities.   History of palpitations    OCCASIONAL   Kidney stones    Lung nodule    a. 02/2016 PET scan: intensely hypermetabolic 2.0 x 1.5 cm central RLL nodule (SUV 23).   Paroxysmal atrial flutter ([place])    a. On eliquis and sotalol (CHA2DS2VASc = 1).   Rheumatoid arthritis ([place])    "qwhere"   Stroke [facility]) 1994   "when I had ruptured aneurysm in my head"   TIA (transient ischemic attack)    1994 WITH ANEYRYSM    Tobacco History: Social History   Tobacco Use  Smoking Status Former   Packs/day: 2.50   Years: 37.00   Pack years: 92.50   Types: Cigarettes   Quit date: 09/25/1992   Years since quitting: 28.5  Smokeless Tobacco Never    Counseling given: Not Answered   Outpatient Medications Prior to Visit  Medication Sig Dispense Refill   acetaminophen (TYLENOL) 325 MG tablet Take 2 tablets (650 mg total) by mouth every 4 (four) hours as needed for headache or mild pain.     albuterol (VENTOLIN HFA) 108 (90 Base) MCG/ACT inhaler TAKE 2 PUFFS BY MOUTH EVERY 6 HOURS AS NEEDED FOR WHEEZE OR SHORTNESS OF BREATH 8.5 each 6   doxycycline (VIBRA-TABS) 100 MG tablet Take 1 tablet (100 mg total) by mouth 2 (two) times daily. 20 tablet 1   ELIQUIS 5 MG TABS tablet TAKE 1 TABLET BY MOUTH TWICE A DAY 902 tablet 1   folic acid (FOLVITE) 1 MG tablet Take 1 mg by mouth every morning.      guaiFENesin (MUCINEX) 600 MG 12 hr tablet Take 1 tablet (600 mg total) by mouth daily. 30 tablet 2   lansoprazole (PREVACID) 30 MG capsule Take 30 mg by mouth every morning.     methotrexate (RHEUMATREX) 2.5 MG tablet   1   methotrexate (RHEUMATREX) 5 MG  tablet Take 5 mg by mouth once a week. Caution: Chemotherapy. Protect from light.     Multiple Vitamins-Minerals (CENTRUM SILVER ADULT 50+) TABS Take 50 mg by mouth daily.     tamsulosin (FLOMAX) 0.4 MG CAPS capsule Take 0.4 mg by mouth daily.  11   Tiotropium Bromide-Olodaterol (STIOLTO RESPIMAT) 2.5-2.5 MCG/ACT AERS Inhale 2 puffs into the lungs daily. 4 g 0   No facility-administered medications prior to visit.   Review of Systems  Review of Systems  HENT: Negative.    Respiratory:  Positive for cough.        DOE  Cardiovascular: Negative.     Physical Exam  BP 132/82 (BP Location: Left Arm, Patient Position: Sitting, Cuff Size: Normal)   Pulse 82   Temp 97.6 F (36.4 C) (Oral)   Ht 5\' 10"  (1.778 m)   Wt 231 lb (104.8 kg)   SpO2 94%   BMI 33.15 kg/m  Physical Exam Constitutional:      Appearance: Normal appearance.  HENT:     Head: Normocephalic and atraumatic.     Mouth/Throat:     Mouth: Mucous membranes are moist.     Pharynx: Oropharynx is clear.  Cardiovascular:      Rate and Rhythm: Normal rate and regular rhythm.  Pulmonary:     Effort: Pulmonary effort is normal.     Breath sounds: Normal breath sounds. No wheezing, rhonchi or rales.     Comments: O2 92% RA with ambulation Skin:    General: Skin is warm and dry.  Neurological:     General: No focal deficit present.     Mental Status: He is alert and oriented to person, place, and time. Mental status is at baseline.  Psychiatric:        Mood and Affect: Mood normal.        Behavior: Behavior normal.        Thought Content: Thought content normal.        Judgment: Judgment normal.     Lab Results:  CBC    Component Value Date/Time   WBC 10.5 05/11/2016 0310   RBC 4.19 (L) 05/11/2016 0310   HGB 13.0 05/11/2016 0310   HCT 39.9 05/11/2016 0310   PLT 170 05/11/2016 0310   MCV 95.2 05/11/2016 0310   MCV 90.5 01/26/2016 1119   MCH 31.0 05/11/2016 0310   MCHC 32.6 05/11/2016 0310   RDW 14.5 05/11/2016 0310   LYMPHSABS 1.4 08/14/2014 0426   MONOABS 0.4 08/14/2014 0426   EOSABS 0.2 08/14/2014 0426   BASOSABS 0.0 08/14/2014 0426    BMET    Component Value Date/Time   NA 141 05/12/2016 0305   K 5.2 (H) 05/12/2016 0305   CL 105 05/12/2016 0305   CO2 32 05/12/2016 0305   GLUCOSE 111 (H) 05/12/2016 0305   BUN 11 05/12/2016 0305   CREATININE 1.01 05/12/2016 0305   CREATININE 1.07 01/26/2016 1053   CALCIUM 9.0 05/12/2016 0305   GFRNONAA >60 05/12/2016 0305   GFRNONAA 68 01/26/2016 1053   GFRAA >60 05/12/2016 0305   GFRAA 79 01/26/2016 1053    BNP No results found for: BNP  ProBNP    Component Value Date/Time   PROBNP 1,027.0 (H) 08/12/2014 1139    Imaging: CT Chest Wo Contrast  Result Date: 03/15/2021 CLINICAL DATA:  Evaluate pulmonary nodules. EXAM: CT CHEST WITHOUT CONTRAST TECHNIQUE: Multidetector CT imaging of the chest was performed following the standard protocol without IV contrast. COMPARISON:  03/03/2020 FINDINGS: Cardiovascular:  Normal heart size. No pericardial  effusion. Aortic atherosclerosis. Coronary artery calcifications. Mediastinum/Nodes: No enlarged mediastinal or axillary lymph nodes. Thyroid gland, trachea, and esophagus demonstrate no significant findings. Lungs/Pleura: Severe changes of paraseptal and centrilobular emphysema with diffuse bronchial wall thickening. No pleural effusion, airspace consolidation, atelectasis, or pneumothorax. Perifissural nodule along the posterior major fissure is unchanged measuring 3 mm, image 68/5. 3 mm left upper lobe lung nodule is stable, image 79/5. 5 mm right middle lobe lung nodule is identified, image 95/5. Unchanged. 4 mm right middle lobe lung nodule is stable, image 116/5. 3 mm nodule in the posterior right upper lobe abutting the major fissure is stable, image 74/5. No new lung nodules identified Upper Abdomen: No acute abnormality. Bilateral kidney cysts. Aortic atherosclerosis Musculoskeletal: Spondylosis identified within the thoracic spine. No acute or suspicious osseous findings. IMPRESSION: 1. Stable small bilateral pulmonary nodules. These are compatible with a benign process and do not require further follow-up. This recommendation follows the consensus statement: Guidelines for Management of Incidental Pulmonary Nodules Detected on CT Images: From the Fleischner Society 2017; Radiology 2017; 284:228-243. 2. Diffuse bronchial wall thickening with emphysema, as above; imaging findings suggestive of underlying COPD. 3. Coronary artery calcifications noted. Aortic Atherosclerosis (ICD10-I70.0) and Emphysema (ICD10-J43.9). Electronically Signed   By: Kerby Moors M.D.   On: 03/15/2021 21:51     Assessment & Plan:   COPD with emphysema ([place]) - AECOPD; Dyspnea walking 139ft or more. Productive cough with purulent sputum. Not currently using maintenance inhaler. Taking mucinex 600mg  once daily. Plan treat with Zpack and resume Stiolto respimat 2 puffs once daily. Advised he take mucinex twice daily. If no  improvement with above recommend adding OTC nasal spray such as flonase.   Pulmonary nodules - CT chest on 03/15/21 showed stable small bilateral pulmonary nodules, largest measuring 59mm, these are compatible with benign process which do not require further follow-up   Chronic respiratory failure with hypoxia ([facility]) - Ambulatory O2 walk showed no significant oxygen desaturations. His oxygen level stayed above 92% RA. Continue to use 2L with moderate exertions as needed to keep >88%   40 mins spent on case with > 50% face to face  Martyn Ehrich, NP 03/22/2021

## 2021-03-22 NOTE — Assessment & Plan Note (Signed)
-   AECOPD; Dyspnea walking 122ft or more. Productive cough with purulent sputum. Not currently using maintenance inhaler. Taking mucinex 600mg  once daily. Plan treat with Zpack and resume Stiolto respimat 2 puffs once daily. Advised he take mucinex twice daily. If no improvement with above recommend adding OTC nasal spray such as flonase.

## 2021-03-25 ENCOUNTER — Ambulatory Visit: Payer: Medicare PPO | Admitting: Thoracic Surgery (Cardiothoracic Vascular Surgery)

## 2021-03-25 ENCOUNTER — Other Ambulatory Visit: Payer: Self-pay

## 2021-03-25 ENCOUNTER — Encounter: Payer: Self-pay | Admitting: Thoracic Surgery (Cardiothoracic Vascular Surgery)

## 2021-03-25 VITALS — BP 137/77 | HR 83 | Resp 20 | Ht 70.0 in | Wt 229.0 lb

## 2021-03-25 DIAGNOSIS — Z85118 Personal history of other malignant neoplasm of bronchus and lung: Secondary | ICD-10-CM | POA: Diagnosis not present

## 2021-03-25 DIAGNOSIS — R911 Solitary pulmonary nodule: Secondary | ICD-10-CM

## 2021-03-25 NOTE — Progress Notes (Signed)
      [address]       [place], [address]             [phone]        Brack H Minichiello Saxman Medical Record #[id number] Date of Birth: 06-09-1941  Referring: Lavone Orn, MD Primary Care: Lavone Orn, MD Primary Cardiologist:[name], MD  Reason for visit:   follow-up  History of Present Illness:     80 year old male presents for 1 year follow-up and image review.  In 2017, he underwent a right VATS wedge resection for poorly differentiated adenocarcinoma by Dr. Darcey Nora.  He is undergone annual surveillance CT scans which have all been negative.  He has no complaints today.  Physical Exam: BP 137/77   Pulse 83   Resp 20   Ht 5\' 10"  (1.778 m)   Wt 229 lb (103.9 kg)   SpO2 90% Comment: RA  BMI 32.86 kg/m   Alert NAD Abdomen ND No peripheral edema   Diagnostic Studies & Laboratory data: 03/15/2021 IMPRESSION: 1. Stable small bilateral pulmonary nodules. These are compatible with a benign process and do not require further follow-up. This recommendation follows the consensus statement: Guidelines for Management of Incidental Pulmonary Nodules Detected on CT Images: From the Fleischner Society 2017; Radiology 2017; [id number]. 2. Diffuse bronchial wall thickening with emphysema, as above; imaging findings suggestive of underlying COPD. 3. Coronary artery calcifications noted.    Assessment / Plan:   80 year old male status post right VATS and wedge resection for a poorly differentiated adenocarcinoma in 2017.  Cross-sectional imaging from earlier last month shows no evidence of recurrence.  He is currently 5 years out from his original surgery.  He does have some small benign-appearing bilateral pulmonary nodules which have all been stable.  He is currently being followed by pulmonary medicine, and can continue to follow-up with them.  Given his history of lung cancer we may consider enrolling him in the lung cancer screening program  however he is 80 years old this year thus he may of not meet criteria.  He will follow-up with [place] as needed.   Lajuana Matte 03/25/2021 5:46 PM      80 year old male presents

## 2021-03-29 ENCOUNTER — Telehealth: Payer: Self-pay | Admitting: Cardiovascular Disease

## 2021-03-29 NOTE — Telephone Encounter (Signed)
   [facility] Pre-operative Risk Assessment    Request for surgical clearance:  What type of surgery is being performed? Hammertoe Repair 2nd toe on left foot  When is this surgery scheduled? 04/05/21  What type of clearance is required (medical clearance vs. Pharmacy clearance to hold med vs. Both)? Pharmacy  Are there any medications that need to be held prior to surgery and how long?  Eliquis  Practice name and name of physician performing surgery? [facility] Dr. Ila Mcgill  What is your office phone number [phone]   7.   What is your office fax number [phone]  8.   Anesthesia type (None, local, MAC, general) ?  Choice   Larina Bras 03/29/2021, 4:15 PM  _________________________________________________________________   (provider comments below)

## 2021-03-30 NOTE — Telephone Encounter (Signed)
Patient with diagnosis of aflutter on Eliquis for anticoagulation.    Procedure: Hammertoe Repair 2nd toe on left foot Date of procedure: 04/05/21  Of note pt has a remote hx of DVT.  CHA2DS2-VASc Score = 3  This indicates a 3.2% annual risk of stroke. The patient's score is based upon: CHF History: No HTN History: No Diabetes History: No Stroke History: No Vascular Disease History: Yes Age Score: 2 Gender Score: 0    CrCl 53 ml/min Platelet count 252  Per office protocol, patient can hold Eliquis for 2 days prior to procedure.

## 2021-03-30 NOTE — Telephone Encounter (Signed)
   Name: Jeffrey Stephens  DOB: 05-Mar-1941  MRN: [id number]   Primary Cardiologist: Jeffrey Klein, MD  Chart reviewed as part of pre-operative protocol coverage. Patient was contacted 03/30/2021 in reference to pre-operative risk assessment for pending surgery as outlined below.  Jeffrey Stephens was last seen on 08/2020 by Dr. Sallyanne Stephens. History outlined includes atrial flutter, remote DVT, coronary calcification with low risk nuclear stress test in the past, AAA, aortic atherosclerosis, lung CA surgery, obesity, TIA with cerebral aneurysm, RA. Per Dr. Sallyanne Stephens, in 2015, nuclear study was interpreted as showing possible inferior wall reversible defect (question combination of diaphragmatic attenuation artifact and excessive intestinal activity. 2D echo 2015 showed normal EF. He has had chronic DOE ever since his lung surgery. RCRI 0.4% indicating low risk of CV complications. I reached out to patient for update on how he is doing. The patient affirms he has been doing well without any new cardiac symptoms. Chronic DOE is entirely unchanged from prior years without acceleration. No chest pain/angina.  Therefore, based on ACC/AHA guidelines, the patient would be at acceptable risk for the planned procedure without further cardiovascular testing.   The patient was advised that if he develops new symptoms prior to surgery to contact our office to arrange for a follow-up visit, and he verbalized understanding.  Per pharmD, "Per office protocol, patient can hold Eliquis for 2 days prior to procedure." We typically advise that blood thinners be resumed when felt safe by performing physician.  I will route this recommendation to the requesting party via Epic fax function and remove from pre-op pool. Please call with questions.  Jeffrey Pitter, PA-C 03/30/2021, 2:16 PM

## 2021-03-30 NOTE — Telephone Encounter (Signed)
   Name: Jeffrey Stephens  DOB: [birth year]  MRN: [id number]   Primary Cardiologist: Sanda Klein, MD  Chart reviewed as part of pre-operative protocol coverage. Will route to pharm then pt needs call.  Charlie Pitter, PA-C 03/30/2021, 11:14 AM

## 2021-04-04 MED ORDER — HYDROCODONE-ACETAMINOPHEN 10-325 MG PO TABS: 1.0000 | ORAL_TABLET | Freq: Three times a day (TID) | ORAL | 0 refills | Status: AC | PRN

## 2021-04-04 NOTE — Addendum Note (Signed)
Addended by: Wallene Huh on: 04/04/2021 01:37 PM   Modules accepted: Orders

## 2021-04-05 ENCOUNTER — Encounter: Payer: Self-pay | Admitting: Podiatry

## 2021-04-05 DIAGNOSIS — M2042 Other hammer toe(s) (acquired), left foot: Secondary | ICD-10-CM | POA: Diagnosis not present

## 2021-04-07 ENCOUNTER — Telehealth: Payer: Self-pay

## 2021-04-11 ENCOUNTER — Encounter: Payer: Self-pay | Admitting: Podiatry

## 2021-04-11 ENCOUNTER — Other Ambulatory Visit: Payer: Self-pay

## 2021-04-11 ENCOUNTER — Ambulatory Visit (INDEPENDENT_AMBULATORY_CARE_PROVIDER_SITE_OTHER): Payer: Medicare PPO

## 2021-04-11 ENCOUNTER — Ambulatory Visit (INDEPENDENT_AMBULATORY_CARE_PROVIDER_SITE_OTHER): Payer: Medicare PPO | Admitting: Podiatry

## 2021-04-11 DIAGNOSIS — Z9889 Other specified postprocedural states: Secondary | ICD-10-CM | POA: Diagnosis not present

## 2021-04-11 NOTE — Progress Notes (Signed)
Subjective:   Patient ID: Jeffrey Stephens, male   DOB: 80 y.o.   MRN: [id number]   HPI Patient presents stating doing well with surgery so far pleased   ROS      Objective:  Physical Exam  Neurovascular status intact negative Bevelyn Buckles' sign noted second digit in good position and it is fixated through the metatarsal     Assessment:  So far doing well with surgery with good digital perfusion stitches intact wound edges well coapted     Plan:  Explained that there may be some elevation of the toe and we take the pin after organ to try to hold this pin for 4 more weeks at the current time and patient will be seen back to recheck  X-ray indicates osteotomy is healing well fixation is in place holding toe down current

## 2021-04-20 ENCOUNTER — Telehealth: Payer: Self-pay

## 2021-04-20 DIAGNOSIS — J449 Chronic obstructive pulmonary disease, unspecified: Secondary | ICD-10-CM | POA: Diagnosis not present

## 2021-04-20 NOTE — Telephone Encounter (Signed)
Oxygen certification for [employer] prepared and placed in Dr. Agustina Caroli sign folder to be signed when he returns to clinic on 04/25/21.

## 2021-04-27 ENCOUNTER — Ambulatory Visit (INDEPENDENT_AMBULATORY_CARE_PROVIDER_SITE_OTHER): Payer: Medicare PPO

## 2021-04-27 ENCOUNTER — Other Ambulatory Visit: Payer: Self-pay

## 2021-04-27 DIAGNOSIS — Z9889 Other specified postprocedural states: Secondary | ICD-10-CM

## 2021-04-27 NOTE — Progress Notes (Signed)
Patient in office today to have sutures removed from 2nd digit left foot. Sutures removed without complication. Patient denies nausea, vomiting, fever and chills at this time. Advised patient to continue to monitor for signs and symptoms of infection.Patient will follow-up with Dr. Paulla Dolly for pin removal and x-ray in 2 weeks.

## 2021-05-09 NOTE — Telephone Encounter (Signed)
POC

## 2021-05-11 ENCOUNTER — Ambulatory Visit (INDEPENDENT_AMBULATORY_CARE_PROVIDER_SITE_OTHER): Payer: Medicare PPO | Admitting: Podiatry

## 2021-05-11 ENCOUNTER — Other Ambulatory Visit: Payer: Self-pay

## 2021-05-11 ENCOUNTER — Ambulatory Visit (INDEPENDENT_AMBULATORY_CARE_PROVIDER_SITE_OTHER): Payer: Medicare PPO

## 2021-05-11 ENCOUNTER — Encounter: Payer: Self-pay | Admitting: Podiatry

## 2021-05-11 DIAGNOSIS — Z9889 Other specified postprocedural states: Secondary | ICD-10-CM

## 2021-05-11 DIAGNOSIS — M2042 Other hammer toe(s) (acquired), left foot: Secondary | ICD-10-CM | POA: Diagnosis not present

## 2021-05-11 NOTE — Progress Notes (Signed)
Subjective:   Patient ID: Jeffrey Stephens, male   DOB: 80 y.o.   MRN: [id number]   HPI Patient presents stating the pin came out last night and very pleased with how I am doing so far   ROS      Objective:  Physical Exam  Neurovascular status intact negative Bevelyn Buckles' sign noted patient's left foot is doing well the toe is so far staying reasonably down slight elevation but has been extensively right after surgery and I did pin through the metatarsal     Assessment:  Optimism that the toe will stay down second left with good healing with mild swelling normal for this.  Postop     Plan:  H&P reviewed condition recommended that she continues to hold the second toe down and I did dispense a brace to both help with swelling of the ankle and foot and to hold the second toe down in a plantarflexed position with education given today  X-rays indicate satisfactory position of the second toe good alignment noted after pin was taken out

## 2021-05-16 ENCOUNTER — Ambulatory Visit
Admission: EM | Admit: 2021-05-16 | Discharge: 2021-05-16 | Disposition: A | Discharge status: Home / Self Care | Payer: Medicare PPO | Attending: Family Medicine | Admitting: Family Medicine

## 2021-05-16 DIAGNOSIS — J22 Unspecified acute lower respiratory infection: Secondary | ICD-10-CM

## 2021-05-16 DIAGNOSIS — U071 COVID-19: Secondary | ICD-10-CM

## 2021-05-16 MED ORDER — LEVOFLOXACIN 250 MG PO TABS: 250.0000 mg | ORAL_TABLET | Freq: Every day | ORAL | 0 refills | Status: DC

## 2021-05-16 MED ORDER — MOLNUPIRAVIR EUA 200MG CAPSULE: 4.0000 | ORAL_CAPSULE | Freq: Two times a day (BID) | ORAL | 0 refills | Status: AC

## 2021-05-16 MED ORDER — PROMETHAZINE-DM 6.25-15 MG/5ML PO SYRP: 5.0000 mL | ORAL_SOLUTION | Freq: Three times a day (TID) | ORAL | 0 refills | Status: DC | PRN

## 2021-05-16 NOTE — Discharge Instructions (Addendum)
The antiviral doesn't interact with your current medication therefore continue current medications as prescribed. Department.

## 2021-05-16 NOTE — ED Triage Notes (Signed)
Pt presents with c/o cough since Friday and tested positive for covid yesterday

## 2021-05-16 NOTE — ED Provider Notes (Signed)
[facility]    CSN: [id number] Arrival date & time: 05/16/21  1013      History   Chief Complaint Chief Complaint  Patient presents with   Covid Positive    HPI Jeffrey Stephens is a 80 y.o. male.   HPI Patient presents today for evaluation of COVID-19 symptoms. Patient tested positive 3 days ago. He suffers from underlying cardiovascular disease and AAA. He is interested in antiviral therapy. Denies any known history of renal disease or liver disease.  Past Medical History:  Diagnosis Date   AAA (abdominal aortic aneurysm) ([place])    a. followed by Dr. Deitra Mayo - 01/2016 CT: 3.6 cm infrarenal AAA.   Abnormal nuclear cardiac imaging test 07/13/2014   a. 06/2014 MV: possible reversible inferior wall defect,no wma, nl EF, felt to be artifact.   Basal cell carcinoma    "LLE; some on my head"   BPH (benign prostatic hyperplasia)    Cerebral aneurysm 1994   Constipation    Depression    DVT (deep venous thrombosis) ([place]) 2001   Hx of Left leg    Dysrhythmia    AFIB    Frequency of urination    GERD (gastroesophageal reflux disease)    History of echocardiogram    a. 2D ECHO: 06/11/2014: EF 55-60%. Normal wall thickness. Indeterminate DD ( atrial flutter ). No RWMA. Mild LA dilation. Normal RV size and systolic function. No significant valvular abnormalities.   History of palpitations    OCCASIONAL   Kidney stones    Lung nodule    a. 02/2016 PET scan: intensely hypermetabolic 2.0 x 1.5 cm central RLL nodule (SUV 23).   Paroxysmal atrial flutter ([place])    a. On eliquis and sotalol (CHA2DS2VASc = 1).   Rheumatoid arthritis ([place])    "qwhere"   Stroke [facility]) 1994   "when I had ruptured aneurysm in my head"   TIA (transient ischemic attack)    [place]    Patient Active Problem List   Diagnosis Date Noted   Chronic respiratory failure with hypoxia ([place]) 03/22/2021   COPD with emphysema ([place]) 03/03/2020   Aortic atherosclerosis ([place])  08/30/2018   Long term current use of anticoagulant 01/18/2017   Lung cancer, lower lobe ([place]) 05/08/2016   Anticoagulation adequate 08/14/2014   Atrial flutter, paroxysmal ([place]) 08/12/2014   Atrial flutter ([place]) 08/12/2014   Abnormal nuclear cardiac imaging test 07/13/2014   Coronary artery calcification seen on CT scan 06/17/2014   Atrial flutter with rapid ventricular response, converted with sotalol 06/11/2014   Left knee DJD 05/14/2014   Abdominal aortic aneurysm ([place]) 07/31/2008   Pulmonary nodules 07/31/2008   ARTHRITIS, RHEUMATOID 07/31/2008   SKIN CANCER, HX OF 07/31/2008   NEPHROLITHIASIS, HX OF 07/31/2008   BENIGN PROSTATIC HYPERTROPHY, HX OF 07/31/2008    Past Surgical History:  Procedure Laterality Date   BASAL CELL CARCINOMA EXCISION     "LLE; top of my head"   CEREBRAL ANEURYSM REPAIR  1994   Hx of ruptured brain aneurysm Tx by Dr. Ellene Route   CYSTOSCOPY W/ STONE MANIPULATION  1980's X 1   EXCISIONAL HEMORRHOIDECTOMY  1970's   JOINT REPLACEMENT     LT KNEE   TOTAL KNEE ARTHROPLASTY Left 05/14/2014   Procedure: LEFT TOTAL KNEE ARTHROPLASTY;  Surgeon: Johnn Hai, MD;  Location: [facility];  Service: Orthopedics;  Laterality: Left;   VIDEO ASSISTED THORACOSCOPY (VATS)/WEDGE RESECTION Right 05/08/2016   Procedure: VIDEO ASSISTED THORACOSCOPY (VATS)/LUNG RESECTION;  Surgeon: Ivin Poot, MD;  Location: [facility];  Service: Thoracic;  Laterality: Right;       Home Medications    Prior to Admission medications   Medication Sig Start Date End Date Taking? Authorizing Provider  levofloxacin (LEVAQUIN) 250 MG tablet Take 1 tablet (250 mg total) by mouth daily. 05/16/21  Yes Scot Jun, FNP  molnupiravir EUA 200 mg CAPS Take 4 capsules (800 mg total) by mouth 2 (two) times daily for 5 days. 05/16/21 05/21/21 Yes Scot Jun, FNP  promethazine-dextromethorphan (PROMETHAZINE-DM) 6.25-15 MG/5ML syrup Take 5 mLs by mouth 3 (three) times daily as needed for cough.  05/16/21  Yes Scot Jun, FNP  acetaminophen (TYLENOL) 325 MG tablet Take 2 tablets (650 mg total) by mouth every 4 (four) hours as needed for headache or mild pain. 08/14/14   [name], NP  albuterol (VENTOLIN HFA) 108 (90 Base) MCG/ACT inhaler TAKE 2 PUFFS BY MOUTH EVERY 6 HOURS AS NEEDED FOR WHEEZE OR SHORTNESS OF BREATH 12/03/20   [name], [name], MD  ELIQUIS 5 MG TABS tablet TAKE 1 TABLET BY MOUTH TWICE A DAY 12/01/20   Croitoru, Mihai, MD  folic acid (FOLVITE) 1 MG tablet Take 1 mg by mouth every morning.     [provider]  guaiFENesin (MUCINEX) 600 MG 12 hr tablet Take 1 tablet (600 mg total) by mouth daily. 05/20/20   Collene Gobble, MD  lansoprazole (PREVACID) 30 MG capsule Take 30 mg by mouth every morning.    [provider]  methotrexate (RHEUMATREX) 2.5 MG tablet  08/23/18   [provider]  methotrexate (RHEUMATREX) 5 MG tablet Take 5 mg by mouth once a week. Caution: Chemotherapy. Protect from light.    [provider]  Multiple Vitamins-Minerals (CENTRUM SILVER ADULT 50+) TABS Take 50 mg by mouth daily.    [provider]  tamsulosin (FLOMAX) 0.4 MG CAPS capsule Take 0.4 mg by mouth daily. 06/13/18   [provider]  Tiotropium Bromide-Olodaterol (STIOLTO RESPIMAT) 2.5-2.5 MCG/ACT AERS Inhale 2 puffs into the lungs daily. 03/22/21   Martyn Ehrich, NP  Tiotropium Bromide-Olodaterol 2.5-2.5 MCG/ACT AERS Inhale 2 puffs into the lungs daily. 03/22/21   Martyn Ehrich, NP    Family History Family History  Problem Relation Age of Onset   Heart disease Father        Aneurysm   Diabetes Father    Heart attack Father    Heart disease Brother        Heart Disease before age 33   Cancer Brother    Hypertension Neg Hx    Stroke Neg Hx     Social History Social History   Tobacco Use   Smoking status: Former    Packs/day: 2.50    Years: 37.00    Pack years: 92.50    Types: Cigarettes    Quit date:  09/25/1992    Years since quitting: 28.6   Smokeless tobacco: Never  Vaping Use   Vaping Use: Never used  Substance Use Topics   Alcohol use: No    Alcohol/week: 0.0 standard drinks   Drug use: No     Allergies   No known allergies   Review of Systems Review of Systems Pertinent negatives listed in HPI  Physical Exam Triage Vital Signs ED Triage Vitals [05/16/21 1148]  Enc Vitals Group     BP 103/63     Pulse Rate 81     Resp 20  Temp 98.1 F (36.7 C)     Temp src      SpO2 91 %     Weight      Height      Head Circumference      Peak Flow      Pain Score      Pain Loc      Pain Edu?      Excl. in [place]?    No data found.  Updated Vital Signs BP 103/63   Pulse 81   Temp 98.1 F (36.7 C)   Resp 20   SpO2 91% Comment: pt wears o2 at home  Visual Acuity Right Eye Distance:   Left Eye Distance:   Bilateral Distance:    Right Eye Near:   Left Eye Near:    Bilateral Near:     Physical Exam General appearance: Alert, Ill-appearing, no distress Head: Normocephalic, without obvious abnormality, atraumatic ENT: Ears normal, nares with mucosal edema, congestion,  oropharynx w/o exudate Respiratory: Respirations even , unlabored, coarse lung sound, positive rales, positive wheeze Heart: Rate and rhythm normal. No gallop or murmurs noted on exam  Extremities: No gross deformities Skin: Skin color, texture, turgor normal. No rashes seen  Psych: Appropriate mood and affect. Neurologic: GCS 15, normal coordination, normal gait  UC Treatments / Results  Labs (all labs ordered are listed, but only abnormal results are displayed) Labs Reviewed - No data to display  EKG   Radiology No results found.  Procedures Procedures (including critical care time)  Medications Ordered in UC Medications - No data to display  Initial Impression / Assessment and Plan / UC Course  I have reviewed the triage vital signs and the nursing notes.  Pertinent labs &  imaging results that were available during my care of the patient were reviewed by me and considered in my medical decision making (see chart for details).    COVID-19 infection, initiated antiviral therapy with molnupiravir ( no recent GFR on file). Educated that antiviral therapy may not be as effective given therapy is starting right at the 5 days window Covering for possible bronchitis vs PNA. Imaging is unavailable onsite today therefore will cover empirically for possible secondary PNA related to Covid. ER precautions given.  RTC PRN  Final Clinical Impressions(s) / UC    Final diagnoses:  COVID-19 virus infection  Lower respiratory infection (e.g., bronchitis, pneumonia, pneumonitis, pulmonitis)     Discharge Instructions      The antiviral doesn't interact with your current medication therefore continue current medications as prescribed. Department.   ED Prescriptions     Medication Sig Dispense Auth. Provider   molnupiravir EUA 200 mg CAPS Take 4 capsules (800 mg total) by mouth 2 (two) times daily for 5 days. 40 capsule Scot Jun, FNP   levofloxacin (LEVAQUIN) 250 MG tablet Take 1 tablet (250 mg total) by mouth daily. 7 tablet Scot Jun, FNP   promethazine-dextromethorphan (PROMETHAZINE-DM) 6.25-15 MG/5ML syrup Take 5 mLs by mouth 3 (three) times daily as needed for cough. 140 mL Scot Jun, FNP      PDMP not reviewed this encounter.   Scot Jun, [place] 05/20/21 [phone]

## 2021-05-21 DIAGNOSIS — J449 Chronic obstructive pulmonary disease, unspecified: Secondary | ICD-10-CM | POA: Diagnosis not present

## 2021-06-15 ENCOUNTER — Ambulatory Visit: Payer: Medicare PPO | Admitting: Vascular Surgery

## 2021-06-15 ENCOUNTER — Other Ambulatory Visit (HOSPITAL_COMMUNITY): Payer: Medicare PPO

## 2021-06-16 ENCOUNTER — Other Ambulatory Visit: Payer: Self-pay

## 2021-06-16 ENCOUNTER — Other Ambulatory Visit (HOSPITAL_COMMUNITY): Payer: Self-pay | Admitting: Registered Nurse

## 2021-06-16 ENCOUNTER — Ambulatory Visit (HOSPITAL_COMMUNITY)
Admission: RE | Admit: 2021-06-16 | Discharge: 2021-06-16 | Disposition: A | Discharge status: Home / Self Care | Payer: Medicare PPO | Source: Ambulatory Visit | Attending: Vascular Surgery | Admitting: Vascular Surgery

## 2021-06-16 ENCOUNTER — Encounter: Payer: Self-pay | Admitting: Vascular Surgery

## 2021-06-16 ENCOUNTER — Ambulatory Visit: Payer: Medicare PPO | Admitting: Vascular Surgery

## 2021-06-16 VITALS — BP 119/76 | HR 78 | Temp 98.0°F | Resp 20 | Ht 70.0 in | Wt 223.0 lb

## 2021-06-16 DIAGNOSIS — I714 Abdominal aortic aneurysm, without rupture, unspecified: Secondary | ICD-10-CM

## 2021-06-16 NOTE — Progress Notes (Signed)
REASON FOR VISIT:   Follow-up of abdominal aortic aneurysm.  MEDICAL ISSUES:   ABDOMINAL AORTIC ANEURYSM: This patient's abdominal aortic aneurysm has not changed significantly in size.  The aneurysm measures 4.4 cm in maximum diameter.  The right common iliac artery measures 1.8 cm, and the left common iliac artery measures 1.4 cm.  He understands we would not consider elective repair unless the aneurysm reached 5.5 cm in maximum diameter and a normal risk patient.  He is not a smoker.  His blood pressure is under good control.  I think it would be reasonable to continue his follow-up at 9 months and I have ordered a follow-up duplex scan at that time.  He knows to call sooner if he has problems.  We did talk about going to the emergency department immediately if he had sudden onset of abdominal pain or back pain.  CT scan would quickly discern whether or not this was related to his aneurysm.  However for aneurysm this size I think the risk of rupture is very small and certainly the risk of surgery outweighs that risk at this point.   HPI:   Jeffrey Stephens is a pleasant 80 y.o. male who I last saw on 09/15/2020 with a 4.3 sonometer infrarenal abdominal aortic aneurysm.  His blood pressure was under good control.  He was not a smoker.  He understood we would not consider elective repair unless the aneurysm reached 5.5 cm in maximum diameter.  I felt it would be reasonable to stretch his follow-up out in 9 months and he comes in for a 50-month follow-up visit.  The patient does have some chronic back pain.  He denies any new onset abdominal pain or back pain.  There have been no significant changes to his medical history.  He denies any chest pain.  He does have some dyspnea on exertion  Past Medical History:  Diagnosis Date   AAA (abdominal aortic aneurysm) ([place])    a. followed by Dr. Deitra Mayo - 01/2016 CT: 3.6 cm infrarenal AAA.   Abnormal nuclear cardiac imaging test 07/13/2014    a. 06/2014 MV: possible reversible inferior wall defect,no wma, nl EF, felt to be artifact.   Basal cell carcinoma    "LLE; some on my head"   BPH (benign prostatic hyperplasia)    Cerebral aneurysm 1994   Constipation    Depression    DVT (deep venous thrombosis) ([place]) 2001   Hx of Left leg    Dysrhythmia    AFIB    Frequency of urination    GERD (gastroesophageal reflux disease)    History of echocardiogram    a. 2D ECHO: 06/11/2014: EF 55-60%. Normal wall thickness. Indeterminate DD ( atrial flutter ). No RWMA. Mild LA dilation. Normal RV size and systolic function. No significant valvular abnormalities.   History of palpitations    OCCASIONAL   Kidney stones    Lung nodule    a. 02/2016 PET scan: intensely hypermetabolic 2.0 x 1.5 cm central RLL nodule (SUV 23).   Paroxysmal atrial flutter ([place])    a. On eliquis and sotalol (CHA2DS2VASc = 1).   Rheumatoid arthritis ([place])    "qwhere"   Stroke [facility]) 1994   "when I had ruptured aneurysm in my head"   TIA (transient ischemic attack)    1994 WITH ANEYRYSM    Family History  Problem Relation Age of Onset   Heart disease Father        Aneurysm  Diabetes Father    Heart attack Father    Heart disease Brother        Heart Disease before age 66   Cancer Brother    Hypertension Neg Hx    Stroke Neg Hx     SOCIAL HISTORY: Social History   Tobacco Use   Smoking status: Former    Packs/day: 2.50    Years: 37.00    Pack years: 92.50    Types: Cigarettes    Quit date: 09/25/1992    Years since quitting: 28.7   Smokeless tobacco: Never  Substance Use Topics   Alcohol use: No    Alcohol/week: 0.0 standard drinks    Allergies  Allergen Reactions   No Known Allergies Other (See Comments)    Current Outpatient Medications  Medication Sig Dispense Refill   acetaminophen (TYLENOL) 325 MG tablet Take 2 tablets (650 mg total) by mouth every 4 (four) hours as needed for headache or mild pain.     albuterol (VENTOLIN  HFA) 108 (90 Base) MCG/ACT inhaler TAKE 2 PUFFS BY MOUTH EVERY 6 HOURS AS NEEDED FOR WHEEZE OR SHORTNESS OF BREATH 8.5 each 6   ELIQUIS 5 MG TABS tablet TAKE 1 TABLET BY MOUTH TWICE A DAY 606 tablet 1   folic acid (FOLVITE) 1 MG tablet Take 1 mg by mouth every morning.      guaiFENesin (MUCINEX) 600 MG 12 hr tablet Take 1 tablet (600 mg total) by mouth daily. 30 tablet 2   lansoprazole (PREVACID) 30 MG capsule Take 30 mg by mouth every morning.     levofloxacin (LEVAQUIN) 250 MG tablet Take 1 tablet (250 mg total) by mouth daily. 7 tablet 0   methotrexate (RHEUMATREX) 5 MG tablet Take 5 mg by mouth once a week. Caution: Chemotherapy. Protect from light.     Multiple Vitamins-Minerals (CENTRUM SILVER ADULT 50+) TABS Take 50 mg by mouth daily.     promethazine-dextromethorphan (PROMETHAZINE-DM) 6.25-15 MG/5ML syrup Take 5 mLs by mouth 3 (three) times daily as needed for cough. 140 mL 0   Tiotropium Bromide-Olodaterol (STIOLTO RESPIMAT) 2.5-2.5 MCG/ACT AERS Inhale 2 puffs into the lungs daily. 4 g 0   Tiotropium Bromide-Olodaterol 2.5-2.5 MCG/ACT AERS Inhale 2 puffs into the lungs daily. 4 g 11   tamsulosin (FLOMAX) 0.4 MG CAPS capsule Take 0.4 mg by mouth daily. (Patient not taking: Reported on 06/16/2021)  11   No current facility-administered medications for this visit.    REVIEW OF SYSTEMS:  [X]  denotes positive finding, [ ]  denotes negative finding Cardiac  Comments:  Chest pain or chest pressure:    Shortness of breath upon exertion: x   Short of breath when lying flat:    Irregular heart rhythm:        Vascular    Pain in calf, thigh, or hip brought on by ambulation:    Pain in feet at night that wakes you up from your sleep:     Blood clot in your veins:    Leg swelling:         Pulmonary    Oxygen at home: x   Productive cough:     Wheezing:  x       Neurologic    Sudden weakness in arms or legs:     Sudden numbness in arms or legs:     Sudden onset of difficulty speaking  or slurred speech:    Temporary loss of vision in one eye:     Problems with dizziness:  x       Gastrointestinal    Blood in stool:     Vomited blood:         Genitourinary    Burning when urinating:     Blood in urine:        Psychiatric    Major depression:         Hematologic    Bleeding problems:    Problems with blood clotting too easily:        Skin    Rashes or ulcers:        Constitutional    Fever or chills:     PHYSICAL EXAM:   Vitals:   06/16/21 0927  BP: 119/76  Pulse: 78  Resp: 20  Temp: 98 F (36.7 C)  SpO2: 93%  Weight: 223 lb (101.2 kg)  Height: 5\' 10"  (1.778 m)    GENERAL: The patient is a well-nourished male, in no acute distress. The vital signs are documented above. CARDIAC: There is a regular rate and rhythm.  VASCULAR: I do not detect carotid bruits. He has palpable femoral and pedal pulses bilaterally. PULMONARY: There is good air exchange bilaterally without wheezing or rales. ABDOMEN: Soft and non-tender with normal pitched bowel sounds.  I cannot palpate his aneurysm. MUSCULOSKELETAL: There are no major deformities or cyanosis. NEUROLOGIC: No focal weakness or paresthesias are detected. SKIN: There are no ulcers or rashes noted. PSYCHIATRIC: The patient has a normal affect.  DATA:    DUPLEX ABDOMINAL AORTA: I have independently interpreted his duplex of his abdominal aorta today.  The maximum diameter of his infrarenal aorta is 4.4 cm.  The right common iliac artery measures 1.8 cm in maximum diameter.  The left common iliac artery measures 1.4 cm in maximum diameter.  Deitra Mayo [facility] [phone]

## 2021-06-20 DIAGNOSIS — D6869 Other thrombophilia: Secondary | ICD-10-CM | POA: Diagnosis not present

## 2021-06-20 DIAGNOSIS — I700 Atherosclerosis of aorta: Secondary | ICD-10-CM | POA: Diagnosis not present

## 2021-06-20 DIAGNOSIS — K219 Gastro-esophageal reflux disease without esophagitis: Secondary | ICD-10-CM | POA: Diagnosis not present

## 2021-06-20 DIAGNOSIS — J439 Emphysema, unspecified: Secondary | ICD-10-CM | POA: Diagnosis not present

## 2021-06-20 DIAGNOSIS — M069 Rheumatoid arthritis, unspecified: Secondary | ICD-10-CM | POA: Diagnosis not present

## 2021-06-20 DIAGNOSIS — J9610 Chronic respiratory failure, unspecified whether with hypoxia or hypercapnia: Secondary | ICD-10-CM | POA: Diagnosis not present

## 2021-06-20 DIAGNOSIS — D649 Anemia, unspecified: Secondary | ICD-10-CM | POA: Diagnosis not present

## 2021-06-20 DIAGNOSIS — E669 Obesity, unspecified: Secondary | ICD-10-CM | POA: Diagnosis not present

## 2021-06-20 DIAGNOSIS — I4891 Unspecified atrial fibrillation: Secondary | ICD-10-CM | POA: Diagnosis not present

## 2021-06-21 ENCOUNTER — Other Ambulatory Visit: Payer: Self-pay

## 2021-06-21 DIAGNOSIS — J449 Chronic obstructive pulmonary disease, unspecified: Secondary | ICD-10-CM | POA: Diagnosis not present

## 2021-06-21 DIAGNOSIS — I714 Abdominal aortic aneurysm, without rupture, unspecified: Secondary | ICD-10-CM

## 2021-07-11 DIAGNOSIS — Z79899 Other long term (current) drug therapy: Secondary | ICD-10-CM | POA: Diagnosis not present

## 2021-07-11 DIAGNOSIS — M150 Primary generalized (osteo)arthritis: Secondary | ICD-10-CM | POA: Diagnosis not present

## 2021-07-11 DIAGNOSIS — Z6833 Body mass index (BMI) 33.0-33.9, adult: Secondary | ICD-10-CM | POA: Diagnosis not present

## 2021-07-11 DIAGNOSIS — M0579 Rheumatoid arthritis with rheumatoid factor of multiple sites without organ or systems involvement: Secondary | ICD-10-CM | POA: Diagnosis not present

## 2021-07-11 DIAGNOSIS — M2550 Pain in unspecified joint: Secondary | ICD-10-CM | POA: Diagnosis not present

## 2021-07-11 DIAGNOSIS — E669 Obesity, unspecified: Secondary | ICD-10-CM | POA: Diagnosis not present

## 2021-07-20 ENCOUNTER — Ambulatory Visit (INDEPENDENT_AMBULATORY_CARE_PROVIDER_SITE_OTHER): Payer: Medicare PPO

## 2021-07-20 ENCOUNTER — Ambulatory Visit: Payer: Medicare PPO | Admitting: Podiatry

## 2021-07-20 ENCOUNTER — Encounter: Payer: Self-pay | Admitting: Podiatry

## 2021-07-20 ENCOUNTER — Other Ambulatory Visit: Payer: Self-pay

## 2021-07-20 DIAGNOSIS — Z9889 Other specified postprocedural states: Secondary | ICD-10-CM | POA: Diagnosis not present

## 2021-07-20 DIAGNOSIS — M2042 Other hammer toe(s) (acquired), left foot: Secondary | ICD-10-CM

## 2021-07-20 NOTE — Progress Notes (Signed)
Subjective:   Patient ID: [name], male   DOB: 80 y.o.   MRN: [id number]   HPI Patient presents concerned about the position of the second toe left foot stating that he feels like it is lifting and coming medial and while its only mildly tender he wants it checked   ROS      Objective:  Physical Exam  Neurovascular status intact negative Bevelyn Buckles' sign noted the second digit has healed very well with no incision present but does have some deviation and especially has structural damage of the hallux and first metatarsal with bunion deformity that were trying to avoid having to do surgery on     Assessment:  Probability that this is related to his overall foot structure but is not having the same symptoms he did preoperatively     Plan:  H&P reviewed condition the fact were trying to avoid having to do extensive surgery on the findings hammertoe and he understands this completely and we will just watch this for the next few months and hopefully it will not be symptomatic which would be a victory in this condition.  I did explain surgery that could be done but were trying to avoid currently and I did apply cushions to the second toe and allow all swelling to finish at this time  X-rays indicate there is moderate elevation second digit left foot with severe structural bunion and deviation of the hallux that is a problem associated with condition

## 2021-07-21 DIAGNOSIS — J449 Chronic obstructive pulmonary disease, unspecified: Secondary | ICD-10-CM | POA: Diagnosis not present

## 2021-07-22 ENCOUNTER — Other Ambulatory Visit: Payer: Self-pay | Admitting: Cardiovascular Disease

## 2021-07-22 NOTE — Telephone Encounter (Signed)
Prescription refill request for Eliquis received. Last office visit:croitoru 09/22/20 Scr:1.230 07/11/2021 Age: 26m Weight:101.2kg

## 2021-08-15 DIAGNOSIS — R0982 Postnasal drip: Secondary | ICD-10-CM | POA: Diagnosis not present

## 2021-08-15 DIAGNOSIS — M545 Low back pain, unspecified: Secondary | ICD-10-CM | POA: Diagnosis not present

## 2021-08-21 DIAGNOSIS — J449 Chronic obstructive pulmonary disease, unspecified: Secondary | ICD-10-CM | POA: Diagnosis not present

## 2021-09-02 DIAGNOSIS — R630 Anorexia: Secondary | ICD-10-CM | POA: Diagnosis not present

## 2021-09-02 DIAGNOSIS — Z03818 Encounter for observation for suspected exposure to other biological agents ruled out: Secondary | ICD-10-CM | POA: Diagnosis not present

## 2021-09-02 DIAGNOSIS — I951 Orthostatic hypotension: Secondary | ICD-10-CM | POA: Diagnosis not present

## 2021-09-02 DIAGNOSIS — R52 Pain, unspecified: Secondary | ICD-10-CM | POA: Diagnosis not present

## 2021-09-02 DIAGNOSIS — R051 Acute cough: Secondary | ICD-10-CM | POA: Diagnosis not present

## 2021-09-20 ENCOUNTER — Other Ambulatory Visit: Payer: Self-pay

## 2021-09-20 ENCOUNTER — Encounter: Payer: Self-pay | Admitting: Emergency Medicine

## 2021-09-20 ENCOUNTER — Ambulatory Visit: Payer: Medicare PPO | Admitting: Emergency Medicine

## 2021-09-20 DIAGNOSIS — C3430 Malignant neoplasm of lower lobe, unspecified bronchus or lung: Secondary | ICD-10-CM | POA: Diagnosis not present

## 2021-09-20 DIAGNOSIS — J9611 Chronic respiratory failure with hypoxia: Secondary | ICD-10-CM

## 2021-09-20 DIAGNOSIS — J449 Chronic obstructive pulmonary disease, unspecified: Secondary | ICD-10-CM | POA: Diagnosis not present

## 2021-09-20 DIAGNOSIS — J432 Centrilobular emphysema: Secondary | ICD-10-CM | POA: Diagnosis not present

## 2021-09-20 MED ORDER — ALBUTEROL SULFATE HFA 108 (90 BASE) MCG/ACT IN AERS
INHALATION_SPRAY | RESPIRATORY_TRACT | 6 refills | Status: AC
Start: 2021-09-20 — End: ?

## 2021-09-20 MED ORDER — STIOLTO RESPIMAT 2.5-2.5 MCG/ACT IN AERS: 2.0000 | INHALATION_SPRAY | Freq: Every day | RESPIRATORY_TRACT | 0 refills | Status: DC

## 2021-09-20 NOTE — Assessment & Plan Note (Signed)
CT chest from June reviewed.  No evidence of recurrence.  Nodules are stable.  He should not need a repeat unless there is a clinical change.

## 2021-09-20 NOTE — Assessment & Plan Note (Signed)
Does not wear his exertional oxygen reliably.  Talked to him about using his POC with exertion to help with his chronic exertional shortness of breath.

## 2021-09-20 NOTE — Assessment & Plan Note (Signed)
Overall stable without any flares.  He has some increased secretions, thick and hard to clear.  We talked about try Mucinex but he is concerned that this could make him dehydrated.  Talked about using Mucinex and drinking a lot of water.  He will think about it.  We will give him Stiolto samples, refill of albuterol today.  Please continue your Stiolto 2 puffs once daily. Keep albuterol available to use 2 puffs if needed for shortness of breath, chest tightness, wheezing.  We will refill this today. Flu shot up-to-date Agree with getting the next COVID booster when you are able Follow with Dr. Lamonte Sakai in 12 months or sooner if you have any problems.

## 2021-09-20 NOTE — Progress Notes (Signed)
Subjective:    Patient ID: Jeffrey Stephens, male    DOB: October 04, 1940, 80 y.o.   MRN: [id number]  HPI 80 year old former smoker (63 pack years) previously followed by Dr. Gwenette Greet for COPD, hx DVT, A fib, RA on MTX, prior cerebral aneurysmal rupture, underwent right lower lobe resection for stage I poorly differentiated adenocarcinoma on 04/2016.  ROV 11/17/20 --this follow-up visit for 20 gentleman with COPD who I saw in August for progressive dyspnea.  He also has a history of right lower lobe resection for stage I adenocarcinoma (2017), DVT, A. fib, RA on methotrexate. At our last visit he started Stiolto to see if he would get benefit.  We also identified exertional desaturation at that visit and started him on oxygen at liters per minute.  Today he reports that he doesn't remember every getting the Stiolto or whether it helped him. He has O2 and a POC, but does not use reliably, sometimes when he gets SOB. He has SOB with walking on flat ground. Unable to vacuum, etc. Some daily cough due to nasal drainage - wants to restart mucinex. Hears wheeze with exertion or rest.   He will need a repeat CT of his chest in June 2021, his 5th year   Repeat pulmonary function testing performed today reviewed by me, shows moderate obstruction without a bronchodilator response, normal volumes and a decreased diffusion capacity.  FEV1 down to 1.98 from 2.21 L in 2017.  ROV 09/20/21 --Jeffrey Stephens a 80, former smoker with a history of COPD, stage I adenocarcinoma of the lung resected in 04/2016.  Also with a history of RA on methotrexate, DVT, A. fib.  Currently managed on Stiolto.  He has been found to desaturate with exertion, has a POC, uses it in response to exertional SOB. He believes that Stiolto is helpful. He has averaged albuterol about once a day. No flares since last time. He coughs daily, productive of greenish mucous, thick and can be hard to clear. Exercise tolerance has declined some.  He has had flu  shot, one covid booster, due for next.   CT chest June/21/22 reviewed by me showed stable bilateral small pulmonary nodules consistent with benign process, no evidence of recurrence.  Review of Systems As per HPI      Objective:   Physical Exam Vitals:   09/20/21 0854  BP: 132/80  Pulse: 79  Temp: 98.2 F (36.8 C)  TempSrc: Oral  SpO2: 94%  Weight: 227 lb (103 kg)  Height: 5\' 10"  (1.778 m)   Gen: Pleasant, obese, in no distress,  normal affect  ENT: No lesions,  mouth clear,  oropharynx clear, no postnasal drip  Neck: No JVD, no stridor  Lungs: No use of accessory muscles, distant, no crackles or wheezing on normal respiration, no wheeze on forced expiration  Cardiovascular: RRR, heart sounds normal, no murmur or gallops, no peripheral edema  Musculoskeletal: No deformities, no cyanosis or clubbing  Neuro: alert, awake, non focal  Skin: Warm, no lesions or rash     Assessment & Plan:  COPD with emphysema (HCC) Overall stable without any flares.  He has some increased secretions, thick and hard to clear.  We talked about try Mucinex but he is concerned that this could make him dehydrated.  Talked about using Mucinex and drinking a lot of water.  He will think about it.  We will give him Stiolto samples, refill of albuterol today.  Please continue your Stiolto 2 puffs once daily. Keep albuterol  available to use 2 puffs if needed for shortness of breath, chest tightness, wheezing.  We will refill this today. Flu shot up-to-date Agree with getting the next COVID booster when you are able Follow with Dr. Lamonte Sakai in 12 months or sooner if you have any problems.   Lung cancer, lower lobe ([place]) CT chest from June reviewed.  No evidence of recurrence.  Nodules are stable.  He should not need a repeat unless there is a clinical change.  Chronic respiratory failure with hypoxia (HCC) Does not wear his exertional oxygen reliably.  Talked to him about using his POC with exertion  to help with his chronic exertional shortness of breath.  Baltazar Apo, MD, PhD 09/20/2021, 9:13 AM [facility] [phone] or if no answer [phone]

## 2021-09-20 NOTE — Patient Instructions (Signed)
Please continue your Stiolto 2 puffs once daily. Keep albuterol available to use 2 puffs if needed for shortness of breath, chest tightness, wheezing.  We will refill this today. You would benefit from wearing your portable oxygen with exertion. Flu shot up-to-date Agree with getting the next COVID booster when you are able We reviewed your CT scan of the chest today and it is stable.  We do not need to repeat unless you have a clinical change.  This is good news. Follow with Dr. Lamonte Sakai in 12 months or sooner if you have any problems.

## 2021-09-30 DIAGNOSIS — R3912 Poor urinary stream: Secondary | ICD-10-CM | POA: Diagnosis not present

## 2021-09-30 DIAGNOSIS — N5201 Erectile dysfunction due to arterial insufficiency: Secondary | ICD-10-CM | POA: Diagnosis not present

## 2021-09-30 DIAGNOSIS — E349 Endocrine disorder, unspecified: Secondary | ICD-10-CM | POA: Diagnosis not present

## 2021-09-30 DIAGNOSIS — N402 Nodular prostate without lower urinary tract symptoms: Secondary | ICD-10-CM | POA: Diagnosis not present

## 2021-09-30 DIAGNOSIS — R948 Abnormal results of function studies of other organs and systems: Secondary | ICD-10-CM | POA: Diagnosis not present

## 2021-09-30 DIAGNOSIS — R3121 Asymptomatic microscopic hematuria: Secondary | ICD-10-CM | POA: Diagnosis not present

## 2021-10-11 DIAGNOSIS — M0579 Rheumatoid arthritis with rheumatoid factor of multiple sites without organ or systems involvement: Secondary | ICD-10-CM | POA: Diagnosis not present

## 2021-10-13 DIAGNOSIS — M8580 Other specified disorders of bone density and structure, unspecified site: Secondary | ICD-10-CM | POA: Diagnosis not present

## 2021-10-13 DIAGNOSIS — Z1389 Encounter for screening for other disorder: Secondary | ICD-10-CM | POA: Diagnosis not present

## 2021-10-13 DIAGNOSIS — I714 Abdominal aortic aneurysm, without rupture, unspecified: Secondary | ICD-10-CM | POA: Diagnosis not present

## 2021-10-13 DIAGNOSIS — J410 Simple chronic bronchitis: Secondary | ICD-10-CM | POA: Diagnosis not present

## 2021-10-13 DIAGNOSIS — M069 Rheumatoid arthritis, unspecified: Secondary | ICD-10-CM | POA: Diagnosis not present

## 2021-10-13 DIAGNOSIS — I4892 Unspecified atrial flutter: Secondary | ICD-10-CM | POA: Diagnosis not present

## 2021-10-13 DIAGNOSIS — K219 Gastro-esophageal reflux disease without esophagitis: Secondary | ICD-10-CM | POA: Diagnosis not present

## 2021-10-13 DIAGNOSIS — N529 Male erectile dysfunction, unspecified: Secondary | ICD-10-CM | POA: Diagnosis not present

## 2021-10-13 DIAGNOSIS — K5900 Constipation, unspecified: Secondary | ICD-10-CM | POA: Diagnosis not present

## 2021-10-13 DIAGNOSIS — Z0000 Encounter for general adult medical examination without abnormal findings: Secondary | ICD-10-CM | POA: Diagnosis not present

## 2021-10-13 DIAGNOSIS — Z85118 Personal history of other malignant neoplasm of bronchus and lung: Secondary | ICD-10-CM | POA: Diagnosis not present

## 2021-10-21 DIAGNOSIS — J449 Chronic obstructive pulmonary disease, unspecified: Secondary | ICD-10-CM | POA: Diagnosis not present

## 2021-11-21 ENCOUNTER — Other Ambulatory Visit: Payer: Self-pay | Admitting: Cardiovascular Disease

## 2021-11-21 DIAGNOSIS — J449 Chronic obstructive pulmonary disease, unspecified: Secondary | ICD-10-CM | POA: Diagnosis not present

## 2021-11-21 NOTE — Telephone Encounter (Signed)
Prescription refill request for Eliquis received. Indication:Aflutter Last office visit:upcoming 3/7 Scr:1.1 Age: 81 Weight:103 kg  Prescription refilled

## 2021-11-29 ENCOUNTER — Other Ambulatory Visit: Payer: Self-pay

## 2021-11-29 ENCOUNTER — Ambulatory Visit: Payer: Medicare PPO | Admitting: Cardiovascular Disease

## 2021-11-29 ENCOUNTER — Encounter: Payer: Self-pay | Admitting: Cardiovascular Disease

## 2021-11-29 VITALS — BP 138/84 | HR 87 | Ht 69.5 in | Wt 228.2 lb

## 2021-11-29 DIAGNOSIS — I483 Typical atrial flutter: Secondary | ICD-10-CM

## 2021-11-29 DIAGNOSIS — I7143 Infrarenal abdominal aortic aneurysm, without rupture: Secondary | ICD-10-CM | POA: Diagnosis not present

## 2021-11-29 DIAGNOSIS — I700 Atherosclerosis of aorta: Secondary | ICD-10-CM

## 2021-11-29 DIAGNOSIS — R0609 Other forms of dyspnea: Secondary | ICD-10-CM

## 2021-11-29 DIAGNOSIS — I2510 Atherosclerotic heart disease of native coronary artery without angina pectoris: Secondary | ICD-10-CM | POA: Diagnosis not present

## 2021-11-29 DIAGNOSIS — D6869 Other thrombophilia: Secondary | ICD-10-CM | POA: Diagnosis not present

## 2021-11-29 NOTE — Patient Instructions (Signed)

## 2021-11-29 NOTE — Progress Notes (Signed)
Patient ID: Jeffrey Stephens, male   DOB: [birth year], 81 y.o.   MRN: [id number]    Cardiology Office Note    Date:  11/29/2021   ID:  Keshun, Berrett May 27, 1941, MRN [id number]  PCP:  Lavone Orn, MD  Cardiologist:   Sanda Klein, MD   Chief Complaint  Patient presents with   Atrial Flutter follow up    History of Present Illness:  Jeffrey Stephens is a 81 y.o. male with a history of recurrent persistent atrial flutter that responded well to sotalol therapy, but medication stopped due to bradycardia and near-syncope. He declined pacemaker implantation and has done well with conservative therapy.  He presents today normal sinus rhythm.  He has not had any cardiovascular events since his last appointment.  His only complaint is dizziness with standing, in a pattern strongly suggestive of orthostatic hypotension.  It most commonly happens first thing in the morning when he gets out of bed.  He has learned to stand still for few seconds before moving.  He has not had any falls related to this.  He denies syncope.  He has not had any random episodes of dizziness at rest, not associated with changes in position.  He did have a fall in his outside workshop when the lounge chair collapsed underneath him.  He did not hit his head.  He did have trouble getting up.  He has not had any bleeding events or focal neurological complaints.He denies dyspnea or chest pain at rest or with usual activity (still gets a little winded when he walks the long driveway over 563 feet to the mailbox and back.).  He has not had orthopnea, PND or lower extremity edema.  He has not been troubled by any palpitations.  He also has a history of remote DVT of the lower extremities.  He has a small mid abdominal aortic aneurysm, most recently measured by duplex ultrasound at a stable diameter of 4.3 cm last May 27, 2001 2, followed by Dr. Scot Dock.  Roughly 6 years ago he underwent wedge resection of a right lower lobe  stage I squamous cell carcinoma with curative intent. The chest CT showed a 4 cm ascending aortic aneurysm, but a follow up CT August 2018 describes the aorta as non-aneurysmal, 3.8 cm. He has a small fusiform AAA, 3.0 cm in 2012, 3.5 cm in May 2018, 3.9 cm in December 2019, 4.3 cm in December 2021.  He initially presented with paroxysmal atrial flutter in August 2015 in the setting of recent left knee surgery. There was evidence of normal left ventricular systolic function by both echo and nuclear scintigraphy, he did not have significant valvular disease in his left atrium was only mildly dilated. He has calcified coronaries on CT of the chest but has never had angina. A nuclear study was interpreted as showing possible inferior wall reversible defect (question combination of diaphragmatic attenuation artifact and excessive intestinal activity). He was hospitalized in November 2015 with atrial flutter with rapid ventricular response and converted on treatment with sotalol. As far as we know he has never had any embolic events. He has a remote history of deep venous thrombosis of the lower extremities, AAA, brain aneurysm complicated by secondary hemorrhagic stroke but treated with successful clipping in the past.  Past Medical History:  Diagnosis Date   AAA (abdominal aortic aneurysm)    a. followed by Dr. Deitra Mayo - 01/2016 CT: 3.6 cm infrarenal AAA.   Abnormal nuclear cardiac imaging test  07/13/2014   a. 06/2014 MV: possible reversible inferior wall defect,no wma, nl EF, felt to be artifact.   Basal cell carcinoma    "LLE; some on my head"   BPH (benign prostatic hyperplasia)    Cerebral aneurysm 1994   Constipation    Depression    DVT (deep venous thrombosis) ([place]) 2001   Hx of Left leg    Dysrhythmia    AFIB    Frequency of urination    GERD (gastroesophageal reflux disease)    History of echocardiogram    a. 2D ECHO: 06/11/2014: EF 55-60%. Normal wall thickness. Indeterminate  DD ( atrial flutter ). No RWMA. Mild LA dilation. Normal RV size and systolic function. No significant valvular abnormalities.   History of palpitations    OCCASIONAL   Kidney stones    Lung nodule    a. 02/2016 PET scan: intensely hypermetabolic 2.0 x 1.5 cm central RLL nodule (SUV 23).   Paroxysmal atrial flutter ([place])    a. On eliquis and sotalol (CHA2DS2VASc = 1).   Rheumatoid arthritis ([place])    "qwhere"   Stroke ([place]) 1994   "when I had ruptured aneurysm in my head"   TIA (transient ischemic attack)    1994 WITH ANEYRYSM    Past Surgical History:  Procedure Laterality Date   BASAL CELL CARCINOMA EXCISION     "LLE; top of my head"   CEREBRAL ANEURYSM REPAIR  1994   Hx of ruptured brain aneurysm Tx by Dr. Ellene Route   CYSTOSCOPY W/ STONE MANIPULATION  1980's X 1   EXCISIONAL HEMORRHOIDECTOMY  1970's   JOINT REPLACEMENT     LT KNEE   TOTAL KNEE ARTHROPLASTY Left 05/14/2014   Procedure: LEFT TOTAL KNEE ARTHROPLASTY;  Surgeon: Johnn Hai, MD;  Location: [facility];  Service: Orthopedics;  Laterality: Left;   VIDEO ASSISTED THORACOSCOPY (VATS)/WEDGE RESECTION Right 05/08/2016   Procedure: VIDEO ASSISTED THORACOSCOPY (VATS)/LUNG RESECTION;  Surgeon: Ivin Poot, MD;  Location: [facility];  Service: Thoracic;  Laterality: Right;    Current Medications: Outpatient Medications Prior to Visit  Medication Sig Dispense Refill   acetaminophen (TYLENOL) 325 MG tablet Take 2 tablets (650 mg total) by mouth every 4 (four) hours as needed for headache or mild pain.     albuterol (VENTOLIN HFA) 108 (90 Base) MCG/ACT inhaler TAKE 2 PUFFS BY MOUTH EVERY 6 HOURS AS NEEDED FOR WHEEZE OR SHORTNESS OF BREATH 8.5 g 6   ELIQUIS 5 MG TABS tablet TAKE 1 TABLET BY MOUTH TWICE A DAY 010 tablet 1   folic acid (FOLVITE) 1 MG tablet Take 1 mg by mouth every morning.      lansoprazole (PREVACID) 30 MG capsule Take 30 mg by mouth every morning.     methotrexate (RHEUMATREX) 5 MG tablet Take 5 mg by mouth once a  week. Caution: Chemotherapy. Protect from light.     Multiple Vitamins-Minerals (CENTRUM SILVER ADULT 50+) TABS Take 50 mg by mouth daily.     Tiotropium Bromide-Olodaterol 2.5-2.5 MCG/ACT AERS Inhale 2 puffs into the lungs daily. 4 g 11   Tiotropium Bromide-Olodaterol (STIOLTO RESPIMAT) 2.5-2.5 MCG/ACT AERS Inhale 2 puffs into the lungs daily. 4 g 0   No facility-administered medications prior to visit.     Allergies:   No known allergies   Social History   Socioeconomic History   Marital status: Married    Spouse name: Not on file   Number of children: Not on file   Years of education: Not  on file   Highest education level: Not on file  Occupational History   Not on file  Tobacco Use   Smoking status: Former    Packs/day: 2.50    Years: 37.00    Pack years: 92.50    Types: Cigarettes    Quit date: 09/25/1992    Years since quitting: 29.1   Smokeless tobacco: Never  Vaping Use   Vaping Use: Never used  Substance and Sexual Activity   Alcohol use: No    Alcohol/week: 0.0 standard drinks   Drug use: No   Sexual activity: Not Currently  Other Topics Concern   Not on file  Social History Narrative   Not on file   Social Determinants of Health   Financial Resource Strain: Not on file  Food Insecurity: Not on file  Transportation Needs: Not on file  Physical Activity: Not on file  Stress: Not on file  Social Connections: Not on file     Family History:  The patient's family history includes Cancer in his brother; Diabetes in his father; Heart attack in his father; Heart disease in his brother and father.   ROS:   Please see the history of present illness.    ROS All other systems are reviewed and are negative.  PHYSICAL EXAM:   VS:  BP 138/84    Pulse 87    Ht 5' 9.5" (1.765 m)    Wt 228 lb 3.2 oz (103.5 kg)    SpO2 98%    BMI 33.22 kg/m      General: Alert, oriented x3, no distress, moderately obese Head: no evidence of trauma, PERRL, EOMI, no exophtalmos or  lid lag, no myxedema, no xanthelasma; normal ears, nose and oropharynx Neck: normal jugular venous pulsations and no hepatojugular reflux; brisk carotid pulses without delay and no carotid bruits Chest: clear to auscultation, no signs of consolidation by percussion or palpation, normal fremitus, symmetrical and full respiratory excursions Cardiovascular: normal position and quality of the apical impulse, regular rhythm, normal first and second heart sounds, no murmurs, rubs or gallops Abdomen: no tenderness or distention, no masses by palpation, no abnormal pulsatility or arterial bruits, normal bowel sounds, no hepatosplenomegaly Extremities: no clubbing, cyanosis or edema; 2+ radial, ulnar and brachial pulses bilaterally; 2+ right femoral, posterior tibial and dorsalis pedis pulses; 2+ left femoral, posterior tibial and dorsalis pedis pulses; no subclavian or femoral bruits Neurological: grossly nonfocal Psych: Normal mood and affect    Wt Readings from Last 3 Encounters:  11/29/21 228 lb 3.2 oz (103.5 kg)  09/20/21 227 lb (103 kg)  06/16/21 223 lb (101.2 kg)      Studies/Labs Reviewed:   EKG:  EKG is ordered today. NSR, normal ECG Lipid Panel    Component Value Date/Time   CHOL 95 06/12/2014 0050   TRIG 80 06/12/2014 0050   HDL 32 (L) 06/12/2014 0050   CHOLHDL 3.0 06/12/2014 0050   VLDL 16 06/12/2014 0050   LDLCALC 47 06/12/2014 0050    09/30/2018 total cholesterol 122, HDL 36, LDL 62, triglycerides 120  10/11/2021 Hemoglobin 14.8, creatinine 1.17, ALT 8 ASSESSMENT:    1. Typical atrial flutter ([place])   2. Acquired thrombophilia ([place])   3. Coronary artery calcification seen on CT scan   4. Infrarenal abdominal aortic aneurysm (AAA) without rupture   5. Atherosclerosis of aorta ([place])   6. Dyspnea on exertion      PLAN:  In order of problems listed above:  AFlutter: He has not been  aware of palpitations.  The arrhythmia responded well to sotalol, but this medication  caused excessive bradycardia.  He wants to avoid pacemaker implantation.  Currently not on any active antiarrhythmic therapy.  Asked him to report any episodes of unprovoked dizziness, not associated with change in position.  If these occur, he should undergo some type of arrhythmia monitoring, maybe even implantation of a loop recorder. Eliquis: CHADS Vasc 3 (age 9, vascular disease).  Anticoagulation is indicated for both history of DVT and atrial flutter.  Denies any bleeding complications. Coronary Ca: Denies exertional angina and has a history of a remote normal nuclear stress test 2015. AAA: Followed by Dr. Scot Dock and measured 4.3 cm on the most recent follow-up study, unchanged from the year before. Ao atherosclerosis: LDL cholesterol has been quite low.  4 cm ascending aneurysm reported on CT 05/2016 during f/u for lung neoplasm, but not confirmed and measured at 3.8 cm on follow up CT in 04/2017 and also not mentioned on the last CT in July 2019.  LDL cholesterol was 47 in 2015. S/P lung Ca surgery: Likely the major cause of his exertional dyspnea, via combination of obstructive and restrictive lung disease.  He has a 40-year history of 1 pack/day smoking, but quit in 1994.  Pulmonary function test presurgery showed an FEV1 of 80% of predicted (2.2 L/second).  Symptoms have been stable.  Weight loss would be beneficial. Orthostatic hypotension: He is not on any diuretics or antihypertensive medications or anything that really should be causing this.  Encouraged him to make sure he staying well-hydrated.  Medication Adjustments/Labs and Tests Ordered: Current medicines are reviewed at length with the patient today.  Concerns regarding medicines are outlined above.  Medication changes, Labs and Tests ordered today are listed in the Patient Instructions below. Patient Instructions  Medication Instructions:  No changes *If you need a refill on your cardiac medications before your next appointment,  please call your pharmacy*   Lab Work: None ordered If you have labs (blood work) drawn today and your tests are completely normal, you will receive your results only by: [place] (if you have MyChart) OR A paper copy in the mail If you have any lab test that is abnormal or we need to change your treatment, we will call you to review the results.   Testing/Procedures: None ordered   Follow-Up: At [facility], you and your health needs are our priority.  As part of our continuing mission to provide you with exceptional heart care, we have created designated Provider Care Teams.  These Care Teams include your primary Cardiologist (physician) and Advanced Practice Providers (APPs -  Physician Assistants and Nurse Practitioners) who all work together to provide you with the care you need, when you need it.  We recommend signing up for the patient portal called "MyChart".  Sign up information is provided on this After Visit Summary.  MyChart is used to connect with patients for Virtual Visits (Telemedicine).  Patients are able to view lab/test results, encounter notes, upcoming appointments, etc.  Non-urgent messages can be sent to your provider as well.   To learn more about what you can do with MyChart, go to [email or web address].    Your next appointment:   12 month(s)  The format for your next appointment:   In Person  Provider:   Sanda Klein, MD {    Signed, Sanda Klein, MD  11/29/2021 10:11 AM    [address],  [place], [place]  [address] Phone: [phone]; Fax: [phone]

## 2021-12-19 DIAGNOSIS — R3121 Asymptomatic microscopic hematuria: Secondary | ICD-10-CM | POA: Diagnosis not present

## 2021-12-19 DIAGNOSIS — J449 Chronic obstructive pulmonary disease, unspecified: Secondary | ICD-10-CM | POA: Diagnosis not present

## 2021-12-19 DIAGNOSIS — I700 Atherosclerosis of aorta: Secondary | ICD-10-CM | POA: Diagnosis not present

## 2021-12-19 DIAGNOSIS — R319 Hematuria, unspecified: Secondary | ICD-10-CM | POA: Diagnosis not present

## 2022-01-01 ENCOUNTER — Inpatient Hospital Stay (HOSPITAL_COMMUNITY)
Admission: EM | Admit: 2022-01-01 | Discharge: 2022-01-08 | DRG: 481 | Disposition: A | Discharge status: Home Health | Payer: Medicare PPO | Attending: Internal Medicine | Admitting: Internal Medicine

## 2022-01-01 DIAGNOSIS — I714 Abdominal aortic aneurysm, without rupture, unspecified: Secondary | ICD-10-CM | POA: Diagnosis present

## 2022-01-01 DIAGNOSIS — F32A Depression, unspecified: Secondary | ICD-10-CM | POA: Diagnosis present

## 2022-01-01 DIAGNOSIS — Z79899 Other long term (current) drug therapy: Secondary | ICD-10-CM

## 2022-01-01 DIAGNOSIS — S72001A Fracture of unspecified part of neck of right femur, initial encounter for closed fracture: Secondary | ICD-10-CM

## 2022-01-01 DIAGNOSIS — R319 Hematuria, unspecified: Secondary | ICD-10-CM | POA: Diagnosis present

## 2022-01-01 DIAGNOSIS — Z8673 Personal history of transient ischemic attack (TIA), and cerebral infarction without residual deficits: Secondary | ICD-10-CM

## 2022-01-01 DIAGNOSIS — M1711 Unilateral primary osteoarthritis, right knee: Secondary | ICD-10-CM | POA: Diagnosis not present

## 2022-01-01 DIAGNOSIS — Z96652 Presence of left artificial knee joint: Secondary | ICD-10-CM | POA: Diagnosis present

## 2022-01-01 DIAGNOSIS — D72823 Leukemoid reaction: Secondary | ICD-10-CM | POA: Diagnosis not present

## 2022-01-01 DIAGNOSIS — D649 Anemia, unspecified: Secondary | ICD-10-CM | POA: Diagnosis not present

## 2022-01-01 DIAGNOSIS — Z8249 Family history of ischemic heart disease and other diseases of the circulatory system: Secondary | ICD-10-CM

## 2022-01-01 DIAGNOSIS — S299XXA Unspecified injury of thorax, initial encounter: Secondary | ICD-10-CM | POA: Diagnosis not present

## 2022-01-01 DIAGNOSIS — W19XXXA Unspecified fall, initial encounter: Secondary | ICD-10-CM | POA: Diagnosis not present

## 2022-01-01 DIAGNOSIS — Z85828 Personal history of other malignant neoplasm of skin: Secondary | ICD-10-CM

## 2022-01-01 DIAGNOSIS — Z9981 Dependence on supplemental oxygen: Secondary | ICD-10-CM

## 2022-01-01 DIAGNOSIS — R9431 Abnormal electrocardiogram [ECG] [EKG]: Secondary | ICD-10-CM | POA: Diagnosis not present

## 2022-01-01 DIAGNOSIS — S72009A Fracture of unspecified part of neck of unspecified femur, initial encounter for closed fracture: Secondary | ICD-10-CM

## 2022-01-01 DIAGNOSIS — J9611 Chronic respiratory failure with hypoxia: Secondary | ICD-10-CM | POA: Diagnosis present

## 2022-01-01 DIAGNOSIS — Z7901 Long term (current) use of anticoagulants: Secondary | ICD-10-CM

## 2022-01-01 DIAGNOSIS — M25461 Effusion, right knee: Secondary | ICD-10-CM | POA: Diagnosis not present

## 2022-01-01 DIAGNOSIS — K219 Gastro-esophageal reflux disease without esophagitis: Secondary | ICD-10-CM | POA: Diagnosis present

## 2022-01-01 DIAGNOSIS — N400 Enlarged prostate without lower urinary tract symptoms: Secondary | ICD-10-CM | POA: Diagnosis present

## 2022-01-01 DIAGNOSIS — Z20822 Contact with and (suspected) exposure to covid-19: Secondary | ICD-10-CM | POA: Diagnosis present

## 2022-01-01 DIAGNOSIS — K567 Ileus, unspecified: Secondary | ICD-10-CM | POA: Diagnosis not present

## 2022-01-01 DIAGNOSIS — D72829 Elevated white blood cell count, unspecified: Secondary | ICD-10-CM

## 2022-01-01 DIAGNOSIS — Z85118 Personal history of other malignant neoplasm of bronchus and lung: Secondary | ICD-10-CM

## 2022-01-01 DIAGNOSIS — Z01818 Encounter for other preprocedural examination: Secondary | ICD-10-CM | POA: Diagnosis not present

## 2022-01-01 DIAGNOSIS — S8991XA Unspecified injury of right lower leg, initial encounter: Secondary | ICD-10-CM | POA: Diagnosis not present

## 2022-01-01 DIAGNOSIS — R0902 Hypoxemia: Secondary | ICD-10-CM | POA: Diagnosis not present

## 2022-01-01 DIAGNOSIS — M25561 Pain in right knee: Secondary | ICD-10-CM | POA: Diagnosis not present

## 2022-01-01 DIAGNOSIS — N1831 Chronic kidney disease, stage 3a: Secondary | ICD-10-CM | POA: Diagnosis present

## 2022-01-01 DIAGNOSIS — Z87442 Personal history of urinary calculi: Secondary | ICD-10-CM

## 2022-01-01 DIAGNOSIS — S7221XA Displaced subtrochanteric fracture of right femur, initial encounter for closed fracture: Secondary | ICD-10-CM | POA: Diagnosis not present

## 2022-01-01 DIAGNOSIS — S72101A Unspecified trochanteric fracture of right femur, initial encounter for closed fracture: Secondary | ICD-10-CM | POA: Diagnosis not present

## 2022-01-01 DIAGNOSIS — I4892 Unspecified atrial flutter: Secondary | ICD-10-CM | POA: Diagnosis present

## 2022-01-01 DIAGNOSIS — R000 Tachycardia, unspecified: Secondary | ICD-10-CM | POA: Diagnosis not present

## 2022-01-01 DIAGNOSIS — Y92009 Unspecified place in unspecified non-institutional (private) residence as the place of occurrence of the external cause: Secondary | ICD-10-CM

## 2022-01-01 DIAGNOSIS — Z902 Acquired absence of lung [part of]: Secondary | ICD-10-CM

## 2022-01-01 DIAGNOSIS — D62 Acute posthemorrhagic anemia: Secondary | ICD-10-CM | POA: Diagnosis not present

## 2022-01-01 DIAGNOSIS — M47812 Spondylosis without myelopathy or radiculopathy, cervical region: Secondary | ICD-10-CM | POA: Diagnosis not present

## 2022-01-01 DIAGNOSIS — R110 Nausea: Secondary | ICD-10-CM | POA: Diagnosis not present

## 2022-01-01 DIAGNOSIS — J439 Emphysema, unspecified: Secondary | ICD-10-CM | POA: Diagnosis present

## 2022-01-01 DIAGNOSIS — S72141A Displaced intertrochanteric fracture of right femur, initial encounter for closed fracture: Principal | ICD-10-CM | POA: Diagnosis present

## 2022-01-01 DIAGNOSIS — Z87891 Personal history of nicotine dependence: Secondary | ICD-10-CM

## 2022-01-01 DIAGNOSIS — W010XXA Fall on same level from slipping, tripping and stumbling without subsequent striking against object, initial encounter: Secondary | ICD-10-CM | POA: Diagnosis present

## 2022-01-01 DIAGNOSIS — Z86718 Personal history of other venous thrombosis and embolism: Secondary | ICD-10-CM

## 2022-01-01 DIAGNOSIS — M069 Rheumatoid arthritis, unspecified: Secondary | ICD-10-CM | POA: Diagnosis present

## 2022-01-01 DIAGNOSIS — I700 Atherosclerosis of aorta: Secondary | ICD-10-CM | POA: Diagnosis present

## 2022-01-01 DIAGNOSIS — N179 Acute kidney failure, unspecified: Secondary | ICD-10-CM | POA: Diagnosis not present

## 2022-01-01 DIAGNOSIS — N289 Disorder of kidney and ureter, unspecified: Secondary | ICD-10-CM | POA: Diagnosis not present

## 2022-01-01 DIAGNOSIS — I6782 Cerebral ischemia: Secondary | ICD-10-CM | POA: Diagnosis not present

## 2022-01-01 DIAGNOSIS — Z043 Encounter for examination and observation following other accident: Secondary | ICD-10-CM | POA: Diagnosis not present

## 2022-01-01 DIAGNOSIS — J431 Panlobular emphysema: Secondary | ICD-10-CM | POA: Diagnosis not present

## 2022-01-01 DIAGNOSIS — I4891 Unspecified atrial fibrillation: Secondary | ICD-10-CM | POA: Diagnosis not present

## 2022-01-01 DIAGNOSIS — J449 Chronic obstructive pulmonary disease, unspecified: Secondary | ICD-10-CM | POA: Diagnosis not present

## 2022-01-01 DIAGNOSIS — G319 Degenerative disease of nervous system, unspecified: Secondary | ICD-10-CM | POA: Diagnosis not present

## 2022-01-01 DIAGNOSIS — J432 Centrilobular emphysema: Secondary | ICD-10-CM | POA: Diagnosis not present

## 2022-01-01 MED ORDER — HYDROMORPHONE HCL 1 MG/ML IJ SOLN
1.0000 mg | Freq: Once | INTRAMUSCULAR | Status: AC
  Administered 2022-01-02: 1 mg via INTRAVENOUS
  Filled 2022-01-01: qty 1

## 2022-01-01 MED ORDER — SODIUM CHLORIDE 0.9 % IV BOLUS
1000.0000 mL | Freq: Once | INTRAVENOUS | Status: AC
  Administered 2022-01-02: 1000 mL via INTRAVENOUS

## 2022-01-01 NOTE — ED Triage Notes (Signed)
Pt BIB EMS, pt had a witnessed mechanical fall. Pt has obvious R hip deformity, + for eliquis.  ?

## 2022-01-01 NOTE — ED Provider Notes (Signed)
?[facility] ?Provider Note ? ?CSN: [id number] ?Arrival date & time: 01/01/22 2316 ? ?Chief Complaint(s) ?Fall ? ?HPI ?Jeffrey Stephens is a 81 y.o. male with a past medical history listed below including A-fib on Eliquis who presents to the emergency department after a fall at home.  Patient ports that he lost his footing causing him to fall onto his right side.  Felt immediate right hip pain that radiates down to the right knee.  He denies any head trauma or loss of consciousness.  Denies any current headache, neck pain, back pain, chest pain, abdominal pain.  No other extremity pain.  No other physical complaints. ? ?The history is provided by the patient.  ? ?Past Medical History ?Past Medical History:  ?Diagnosis Date  ? AAA (abdominal aortic aneurysm)   ? a. followed by Dr. Deitra Mayo - 01/2016 CT: 3.6 cm infrarenal AAA.  ? Abnormal nuclear cardiac imaging test 07/13/2014  ? a. 06/2014 MV: possible reversible inferior wall defect,no wma, nl EF, felt to be artifact.  ? Basal cell carcinoma   ? "LLE; some on my head"  ? BPH (benign prostatic hyperplasia)   ? Cerebral aneurysm 1994  ? Constipation   ? Depression   ? DVT (deep venous thrombosis) ([place]) 2001  ? Hx of Left leg   ? Dysrhythmia   ? AFIB   ? Frequency of urination   ? GERD (gastroesophageal reflux disease)   ? History of echocardiogram   ? a. 2D ECHO: 06/11/2014: EF 55-60%. Normal wall thickness. Indeterminate DD ( atrial flutter ). No RWMA. Mild LA dilation. Normal RV size and systolic function. No significant valvular abnormalities.  ? History of palpitations   ? OCCASIONAL  ? Kidney stones   ? Lung nodule   ? a. 02/2016 PET scan: intensely hypermetabolic 2.0 x 1.5 cm central RLL nodule (SUV 23).  ? Paroxysmal atrial flutter ([place])   ? a. On eliquis and sotalol (CHA2DS2VASc = 1).  ? Rheumatoid arthritis ([place])   ? "qwhere"  ? Stroke [facility]) 1994  ? "when I had ruptured aneurysm in my head"  ? TIA (transient  ischemic attack)   ? [place]  ? ?Patient Active Problem List  ? Diagnosis Date Noted  ? Hip fracture ([place]) 01/02/2022  ? Chronic respiratory failure with hypoxia ([place]) 03/22/2021  ? COPD with emphysema ([place]) 03/03/2020  ? Aortic atherosclerosis ([place]) 08/30/2018  ? Long term current use of anticoagulant 01/18/2017  ? Lung cancer, lower lobe ([place]) 05/08/2016  ? Anticoagulation adequate 08/14/2014  ? Atrial flutter, paroxysmal ([place]) 08/12/2014  ? Atrial flutter ([place]) 08/12/2014  ? Abnormal nuclear cardiac imaging test 07/13/2014  ? Coronary artery calcification seen on CT scan 06/17/2014  ? Atrial flutter with rapid ventricular response, converted with sotalol 06/11/2014  ? Left knee DJD 05/14/2014  ? Abdominal aortic aneurysm ([place]) 07/31/2008  ? Pulmonary nodules 07/31/2008  ? ARTHRITIS, RHEUMATOID 07/31/2008  ? SKIN CANCER, HX OF 07/31/2008  ? NEPHROLITHIASIS, HX OF 07/31/2008  ? BENIGN PROSTATIC HYPERTROPHY, HX OF 07/31/2008  ? ?Home Medication(s) ?Prior to Admission medications   ?Medication Sig Start Date End Date Taking? Authorizing Provider  ?acetaminophen (TYLENOL) 325 MG tablet Take 2 tablets (650 mg total) by mouth every 4 (four) hours as needed for headache or mild pain. 08/14/14   [name], NP  ?albuterol (VENTOLIN HFA) 108 (90 Base) MCG/ACT inhaler TAKE 2 PUFFS BY MOUTH EVERY 6 HOURS AS NEEDED FOR WHEEZE OR SHORTNESS  OF BREATH 09/20/21   Collene Gobble, MD  ?ELIQUIS 5 MG TABS tablet TAKE 1 TABLET BY MOUTH TWICE A DAY 11/21/21   Croitoru, Mihai, MD  ?folic acid (FOLVITE) 1 MG tablet Take 1 mg by mouth every morning.     [provider]  ?lansoprazole (PREVACID) 30 MG capsule Take 30 mg by mouth every morning.    [provider]  ?methotrexate (RHEUMATREX) 5 MG tablet Take 5 mg by mouth once a week. Caution: Chemotherapy. Protect from light.    [provider]  ?Multiple Vitamins-Minerals (CENTRUM SILVER ADULT 50+) TABS Take 50 mg by mouth daily.    [provider]  ?Tiotropium Bromide-Olodaterol 2.5-2.5 MCG/ACT AERS Inhale 2 puffs into the lungs daily. 03/22/21   Martyn Ehrich, NP  ?                                                                                                                                  ?Allergies ?No known allergies ? ?Review of Systems ?Review of Systems ?As noted in HPI ? ?Physical Exam ?Vital Signs  ?I have reviewed the triage vital signs ?BP 126/69   Pulse 93   Temp (!) 97.3 ?F (36.3 ?C) (Oral)   Resp 13   SpO2 96%  ? ?Physical Exam ?Constitutional:   ?   General: He is not in acute distress. ?   Appearance: He is well-developed. He is not diaphoretic.  ?HENT:  ?   Head: Normocephalic.  ?   Right Ear: External ear normal.  ?   Left Ear: External ear normal.  ?Eyes:  ?   General: No scleral icterus.    ?   Right eye: No discharge.     ?   Left eye: No discharge.  ?   Conjunctiva/sclera: Conjunctivae normal.  ?   Pupils: Pupils are equal, round, and reactive to light.  ?Cardiovascular:  ?   Rate and Rhythm: Regular rhythm.  ?   Pulses:     ?     Radial pulses are 2+ on the right side and 2+ on the left side.  ?     Dorsalis pedis pulses are 2+ on the right side and 2+ on the left side.  ?   Heart sounds: Normal heart sounds. No murmur heard. ?  No friction rub. No gallop.  ?Pulmonary:  ?   Effort: Pulmonary effort is normal. No respiratory distress.  ?   Breath sounds: Normal breath sounds. No stridor.  ?Abdominal:  ?   General: There is no distension.  ?   Palpations: Abdomen is soft.  ?   Tenderness: There is no abdominal tenderness.  ?Musculoskeletal:  ?   Cervical back: Normal range of motion and neck supple. No bony tenderness.  ?   Thoracic back: No bony tenderness.  ?   Lumbar back: No bony tenderness.  ?   Right hip: Deformity, tenderness and  bony tenderness present. Decreased range of motion.  ?   Right upper leg: Tenderness and bony tenderness present.  ?   Right knee: Tenderness present over the medial joint line.   ?   Comments: Clavicle stable. ?Chest stable to AP/Lat compression. ?Pelvis stable to Lat compression. ?No obvious extremity deformity. ?No chest or abdominal wall contusion.  ?Skin: ?   General: Skin is warm.  ?Neurological:  ?   Mental Status: He is alert and oriented to person, place, and time.  ?   GCS: GCS eye subscore is 4. GCS verbal subscore is 5. GCS motor subscore is 6.  ?   Comments: Moving all extremities ?  ? ? ?ED Results and Treatments ?Labs ?(all labs ordered are listed, but only abnormal results are displayed) ?Labs Reviewed  ?CBC WITH DIFFERENTIAL/PLATELET - Abnormal; Notable for the following components:  ?    Result Value  ? WBC 15.2 (*)   ? Hemoglobin 12.8 (*)   ? RDW 16.5 (*)   ? Neutro Abs 13.1 (*)   ? Abs Immature Granulocytes 0.09 (*)   ? All other components within normal limits  ?COMPREHENSIVE METABOLIC PANEL - Abnormal; Notable for the following components:  ? Glucose, Bld 160 (*)   ? Creatinine, Ser 1.25 (*)   ? Calcium 8.8 (*)   ? Albumin 3.4 (*)   ? GFR, Estimated 59 (*)   ? All other components within normal limits  ?                                                                                                                       ?EKG ? EKG Interpretation ? ?Date/Time:  Monday January 02 2022 02:38:26 EDT ?Ventricular Rate:  97 ?PR Interval:  190 ?QRS Duration: 91 ?QT Interval:  350 ?QTC Calculation: 445 ?R Axis:   95 ?Text Interpretation: Sinus rhythm Consider right ventricular hypertrophy Baseline wander in lead(s) V3 V4 Confirmed by Addison Lank [phone]) on 01/02/2022 2:39:50 AM ?  ? ?  ? ?Radiology ?DG Chest 1 View ? ?Result Date: 01/02/2022 ?CLINICAL DATA:  Fall injury with right hip fracture, preoperative chest evaluation, right knee pain. EXAM: DG HIP (WITH OR WITHOUT PELVIS) 2-3V RIGHT; RIGHT KNEE - COMPLETE 4+ VIEW; CHEST 1 VIEW COMPARISON:  PA and lateral chest 06/07/2016, CT abdomen and pelvis and reconstructions 12/19/2021. No prior available right knee series. FINDINGS:  Chest: A single supine AP chest film was obtained at 1:20 a.m., 01/02/2022. There is tangle of overlying monitor wires. There is aortic atherosclerosis with stable mediastinum normal cardiac size. The right lateral

## 2022-01-02 ENCOUNTER — Emergency Department (HOSPITAL_COMMUNITY): Payer: Medicare PPO

## 2022-01-02 ENCOUNTER — Encounter (HOSPITAL_COMMUNITY): Payer: Self-pay | Admitting: Internal Medicine

## 2022-01-02 ENCOUNTER — Other Ambulatory Visit: Payer: Self-pay

## 2022-01-02 DIAGNOSIS — S72001A Fracture of unspecified part of neck of right femur, initial encounter for closed fracture: Secondary | ICD-10-CM | POA: Diagnosis not present

## 2022-01-02 DIAGNOSIS — I4892 Unspecified atrial flutter: Secondary | ICD-10-CM

## 2022-01-02 DIAGNOSIS — D72823 Leukemoid reaction: Secondary | ICD-10-CM | POA: Diagnosis not present

## 2022-01-02 DIAGNOSIS — K567 Ileus, unspecified: Secondary | ICD-10-CM | POA: Diagnosis not present

## 2022-01-02 DIAGNOSIS — J449 Chronic obstructive pulmonary disease, unspecified: Secondary | ICD-10-CM | POA: Diagnosis not present

## 2022-01-02 DIAGNOSIS — Z7901 Long term (current) use of anticoagulants: Secondary | ICD-10-CM | POA: Diagnosis not present

## 2022-01-02 DIAGNOSIS — S72009A Fracture of unspecified part of neck of unspecified femur, initial encounter for closed fracture: Secondary | ICD-10-CM | POA: Diagnosis present

## 2022-01-02 DIAGNOSIS — Z20822 Contact with and (suspected) exposure to covid-19: Secondary | ICD-10-CM | POA: Diagnosis present

## 2022-01-02 DIAGNOSIS — M069 Rheumatoid arthritis, unspecified: Secondary | ICD-10-CM | POA: Diagnosis present

## 2022-01-02 DIAGNOSIS — Z96652 Presence of left artificial knee joint: Secondary | ICD-10-CM | POA: Diagnosis present

## 2022-01-02 DIAGNOSIS — Z86718 Personal history of other venous thrombosis and embolism: Secondary | ICD-10-CM | POA: Diagnosis not present

## 2022-01-02 DIAGNOSIS — I714 Abdominal aortic aneurysm, without rupture, unspecified: Secondary | ICD-10-CM | POA: Diagnosis present

## 2022-01-02 DIAGNOSIS — I4891 Unspecified atrial fibrillation: Secondary | ICD-10-CM | POA: Diagnosis not present

## 2022-01-02 DIAGNOSIS — Z79899 Other long term (current) drug therapy: Secondary | ICD-10-CM | POA: Diagnosis not present

## 2022-01-02 DIAGNOSIS — M1711 Unilateral primary osteoarthritis, right knee: Secondary | ICD-10-CM | POA: Diagnosis not present

## 2022-01-02 DIAGNOSIS — D72829 Elevated white blood cell count, unspecified: Secondary | ICD-10-CM

## 2022-01-02 DIAGNOSIS — Z85828 Personal history of other malignant neoplasm of skin: Secondary | ICD-10-CM | POA: Diagnosis not present

## 2022-01-02 DIAGNOSIS — Z9981 Dependence on supplemental oxygen: Secondary | ICD-10-CM | POA: Diagnosis not present

## 2022-01-02 DIAGNOSIS — D62 Acute posthemorrhagic anemia: Secondary | ICD-10-CM | POA: Diagnosis not present

## 2022-01-02 DIAGNOSIS — N400 Enlarged prostate without lower urinary tract symptoms: Secondary | ICD-10-CM | POA: Diagnosis present

## 2022-01-02 DIAGNOSIS — N179 Acute kidney failure, unspecified: Secondary | ICD-10-CM | POA: Diagnosis not present

## 2022-01-02 DIAGNOSIS — W010XXA Fall on same level from slipping, tripping and stumbling without subsequent striking against object, initial encounter: Secondary | ICD-10-CM | POA: Diagnosis present

## 2022-01-02 DIAGNOSIS — R319 Hematuria, unspecified: Secondary | ICD-10-CM | POA: Diagnosis present

## 2022-01-02 DIAGNOSIS — M25461 Effusion, right knee: Secondary | ICD-10-CM | POA: Diagnosis not present

## 2022-01-02 DIAGNOSIS — F32A Depression, unspecified: Secondary | ICD-10-CM | POA: Diagnosis present

## 2022-01-02 DIAGNOSIS — D649 Anemia, unspecified: Secondary | ICD-10-CM

## 2022-01-02 DIAGNOSIS — Y92009 Unspecified place in unspecified non-institutional (private) residence as the place of occurrence of the external cause: Secondary | ICD-10-CM | POA: Diagnosis not present

## 2022-01-02 DIAGNOSIS — Z8673 Personal history of transient ischemic attack (TIA), and cerebral infarction without residual deficits: Secondary | ICD-10-CM | POA: Diagnosis not present

## 2022-01-02 DIAGNOSIS — J431 Panlobular emphysema: Secondary | ICD-10-CM | POA: Diagnosis not present

## 2022-01-02 DIAGNOSIS — K219 Gastro-esophageal reflux disease without esophagitis: Secondary | ICD-10-CM | POA: Diagnosis present

## 2022-01-02 DIAGNOSIS — J432 Centrilobular emphysema: Secondary | ICD-10-CM | POA: Diagnosis not present

## 2022-01-02 DIAGNOSIS — J439 Emphysema, unspecified: Secondary | ICD-10-CM | POA: Diagnosis present

## 2022-01-02 DIAGNOSIS — S7221XA Displaced subtrochanteric fracture of right femur, initial encounter for closed fracture: Secondary | ICD-10-CM | POA: Diagnosis not present

## 2022-01-02 DIAGNOSIS — I700 Atherosclerosis of aorta: Secondary | ICD-10-CM | POA: Diagnosis present

## 2022-01-02 DIAGNOSIS — S72141A Displaced intertrochanteric fracture of right femur, initial encounter for closed fracture: Secondary | ICD-10-CM | POA: Diagnosis present

## 2022-01-02 DIAGNOSIS — Z87442 Personal history of urinary calculi: Secondary | ICD-10-CM | POA: Diagnosis not present

## 2022-01-02 DIAGNOSIS — J9611 Chronic respiratory failure with hypoxia: Secondary | ICD-10-CM | POA: Diagnosis present

## 2022-01-02 DIAGNOSIS — N1831 Chronic kidney disease, stage 3a: Secondary | ICD-10-CM | POA: Diagnosis present

## 2022-01-02 LAB — CBC WITH DIFFERENTIAL/PLATELET
Abs Immature Granulocytes: 0.09 10*3/uL — ABNORMAL HIGH (ref 0.00–0.07)
Basophils Absolute: 0 10*3/uL (ref 0.0–0.1)
Basophils Relative: 0 %
Eosinophils Absolute: 0 10*3/uL (ref 0.0–0.5)
Eosinophils Relative: 0 %
HCT: 39.2 % (ref 39.0–52.0)
Hemoglobin: 12.8 g/dL — ABNORMAL LOW (ref 13.0–17.0)
Immature Granulocytes: 1 %
Lymphocytes Relative: 7 %
Lymphs Abs: 1.1 10*3/uL (ref 0.7–4.0)
MCH: 29.6 pg (ref 26.0–34.0)
MCHC: 32.7 g/dL (ref 30.0–36.0)
MCV: 90.5 fL (ref 80.0–100.0)
Monocytes Absolute: 0.9 10*3/uL (ref 0.1–1.0)
Monocytes Relative: 6 %
Neutro Abs: 13.1 10*3/uL — ABNORMAL HIGH (ref 1.7–7.7)
Neutrophils Relative %: 86 %
Platelets: 223 10*3/uL (ref 150–400)
RBC: 4.33 MIL/uL (ref 4.22–5.81)
RDW: 16.5 % — ABNORMAL HIGH (ref 11.5–15.5)
WBC: 15.2 10*3/uL — ABNORMAL HIGH (ref 4.0–10.5)
nRBC: 0 % (ref 0.0–0.2)

## 2022-01-02 LAB — COMPREHENSIVE METABOLIC PANEL
ALT: 11 U/L (ref 0–44)
AST: 15 U/L (ref 15–41)
Albumin: 3.4 g/dL — ABNORMAL LOW (ref 3.5–5.0)
Alkaline Phosphatase: 41 U/L (ref 38–126)
Anion gap: 7 (ref 5–15)
BUN: 13 mg/dL (ref 8–23)
CO2: 24 mmol/L (ref 22–32)
Calcium: 8.8 mg/dL — ABNORMAL LOW (ref 8.9–10.3)
Chloride: 109 mmol/L (ref 98–111)
Creatinine, Ser: 1.25 mg/dL — ABNORMAL HIGH (ref 0.61–1.24)
GFR, Estimated: 59 mL/min — ABNORMAL LOW (ref >60)
Glucose, Bld: 160 mg/dL — ABNORMAL HIGH (ref 70–99)
Potassium: 4.2 mmol/L (ref 3.5–5.1)
Sodium: 140 mmol/L (ref 135–145)
Total Bilirubin: 1 mg/dL (ref 0.3–1.2)
Total Protein: 6.5 g/dL (ref 6.5–8.1)

## 2022-01-02 LAB — CBC
HCT: 36.1 % — ABNORMAL LOW (ref 39.0–52.0)
Hemoglobin: 11.7 g/dL — ABNORMAL LOW (ref 13.0–17.0)
MCH: 29.4 pg (ref 26.0–34.0)
MCHC: 32.4 g/dL (ref 30.0–36.0)
MCV: 90.7 fL (ref 80.0–100.0)
Platelets: 208 10*3/uL (ref 150–400)
RBC: 3.98 MIL/uL — ABNORMAL LOW (ref 4.22–5.81)
RDW: 16.5 % — ABNORMAL HIGH (ref 11.5–15.5)
WBC: 15.6 10*3/uL — ABNORMAL HIGH (ref 4.0–10.5)
nRBC: 0 % (ref 0.0–0.2)

## 2022-01-02 LAB — SARS CORONAVIRUS 2 (TAT 6-24 HRS): SARS Coronavirus 2: NEGATIVE

## 2022-01-02 MED ORDER — ONDANSETRON HCL 4 MG/2ML IJ SOLN
4.0000 mg | Freq: Once | INTRAMUSCULAR | Status: AC
  Administered 2022-01-02: 4 mg via INTRAVENOUS
  Filled 2022-01-02: qty 2

## 2022-01-02 MED ORDER — ONDANSETRON HCL 4 MG/2ML IJ SOLN
INTRAMUSCULAR | Status: AC
  Administered 2022-01-02: 4 mg via INTRAVENOUS
  Filled 2022-01-02: qty 2

## 2022-01-02 MED ORDER — SODIUM CHLORIDE 0.9 % IV SOLN
INTRAVENOUS | Status: AC
Start: 2022-01-02 — End: 2022-01-02

## 2022-01-02 MED ORDER — ONDANSETRON HCL 4 MG/2ML IJ SOLN
4.0000 mg | Freq: Four times a day (QID) | INTRAMUSCULAR | Status: DC | PRN
  Administered 2022-01-02: 4 mg via INTRAVENOUS
  Filled 2022-01-02: qty 2

## 2022-01-02 MED ORDER — METHOCARBAMOL 1000 MG/10ML IJ SOLN
1000.0000 mg | Freq: Once | INTRAVENOUS | Status: DC
  Filled 2022-01-02: qty 10

## 2022-01-02 MED ORDER — HYDROMORPHONE HCL 1 MG/ML IJ SOLN
1.0000 mg | Freq: Once | INTRAMUSCULAR | Status: AC
  Administered 2022-01-02: 1 mg via INTRAVENOUS
  Filled 2022-01-02: qty 1

## 2022-01-02 MED ORDER — METHOCARBAMOL 1000 MG/10ML IJ SOLN: 1000.0000 mg | Freq: Once | INTRAMUSCULAR | Status: DC

## 2022-01-02 MED ORDER — METHOCARBAMOL 500 MG PO TABS
500.0000 mg | ORAL_TABLET | Freq: Four times a day (QID) | ORAL | Status: DC | PRN
  Administered 2022-01-02 – 2022-01-07 (×2): 500 mg via ORAL
  Filled 2022-01-02 (×2): qty 1

## 2022-01-02 MED ORDER — HYDROMORPHONE HCL 1 MG/ML IJ SOLN
1.0000 mg | INTRAMUSCULAR | Status: DC | PRN
  Administered 2022-01-02 – 2022-01-07 (×6): 1 mg via INTRAVENOUS
  Filled 2022-01-02 (×7): qty 1

## 2022-01-02 MED ORDER — OXYCODONE HCL 5 MG PO TABS
5.0000 mg | ORAL_TABLET | ORAL | Status: DC | PRN
  Administered 2022-01-02 – 2022-01-05 (×4): 5 mg via ORAL
  Filled 2022-01-02 (×3): qty 1

## 2022-01-02 MED ORDER — PROCHLORPERAZINE EDISYLATE 10 MG/2ML IJ SOLN
10.0000 mg | INTRAMUSCULAR | Status: DC | PRN
  Administered 2022-01-02: 10 mg via INTRAVENOUS
  Filled 2022-01-02: qty 2

## 2022-01-02 NOTE — Assessment & Plan Note (Signed)
Possibly reactive, patient is not febrile.  Chest x-ray not suggestive of pneumonia. ?-UA ?-Repeat CBC in a.m. ?

## 2022-01-02 NOTE — H&P (Signed)
?History and Physical  ? ? ?Jeffrey Stephens YKD:[id number] DOB: 08-13-1941 DOA: 01/01/2022 ? ?PCP: Lavone Orn, MD ? ?Patient coming from: Home ? ?Chief Complaint: Right hip pain ? ?HPI: Jeffrey Stephens is a 81 y.o. male with medical history significant of paroxysmal atrial flutter on Eliquis, history of DVT, AAA, aortic atherosclerosis, history of lung cancer status post surgery in 2017, orthostatic hypotension, COPD with emphysema, chronic hypoxic respiratory failure on home oxygen, BPH, depression, GERD, cerebral aneurysm rupture status post repair surgery in 1994 presented to the ED complaining of right hip pain after a mechanical fall.  Labs showing WBC 15.2.  Hemoglobin 12.8, MCV 90.5.  Creatinine 1.2, was 0.9-1.1 on labs done in 2017.  X-ray of right hip/pelvis showing comminuted intertrochanteric proximal right femoral fracture with distal fragment adduction, cephalad displacement and impaction.  X-ray of right knee negative for acute finding.  Chest x-ray negative for acute finding.  CT head and C-spine negative for acute finding. ED physician discussed the case with Dr. Marlou Sa with orthopedics who will consult, surgery likely on Tuesday 4/11.  Patient was given Dilaudid, Zofran, and 1 L normal saline bolus. ? ?Patient states he walked to the kitchen to take his medications tonight and somehow lost his balance and fell. Wife also believes patient lost his balance and fell.  When she came to check on him he was conscious and could not get up from the floor.  He takes Eliquis and last dose was at 9 PM tonight.  He vomited after receiving pain pain medication in the ED, not vomiting at home.  No abdominal pain or diarrhea reported.  Patient reports chronic shortness of breath due to COPD, no acute change.  He uses 4 L home oxygen as needed.  Denies chest pain.  Reports hematuria for which he is currently being evaluated by outpatient urology. ? ?Review of Systems:  ?Review of Systems  ?All other systems reviewed  and are negative. ? ?Past Medical History:  ?Diagnosis Date  ? AAA (abdominal aortic aneurysm)   ? a. followed by Dr. Deitra Mayo - 01/2016 CT: 3.6 cm infrarenal AAA.  ? Abnormal nuclear cardiac imaging test 07/13/2014  ? a. 06/2014 MV: possible reversible inferior wall defect,no wma, nl EF, felt to be artifact.  ? Basal cell carcinoma   ? "LLE; some on my head"  ? BPH (benign prostatic hyperplasia)   ? Cerebral aneurysm 1994  ? Constipation   ? Depression   ? DVT (deep venous thrombosis) ([place]) 2001  ? Hx of Left leg   ? Dysrhythmia   ? AFIB   ? Frequency of urination   ? GERD (gastroesophageal reflux disease)   ? History of echocardiogram   ? a. 2D ECHO: 06/11/2014: EF 55-60%. Normal wall thickness. Indeterminate DD ( atrial flutter ). No RWMA. Mild LA dilation. Normal RV size and systolic function. No significant valvular abnormalities.  ? History of palpitations   ? OCCASIONAL  ? Kidney stones   ? Lung nodule   ? a. 02/2016 PET scan: intensely hypermetabolic 2.0 x 1.5 cm central RLL nodule (SUV 23).  ? Paroxysmal atrial flutter ([place])   ? a. On eliquis and sotalol (CHA2DS2VASc = 1).  ? Rheumatoid arthritis ([place])   ? "qwhere"  ? Stroke [facility]) 1994  ? "when I had ruptured aneurysm in my head"  ? TIA (transient ischemic attack)   ? 1994 WITH ANEYRYSM  ? ? ?Past Surgical History:  ?Procedure Laterality Date  ? BASAL CELL CARCINOMA  EXCISION    ? "LLE; top of my head"  ? CEREBRAL ANEURYSM REPAIR  1994  ? Hx of ruptured brain aneurysm Tx by Dr. Ellene Route  ? CYSTOSCOPY W/ STONE MANIPULATION  1980's X 1  ? EXCISIONAL HEMORRHOIDECTOMY  1970's  ? JOINT REPLACEMENT    ? LT KNEE  ? TOTAL KNEE ARTHROPLASTY Left 05/14/2014  ? Procedure: LEFT TOTAL KNEE ARTHROPLASTY;  Surgeon: Johnn Hai, MD;  Location: [facility];  Service: Orthopedics;  Laterality: Left;  ? VIDEO ASSISTED THORACOSCOPY (VATS)/WEDGE RESECTION Right 05/08/2016  ? Procedure: VIDEO ASSISTED THORACOSCOPY (VATS)/LUNG RESECTION;  Surgeon: Ivin Poot, MD;   Location: [facility];  Service: Thoracic;  Laterality: Right;  ? ? ? reports that he quit smoking about 29 years ago. His smoking use included cigarettes. He has a 92.50 pack-year smoking history. He has never used smokeless tobacco. He reports that he does not drink alcohol and does not use drugs. ? ?Allergies  ?Allergen Reactions  ? No Known Allergies Other (See Comments)  ? ? ?Family History  ?Problem Relation Age of Onset  ? Heart disease Father   ?     Aneurysm  ? Diabetes Father   ? Heart attack Father   ? Heart disease Brother   ?     Heart Disease before age 71  ? Cancer Brother   ? Hypertension Neg Hx   ? Stroke Neg Hx   ? ? ?Prior to Admission medications   ?Medication Sig Start Date End Date Taking? Authorizing Provider  ?acetaminophen (TYLENOL) 325 MG tablet Take 2 tablets (650 mg total) by mouth every 4 (four) hours as needed for headache or mild pain. 08/14/14   [name], NP  ?albuterol (VENTOLIN HFA) 108 (90 Base) MCG/ACT inhaler TAKE 2 PUFFS BY MOUTH EVERY 6 HOURS AS NEEDED FOR WHEEZE OR SHORTNESS OF BREATH 09/20/21   Collene Gobble, MD  ?ELIQUIS 5 MG TABS tablet TAKE 1 TABLET BY MOUTH TWICE A DAY 11/21/21   Croitoru, Mihai, MD  ?folic acid (FOLVITE) 1 MG tablet Take 1 mg by mouth every morning.     [provider]  ?lansoprazole (PREVACID) 30 MG capsule Take 30 mg by mouth every morning.    [provider]  ?methotrexate (RHEUMATREX) 5 MG tablet Take 5 mg by mouth once a week. Caution: Chemotherapy. Protect from light.    [provider]  ?Multiple Vitamins-Minerals (CENTRUM SILVER ADULT 50+) TABS Take 50 mg by mouth daily.    [provider]  ?Tiotropium Bromide-Olodaterol 2.5-2.5 MCG/ACT AERS Inhale 2 puffs into the lungs daily. 03/22/21   Martyn Ehrich, NP  ? ? ?Physical Exam: ?Vitals:  ? 01/02/22 0000 01/02/22 0100 01/02/22 0215 01/02/22 0400  ?BP: 117/62 (!) 117/57 126/69 138/68  ?Pulse: 85 89 93 89  ?Resp: (!) 25 20 13 12   ?Temp:      ?TempSrc:       ?SpO2: 99% 95% 96% 96%  ? ? ?Physical Exam ?Vitals reviewed.  ?HENT:  ?   Head: Normocephalic and atraumatic.  ?Cardiovascular:  ?   Rate and Rhythm: Normal rate and regular rhythm.  ?   Pulses: Normal pulses.  ?Pulmonary:  ?   Effort: Pulmonary effort is normal. No respiratory distress.  ?   Breath sounds: No wheezing or rales.  ?Abdominal:  ?   General: Bowel sounds are normal.  ?   Palpations: Abdomen is soft.  ?   Tenderness: There is no abdominal tenderness. There is no  guarding.  ?Musculoskeletal:     ?   General: No swelling or tenderness.  ?   Cervical back: Normal range of motion.  ?Skin: ?   General: Skin is warm and dry.  ?Neurological:  ?   Mental Status: He is alert and oriented to person, place, and time.  ?  ? ?Labs on Admission: I have personally reviewed following labs and imaging studies ? ?CBC: ?Recent Labs  ?Lab 01/02/22 ?0118  ?WBC 15.2*  ?NEUTROABS 13.1*  ?HGB 12.8*  ?HCT 39.2  ?MCV 90.5  ?PLT 223  ? ?Basic Metabolic Panel: ?Recent Labs  ?Lab 01/02/22 ?0118  ?NA 140  ?K 4.2  ?CL 109  ?CO2 24  ?GLUCOSE 160*  ?BUN 13  ?CREATININE 1.25*  ?CALCIUM 8.8*  ? ?GFR: ?CrCl cannot be calculated (Unknown ideal weight.). ?Liver Function Tests: ?Recent Labs  ?Lab 01/02/22 ?0118  ?AST 15  ?ALT 11  ?ALKPHOS 41  ?BILITOT 1.0  ?PROT 6.5  ?ALBUMIN 3.4*  ? ?No results for input(s): LIPASE, AMYLASE in the last 168 hours. ?No results for input(s): AMMONIA in the last 168 hours. ?Coagulation Profile: ?No results for input(s): INR, PROTIME in the last 168 hours. ?Cardiac Enzymes: ?No results for input(s): CKTOTAL, CKMB, CKMBINDEX, TROPONINI in the last 168 hours. ?BNP (last 3 results) ?No results for input(s): PROBNP in the last 8760 hours. ?HbA1C: ?No results for input(s): HGBA1C in the last 72 hours. ?CBG: ?No results for input(s): GLUCAP in the last 168 hours. ?Lipid Profile: ?No results for input(s): CHOL, HDL, LDLCALC, TRIG, CHOLHDL, LDLDIRECT in the last 72 hours. ?Thyroid Function Tests: ?No results for  input(s): TSH, T4TOTAL, FREET4, T3FREE, THYROIDAB in the last 72 hours. ?Anemia Panel: ?No results for input(s): VITAMINB12, FOLATE, FERRITIN, TIBC, IRON, RETICCTPCT in the last 72 hours. ?Urine analysis: ?   ?[place]

## 2022-01-02 NOTE — Assessment & Plan Note (Signed)
Chronic hypoxic respiratory failure ?Lungs clear on exam and stable on 4 L home oxygen.  Chest x-ray negative for acute finding. ?-Continue supplemental oxygen ?-Continue home inhalers after pharmacy med rec is done ?

## 2022-01-02 NOTE — Plan of Care (Signed)

## 2022-01-02 NOTE — Assessment & Plan Note (Signed)
Patient reports a remote history of DVT several years ago.  No clinical signs of DVT at this time. ?-Hold Eliquis at this time ?

## 2022-01-02 NOTE — Progress Notes (Signed)
81 year old patient with right hip intertrochanteric fracture after ground-level fall at home ?Patient is on Eliquis ?Plan for operative fixation likely tomorrow night.  Okay to eat today.  N.p.o. after midnight tonight ?Full consult to follow ?

## 2022-01-02 NOTE — Consult Note (Signed)
Reason for Consult:Right hip fx ?Referring Physician: Holli Humbles ?Time called: 1043 ?Time at bedside: 1107 ? ? ?Jeffrey Stephens is an 81 y.o. male.  ?HPI: Kayen fell at home yesterday. He's unsure what caused the fall but had immediate right hip pain and could not get up. He was brought to the ED where x-rays showed a right hip fx and orthopedic surgery was consulted. He lives at home with his wife. ? ?Past Medical History:  ?Diagnosis Date  ? AAA (abdominal aortic aneurysm) ([place])   ? a. followed by Dr. Deitra Mayo - 01/2016 CT: 3.6 cm infrarenal AAA.  ? Abnormal nuclear cardiac imaging test 07/13/2014  ? a. 06/2014 MV: possible reversible inferior wall defect,no wma, nl EF, felt to be artifact.  ? Basal cell carcinoma   ? "LLE; some on my head"  ? BPH (benign prostatic hyperplasia)   ? Cerebral aneurysm 1994  ? Constipation   ? Depression   ? DVT (deep venous thrombosis) ([place]) 2001  ? Hx of Left leg   ? Dysrhythmia   ? AFIB   ? Frequency of urination   ? GERD (gastroesophageal reflux disease)   ? History of echocardiogram   ? a. 2D ECHO: 06/11/2014: EF 55-60%. Normal wall thickness. Indeterminate DD ( atrial flutter ). No RWMA. Mild LA dilation. Normal RV size and systolic function. No significant valvular abnormalities.  ? History of palpitations   ? OCCASIONAL  ? Kidney stones   ? Lung nodule   ? a. 02/2016 PET scan: intensely hypermetabolic 2.0 x 1.5 cm central RLL nodule (SUV 23).  ? Paroxysmal atrial flutter ([place])   ? a. On eliquis and sotalol (CHA2DS2VASc = 1).  ? Rheumatoid arthritis ([place])   ? "qwhere"  ? Stroke [facility]) 1994  ? "when I had ruptured aneurysm in my head"  ? TIA (transient ischemic attack)   ? 1994 WITH ANEYRYSM  ? ? ?Past Surgical History:  ?Procedure Laterality Date  ? BASAL CELL CARCINOMA EXCISION    ? "LLE; top of my head"  ? CEREBRAL ANEURYSM REPAIR  1994  ? Hx of ruptured brain aneurysm Tx by Dr. Ellene Route  ? CYSTOSCOPY W/ STONE MANIPULATION  1980's X 1  ? EXCISIONAL  HEMORRHOIDECTOMY  1970's  ? JOINT REPLACEMENT    ? LT KNEE  ? TOTAL KNEE ARTHROPLASTY Left 05/14/2014  ? Procedure: LEFT TOTAL KNEE ARTHROPLASTY;  Surgeon: Johnn Hai, MD;  [facility];  Service: Orthopedics;  Laterality: Left;  ? VIDEO ASSISTED THORACOSCOPY (VATS)/WEDGE RESECTION Right 05/08/2016  ? Procedure: VIDEO ASSISTED THORACOSCOPY (VATS)/LUNG RESECTION;  Surgeon: Ivin Poot, MD;  Location: [facility];  Service: Thoracic;  Laterality: Right;  ? ? ?Family History  ?Problem Relation Age of Onset  ? Heart disease Father   ?     Aneurysm  ? Diabetes Father   ? Heart attack Father   ? Heart disease Brother   ?     Heart Disease before age 72  ? Cancer Brother   ? Hypertension Neg Hx   ? Stroke Neg Hx   ? ? ?Social History:  reports that he quit smoking about 29 years ago. His smoking use included cigarettes. He has a 92.50 pack-year smoking history. He has never used smokeless tobacco. He reports that he does not drink alcohol and does not use drugs. ? ?Allergies:  ?Allergies  ?Allergen Reactions  ? No Known Allergies Other (See Comments)  ? ? ?Medications: I have reviewed the patient's current medications. ? ?  Results for orders placed or performed during the hospital encounter of 01/01/22 (from the past 48 hour(s))  ?CBC with Differential/Platelet     Status: Abnormal  ? Collection Time: 01/02/22  1:18 AM  ?Result Value Ref Range  ? WBC 15.2 (H) 4.0 - 10.5 K/uL  ? RBC 4.33 4.22 - 5.81 MIL/uL  ? Hemoglobin 12.8 (L) 13.0 - 17.0 g/dL  ? HCT 39.2 39.0 - 52.0 %  ? MCV 90.5 80.0 - 100.0 fL  ? MCH 29.6 26.0 - 34.0 pg  ? MCHC 32.7 30.0 - 36.0 g/dL  ? RDW 16.5 (H) 11.5 - 15.5 %  ? Platelets 223 150 - 400 K/uL  ? nRBC 0.0 0.0 - 0.2 %  ? Neutrophils Relative % 86 %  ? Neutro Abs 13.1 (H) 1.7 - 7.7 K/uL  ? Lymphocytes Relative 7 %  ? Lymphs Abs 1.1 0.7 - 4.0 K/uL  ? Monocytes Relative 6 %  ? Monocytes Absolute 0.9 0.1 - 1.0 K/uL  ? Eosinophils Relative 0 %  ? Eosinophils Absolute 0.0 0.0 - 0.5 K/uL  ? Basophils  Relative 0 %  ? Basophils Absolute 0.0 0.0 - 0.1 K/uL  ? Immature Granulocytes 1 %  ? Abs Immature Granulocytes 0.09 (H) 0.00 - 0.07 K/uL  ?  Comment: Performed at [facility], [place] [address] [place], [place] [address]  ?Comprehensive metabolic panel     Status: Abnormal  ? Collection Time: 01/02/22  1:18 AM  ?Result Value Ref Range  ? Sodium 140 135 - 145 mmol/L  ? Potassium 4.2 3.5 - 5.1 mmol/L  ? Chloride 109 98 - 111 mmol/L  ? CO2 24 22 - 32 mmol/L  ? Glucose, Bld 160 (H) 70 - 99 mg/dL  ?  Comment: Glucose reference range applies only to samples taken after fasting for at least 8 hours.  ? BUN 13 8 - 23 mg/dL  ? Creatinine, Ser 1.25 (H) 0.61 - 1.24 mg/dL  ? Calcium 8.8 (L) 8.9 - 10.3 mg/dL  ? Total Protein 6.5 6.5 - 8.1 g/dL  ? Albumin 3.4 (L) 3.5 - 5.0 g/dL  ? AST 15 15 - 41 U/L  ? ALT 11 0 - 44 U/L  ? Alkaline Phosphatase 41 38 - 126 U/L  ? Total Bilirubin 1.0 0.3 - 1.2 mg/dL  ? GFR, Estimated 59 (L) >60 mL/min  ?  Comment: (NOTE) ?Calculated using the CKD-EPI Creatinine Equation (2021) ?  ? Anion gap 7 5 - 15  ?  Comment: Performed at [facility], [place] [address], [place], [place] [address]  ?CBC     Status: Abnormal  ? Collection Time: 01/02/22  5:54 AM  ?Result Value Ref Range  ? WBC 15.6 (H) 4.0 - 10.5 K/uL  ? RBC 3.98 (L) 4.22 - 5.81 MIL/uL  ? Hemoglobin 11.7 (L) 13.0 - 17.0 g/dL  ? HCT 36.1 (L) 39.0 - 52.0 %  ? MCV 90.7 80.0 - 100.0 fL  ? MCH 29.4 26.0 - 34.0 pg  ? MCHC 32.4 30.0 - 36.0 g/dL  ? RDW 16.5 (H) 11.5 - 15.5 %  ? Platelets 208 150 - 400 K/uL  ? nRBC 0.0 0.0 - 0.2 %  ?  Comment: Performed at [facility], [place] [address] [place], [place] [address]  ? ? ?DG Chest 1 View ? ?Result Date: 01/02/2022 ?CLINICAL DATA:  Fall injury with right hip fracture, preoperative chest evaluation, right knee pain. EXAM: DG HIP (WITH OR WITHOUT PELVIS)  2-3V RIGHT; RIGHT KNEE - COMPLETE 4+ VIEW; CHEST 1 VIEW COMPARISON:  PA and lateral chest 06/07/2016, CT abdomen and pelvis and reconstructions  12/19/2021. No prior available right knee series. FINDINGS: Chest: A single supine AP chest film was obtained at 1:20 a.m., 01/02/2022. There is tangle of overlying monitor wires. There is aortic atherosclerosis with stable mediastinum normal cardiac size. The right lateral CP sulcus was excluded from the exam. Lungs emphysematous but clear. No significant pleural collection is seen and no pneumothorax. Osteopenia. There is thoracic spondylosis and mild thoracic dextroscoliosis. Clips superimpose in the right infrahilar area, not seen previously. AP pelvis, right hip views: Osteopenia. There is a longitudinal acute intertrochanteric proximal right femoral fracture, with the main distal fragment about 40 degrees adducted, and cephalad displaced with the femoral neck and shaft roughly at right angles to each other, with the lesser trochanter separately fractured and medially displaced. The femoral head is normally located. No pelvic fracture or diastasis is seen with spurring seen at the SI joints, pubic symphysis and mild degenerative arthrosis of the hips. There are clustered calcifications in the prostate bed. The abdominal aorta and common iliac arteries are heavily calcified. There is moderate soft tissue fullness at the level of the fracture. Right knee: There is osteopenia with advanced femorotibial nonerosive degenerative arthrosis and relatively mild patellofemoral degenerative arthrosis. Evidence of fractures is not seen. No suprapatellar bursal effusion is evident. There is calcification in the distal femoral and popliteal arteries. There is artifact from overlying clothing. IMPRESSION: 1. No evidence of acute chest disease.  COPD. 2. Aortic and femoropopliteal calcific atherosclerosis. 3. Comminuted intertrochanteric proximal right femoral fracture with distal fragment adduction, cephalad displacement and impaction. 4. Osteopenia and degenerative change of the right knee without evidence of fractures.  Electronically Signed   By: Telford Nab M.D.   On: 01/02/2022 02:00  ? ?CT Head Wo Contrast ? ?Result Date: 01/02/2022 ?CLINICAL DATA:  Witnessed mechanical fall EXAM: CT HEAD WITHOUT CONTRAST CT CERVICAL SPINE WITHOUT CO

## 2022-01-02 NOTE — ED Notes (Signed)
Pt has 2+ right pedal pulse, cap refill less than 3 sec, pt able to move foot, sensation intact. Pt denies numbness and tingling. ?

## 2022-01-02 NOTE — Assessment & Plan Note (Addendum)
Secondary to a mechanical fall. X-ray of right hip/pelvis showing comminuted intertrochanteric proximal right femoral fracture with distal fragment adduction, cephalad displacement and impaction. ?-ED physician discussed the case with Dr. Marlou Sa with orthopedics who will consult, surgery likely on Tuesday 4/11. ?-Keep n.p.o. until patient is seen by orthopedics in the morning. ?-Nonweightbearing ?-Continue pain management: Dilaudid as needed for severe pain, Robaxin as needed for muscle spasms ?-Hold Eliquis, last dose was 9 PM on 4/9 ?

## 2022-01-02 NOTE — ED Notes (Signed)
PT received bladder scan 8:47 am results 167 ?

## 2022-01-02 NOTE — Progress Notes (Signed)
?PROGRESS NOTE ? ? ? ?Jeffrey Stephens  WPY:[id number] DOB: [birth year] DOA: 01/01/2022 ?PCP: Lavone Orn, MD ? ? ?Brief Narrative:  ?Jeffrey Stephens is a 81 y.o. male with medical history significant of paroxysmal atrial flutter on Eliquis, history of DVT, AAA, aortic atherosclerosis, history of lung cancer status post surgery in 2017, orthostatic hypotension, COPD with emphysema, chronic hypoxic respiratory failure on home oxygen, BPH, depression, GERD, cerebral aneurysm rupture status post repair surgery in 1994.  ? ?Patient presented to the ED complaining of right hip pain after a mechanical fall with subsequent right hip pain, imaging in the ED shows comminuted intertrochanteric proximal right femoral fracture.  Ortho consulted at intake, hospitalist called for admission.   ? ? ?Assessment & Plan: ?  ?Principal Problem: ?  Hip fracture ([place]) ?Active Problems: ?  Paroxysmal atrial flutter ([place]) ?  COPD with emphysema ([place]) ?  Leukocytosis ?  Normocytic anemia ?  History of DVT (deep vein thrombosis) ? ?Right comminuted intertrochanteric proximal femur fracture ?Secondary to mechanical fall ambulatory dysfunction, acute on chronic ?Orthopedics following, surgery tentatively planned for Tuesday the 11th ?N.p.o. at midnight ?Pain currently well controlled on OxyContin IR p.o. and Dilaudid IV ?Zofran for any breakthrough nausea secondary to pain or narcotics ?  ?Normocytic anemia ?Subacute to chronic hematuria ?Scant hematuria noted at intake followed outpatient by urology ?No other signs or symptoms of bleeding ?  ?Leukocytosis, reactive ?Continue to follow clinically -no sources of infection at this time ? ?COPD with emphysema ([place]), not in acute exacerbation ?Chronic hypoxia, wears oxygen "as needed at home -unclear oxygen flow rate ?Continue to wean oxygen as tolerated while admitted, ambulatory oxygen screening pending surgery as above ?  ?Paroxysmal atrial flutter ([place]), rate controlled ?Not currently on any  rate control medication, unable to tolerate sotalol in the outpatient setting due to bradycardia  ?Hold Eliquis at this time perioperatively ? ?History of DVT ?Remote-on Eliquis as below for a flutter ?  ?DVT prophylaxis: SCDs -on Eliquis outpatient currently on hold perioperatively ?Code Status: Full Code (discussed with the patient) ?Family Communication: Patient's wife and daughter at bedside. ? ?Status is: Inpatient ? ?Dispo: The patient is from: Home ?             Anticipated d/c is to: To be determined ?             Anticipated d/c date is: 48 to 72 hours ?             Patient currently not medically stable for discharge ? ?Consultants:  ?Orthopedics ? ?Procedures:  ?ORIF right femur pending orthopedic surgery ? ?Antimicrobials:  ?None indicated ? ?Subjective: ?Ongoing pain and nausea this morning secondary to fracture, otherwise denies chest pain shortness of breath headache fevers chills constipation or diarrhea ? ?Objective: ?Vitals:  ? 01/02/22 0000 01/02/22 0100 01/02/22 0215 01/02/22 0400  ?BP: 117/62 (!) 117/57 126/69 138/68  ?Pulse: 85 89 93 89  ?Resp: (!) 25 20 13 12   ?Temp:      ?TempSrc:      ?SpO2: 99% 95% 96% 96%  ? ? ?Intake/Output Summary (Last 24 hours) at 01/02/2022 0728 ?Last data filed at 01/02/2022 0456 ?Gross per 24 hour  ?Intake 1000 ml  ?Output --  ?Net 1000 ml  ? ?There were no vitals filed for this visit. ? ?Examination: ? ?General:  Pleasantly resting in bed, mild distress secondary to pain and nausea ?Lungs:  Clear to auscultate bilaterally without rhonchi, wheeze, or rales. ?Heart:  Regular  rate and rhythm.  Without murmurs, rubs, or gallops. ?Abdomen:  Soft, nontender, nondistended.  Without guarding or rebound. ? ?Data Reviewed: I have personally reviewed following labs and imaging studies ? ?CBC: ?Recent Labs  ?Lab 01/02/22 ?0118 01/02/22 ?9563  ?WBC 15.2* 15.6*  ?NEUTROABS 13.1*  --   ?HGB 12.8* 11.7*  ?HCT 39.2 36.1*  ?MCV 90.5 90.7  ?PLT 223 208  ? ?Basic Metabolic  Panel: ?Recent Labs  ?Lab 01/02/22 ?0118  ?NA 140  ?K 4.2  ?CL 109  ?CO2 24  ?GLUCOSE 160*  ?BUN 13  ?CREATININE 1.25*  ?CALCIUM 8.8*  ? ?GFR: ?CrCl cannot be calculated (Unknown ideal weight.). ?Liver Function Tests: ?Recent Labs  ?Lab 01/02/22 ?0118  ?AST 15  ?ALT 11  ?ALKPHOS 41  ?BILITOT 1.0  ?PROT 6.5  ?ALBUMIN 3.4*  ? ?No results for input(s): LIPASE, AMYLASE in the last 168 hours. ?No results for input(s): AMMONIA in the last 168 hours. ?Coagulation Profile: ?No results for input(s): INR, PROTIME in the last 168 hours. ?Cardiac Enzymes: ?No results for input(s): CKTOTAL, CKMB, CKMBINDEX, TROPONINI in the last 168 hours. ?BNP (last 3 results) ?No results for input(s): PROBNP in the last 8760 hours. ?HbA1C: ?No results for input(s): HGBA1C in the last 72 hours. ?CBG: ?No results for input(s): GLUCAP in the last 168 hours. ?Lipid Profile: ?No results for input(s): CHOL, HDL, LDLCALC, TRIG, CHOLHDL, LDLDIRECT in the last 72 hours. ?Thyroid Function Tests: ?No results for input(s): TSH, T4TOTAL, FREET4, T3FREE, THYROIDAB in the last 72 hours. ?Anemia Panel: ?No results for input(s): VITAMINB12, FOLATE, FERRITIN, TIBC, IRON, RETICCTPCT in the last 72 hours. ?Sepsis Labs: ?No results for input(s): PROCALCITON, LATICACIDVEN in the last 168 hours. ? ?No results found for this or any previous visit (from the past 240 hour(s)).  ? ? ? ? ? ?Radiology Studies: ?DG Chest 1 View ? ?Result Date: 01/02/2022 ?CLINICAL DATA:  Fall injury with right hip fracture, preoperative chest evaluation, right knee pain. EXAM: DG HIP (WITH OR WITHOUT PELVIS) 2-3V RIGHT; RIGHT KNEE - COMPLETE 4+ VIEW; CHEST 1 VIEW COMPARISON:  PA and lateral chest 06/07/2016, CT abdomen and pelvis and reconstructions 12/19/2021. No prior available right knee series. FINDINGS: Chest: A single supine AP chest film was obtained at 1:20 a.m., 01/02/2022. There is tangle of overlying monitor wires. There is aortic atherosclerosis with stable mediastinum normal  cardiac size. The right lateral CP sulcus was excluded from the exam. Lungs emphysematous but clear. No significant pleural collection is seen and no pneumothorax. Osteopenia. There is thoracic spondylosis and mild thoracic dextroscoliosis. Clips superimpose in the right infrahilar area, not seen previously. AP pelvis, right hip views: Osteopenia. There is a longitudinal acute intertrochanteric proximal right femoral fracture, with the main distal fragment about 40 degrees adducted, and cephalad displaced with the femoral neck and shaft roughly at right angles to each other, with the lesser trochanter separately fractured and medially displaced. The femoral head is normally located. No pelvic fracture or diastasis is seen with spurring seen at the SI joints, pubic symphysis and mild degenerative arthrosis of the hips. There are clustered calcifications in the prostate bed. The abdominal aorta and common iliac arteries are heavily calcified. There is moderate soft tissue fullness at the level of the fracture. Right knee: There is osteopenia with advanced femorotibial nonerosive degenerative arthrosis and relatively mild patellofemoral degenerative arthrosis. Evidence of fractures is not seen. No suprapatellar bursal effusion is evident. There is calcification in the distal femoral and popliteal arteries. There is artifact  from overlying clothing. IMPRESSION: 1. No evidence of acute chest disease.  COPD. 2. Aortic and femoropopliteal calcific atherosclerosis. 3. Comminuted intertrochanteric proximal right femoral fracture with distal fragment adduction, cephalad displacement and impaction. 4. Osteopenia and degenerative change of the right knee without evidence of fractures. Electronically Signed   By: Telford Nab M.D.   On: 01/02/2022 02:00  ? ?CT Head Wo Contrast ? ?Result Date: 01/02/2022 ?CLINICAL DATA:  Witnessed mechanical fall EXAM: CT HEAD WITHOUT CONTRAST CT CERVICAL SPINE WITHOUT CONTRAST TECHNIQUE:  Multidetector CT imaging of the head and cervical spine was performed following the standard protocol without intravenous contrast. Multiplanar CT image reconstructions of the cervical spine were also generated. RADIATION DO

## 2022-01-02 NOTE — TOC CAGE-AID Note (Signed)
Transition of Care (TOC) - CAGE-AID Screening ? ? ?Patient Details  ?Name: Jeffrey Stephens ?MRN: [id number] ?Date of Birth: [birth year] ? ?Transition of Care (TOC) CM/SW Contact:    ?Coreen Shippee C Tarpley-Carter, LCSWA ?Phone Number: ?01/02/2022, 11:51 AM ? ? ?Clinical Narrative: ?Pt participated in [place].  Pt stated he does smokes cigarettes.  Pt was offered resources, due to usage of substance.    ? ?Passenger transport manager, MSW, LCSW-A ?Pronouns:  She/Her/Hers ?[facility]itions of Care ?Clinical Social Worker ?Direct Number:  [phone] ?[email or web address] ? ?CAGE-AID Screening: ?  ? ?Have You Ever Felt You Ought to Cut Down on Your Drinking or Drug Use?: Yes ?Have People Annoyed You By Critizing Your Drinking Or Drug Use?: No ?Have You Felt Bad Or Guilty About Your Drinking Or Drug Use?: No ?Have You Ever Had a Drink or Used Drugs First Thing In The Morning to Steady Your Nerves or to Get Rid of a Hangover?: No ?CAGE-AID Score: 1 ? ?Substance Abuse Education Offered: Yes ? ?Substance abuse interventions: Educational Materials ? ? ? ? ? ? ?

## 2022-01-02 NOTE — Assessment & Plan Note (Signed)
Mild.  Patient is not endorsing any symptoms of GI bleed.  Does endorse hematuria for which he is currently being evaluated by outpatient urology. ?-UA ?-Repeat CBC in a.m. ?-Outpatient urology follow-up ?

## 2022-01-02 NOTE — Assessment & Plan Note (Signed)
Followed by Dr. [name], was previously on sotalol which caused excessive bradycardia.  He is no longer on any active antiarrhythmic therapy and per cardiology note wants to avoid pacemaker implantation.  Currently in sinus rhythm. ?-Hold Eliquis at this time ?

## 2022-01-03 ENCOUNTER — Inpatient Hospital Stay (HOSPITAL_COMMUNITY): Payer: Medicare PPO | Admitting: Anesthesiology

## 2022-01-03 ENCOUNTER — Encounter (HOSPITAL_COMMUNITY)
Admission: EM | Disposition: A | Discharge status: Home Health | Payer: Self-pay | Source: Home / Self Care | Attending: Internal Medicine

## 2022-01-03 ENCOUNTER — Other Ambulatory Visit: Payer: Self-pay

## 2022-01-03 ENCOUNTER — Inpatient Hospital Stay (HOSPITAL_COMMUNITY): Payer: Medicare PPO

## 2022-01-03 ENCOUNTER — Encounter (HOSPITAL_COMMUNITY): Payer: Self-pay | Admitting: Internal Medicine

## 2022-01-03 DIAGNOSIS — D72823 Leukemoid reaction: Secondary | ICD-10-CM | POA: Diagnosis not present

## 2022-01-03 DIAGNOSIS — S72001A Fracture of unspecified part of neck of right femur, initial encounter for closed fracture: Secondary | ICD-10-CM | POA: Diagnosis not present

## 2022-01-03 DIAGNOSIS — S72141A Displaced intertrochanteric fracture of right femur, initial encounter for closed fracture: Secondary | ICD-10-CM

## 2022-01-03 DIAGNOSIS — J449 Chronic obstructive pulmonary disease, unspecified: Secondary | ICD-10-CM

## 2022-01-03 DIAGNOSIS — M25461 Effusion, right knee: Secondary | ICD-10-CM

## 2022-01-03 DIAGNOSIS — Z86718 Personal history of other venous thrombosis and embolism: Secondary | ICD-10-CM | POA: Diagnosis not present

## 2022-01-03 DIAGNOSIS — M1711 Unilateral primary osteoarthritis, right knee: Secondary | ICD-10-CM

## 2022-01-03 DIAGNOSIS — S7221XA Displaced subtrochanteric fracture of right femur, initial encounter for closed fracture: Secondary | ICD-10-CM

## 2022-01-03 DIAGNOSIS — J432 Centrilobular emphysema: Secondary | ICD-10-CM | POA: Diagnosis not present

## 2022-01-03 HISTORY — PX: INTRAMEDULLARY (IM) NAIL INTERTROCHANTERIC: SHX5875

## 2022-01-03 LAB — BASIC METABOLIC PANEL
Anion gap: 6 (ref 5–15)
BUN: 22 mg/dL (ref 8–23)
CO2: 25 mmol/L (ref 22–32)
Calcium: 8.6 mg/dL — ABNORMAL LOW (ref 8.9–10.3)
Chloride: 109 mmol/L (ref 98–111)
Creatinine, Ser: 1.61 mg/dL — ABNORMAL HIGH (ref 0.61–1.24)
GFR, Estimated: 43 mL/min — ABNORMAL LOW (ref >60)
Glucose, Bld: 127 mg/dL — ABNORMAL HIGH (ref 70–99)
Potassium: 4.8 mmol/L (ref 3.5–5.1)
Sodium: 140 mmol/L (ref 135–145)

## 2022-01-03 LAB — CBC
HCT: 32.5 % — ABNORMAL LOW (ref 39.0–52.0)
Hemoglobin: 10.4 g/dL — ABNORMAL LOW (ref 13.0–17.0)
MCH: 29.3 pg (ref 26.0–34.0)
MCHC: 32 g/dL (ref 30.0–36.0)
MCV: 91.5 fL (ref 80.0–100.0)
Platelets: 198 10*3/uL (ref 150–400)
RBC: 3.55 MIL/uL — ABNORMAL LOW (ref 4.22–5.81)
RDW: 17 % — ABNORMAL HIGH (ref 11.5–15.5)
WBC: 14.1 10*3/uL — ABNORMAL HIGH (ref 4.0–10.5)
nRBC: 0 % (ref 0.0–0.2)

## 2022-01-03 LAB — SURGICAL PCR SCREEN
MRSA, PCR: NEGATIVE
Staphylococcus aureus: NEGATIVE

## 2022-01-03 SURGERY — FIXATION, FRACTURE, INTERTROCHANTERIC, WITH INTRAMEDULLARY ROD
Anesthesia: General | Laterality: Right

## 2022-01-03 MED ORDER — MORPHINE SULFATE (PF) 2 MG/ML IV SOLN: INTRAVENOUS | Status: DC | PRN

## 2022-01-03 MED ORDER — SODIUM CHLORIDE 0.9 % IV SOLN
0.0000 ug/min | INTRAVENOUS | Status: DC
  Filled 2022-01-03 (×3): qty 2

## 2022-01-03 MED ORDER — ENSURE ENLIVE PO LIQD
237.0000 mL | Freq: Two times a day (BID) | ORAL | Status: DC
  Administered 2022-01-04 – 2022-01-07 (×5): 237 mL via ORAL
  Filled 2022-01-03: qty 237

## 2022-01-03 MED ORDER — BUPIVACAINE HCL (PF) 0.25 % IJ SOLN
INTRAMUSCULAR | Status: DC | PRN
  Administered 2022-01-03: 10 mL

## 2022-01-03 MED ORDER — LIDOCAINE 2% (20 MG/ML) 5 ML SYRINGE
INTRAMUSCULAR | Status: DC | PRN
  Administered 2022-01-03: 60 mg via INTRAVENOUS

## 2022-01-03 MED ORDER — CHLORHEXIDINE GLUCONATE 0.12 % MT SOLN
15.0000 mL | Freq: Once | OROMUCOSAL | Status: AC
  Administered 2022-01-03: 15 mL via OROMUCOSAL
  Filled 2022-01-03: qty 15

## 2022-01-03 MED ORDER — ROCURONIUM BROMIDE 10 MG/ML (PF) SYRINGE
PREFILLED_SYRINGE | INTRAVENOUS | Status: DC | PRN
  Administered 2022-01-03: 50 mg via INTRAVENOUS
  Administered 2022-01-03: 20 mg via INTRAVENOUS
  Administered 2022-01-03: 10 mg via INTRAVENOUS
  Administered 2022-01-03: 20 mg via INTRAVENOUS

## 2022-01-03 MED ORDER — ORAL CARE MOUTH RINSE: 15.0000 mL | Freq: Once | OROMUCOSAL | Status: AC

## 2022-01-03 MED ORDER — LACTATED RINGERS IV BOLUS
250.0000 mL | Freq: Once | INTRAVENOUS | Status: AC
  Administered 2022-01-03: 250 mL via INTRAVENOUS

## 2022-01-03 MED ORDER — 0.9 % SODIUM CHLORIDE (POUR BTL) OPTIME
TOPICAL | Status: DC | PRN
  Administered 2022-01-03: 1500 mL

## 2022-01-03 MED ORDER — PHENYLEPHRINE HCL (PRESSORS) 10 MG/ML IV SOLN
INTRAVENOUS | Status: DC | PRN
  Administered 2022-01-03 (×3): 120 ug via INTRAVENOUS

## 2022-01-03 MED ORDER — FENTANYL CITRATE (PF) 250 MCG/5ML IJ SOLN
INTRAMUSCULAR | Status: DC | PRN
  Administered 2022-01-03 (×2): 25 ug via INTRAVENOUS
  Administered 2022-01-03: 100 ug via INTRAVENOUS

## 2022-01-03 MED ORDER — TRANEXAMIC ACID-NACL 1000-0.7 MG/100ML-% IV SOLN
INTRAVENOUS | Status: DC | PRN
  Administered 2022-01-03: 1000 mg via INTRAVENOUS

## 2022-01-03 MED ORDER — ALBUMIN HUMAN 5 % IV SOLN
12.5000 g | Freq: Once | INTRAVENOUS | Status: AC
  Administered 2022-01-03: 12.5 g via INTRAVENOUS

## 2022-01-03 MED ORDER — ALBUMIN HUMAN 5 % IV SOLN: INTRAVENOUS | Status: DC | PRN

## 2022-01-03 MED ORDER — LACTATED RINGERS IV SOLN: INTRAVENOUS | Status: DC

## 2022-01-03 MED ORDER — HYDROMORPHONE HCL 1 MG/ML IJ SOLN
0.5000 mg | Freq: Once | INTRAMUSCULAR | Status: AC
  Administered 2022-01-03: 0.5 mg via INTRAVENOUS

## 2022-01-03 MED ORDER — ALBUMIN HUMAN 5 % IV SOLN
INTRAVENOUS | Status: AC
  Filled 2022-01-03: qty 250

## 2022-01-03 MED ORDER — MORPHINE SULFATE (PF) 2 MG/ML IV SOLN
INTRAVENOUS | Status: DC | PRN
  Administered 2022-01-03: 33 mL via INTRAMUSCULAR

## 2022-01-03 MED ORDER — SUGAMMADEX SODIUM 200 MG/2ML IV SOLN
INTRAVENOUS | Status: DC | PRN
  Administered 2022-01-03: 300 mg via INTRAVENOUS

## 2022-01-03 MED ORDER — TRANEXAMIC ACID-NACL 1000-0.7 MG/100ML-% IV SOLN
INTRAVENOUS | Status: AC
  Filled 2022-01-03: qty 100

## 2022-01-03 MED ORDER — ADULT MULTIVITAMIN W/MINERALS CH
1.0000 | ORAL_TABLET | Freq: Every day | ORAL | Status: DC
  Administered 2022-01-04 – 2022-01-08 (×5): 1 via ORAL
  Filled 2022-01-03 (×6): qty 1

## 2022-01-03 MED ORDER — CLONIDINE HCL (ANALGESIA) 100 MCG/ML EP SOLN
EPIDURAL | Status: AC
  Filled 2022-01-03: qty 10

## 2022-01-03 MED ORDER — ONDANSETRON HCL 4 MG/2ML IJ SOLN
INTRAMUSCULAR | Status: DC | PRN
  Administered 2022-01-03: 4 mg via INTRAVENOUS

## 2022-01-03 MED ORDER — BUPIVACAINE HCL (PF) 0.25 % IJ SOLN
INTRAMUSCULAR | Status: AC
  Filled 2022-01-03: qty 10

## 2022-01-03 MED ORDER — ESMOLOL HCL 100 MG/10ML IV SOLN
INTRAVENOUS | Status: DC | PRN
  Administered 2022-01-03: 10 mg via INTRAVENOUS

## 2022-01-03 MED ORDER — PROPOFOL 10 MG/ML IV BOLUS
INTRAVENOUS | Status: DC | PRN
  Administered 2022-01-03: 130 mg via INTRAVENOUS

## 2022-01-03 MED ORDER — CEFAZOLIN SODIUM-DEXTROSE 2-4 GM/100ML-% IV SOLN
2.0000 g | INTRAVENOUS | Status: AC
  Administered 2022-01-03: 2 g via INTRAVENOUS
  Filled 2022-01-03: qty 100

## 2022-01-03 MED ORDER — MORPHINE SULFATE (PF) 4 MG/ML IV SOLN
INTRAVENOUS | Status: AC
  Filled 2022-01-03: qty 2

## 2022-01-03 MED ORDER — PHENYLEPHRINE HCL-NACL 20-0.9 MG/250ML-% IV SOLN
0.0000 ug/min | INTRAVENOUS | Status: DC
  Administered 2022-01-03: 20 ug/min via INTRAVENOUS
  Filled 2022-01-03: qty 250

## 2022-01-03 MED ORDER — SODIUM CHLORIDE 0.9 % IV SOLN: 0.0000 ug/min | INTRAVENOUS | Status: DC

## 2022-01-03 MED ORDER — POVIDONE-IODINE 10 % EX SWAB
2.0000 "application " | Freq: Once | CUTANEOUS | Status: AC
  Administered 2022-01-03: 2 via TOPICAL

## 2022-01-03 MED ORDER — BUPIVACAINE HCL (PF) 0.25 % IJ SOLN
INTRAMUSCULAR | Status: AC
  Filled 2022-01-03: qty 30

## 2022-01-03 MED ORDER — CHLORHEXIDINE GLUCONATE 4 % EX LIQD
60.0000 mL | Freq: Once | CUTANEOUS | Status: AC
  Administered 2022-01-03: 4 via TOPICAL
  Filled 2022-01-03: qty 60

## 2022-01-03 MED ORDER — FENTANYL CITRATE (PF) 250 MCG/5ML IJ SOLN
INTRAMUSCULAR | Status: AC
  Filled 2022-01-03: qty 5

## 2022-01-03 MED ORDER — DEXAMETHASONE SODIUM PHOSPHATE 10 MG/ML IJ SOLN
INTRAMUSCULAR | Status: DC | PRN
  Administered 2022-01-03: 5 mg via INTRAVENOUS

## 2022-01-03 MED ORDER — PHENYLEPHRINE HCL-NACL 20-0.9 MG/250ML-% IV SOLN
INTRAVENOUS | Status: DC | PRN
Start: 2022-01-03 — End: 2022-01-03
  Administered 2022-01-03: 50 ug/min via INTRAVENOUS

## 2022-01-03 SURGICAL SUPPLY — 54 items
BAG COUNTER SPONGE SURGICOUNT (BAG) ×2 IMPLANT
BIT DRILL INTERTAN LAG SCREW (BIT) ×1 IMPLANT
BIT DRILL SHORT 4.0 (BIT) IMPLANT
BLADE CLIPPER SURG (BLADE) IMPLANT
BLADE SURG 15 STRL LF DISP TIS (BLADE) ×1 IMPLANT
BLADE SURG 15 STRL SS (BLADE) ×2
COVER PERINEAL POST (MISCELLANEOUS) ×2 IMPLANT
COVER SURGICAL LIGHT HANDLE (MISCELLANEOUS) ×2 IMPLANT
DRAPE HALF SHEET 40X57 (DRAPES) ×1 IMPLANT
DRAPE ORTHO SPLIT 77X108 STRL (DRAPES) ×4
DRAPE STERI IOBAN 125X83 (DRAPES) ×2 IMPLANT
DRAPE SURG ORHT 6 SPLT 77X108 (DRAPES) IMPLANT
DRILL BIT SHORT 4.0 (BIT) ×2
DRSG AQUACEL AG ADV 3.5X 6 (GAUZE/BANDAGES/DRESSINGS) ×2 IMPLANT
DRSG MEPILEX BORDER 4X4 (GAUZE/BANDAGES/DRESSINGS) IMPLANT
DRSG MEPILEX BORDER 4X8 (GAUZE/BANDAGES/DRESSINGS) ×2 IMPLANT
DRSG TEGADERM 2-3/8X2-3/4 SM (GAUZE/BANDAGES/DRESSINGS) ×1 IMPLANT
DURAPREP 26ML APPLICATOR (WOUND CARE) ×2 IMPLANT
ELECT REM PT RETURN 9FT ADLT (ELECTROSURGICAL) ×2
ELECTRODE REM PT RTRN 9FT ADLT (ELECTROSURGICAL) ×1 IMPLANT
GAUZE SPONGE 4X4 12PLY STRL (GAUZE/BANDAGES/DRESSINGS) ×1 IMPLANT
GAUZE XEROFORM 5X9 LF (GAUZE/BANDAGES/DRESSINGS) ×2 IMPLANT
GLOVE SRG 8 PF TXTR STRL LF DI (GLOVE) ×1 IMPLANT
GLOVE SURG LTX SZ8 (GLOVE) ×2 IMPLANT
GLOVE SURG UNDER POLY LF SZ8 (GLOVE) ×2
GOWN STRL REUS W/ TWL LRG LVL3 (GOWN DISPOSABLE) ×1 IMPLANT
GOWN STRL REUS W/ TWL XL LVL3 (GOWN DISPOSABLE) IMPLANT
GOWN STRL REUS W/TWL LRG LVL3 (GOWN DISPOSABLE) ×6
GOWN STRL REUS W/TWL XL LVL3 (GOWN DISPOSABLE) ×2
GUIDE PIN 3.2X343 (PIN) ×2
GUIDE PIN 3.2X343MM (PIN) ×4
KIT BASIN OR (CUSTOM PROCEDURE TRAY) ×2 IMPLANT
KIT TURNOVER KIT B (KITS) ×2 IMPLANT
MANIFOLD NEPTUNE II (INSTRUMENTS) ×2 IMPLANT
NAIL TRIGEN INTERT 11.5X40-125 (Nail) ×1 IMPLANT
NS IRRIG 1000ML POUR BTL (IV SOLUTION) ×2 IMPLANT
PACK GENERAL/GYN (CUSTOM PROCEDURE TRAY) ×2 IMPLANT
PAD ARMBOARD 7.5X6 YLW CONV (MISCELLANEOUS) ×4 IMPLANT
PIN GUIDE 3.2X343MM (PIN) IMPLANT
SCREW LAG COMPR KIT 100/95 (Screw) ×1 IMPLANT
SCREW TRIGEN LOW PROF 5.0X40 (Screw) ×1 IMPLANT
SCREW TRIGEN LOW PROF 5.0X47.5 (Screw) ×1 IMPLANT
STAPLER VISISTAT 35W (STAPLE) ×2 IMPLANT
STRIP CLOSURE SKIN 1/2X4 (GAUZE/BANDAGES/DRESSINGS) ×1 IMPLANT
SUT ETHILON 3 0 PS 1 (SUTURE) ×1 IMPLANT
SUT MNCRL AB 3-0 PS2 18 (SUTURE) ×1 IMPLANT
SUT VIC AB 0 CT1 27 (SUTURE) ×4
SUT VIC AB 0 CT1 27XBRD ANBCTR (SUTURE) IMPLANT
SUT VIC AB 1 CT1 27 (SUTURE) ×6
SUT VIC AB 1 CT1 27XBRD ANBCTR (SUTURE) IMPLANT
SUT VIC AB 2-0 CTB1 (SUTURE) ×3 IMPLANT
TOWEL GREEN STERILE (TOWEL DISPOSABLE) ×2 IMPLANT
TOWEL GREEN STERILE FF (TOWEL DISPOSABLE) ×2 IMPLANT
WATER STERILE IRR 1000ML POUR (IV SOLUTION) ×2 IMPLANT

## 2022-01-03 NOTE — Anesthesia Procedure Notes (Signed)
Procedure Name: Intubation ?Date/Time: 01/03/2022 5:13 PM ?Performed by: Annamary Carolin, CRNA ?Pre-anesthesia Checklist: Patient identified, Emergency Drugs available, Suction available and Patient being monitored ?Patient Re-evaluated:Patient Re-evaluated prior to induction ?Oxygen Delivery Method: Circle System Utilized ?Preoxygenation: Pre-oxygenation with 100% oxygen ?Induction Type: IV induction ?Ventilation: Mask ventilation without difficulty ?Laryngoscope Size: Mac and 3 ?Grade View: Grade I ?Tube type: Oral ?Tube size: 7.5 mm ?Number of attempts: 1 ?Airway Equipment and Method: Stylet ?Placement Confirmation: ETT inserted through vocal cords under direct vision, positive ETCO2 and breath sounds checked- equal and bilateral ?Secured at: 22 cm ?Tube secured with: Tape ?Dental Injury: Teeth and Oropharynx as per pre-operative assessment  ?Comments: With ease x1 ? ? ? ? ?

## 2022-01-03 NOTE — Anesthesia Postprocedure Evaluation (Signed)
Anesthesia Post Note ? ?Patient: Jeffrey Stephens ? ?Procedure(s) Performed: INTRAMEDULLARY (IM) NAIL INTERTROCHANTRIC; ASPIRATION OF RIGHT KNEE EFFUSION AND INJECTION. (Right) ? ?  ? ?Patient location during evaluation: PACU ?Anesthesia Type: General ?Level of consciousness: awake and alert ?Pain management: pain level controlled ?Vital Signs Assessment: post-procedure vital signs reviewed and stable ?Respiratory status: spontaneous breathing, nonlabored ventilation, respiratory function stable and patient connected to face mask oxygen ?Cardiovascular status: tachycardic ?Postop Assessment: no apparent nausea or vomiting ?Anesthetic complications: no ?Comments: Patient confused in PACU. HR ST 120-130. Albumin given, started on phenylephrine infusion to improve MAPs. Requires higher level of care postoperatively, ICU consult placed. ? ? ?No notable events documented. ? ?Last Vitals:  ?Vitals:  ? 01/03/22 2100 01/03/22 2115  ?BP: 99/73 (!) 67/48  ?Pulse: 96 (!) 133  ?Resp: 17 18  ?Temp:    ?SpO2: 96% 95%  ?  ?Last Pain:  ?Vitals:  ? 01/03/22 2030  ?TempSrc:   ?PainSc: Asleep  ? ? ?  ?  ?  ?  ?  ?  ? ?Audry Pili ? ? ? ? ?

## 2022-01-03 NOTE — Brief Op Note (Signed)
? ?  01/03/2022 ? ?8:05 PM ? ?PATIENT:  Jeffrey Stephens  81 y.o. male ? ?PRE-OPERATIVE DIAGNOSIS:  right hip intertrochantric fracture ? ?POST-OPERATIVE DIAGNOSIS:  right hip intertrochantric fracture subtrochanteric fracture ? ?PROCEDURE:  Procedure(s): ?INTRAMEDULLARY (IM) NAIL INTERTROCHANTRIC; ASPIRATION OF RIGHT KNEE EFFUSION AND INJECTION. ? ?SURGEON:  Surgeon(s): ?Marlou Sa, Tonna Corner, MD ? ?ASSISTANT: Annie Main, PA ? ?ANESTHESIA:   general ? ?EBL:100 ml   ? ?Total I/O ?In: 250 [IV Piggyback:250] ?Out: 200 [Urine:150; Blood:50] ? ?BLOOD ADMINISTERED: none ? ?DRAINS: none  ? ?LOCAL MEDICATIONS USED: Marcaine morphine clonidine ? ?SPECIMEN:  No Specimen ? ?COUNTS:  YES ? ?TOURNIQUET:  * No tourniquets in log * ? ?DICTATION: . ?[id number]: ? ?PLAN OF CARE: Admit to inpatient  ? ?PATIENT DISPOSITION:  PACU - hemodynamically stable ? ? ? ? ? ? ? ? ? ? ? ? ?  ?

## 2022-01-03 NOTE — Op Note (Signed)
NAME: Jeffrey Stephens, AGER ?MEDICAL RECORD NO: [id number] ?ACCOUNT NO: [id number] ?DATE OF BIRTH: 01-Dec-1940 ?FACILITY: [facility] ?LOCATION: [facility] ?PHYSICIAN: Yetta Barre. Marlou Sa, MD ? ?Operative Report  ? ?DATE OF PROCEDURE: 01/03/2022 ? ?PREOPERATIVE DIAGNOSES:  Right hip intertrochanteric fracture and right knee effusion. ? ?POSTOPERATIVE DIAGNOSES:  Right hip intertrochanteric fracture and right knee effusion. ? ?PROCEDURE:  Right hip intertrochanteric/subtrochanteric fracture fixation with reduction and intramedullary nailing using 11.5 x 40 mm InterTAN nail with right knee aspiration of approximately 30 mL of serous fluid and injection of bupivacaine into the  ?knee. ? ?SURGEON:  Yetta Barre. Marlou Sa, MD ? ?ASSISTANT:  Annie Main. ? ?INDICATIONS:  The patient is a 81 year old patient who injured his right femur with a fall.  He presents for operative management after explanation of risks and benefits.  He has an intertrochanteric fracture with lesser trochanteric avulsion turning  ?this into somewhat of a subtrochanteric fracture. ? ?DESCRIPTION OF PROCEDURE:  The patient was brought to the operating room where general anesthetic was induced.  Preoperative antibiotics were administered.  Left leg placed in lithotomy position.  Right leg placed under traction.  Timeout was called.   ?Attempted to reduce the fracture with traction and internal rotation, but it did not work.  There was significant displacement at the fracture site.  After sterile prepping and draping, the area after prescrubbing with alcohol, Betadine, and then prepped ? with DuraPrep, wall drape was utilized.  A crutch was placed under the part of the femur just distal to the fracture site.  Then, after calling timeout, incision made over the trochanteric ridge.  Skin and subcutaneous tissue sharply divided.  Fascia  ?lata was divided.  Cobb elevator was placed and the fracture was reduced.  Guide pin was then placed through the tip of the trochanter, which  was more posterior than normal due to the subtrochanteric nature of the fracture.  Guide pin was placed and  ?proximal reaming was performed.  With the fracture reduced manually with guidepin placement and with fracture reduction, the nail was placed across the fracture site with good reduction achieved.  Compression screw and lag screw were placed in the  ?inferior portion of the femoral neck. We did achieve about 13 mm of compression because the fracture was significantly displaced.  Next, we put one distal interlocking screw for rotational stability.  Overall, the construct was stable.  Luke's assistance ? was required at all times for retraction, opening, closing, mobilization of tissue, putting in the nail, putting in the screws required for fixation.  His assistance was a medical necessity. We did anesthetize all incisions with Marcaine, morphine,  ?clonidine at the end of the case.  In addition, we aspirated the knee after sterile prepping and draping and injected with bupivacaine 10 mL. The patient tolerated all this well without immediate complications. ? ? ?VAI ?D: 01/03/2022 8:10:53 pm T: 01/03/2022 11:13:00 pm  ?JOB: [id number]/ [id number]  ?

## 2022-01-03 NOTE — Transfer of Care (Signed)
Immediate Anesthesia Transfer of Care Note ? ?Patient: Jeffrey Stephens ? ?Procedure(s) Performed: INTRAMEDULLARY (IM) NAIL INTERTROCHANTRIC; ASPIRATION OF RIGHT KNEE EFFUSION AND INJECTION. (Right) ? ?Patient Location: PACU ? ?Anesthesia Type:General ? ?Level of Consciousness: drowsy ? ?Airway & Oxygen Therapy: Patient Spontanous Breathing and Patient connected to face mask oxygen ? ?Post-op Assessment: Report given to RN, Post -op Vital signs reviewed and unstable, Anesthesiologist notified and Patient moving all extremities ? ?Post vital signs: Reviewed and stable ? ?Last Vitals:  ?Vitals Value Taken Time  ?BP 89/56 01/03/22 2011  ?Temp    ?Pulse    ?Resp 24 01/03/22 2015  ?SpO2    ?Vitals shown include unvalidated device data. ? ?Last Pain:  ?Vitals:  ? 01/03/22 1621  ?TempSrc:   ?PainSc: 8   ?   ? ?  ? ?Complications: No notable events documented. ?

## 2022-01-03 NOTE — Progress Notes (Signed)
?PROGRESS NOTE ? ? ? ?Jeffrey Stephens  IOX:[id number] DOB: 1940-10-16 DOA: 01/01/2022 ?PCP: Lavone Orn, MD ? ? ?Brief Narrative:  ?Jeffrey Stephens is a 81 y.o. male with medical history significant of paroxysmal atrial flutter on Eliquis, history of DVT, AAA, aortic atherosclerosis, history of lung cancer status post surgery in 2017, orthostatic hypotension, COPD with emphysema, chronic hypoxic respiratory failure on home oxygen, BPH, depression, GERD, cerebral aneurysm rupture status post repair surgery in 1994.  ? ?Patient presented to the ED complaining of right hip pain after a mechanical fall with subsequent right hip pain, imaging in the ED shows comminuted intertrochanteric proximal right femoral fracture.  Ortho consulted at intake, hospitalist called for admission.   ? ? ?Assessment & Plan: ?  ?Principal Problem: ?  Hip fracture ([place]) ?Active Problems: ?  Paroxysmal atrial flutter ([place]) ?  COPD with emphysema ([place]) ?  Leukocytosis ?  Normocytic anemia ?  History of DVT (deep vein thrombosis) ? ?Right comminuted intertrochanteric proximal femur fracture ?Secondary to mechanical fall ambulatory dysfunction, acute on chronic ?Orthopedics following, surgery pending 3 PM today ?Pain currently well controlled on OxyContin IR p.o. and Dilaudid IV ?Zofran for any breakthrough nausea secondary to pain or narcotics ?  ?Normocytic anemia ?Concurrently noted subacute versus chronic hematuria ?Scant hematuria noted at intake followed outpatient by urology ?No other signs or symptoms of bleeding ?Continue to follow trend ?  ?Leukocytosis, likely reactive ?Continue to follow clinically -no sources of infection at this time ? ?COPD with emphysema ([place]), not in acute exacerbation ?Chronic hypoxia, wears oxygen "as needed" at home -unclear oxygen flow rate ?Continue to wean oxygen as tolerated while admitted, ambulatory oxygen screening pending surgery as above ?  ?Paroxysmal atrial flutter ([place]), rate controlled ?Not  currently on any rate control medication, unable to tolerate sotalol in the outpatient setting due to bradycardia  ?Hold Eliquis at this time perioperatively ? ?History of DVT ?Remote-on Eliquis as below for a flutter ?  ?DVT prophylaxis: SCDs -on Eliquis outpatient currently on hold perioperatively ?Code Status: Full Code (discussed with the patient) ?Family Communication: Patient's wife and daughter at bedside. ? ?Status is: Inpatient ? ?Dispo: The patient is from: Home ?             Anticipated d/c is to: To be determined ?             Anticipated d/c date is: 48 to 72 hours ?             Patient currently not medically stable for discharge ? ?Consultants:  ?Orthopedics ? ?Procedures:  ?ORIF right femur 01/03/2022 ? ?Antimicrobials:  ?None indicated ? ?Subjective: ?No acute issues or events overnight denies nausea vomiting diarrhea constipation headache fevers chills or chest pain, right hip pain ongoing but markedly improving, mild nausea but no more episodes of vomiting over the past 12 hours ? ?Objective: ?Vitals:  ? 01/02/22 1242 01/02/22 1305 01/02/22 1927 01/03/22 0425  ?BP:  131/73 111/68 117/75  ?Pulse:  99 97 91  ?Resp:  19 18 20   ?Temp: 98.2 ?F (36.8 ?C) 98.2 ?F (36.8 ?C) 99.1 ?F (37.3 ?C) 98.9 ?F (37.2 ?C)  ?TempSrc: Oral Oral Oral   ?SpO2:  98% 91% 93%  ?Weight:  104 kg    ?Height:  5\' 9"  (1.753 m)    ? ?No intake or output data in the 24 hours ending 01/03/22 0721 ? ?Filed Weights  ? 01/02/22 1305  ?Weight: 104 kg  ? ? ?Examination: ? ?General:  Pleasantly resting in bed, mild distress secondary to pain and nausea ?Lungs:  Clear to auscultate bilaterally without rhonchi, wheeze, or rales. ?Heart:  Regular rate and rhythm.  Without murmurs, rubs, or gallops. ?Abdomen:  Soft, nontender, nondistended.  Without guarding or rebound. ? ?Data Reviewed: I have personally reviewed following labs and imaging studies ? ?CBC: ?Recent Labs  ?Lab 01/02/22 ?0118 01/02/22 ?1540 01/03/22 ?0456  ?WBC 15.2* 15.6* 14.1*   ?NEUTROABS 13.1*  --   --   ?HGB 12.8* 11.7* 10.4*  ?HCT 39.2 36.1* 32.5*  ?MCV 90.5 90.7 91.5  ?PLT 223 208 198  ? ? ?Basic Metabolic Panel: ?Recent Labs  ?Lab 01/02/22 ?0118 01/03/22 ?0867  ?NA 140 140  ?K 4.2 4.8  ?CL 109 109  ?CO2 24 25  ?GLUCOSE 160* 127*  ?BUN 13 22  ?CREATININE 1.25* 1.61*  ?CALCIUM 8.8* 8.6*  ? ? ?GFR: ?Estimated Creatinine Clearance: 44.2 mL/min (A) (by C-G formula based on SCr of 1.61 mg/dL (H)). ?Liver Function Tests: ?Recent Labs  ?Lab 01/02/22 ?0118  ?AST 15  ?ALT 11  ?ALKPHOS 41  ?BILITOT 1.0  ?PROT 6.5  ?ALBUMIN 3.4*  ? ? ?No results for input(s): LIPASE, AMYLASE in the last 168 hours. ?No results for input(s): AMMONIA in the last 168 hours. ?Coagulation Profile: ?No results for input(s): INR, PROTIME in the last 168 hours. ?Cardiac Enzymes: ?No results for input(s): CKTOTAL, CKMB, CKMBINDEX, TROPONINI in the last 168 hours. ?BNP (last 3 results) ?No results for input(s): PROBNP in the last 8760 hours. ?HbA1C: ?No results for input(s): HGBA1C in the last 72 hours. ?CBG: ?No results for input(s): GLUCAP in the last 168 hours. ?Lipid Profile: ?No results for input(s): CHOL, HDL, LDLCALC, TRIG, CHOLHDL, LDLDIRECT in the last 72 hours. ?Thyroid Function Tests: ?No results for input(s): TSH, T4TOTAL, FREET4, T3FREE, THYROIDAB in the last 72 hours. ?Anemia Panel: ?No results for input(s): VITAMINB12, FOLATE, FERRITIN, TIBC, IRON, RETICCTPCT in the last 72 hours. ?Sepsis Labs: ?No results for input(s): PROCALCITON, LATICACIDVEN in the last 168 hours. ? ?Recent Results (from the past 240 hour(s))  ?SARS CORONAVIRUS 2 (TAT 6-24 HRS) Nasopharyngeal Nasopharyngeal Swab     Status: None  ? Collection Time: 01/02/22  5:54 AM  ? Specimen: Nasopharyngeal Swab  ?Result Value Ref Range Status  ? SARS Coronavirus 2 NEGATIVE NEGATIVE Final  ?  Comment: (NOTE) ?SARS-CoV-2 target nucleic acids are NOT DETECTED. ? ?The SARS-CoV-2 RNA is generally detectable in upper and lower ?respiratory specimens during  the acute phase of infection. Negative ?results do not preclude SARS-CoV-2 infection, do not rule out ?co-infections with other pathogens, and should not be used as the ?sole basis for treatment or other patient management decisions. ?Negative results must be combined with clinical observations, ?patient history, and epidemiological information. The expected ?result is Negative. ? ?Fact Sheet for Patients: ?SugarRoll.be ? ?Fact Sheet for Healthcare Providers: ?https://www.woods-mathews.com/ ? ?This test is not yet approved or cleared by the [place] FDA and  ?has been authorized for detection and/or diagnosis of SARS-CoV-2 by ?FDA under an Emergency Use Authorization (EUA). This EUA will remain  ?in effect (meaning this test can be used) for the duration of the ?COVID-19 declaration under Se ction 564(b)(1) of the Act, 21 U.S.C. ?section 360bbb-3(b)(1), unless the authorization is terminated or ?revoked sooner. ? ?Performed at [facility], [place] [address] [place], [place] ?[address] ?  ?Surgical pcr screen     Status: None  ? Collection Time: 01/03/22  3:33 AM  ?  Specimen: Nasal Mucosa; Nasal Swab  ?Result Value Ref Range Status  ? MRSA, PCR NEGATIVE NEGATIVE Final  ? Staphylococcus aureus NEGATIVE NEGATIVE Final  ?  Comment: (NOTE) ?The Xpert SA Assay (FDA approved for NASAL specimens in patients 66 ?years of age and older), is one component of a comprehensive ?surveillance program. It is not intended to diagnose infection nor to ?guide or monitor treatment. ?Performed at [facility], [place] [address], [place], [place] ?[address] ?  ?  ? ? ? ? ? ?Radiology Studies: ?DG Chest 1 View ? ?Result Date: 01/02/2022 ?CLINICAL DATA:  Fall injury with right hip fracture, preoperative chest evaluation, right knee pain. EXAM: DG HIP (WITH OR WITHOUT PELVIS) 2-3V RIGHT; RIGHT KNEE - COMPLETE 4+ VIEW; CHEST 1 VIEW COMPARISON:  PA and lateral chest 06/07/2016, CT abdomen and  pelvis and reconstructions 12/19/2021. No prior available right knee series. FINDINGS: Chest: A single supine AP chest film was obtained at 1:20 a.m., 01/02/2022. There is tangle of overlying monitor wir

## 2022-01-03 NOTE — Progress Notes (Signed)
Patient also has right knee effusion but no fracture on radiographs.  We will plan to aspirate the knee and inject with Marcaine morphine and clonidine for postop pain relief. ?We will also use that around the hip incisions ?

## 2022-01-03 NOTE — Progress Notes (Signed)
Initial Nutrition Assessment ? ?DOCUMENTATION CODES:  ? ?Not applicable ? ?INTERVENTION:  ? ?Multivitamin w/ minerals daily ?Ensure Enlive po BID, each supplement provides 350 kcal and 20 grams of protein. ? ?NUTRITION DIAGNOSIS:  ? ?Increased nutrient needs related to hip fracture as evidenced by estimated needs. ? ?GOAL:  ? ?Patient will meet greater than or equal to 90% of their needs ? ?MONITOR:  ? ?PO intake, Supplement acceptance, Labs ? ?REASON FOR ASSESSMENT:  ? ?Consult ?Assessment of nutrition requirement/status ? ?ASSESSMENT:  ? ?81 y.o. male presented to the ED with R hip pain after a fall. PMH includes COPD, lung cancer s/p surgery, GERD, and TIA. Pt admitted with hip fracture. ? ?Pt resting in bed at time of RD visit. Family at bedside.  ?Pt currently NPO for repair of hip fracture this afternoon.  ? ?Pt reports that his appetite has been ok. Reports that he does not eat much. States that he eats either a breakfast sandwich or toast for breakfast. Then will have a late lunch/early dinner and have steak and potatoes or veggies and eats fish 2 times per week. Pt reports that he does not drink any ONS at home. Denies any nausea or vomiting.  ? ?Pt unable to provide UBW, but reports that he has lost a couple pounds over a longer period of time. Per EMR, pt has not had any significant weight loss within the past year.  ? ?Discussed the importance of adequate nutrition to help with healing post-op. Pt in agreeable to ONS after surgery and diet is advanced.  ? ?Pt and family with no other questions or concerns at this time.  ? ?Medications reviewed. ?Labs reviewed: Creatinine 1.61 ? ?NUTRITION - FOCUSED PHYSICAL EXAM: ? ?Flowsheet Row Most Recent Value  ?Orbital Region No depletion  ?Upper Arm Region No depletion  ?Thoracic and Lumbar Region No depletion  ?Buccal Region No depletion  ?Temple Region No depletion  ?Clavicle Bone Region No depletion  ?Clavicle and Acromion Bone Region No depletion  ?Scapular  Bone Region No depletion  ?Dorsal Hand No depletion  ?Patellar Region Mild depletion  ?Anterior Thigh Region Mild depletion  ?Posterior Calf Region Mild depletion  ?Edema (RD Assessment) Mild  ?Hair Reviewed  ?Eyes Reviewed  ?Mouth Reviewed  ?Skin Reviewed  ?Nails Reviewed  ? ?Diet Order:   ?Diet Order   ? ?       ?  Diet NPO time specified Except for: Sips with Meds  Diet effective midnight       ?  ? ?  ?  ? ?  ? ? ?EDUCATION NEEDS:  ? ?No education needs have been identified at this time ? ?Skin:  Skin Assessment: Reviewed RN Assessment ? ?Last BM:  No Documentation ? ?Height:  ? ?Ht Readings from Last 1 Encounters:  ?01/03/22 5\' 9"  (1.753 m)  ? ? ?Weight:  ? ?Wt Readings from Last 1 Encounters:  ?01/03/22 104.3 kg  ? ? ?Ideal Body Weight:  72.7 kg ? ?BMI:  Body mass index is 33.97 kg/m?. ? ?Estimated Nutritional Needs:  ? ?Kcal:  2100-2300 ? ?Protein:  105-120 grams ? ?Fluid:  >/= 2.1 L ? ? ? ?Hermina Barters RD, LDN ?Clinical Dietitian ?See AMiON for contact information.  ? ?

## 2022-01-03 NOTE — Anesthesia Preprocedure Evaluation (Signed)
Anesthesia Evaluation  ?Patient identified by MRN, date of birth, ID band ?Patient awake ? ? ? ?Reviewed: ?Allergy & Precautions, NPO status , Patient's Chart, lab work & pertinent test results ? ?Airway ?Mallampati: II ? ?TM Distance: >3 FB ?Neck ROM: Full ? ? ? Dental ?no notable dental hx. ? ?  ?Pulmonary ?COPD, former smoker,  ?  ?Pulmonary exam normal ?breath sounds clear to auscultation ? ? ? ? ? ? Cardiovascular ?Normal cardiovascular exam+ dysrhythmias Atrial Fibrillation  ?Rhythm:Regular Rate:Normal ? ? ?  ?Neuro/Psych ?Depression CVA negative psych ROS  ? GI/Hepatic ?Neg liver ROS, GERD  ,  ?Endo/Other  ?negative endocrine ROS ? Renal/GU ?Renal InsufficiencyRenal disease  ?negative genitourinary ?  ?Musculoskeletal ? ?(+) Arthritis , Osteoarthritis,   ? Abdominal ?(+) + obese,   ?Peds ?negative pediatric ROS ?(+)  Hematology ?negative hematology ROS ?(+)   ?Anesthesia Other Findings ? ? Reproductive/Obstetrics ?negative OB ROS ? ?  ? ? ? ? ? ? ? ? ? ? ? ? ? ?  ?  ? ? ? ? ? ? ? ? ?Anesthesia Physical ?Anesthesia Plan ? ?ASA: 3 ? ?Anesthesia Plan: General  ? ?Post-op Pain Management:   ? ?Induction: Intravenous ? ?PONV Risk Score and Plan: 2 and Ondansetron, Midazolam and Treatment may vary due to age or medical condition ? ?Airway Management Planned: Oral ETT ? ?Additional Equipment:  ? ?Intra-op Plan:  ? ?Post-operative Plan: Extubation in OR ? ?Informed Consent: I have reviewed the patients History and Physical, chart, labs and discussed the procedure including the risks, benefits and alternatives for the proposed anesthesia with the patient or authorized representative who has indicated his/her understanding and acceptance.  ? ? ? ?Dental advisory given ? ?Plan Discussed with: CRNA ? ?Anesthesia Plan Comments:   ? ? ? ? ? ? ?Anesthesia Quick Evaluation ? ?

## 2022-01-04 DIAGNOSIS — S72001A Fracture of unspecified part of neck of right femur, initial encounter for closed fracture: Secondary | ICD-10-CM | POA: Diagnosis not present

## 2022-01-04 DIAGNOSIS — J431 Panlobular emphysema: Secondary | ICD-10-CM

## 2022-01-04 DIAGNOSIS — I4892 Unspecified atrial flutter: Secondary | ICD-10-CM | POA: Diagnosis not present

## 2022-01-04 LAB — CBC
HCT: 25.9 % — ABNORMAL LOW (ref 39.0–52.0)
Hemoglobin: 8.4 g/dL — ABNORMAL LOW (ref 13.0–17.0)
MCH: 29.8 pg (ref 26.0–34.0)
MCHC: 32.4 g/dL (ref 30.0–36.0)
MCV: 91.8 fL (ref 80.0–100.0)
Platelets: 157 10*3/uL (ref 150–400)
RBC: 2.82 MIL/uL — ABNORMAL LOW (ref 4.22–5.81)
RDW: 16.7 % — ABNORMAL HIGH (ref 11.5–15.5)
WBC: 16.6 10*3/uL — ABNORMAL HIGH (ref 4.0–10.5)
nRBC: 0 % (ref 0.0–0.2)

## 2022-01-04 MED ORDER — SODIUM CHLORIDE 0.9 % IV SOLN: INTRAVENOUS | Status: DC

## 2022-01-04 MED ORDER — OXYCODONE HCL 5 MG PO TABS
5.0000 mg | ORAL_TABLET | ORAL | Status: DC | PRN
  Administered 2022-01-05 – 2022-01-08 (×4): 10 mg via ORAL
  Filled 2022-01-04 (×2): qty 2
  Filled 2022-01-04: qty 1
  Filled 2022-01-04 (×2): qty 2
  Filled 2022-01-04: qty 1

## 2022-01-04 MED ORDER — ACETAMINOPHEN 325 MG PO TABS
325.0000 mg | ORAL_TABLET | Freq: Four times a day (QID) | ORAL | Status: DC | PRN
  Filled 2022-01-04: qty 2

## 2022-01-04 MED ORDER — ACETAMINOPHEN 500 MG PO TABS
1000.0000 mg | ORAL_TABLET | Freq: Four times a day (QID) | ORAL | Status: AC
  Administered 2022-01-04 (×4): 1000 mg via ORAL
  Filled 2022-01-04 (×4): qty 2

## 2022-01-04 MED ORDER — SODIUM CHLORIDE 0.9 % IV BOLUS: 1000.0000 mL | Freq: Once | INTRAVENOUS | Status: DC

## 2022-01-04 MED ORDER — SODIUM CHLORIDE 0.9 % IV BOLUS
1000.0000 mL | Freq: Once | INTRAVENOUS | Status: AC
  Administered 2022-01-04: 1000 mL via INTRAVENOUS

## 2022-01-04 MED ORDER — DOCUSATE SODIUM 100 MG PO CAPS
100.0000 mg | ORAL_CAPSULE | Freq: Two times a day (BID) | ORAL | Status: DC
  Administered 2022-01-04 – 2022-01-06 (×5): 100 mg via ORAL
  Filled 2022-01-04 (×5): qty 1

## 2022-01-04 MED ORDER — METOPROLOL TARTRATE 5 MG/5ML IV SOLN
5.0000 mg | Freq: Four times a day (QID) | INTRAVENOUS | Status: DC | PRN
  Administered 2022-01-04 – 2022-01-05 (×3): 5 mg via INTRAVENOUS
  Filled 2022-01-04 (×3): qty 5

## 2022-01-04 MED ORDER — ONDANSETRON HCL 4 MG PO TABS: 4.0000 mg | ORAL_TABLET | Freq: Four times a day (QID) | ORAL | Status: DC | PRN

## 2022-01-04 MED ORDER — APIXABAN 5 MG PO TABS
5.0000 mg | ORAL_TABLET | Freq: Two times a day (BID) | ORAL | Status: DC
  Administered 2022-01-04 – 2022-01-08 (×8): 5 mg via ORAL
  Filled 2022-01-04 (×8): qty 1

## 2022-01-04 MED ORDER — DILTIAZEM HCL 30 MG PO TABS
60.0000 mg | ORAL_TABLET | Freq: Four times a day (QID) | ORAL | Status: DC
  Administered 2022-01-04 – 2022-01-06 (×7): 60 mg via ORAL
  Filled 2022-01-04 (×8): qty 2

## 2022-01-04 MED ORDER — SODIUM CHLORIDE 0.9 % IV BOLUS
500.0000 mL | Freq: Once | INTRAVENOUS | Status: AC
  Administered 2022-01-04: 500 mL via INTRAVENOUS

## 2022-01-04 MED ORDER — ONDANSETRON HCL 4 MG/2ML IJ SOLN: 4.0000 mg | Freq: Four times a day (QID) | INTRAMUSCULAR | Status: DC | PRN

## 2022-01-04 MED ORDER — PHENOL 1.4 % MT LIQD: 1.0000 | OROMUCOSAL | Status: DC | PRN

## 2022-01-04 MED ORDER — HYDROMORPHONE HCL 2 MG PO TABS
1.0000 mg | ORAL_TABLET | ORAL | Status: DC | PRN
  Administered 2022-01-04: 2 mg via ORAL
  Filled 2022-01-04: qty 1

## 2022-01-04 MED ORDER — TRANEXAMIC ACID-NACL 1000-0.7 MG/100ML-% IV SOLN
1000.0000 mg | Freq: Once | INTRAVENOUS | Status: AC
  Administered 2022-01-04: 1000 mg via INTRAVENOUS
  Filled 2022-01-04: qty 100

## 2022-01-04 MED ORDER — MENTHOL 3 MG MT LOZG: 1.0000 | LOZENGE | OROMUCOSAL | Status: DC | PRN

## 2022-01-04 MED ORDER — HYDROMORPHONE HCL 1 MG/ML IJ SOLN: 0.5000 mg | INTRAMUSCULAR | Status: DC | PRN

## 2022-01-04 MED ORDER — METOCLOPRAMIDE HCL 5 MG/ML IJ SOLN: 5.0000 mg | Freq: Three times a day (TID) | INTRAMUSCULAR | Status: DC | PRN

## 2022-01-04 MED ORDER — METOCLOPRAMIDE HCL 5 MG PO TABS: 5.0000 mg | ORAL_TABLET | Freq: Three times a day (TID) | ORAL | Status: DC | PRN

## 2022-01-04 MED ORDER — CEFAZOLIN SODIUM-DEXTROSE 2-4 GM/100ML-% IV SOLN
2.0000 g | Freq: Three times a day (TID) | INTRAVENOUS | Status: AC
  Administered 2022-01-04 (×2): 2 g via INTRAVENOUS
  Filled 2022-01-04 (×2): qty 100

## 2022-01-04 NOTE — Progress Notes (Signed)
?   01/04/22 0010  ?Assess: MEWS Score  ?Temp 98.2 ?F (36.8 ?C)  ?BP 109/61  ?Pulse Rate (!) 129  ?ECG Heart Rate (!) 131  ?Resp 10  ?SpO2 92 %  ?Assess: MEWS Score  ?MEWS Temp 0  ?MEWS Systolic 0  ?MEWS Pulse 3  ?MEWS RR 1  ?MEWS LOC 0  ?MEWS Score 4  ?MEWS Score Color Red  ?Assess: if the MEWS score is Yellow or Red  ?Were vital signs taken at a resting state? Yes  ?Focused Assessment No change from prior assessment  ?Early Detection of Sepsis Score *See Row Information* Medium  ?MEWS guidelines implemented *See Row Information* No, previously red, continue vital signs every 4 hours  ?Treat  ?MEWS Interventions Other (Comment) ?(MD notified)  ?Take Vital Signs  ?Increase Vital Sign Frequency  Red: Q 1hr X 4 then Q 4hr X 4, if remains red, continue Q 4hrs  ?Escalate  ?MEWS: Escalate Red: discuss with charge nurse/RN and provider, consider discussing with RRT  ?Notify: Charge Nurse/RN  ?Name of Charge Nurse/RN Notified Blanch Media, RN  ?Date Charge Nurse/RN Notified 01/04/22  ?Time Charge Nurse/RN Notified 0015  ?Notify: Provider  ?Provider Name/Title Marlowe Sax, MD  ?Date Provider Notified 01/04/22  ?Time Provider Notified [phone]  ?Notification Type Page  ?Notification Reason Other (Comment) ?(red MEWS)  ?Provider response At bedside  ?Date of Provider Response 01/04/22  ?Time of Provider Response 0015  ?Document  ?Patient Outcome Other (Comment) ?(HR still elevated, pt asymptomatic)  ?Progress note created (see row info) Yes  ? ? ?

## 2022-01-04 NOTE — Progress Notes (Signed)
?  Subjective: ?Jeffrey Stephens is a 81 y.o. male s/p right hip IM nail.  They are POD 1.  Pt's pain is controlled and much better than it was yesterday.  Denies any chest pain or shortness of breath.  No abdominal pain.  Denies any calf pain as well.  Feels well overall but just feels tired and hungry for breakfast. ? ?Objective: ?Vital signs in last 24 hours: ?Temp:  [98 ?F (36.7 ?C)-98.9 ?F (37.2 ?C)] 98.3 ?F (36.8 ?C) (04/12 0645) ?Pulse Rate:  [96-145] 131 (04/12 0720) ?Resp:  [10-31] 15 (04/12 0720) ?BP: (67-144)/(48-75) 94/60 (04/12 0720) ?SpO2:  [89 %-100 %] 93 % (04/12 0720) ?Weight:  [104.3 kg] 104.3 kg (04/11 1448) ? ?Intake/Output from previous day: ?04/11 0701 - 04/12 0700 ?In: 1256.6 [I.V.:1006.6; IV Piggyback:250] ?Out: 600 [Urine:350; Blood:250] ?Intake/Output this shift: ?No intake/output data recorded. ? ?Exam: ? ?No gross blood or drainage overlying the dressings ?2+ DP pulse ?Sensation intact distally in the right foot ?Able to dorsiflex and plantarflex the right ankle ? ? ?Labs: ?Recent Labs  ?  01/02/22 ?0118 01/02/22 ?2458 01/03/22 ?0456 01/04/22 ?0040  ?HGB 12.8* 11.7* 10.4* 8.4*  ? ?Recent Labs  ?  01/03/22 ?0456 01/04/22 ?0040  ?WBC 14.1* 16.6*  ?RBC 3.55* 2.82*  ?HCT 32.5* 25.9*  ?PLT 198 157  ? ?Recent Labs  ?  01/02/22 ?0118 01/03/22 ?0456  ?NA 140 140  ?K 4.2 4.8  ?CL 109 109  ?CO2 24 25  ?BUN 13 22  ?CREATININE 1.25* 1.61*  ?GLUCOSE 160* 127*  ?CALCIUM 8.8* 8.6*  ? ?No results for input(s): LABPT, INR in the last 72 hours. ? ?Assessment/Plan: ?Pt is POD 1 s/p right hip IM nail.   ? -Disposition pending medical team clearance and decision ? -Nonweightbearing to right lower extremity, okay for touchdown weightbearing for transfers only ? -DVT Prophylaxis: SCDs and eventually restart Eliquis.  Currently his hemoglobin is 8.4 and he has elevated heart rate that is being managed by hospitalist and cardiology teams.   ?  ? ? ?Jeffrey Stephens ?01/04/2022, 7:43 AM  ? ? ?   ?

## 2022-01-04 NOTE — TOC Initial Note (Signed)
Transition of Care (TOC) - Initial/Assessment Note  ? ? ?Patient Details  ?Name: Jeffrey Stephens ?MRN: [id number] ?Date of Birth: June 14, 1941 ? ?Transition of Care [facility]) CM/SW Contact:    ?Sharin Mons, RN ?Phone Number: ?01/04/2022, 3:13 PM ? ?Clinical Narrative:                 ? - S/p Right hip intertrochanteric/subtrochanteric fracture fixation with reduction and intramedullary nailing , 4/11 ? ?From home with wife . PTA independent with ADL's, no DME usage. ? ?PT evaluation pending.... ?  ?TOC team will continue to monitor for needs. ? ?Expected Discharge Plan: Home/Self Care ?Barriers to Discharge: Continued Medical Work up ? ? ?Patient Goals and CMS Choice ?  ?  ?  ? ?Expected Discharge Plan and Services ?Expected Discharge Plan: Home/Self Care ?  ?Discharge Planning Services: CM Consult ?  ?Living arrangements for the past 2 months: [place] ?                ?  ?  ?  ?  ?  ?  ?  ?  ?  ?  ? ?Prior Living Arrangements/Services ?Living arrangements for the past 2 months: [place] ?Lives with:: Spouse ?Patient language and need for interpreter reviewed:: Yes ?Do you feel safe going back to the place where you live?: Yes      ?Need for Family Participation in Patient Care: Yes (Comment) ?Care giver support system in place?: Yes (comment) ?  ?Criminal Activity/Legal Involvement Pertinent to Current Situation/Hospitalization: No - Comment as needed ? ?Activities of Daily Living ?Home Assistive Devices/Equipment: None ?ADL Screening (condition at time of admission) ?Patient's cognitive ability adequate to safely complete daily activities?: Yes ?Is the patient deaf or have difficulty hearing?: No ?Does the patient have difficulty seeing, even when wearing glasses/contacts?: No ?Does the patient have difficulty concentrating, remembering, or making decisions?: No ?Patient able to express need for assistance with ADLs?: Yes ?Does the patient have difficulty dressing or bathing?:  Yes ?Independently performs ADLs?: No ?Does the patient have difficulty walking or climbing stairs?: Yes ?Weakness of Legs: Right ?Weakness of Arms/Hands: None ? ?Permission Sought/Granted ?  ?Permission granted to share information with : Yes, Verbal Permission Granted ? Share Information with NAME: Sheppard Coil (Spouse)   [phone] ?   ?   ?   ? ?Emotional Assessment ?Appearance:: Appears stated age ?  ?  ?Orientation: : Oriented to Self, Oriented to Place, Oriented to  Time, Oriented to Situation ?Alcohol / Substance Use: Not Applicable ?Psych Involvement: No (comment) ? ?Admission diagnosis:  Hip fracture ([place]) [S72.009A] ?Closed fracture of right hip, initial encounter ([place]) [S72.001A] ?Fall in home, initial encounter [W19.XXXA, Y92.009] ?Patient Active Problem List  ? Diagnosis Date Noted  ? Hip fracture ([place]) 01/02/2022  ? Leukocytosis 01/02/2022  ? Normocytic anemia 01/02/2022  ? History of DVT (deep vein thrombosis) 01/02/2022  ? Chronic respiratory failure with hypoxia ([place]) 03/22/2021  ? COPD with emphysema ([place]) 03/03/2020  ? Aortic atherosclerosis ([place]) 08/30/2018  ? Long term current use of anticoagulant 01/18/2017  ? Lung cancer, lower lobe ([place]) 05/08/2016  ? Anticoagulation adequate 08/14/2014  ? Paroxysmal atrial flutter ([place]) 08/12/2014  ? Atrial flutter ([place]) 08/12/2014  ? Abnormal nuclear cardiac imaging test 07/13/2014  ? Coronary artery calcification seen on CT scan 06/17/2014  ? Atrial flutter with rapid ventricular response, converted with sotalol 06/11/2014  ? Left knee DJD 05/14/2014  ? Abdominal aortic aneurysm ([place]) 07/31/2008  ? Pulmonary  nodules 07/31/2008  ? ARTHRITIS, RHEUMATOID 07/31/2008  ? SKIN CANCER, HX OF 07/31/2008  ? NEPHROLITHIASIS, HX OF 07/31/2008  ? BENIGN PROSTATIC HYPERTROPHY, HX OF 07/31/2008  ? ?PCP:  Lavone Orn, MD ?Pharmacy:   ?[id number] [place], [place] - [address] ?[address] ?[place]  [address] ?Phone: [phone] Fax: [phone] ? ? ? ? ?Social Determinants of Health (SDOH) Interventions ?  ? ?Readmission Risk Interventions ?   ? View : No data to display.  ?  ?  ?  ? ? ? ?

## 2022-01-04 NOTE — Progress Notes (Signed)
?PROGRESS NOTE ? ? ? ?Jeffrey Stephens  NKN:[id number] DOB: 29-Oct-1940 DOA: 01/01/2022 ?PCP: Lavone Orn, MD  ? ? ?Brief Narrative:  ?81 year old with paroxysmal a flutter on Eliquis, history of DVT, AAA, history of lung cancer status post lobectomy in 2017, orthostatic hypotension, COPD with emphysema, chronic hypoxic respiratory failure on intermittent home oxygen, depression, GERD presented to the ER with right hip pain after mechanical fall.  He was found to have right intertrochanteric proximal right femur fracture. ?4/12, overnight tachycardia with some drop in blood pressure, 500 mL fluid given.  He does have a history of chronic A-flutter and not able to be on rate control medications due to low blood pressures. ? ? ?Assessment & Plan: ?  ?Close traumatic right intertrochanteric femoral fracture: ?ORIF with IM nailing 4/11, Dr. Marlou Sa ?DVT prophylaxis, will start Eliquis when okay with surgery, currently on SCDs. ?Pain management with IV and oral opiates, will need more pain medications today to work with therapies. ?Nonweightbearing right lower extremity. ? ?Acute blood loss anemia, anticipated blood loss from long bone fracture and surgery: ?Hemoglobin dropped 11-8.4.  No evidence of active blood loss. ?Recheck tomorrow morning. ?Keep on maintenance IV fluids. ? ?COPD with emphysema, chronic hypoxemic respiratory failure: On 1 to 2 L of oxygen.  Stable. ? ?Paroxysmal a flutter, tachycardic today. ?Unable to tolerate rate control medications but asymptomatic.  Will resume Eliquis when possible.  Pain management, fluid management. ? ?History of DVT: Eliquis as above. ? ?AKI on CKD stage IIIa: Maintenance fluid.  Recheck tomorrow. ? ? ? ?DVT prophylaxis: SCDs Start: 01/04/22 0007 ?SCDs Start: 01/02/22 0420 ? ? ?Code Status: Full code ?Family Communication: Wife and daughter at bedside ?Disposition Plan: Status is: Inpatient ?Remains inpatient appropriate because: Immediate postop ?  ? ? ?Consultants:   ?Orthopedics ? ?Procedures:  ?Right femoral intramedullary nail ? ?Antimicrobials:  ?Perioperative antibiotics ? ? ?Subjective: ?Patient seen and examined.  Wife and daughter at the bedside.  Overnight events noted. ?He had low blood pressure and heart rate of 130 but he was asymptomatic.  Given 500 mL of Ringer lactate. ?When patient is staying still, denies any problems.  He is anticipating good pain medication before mobilizing. ? ?Objective: ?Vitals:  ? 01/04/22 3790 01/04/22 0645 01/04/22 0720 01/04/22 0746  ?BP: 111/67 108/75 94/60 110/78  ?Pulse: (!) 131 (!) 131 (!) 131 (!) 129  ?Resp: 18 13 15 15   ?Temp:  98.3 ?F (36.8 ?C)  98.4 ?F (36.9 ?C)  ?TempSrc:  Oral  Oral  ?SpO2: 98% 96% 93% 98%  ?Weight:      ?Height:      ? ? ?Intake/Output Summary (Last 24 hours) at 01/04/2022 1058 ?Last data filed at 01/03/2022 2256 ?Gross per 24 hour  ?Intake 1256.62 ml  ?Output 400 ml  ?Net 856.62 ml  ? ?Filed Weights  ? 01/02/22 1305 01/03/22 1448  ?Weight: 104 kg 104.3 kg  ? ? ?Examination: ? ?General exam: Appears calm and comfortable  ?Chronically sick looking but not in any distress.   ?Respiratory system: Clear to auscultation. Respiratory effort normal.  No added sounds. ?Cardiovascular system: S1 & S2 heard, irregularly irregular.  Tachycardic. ?Gastrointestinal system: Abdomen is nondistended, soft and nontender. No organomegaly or masses felt. Normal bowel sounds heard. ?Central nervous system: Alert and oriented. No focal neurological deficits. ?Extremities: Symmetric 5 x 5 power. ?Skin:  ?Right anterior thigh incision with dressing, minimal surrounding swelling and induration as expected postop. ? ? ? ?Data Reviewed: I have personally reviewed following labs  and imaging studies ? ?CBC: ?Recent Labs  ?Lab 01/02/22 ?0118 01/02/22 ?0973 01/03/22 ?0456 01/04/22 ?0040  ?WBC 15.2* 15.6* 14.1* 16.6*  ?NEUTROABS 13.1*  --   --   --   ?HGB 12.8* 11.7* 10.4* 8.4*  ?HCT 39.2 36.1* 32.5* 25.9*  ?MCV 90.5 90.7 91.5 91.8  ?PLT  223 208 198 157  ? ?Basic Metabolic Panel: ?Recent Labs  ?Lab 01/02/22 ?0118 01/03/22 ?5329  ?NA 140 140  ?K 4.2 4.8  ?CL 109 109  ?CO2 24 25  ?GLUCOSE 160* 127*  ?BUN 13 22  ?CREATININE 1.25* 1.61*  ?CALCIUM 8.8* 8.6*  ? ?GFR: ?Estimated Creatinine Clearance: 44.3 mL/min (A) (by C-G formula based on SCr of 1.61 mg/dL (H)). ?Liver Function Tests: ?Recent Labs  ?Lab 01/02/22 ?0118  ?AST 15  ?ALT 11  ?ALKPHOS 41  ?BILITOT 1.0  ?PROT 6.5  ?ALBUMIN 3.4*  ? ?No results for input(s): LIPASE, AMYLASE in the last 168 hours. ?No results for input(s): AMMONIA in the last 168 hours. ?Coagulation Profile: ?No results for input(s): INR, PROTIME in the last 168 hours. ?Cardiac Enzymes: ?No results for input(s): CKTOTAL, CKMB, CKMBINDEX, TROPONINI in the last 168 hours. ?BNP (last 3 results) ?No results for input(s): PROBNP in the last 8760 hours. ?HbA1C: ?No results for input(s): HGBA1C in the last 72 hours. ?CBG: ?No results for input(s): GLUCAP in the last 168 hours. ?Lipid Profile: ?No results for input(s): CHOL, HDL, LDLCALC, TRIG, CHOLHDL, LDLDIRECT in the last 72 hours. ?Thyroid Function Tests: ?No results for input(s): TSH, T4TOTAL, FREET4, T3FREE, THYROIDAB in the last 72 hours. ?Anemia Panel: ?No results for input(s): VITAMINB12, FOLATE, FERRITIN, TIBC, IRON, RETICCTPCT in the last 72 hours. ?Sepsis Labs: ?No results for input(s): PROCALCITON, LATICACIDVEN in the last 168 hours. ? ?Recent Results (from the past 240 hour(s))  ?SARS CORONAVIRUS 2 (TAT 6-24 HRS) Nasopharyngeal Nasopharyngeal Swab     Status: None  ? Collection Time: 01/02/22  5:54 AM  ? Specimen: Nasopharyngeal Swab  ?Result Value Ref Range Status  ? SARS Coronavirus 2 NEGATIVE NEGATIVE Final  ?  Comment: (NOTE) ?SARS-CoV-2 target nucleic acids are NOT DETECTED. ? ?The SARS-CoV-2 RNA is generally detectable in upper and lower ?respiratory specimens during the acute phase of infection. Negative ?results do not preclude SARS-CoV-2 infection, do not rule  out ?co-infections with other pathogens, and should not be used as the ?sole basis for treatment or other patient management decisions. ?Negative results must be combined with clinical observations, ?patient history, and epidemiological information. The expected ?result is Negative. ? ?Fact Sheet for Patients: ?SugarRoll.be ? ?Fact Sheet for Healthcare Providers: ?https://www.woods-mathews.com/ ? ?This test is not yet approved or cleared by the [place] FDA and  ?has been authorized for detection and/or diagnosis of SARS-CoV-2 by ?FDA under an Emergency Use Authorization (EUA). This EUA will remain  ?in effect (meaning this test can be used) for the duration of the ?COVID-19 declaration under Se ction 564(b)(1) of the Act, 21 U.S.C. ?section 360bbb-3(b)(1), unless the authorization is terminated or ?revoked sooner. ? ?Performed at [facility], [place] [address], [place], [place] ?[address] ?  ?Surgical pcr screen     Status: None  ? Collection Time: 01/03/22  3:33 AM  ? Specimen: Nasal Mucosa; Nasal Swab  ?Result Value Ref Range Status  ? MRSA, PCR NEGATIVE NEGATIVE Final  ? Staphylococcus aureus NEGATIVE NEGATIVE Final  ?  Comment: (NOTE) ?The Xpert SA Assay (FDA approved for NASAL specimens in patients 73 ?years of age and  older), is one component of a comprehensive ?surveillance program. It is not intended to diagnose infection nor to ?guide or monitor treatment. ?Performed at [facility], [place] [address], [place], [place] ?[address] ?  ?  ? ? ? ? ? ?Radiology Studies: ?DG C-Arm 1-60 Min-No Report ? ?Result Date: 01/03/2022 ?Fluoroscopy was utilized by the requesting physician.  No radiographic interpretation.  ? ?DG C-Arm 1-60 Min-No Report ? ?Result Date: 01/03/2022 ?Fluoroscopy was utilized by the requesting physician.  No radiographic interpretation.  ? ?DG C-Arm 1-60 Min-No Report ? ?Result Date: 01/03/2022 ?Fluoroscopy was utilized by the requesting  physician.  No radiographic interpretation.  ? ?DG FEMUR, MIN 2 VIEWS RIGHT ? ?Result Date: 01/03/2022 ?CLINICAL DATA:  post op right femur fracture. EXAM: RIGHT FEMUR 2 VIEWS COMPARISON:  X-ray right hip 01/02/2022 FINDINGS:

## 2022-01-04 NOTE — Progress Notes (Signed)
Pt arrived from PACU, alert and oriented x 4, no chest pain, no SOB, no acute distress noted. ?Pt HR elevated at 130s, BP soft, but pt asymptomatic. ?Dr. Marlowe Sax at the bedside to assess pt. New order for bolus placed. ?Bolus completed, HR remains elevated in 130s. ?[place], MD paged and new bolus for 576ml of Normal Saline ordered. ?Olena Heckle, NP notified that second bolus is completed and HR still in 130s. ?According to Marlowe Sax, MD recommendation, no further intervention needed unless HR iis above 140, or pt is symptomatic. ?Will continue to monitor pt closely. ?

## 2022-01-04 NOTE — Progress Notes (Signed)
Overnight progress note ? ?Patient admitted for right hip intertrochanteric fracture and underwent surgery this evening. Also underwent aspiration of right knee effusion and injection.  Hx of paroxysmal atrial flutter, COPD, chronic hypoxic respiratory failure on home O2.  ? ?Notified by RN that after arriving to PACU he was noted to be hypotensive with blood pressure in the 60s and tachycardic to the 150s.  Required Neo-Synephrine drip for about an hour after which his blood pressures improved and the drip was discontinued.  He was also given a 250 cc bolus and albumin.  Patient was then transported to 5N. ? ?I have seen the patient at bedside. Awake and alert.  He has no complaints.  Denies chest pain, shortness of breath, or pain anywhere else. Noted to be tachycardic with heart rate in the upper 120s/ low 130s.  SPO2 88-92% on room air.  Respiratory rate 17.  Blood pressure 105/61. ? ?Patient reports using 4L home oxygen as needed.  Lungs currently clear on exam.  No peripheral edema appreciated.  SPO2 improved to 96% with 2 L supplemental oxygen, no respiratory distress. No obvious bleeding from surgical site.  ? ?EKG done and reviewed with on-call cardiologist Dr. Alfred Levins, showing atrial flutter with variable AV block. Previous echo showing normal EF. Plan is to continue IV fluid hydration to see if heart rate responds. 1 L fluid bolus ordered. Avoiding rate control agents at this time to avoid hypotension, unless if rate persistently above 140 or symptomatic, then cardiology recommending giving low dose IV metoprolol or starting low dose IV cardizem gtt only if blood pressure is able to tolerate. Stat repeat CBC ordered to check for acute drop in Hgb. Transfer to progressive care unit and monitor very closely.  ?

## 2022-01-04 NOTE — Progress Notes (Signed)
PT Cancellation Note ? ?Patient Details ?Name: Jeffrey Stephens ?MRN: [id number] ?DOB: Sep 29, 1940 ? ? ?Cancelled Treatment:    Reason Eval/Treat Not Completed: Medical issues which prohibited therapy; order for start tomorrow, but RN requesting today.  Checked on pt and resting HR 135, RN reports MD stated to keep for start tomorrow.  ? ? ?Reginia Naas ?01/04/2022, 11:10 AM ?Magda Kiel, PT ?Acute Rehabilitation Services ?XIVHS:[phone] ?Office:[phone] ?01/04/2022 ? ?

## 2022-01-05 ENCOUNTER — Inpatient Hospital Stay (HOSPITAL_COMMUNITY): Payer: Medicare PPO

## 2022-01-05 DIAGNOSIS — I4891 Unspecified atrial fibrillation: Secondary | ICD-10-CM

## 2022-01-05 DIAGNOSIS — J431 Panlobular emphysema: Secondary | ICD-10-CM | POA: Diagnosis not present

## 2022-01-05 DIAGNOSIS — S72001A Fracture of unspecified part of neck of right femur, initial encounter for closed fracture: Secondary | ICD-10-CM | POA: Diagnosis not present

## 2022-01-05 DIAGNOSIS — I4892 Unspecified atrial flutter: Secondary | ICD-10-CM | POA: Diagnosis not present

## 2022-01-05 LAB — BASIC METABOLIC PANEL
Anion gap: 5 (ref 5–15)
BUN: 25 mg/dL — ABNORMAL HIGH (ref 8–23)
CO2: 21 mmol/L — ABNORMAL LOW (ref 22–32)
Calcium: 8.2 mg/dL — ABNORMAL LOW (ref 8.9–10.3)
Chloride: 112 mmol/L — ABNORMAL HIGH (ref 98–111)
Creatinine, Ser: 1.64 mg/dL — ABNORMAL HIGH (ref 0.61–1.24)
GFR, Estimated: 42 mL/min — ABNORMAL LOW (ref >60)
Glucose, Bld: 123 mg/dL — ABNORMAL HIGH (ref 70–99)
Potassium: 4.7 mmol/L (ref 3.5–5.1)
Sodium: 138 mmol/L (ref 135–145)

## 2022-01-05 LAB — ECHOCARDIOGRAM COMPLETE
AR max vel: 3.46 cm2
AV Area VTI: 3.28 cm2
AV Area mean vel: 3.27 cm2
AV Mean grad: 6 mmHg
AV Peak grad: 11.6 mmHg
Ao pk vel: 1.7 m/s
Area-P 1/2: 5.02 cm2
Height: 69 in
S' Lateral: 2.5 cm
Weight: 3680 oz

## 2022-01-05 LAB — CBC
HCT: 21.8 % — ABNORMAL LOW (ref 39.0–52.0)
Hemoglobin: 7.4 g/dL — ABNORMAL LOW (ref 13.0–17.0)
MCH: 30.7 pg (ref 26.0–34.0)
MCHC: 33.9 g/dL (ref 30.0–36.0)
MCV: 90.5 fL (ref 80.0–100.0)
Platelets: 152 10*3/uL (ref 150–400)
RBC: 2.41 MIL/uL — ABNORMAL LOW (ref 4.22–5.81)
RDW: 16.6 % — ABNORMAL HIGH (ref 11.5–15.5)
WBC: 11 10*3/uL — ABNORMAL HIGH (ref 4.0–10.5)
nRBC: 0 % (ref 0.0–0.2)

## 2022-01-05 MED ORDER — SENNOSIDES-DOCUSATE SODIUM 8.6-50 MG PO TABS
1.0000 | ORAL_TABLET | Freq: Two times a day (BID) | ORAL | Status: DC
  Administered 2022-01-05 – 2022-01-08 (×6): 1 via ORAL
  Filled 2022-01-05 (×7): qty 1

## 2022-01-05 MED ORDER — POLYETHYLENE GLYCOL 3350 17 G PO PACK: 17.0000 g | PACK | Freq: Every day | ORAL | Status: DC | PRN

## 2022-01-05 MED ORDER — LACTULOSE 10 GM/15ML PO SOLN
20.0000 g | Freq: Two times a day (BID) | ORAL | Status: DC | PRN
  Administered 2022-01-06 (×2): 20 g via ORAL
  Filled 2022-01-05 (×2): qty 30

## 2022-01-05 NOTE — Progress Notes (Signed)
?  Subjective: ?Jeffrey Stephens is a 81 y.o. male s/p right hip IM nail.  They are POD 1.  Pain is controlled and improving.  Denies any chest pain or shortness of breath.  No abdominal pain or calf pain.  Feels somewhat fatigued.  Was able to sit on edge of bed with PT; no dizziness/lightheadedness while seated. ? ?Objective: ?Vital signs in last 24 hours: ?Temp:  [97.7 ?F (36.5 ?C)-98 ?F (36.7 ?C)] 98 ?F (36.7 ?C) (04/13 0750) ?Pulse Rate:  [66-141] 70 (04/13 0251) ?Resp:  [15-19] 16 (04/13 0251) ?BP: (95-110)/(52-72) 95/52 (04/13 0750) ?SpO2:  [93 %-100 %] 94 % (04/13 0251) ? ?Intake/Output from previous day: ?04/12 0701 - 04/13 0700 ?In: 1402.3 [P.O.:480; I.V.:922.3] ?Out: 1225 [Urine:1225] ?Intake/Output this shift: ?Total I/O ?In: 240 [P.O.:240] ?Out: -  ? ?Exam: ? ?No gross blood or drainage overlying the dressings ?2+ DP pulse ?Sensation intact distally in the right foot ?Able to dorsiflex and plantarflex the right ankle ? ? ?Labs: ?Recent Labs  ?  01/03/22 ?0456 01/04/22 ?0040 01/05/22 ?0218  ?HGB 10.4* 8.4* 7.4*  ? ?Recent Labs  ?  01/04/22 ?0040 01/05/22 ?0218  ?WBC 16.6* 11.0*  ?RBC 2.82* 2.41*  ?HCT 25.9* 21.8*  ?PLT 157 152  ? ?Recent Labs  ?  01/03/22 ?0456 01/05/22 ?0218  ?NA 140 138  ?K 4.8 4.7  ?CL 109 112*  ?CO2 25 21*  ?BUN 22 25*  ?CREATININE 1.61* 1.64*  ?GLUCOSE 127* 123*  ?CALCIUM 8.6* 8.2*  ? ?No results for input(s): LABPT, INR in the last 72 hours. ? ?Assessment/Plan: ?Pt is POD 1 s/p right hip IM nail.   ? -Disposition pending medical team clearance and decision ? -Nonweightbearing to right lower extremity, okay for touchdown weightbearing for transfers only ? -DVT Prophylaxis: SCDs and Eliquis. Hgb has dropped again as expected but patient is not symptomatic and Hgb remains above 7 so no transfusion at this time. Environmental manager.   ? -Patient and family prefer discharge home and are purchasing equipment. They will have 24/7 care available to patient with multiple family  members pitching in. One family member is a retired Therapist, sports ?  ? ? ?Gerrianne Scale Rubbie Goostree ?01/05/2022, 12:36 PM  ? ? ?   ?

## 2022-01-05 NOTE — Care Management Important Message (Signed)
Important Message ? ?Patient Details  ?Name: Jeffrey Stephens ?MRN: [id number] ?Date of Birth: [birth year] ? ? ?Medicare Important Message Given:  Yes ? ? ? ? ?Hamdan Toscano ?01/05/2022, 3:11 PM ?

## 2022-01-05 NOTE — TOC Progression Note (Signed)
Transition of Care (TOC) - Progression Note  ? ? ?Patient Details  ?Name: LEOBARDO GRANLUND ?MRN: [id number] ?Date of Birth: 19-Oct-1940 ? ?Transition of Care (TOC) CM/SW Contact  ?Sharin Mons, RN ?Phone Number: ?01/05/2022, 5:09 PM ? ?Clinical Narrative:    ?Pt gives the ok to spoke with pt's wife  and daughter  regarding d/c planning. Family/pt declined SNF placement. Agreeable to home health services. Daughter selected [facility] for [facility] services. Referral made with [facility] and accepted. DME, RW, BSC and W/C, referral made with [facility]. Equipment will be delivered to room prior to d/c. ? ?TOC team following and will continue to assist with needs. ? ? ?Expected Discharge Plan: Home/Self Care ?Barriers to Discharge: Continued Medical Work up ? ?Expected Discharge Plan and Services ?Expected Discharge Plan: Home/Self Care ?  ?Discharge Planning Services: CM Consult ?  ?Living arrangements for the past 2 months: [place] ?                ?DME Arranged: 3-N-1, Walker rolling, Wheelchair manual ?DME Agency: [facility] ?Date DME Agency Contacted: 01/05/22 ?Time DME Agency Contacted: 4431 ?Representative spoke with at DME Agency: Maudie Mercury ?HH Arranged: OT, PT ?[facility] Agency: [place] ?Date HH Agency Contacted: 01/05/22 ?Time [place]: 5400 ?Representative spoke with at [place]: Tommi Rumps ? ? ?Social Determinants of Health (SDOH) Interventions ?  ? ?Readmission Risk Interventions ?   ? View : No data to display.  ?  ?  ?  ? ? ?

## 2022-01-05 NOTE — Progress Notes (Signed)
Echocardiogram ?2D Echocardiogram has been performed. ? ?Arlyss Gandy ?01/05/2022, 9:37 AM ?

## 2022-01-05 NOTE — Progress Notes (Signed)
Physical Therapy Evaluation ?Patient Details ?Name: Jeffrey Stephens ?MRN: [id number] ?DOB: [birth year] ?Today's Date: 01/05/2022 ? ?History of Present Illness ? 81 yo male admitted 4/9 with onset of fall at home after history of limited mobility was seen for Right hip intertrochanteric/subtrochanteric fracture fixation with reduction and intramedullary nailing , 4/11.  PMHx:  AAA, basal cell CA, urinary frequency, GERD, DVT LLE, cerebral aneurysm, kidney stones, lung CA, PAF, RA, stroke, TIA, depression,  ?Clinical Impression ? Pt and family were participating in therapy session.  Pt is expecting to go home, family expecting to take him but likely due to his cognition and struggle to keep him comfortable.  Has both daughter and wife in attendance, and daughter is retired Therapist, sports who is expecting him to have home care.  Have talked about training family to assist him if they are going that route, but did let family know PT is recommending rehab and will see about that path being a possibility.  ?Follow along with pt for goals of acute PT and will focus on monitoring sats and HR, but also will require O2 for now and requires reminders not to hold his breath.  Lowest sat with movement is 71% again with some breath holding, and on room air.  At rest lowest number is 93% on 2L O2.   ?   ? ?Recommendations for follow up therapy are one component of a multi-disciplinary discharge planning process, led by the attending physician.  Recommendations may be updated based on patient status, additional functional criteria and insurance authorization. ? ?Follow Up Recommendations Skilled nursing-short term rehab (<3 hours/day) ? ?  ?Assistance Recommended at Discharge Frequent or constant Supervision/Assistance  ?Patient can return home with the following ? Two people to help with walking and/or transfers;Two people to help with bathing/dressing/bathroom;Assistance with cooking/housework;Direct supervision/assist for medications  management;Direct supervision/assist for financial management;Assist for transportation;Help with stairs or ramp for entrance ? ?  ?Equipment Recommendations Rolling walker (2 wheels);BSC/3in1;Wheelchair (measurements PT);Wheelchair cushion (measurements PT);Other (comment) (shower chair per family)  ?Recommendations for Other Services ?    ?  ?Functional Status Assessment Patient has had a recent decline in their functional status and demonstrates the ability to make significant improvements in function in a reasonable and predictable amount of time.  ? ?  ?Precautions / Restrictions Precautions ?Precautions: Fall ?Precaution Comments: watch O2 sats ?Restrictions ?Weight Bearing Restrictions: Yes ?RLE Weight Bearing: Non weight bearing ?Other Position/Activity Restrictions: TDWB to transfer which pt may not be able to control  ? ?  ? ?Mobility ? Bed Mobility ?Overal bed mobility: Needs Assistance ?Bed Mobility: Sit to Supine, Rolling, Sidelying to Sit, Sit to Sidelying ?Rolling: Mod assist ?Sidelying to sit: Max assist ?  ?Sit to supine: Total assist, +2 for physical assistance, +2 for safety/equipment ?Sit to sidelying: Total assist, +2 for physical assistance, +2 for safety/equipment ?General bed mobility comments: two person help for return to bed as pt desaturated and was asking to get back, feeling light headed ?  ? ?Transfers ?  ?  ?  ?  ?  ?  ?  ?  ?  ?General transfer comment: declined to attempt ?  ? ?Ambulation/Gait ?  ?  ?  ?  ?  ?  ?  ?General Gait Details: unable to attempt ? ?Stairs ?  ?  ?  ?  ?  ? ?Wheelchair Mobility ?  ? ?Modified Rankin (Stroke Patients Only) ?  ? ?  ? ?Balance Overall balance assessment: Needs assistance, History of  Falls ?Sitting-balance support: Feet supported, Bilateral upper extremity supported ?Sitting balance-Leahy Scale: Poor ?  ?  ?  ?  ?Standing balance comment: intolerant of attempting ?  ?  ?  ?  ?  ?  ?  ?  ?  ?  ?  ?   ? ? ? ?Pertinent Vitals/Pain Pain  Assessment ?Pain Assessment: Faces ?Faces Pain Scale: Hurts whole lot ?Pain Location: R hip to move on bed ?Pain Descriptors / Indicators: Guarding, Grimacing, Operative site guarding, Tender ?Pain Intervention(s): Limited activity within patient's tolerance, Monitored during session, Premedicated before session, Repositioned, Patient requesting pain meds-RN notified  ? ? ?Home Living Family/patient expects to be discharged to:: Private residence ?Living Arrangements: Spouse/significant other ?Available Help at Discharge: Family;Available 24 hours/day ?Type of Home: House ?Home Access: Stairs to enter ?Entrance Stairs-Rails: Right;Left ?Entrance Stairs-Number of Steps: 4 ?  ?Home Layout: One level ?Home Equipment: Conservation officer, nature (2 wheels);Crutches;BSC/3in1;Rollator (4 wheels) (PER FAMILY ALL EQUIPMENT IS BAD) ?   ?  ?Prior Function Prior Level of Function : Needs assist ?  ?  ?  ?Physical Assist : Mobility (physical) ?Mobility (physical): Gait ?  ?Mobility Comments: gait on rollator at home, falls ?  ?  ? ? ?Hand Dominance  ? Dominant Hand: Right ? ?  ?Extremity/Trunk Assessment  ? Upper Extremity Assessment ?Upper Extremity Assessment: Generalized weakness ?  ? ?Lower Extremity Assessment ?Lower Extremity Assessment: Generalized weakness;RLE deficits/detail ?RLE Deficits / Details: weak and painful with no active R hip movement to get to side of bed or back to bed ?RLE: Unable to fully assess due to pain ?RLE Coordination: decreased gross motor ?  ? ?Cervical / Trunk Assessment ?Cervical / Trunk Assessment: Kyphotic;Other exceptions (tends to list backward on bed, posture is altered by ROM in hips as well)  ?Communication  ? Communication: HOH  ?Cognition Arousal/Alertness: Awake/alert ?Behavior During Therapy: Anxious ?Overall Cognitive Status: History of cognitive impairments - at baseline ?  ?  ?  ?  ?  ?  ?  ?  ?  ?  ?  ?  ?  ?  ?  ?  ?General Comments: has had a stroke and aneurysm, mult cognitive insults ?   ?  ? ?  ?General Comments General comments (skin integrity, edema, etc.): pt is up to side of bed with help, tends to hold his breath and drop O2 sats ? ?  ?Exercises    ? ?Assessment/Plan  ?  ?PT Assessment Patient needs continued PT services  ?PT Problem List Decreased strength;Decreased range of motion;Decreased activity tolerance;Decreased balance;Decreased mobility;Decreased coordination;Decreased cognition;Decreased safety awareness;Decreased knowledge of precautions;Cardiopulmonary status limiting activity;Decreased skin integrity;Pain;Obesity ? ?   ?  ?PT Treatment Interventions     ? ?PT Goals (Current goals can be found in the Care Plan section)  ?Acute Rehab PT Goals ?Patient Stated Goal: to go home with wife ?PT Goal Formulation: With patient/family ?Time For Goal Achievement: 01/19/22 ?Potential to Achieve Goals: Fair ? ?  ?Frequency Min 5X/week ?  ? ? ?Co-evaluation   ?  ?  ?  ?  ? ? ?  ?AM-PAC PT "6 Clicks" Mobility  ?Outcome Measure   ?  ?  ?  ?  ?  ?  ? ?  ?End of Session Equipment Utilized During Treatment: Gait belt;Oxygen ?Activity Tolerance: Patient limited by fatigue;Treatment limited secondary to medical complications (Comment);Patient limited by pain ?Patient left: in bed;with call bell/phone within reach;with bed alarm set;with family/visitor present ?Nurse Communication: Mobility status;Other (comment);Patient  requests pain meds (gave pt and family information about follow up for both SNF and to go home, would have to instruct family to take him) ?PT Visit Diagnosis: Muscle weakness (generalized) (M62.81);Other abnormalities of gait and mobility (R26.89);Pain;Dizziness and giddiness (R42);History of falling (Z91.81) ?Pain - Right/Left: Right ?Pain - part of body: Hip;Knee ?  ? ?Time: 3612-2449 ?PT Time Calculation (min) (ACUTE ONLY): 38 min ? ? ?Charges:   PT Evaluation ?$PT Eval Moderate Complexity: 1 Mod ?PT Treatments ?$Therapeutic Exercise: 8-22 mins ?$Therapeutic Activity: 8-22 mins ?   ?   ? ?Ramond Dial ?01/05/2022, 1:50 PM ? ?Mee Hives, PT PhD ?Acute Rehab Dept. Number: [facility] [phone] and [facility] [phone] ? ?

## 2022-01-05 NOTE — Progress Notes (Addendum)
?  ?  Durable Medical Equipment  ?(From admission, onward)  ?  ? ? ?  ? ?  Start     Ordered  ? 01/05/22 1647  For home use only DME 3 n 1  Once       ? 01/05/22 1647  ? 01/05/22 1646  For home use only DME high strength lightweight manual wheelchair with seat cushion  Once       ?Comments: Patient suffers from Right hip intertrochanteric/subtrochanteric fracture fixation with reduction and intramedullary nailing which impairs their ability to perform daily activities like bathing in the home.  A walker will not resolve  ?issue with performing activities of daily living. A wheelchair will allow patient to safely perform daily activities.Length of need 6 months . (THEN ONE OF THESE TWO:) Patient self-propels the wheelchair while engaging in frequent activities such as laundry which cannot be performed in a standard or lightweight wheelchair due to the weight of the chair. Accessories: elevating leg rests (ELRs), wheel locks, extensions and anti-tippers.  ? 01/05/22 1647  ? 01/05/22 1645  For home use only DME Walker rolling  Once       ?Question Answer Comment  ?Walker: With 5 Inch Wheels   ?Patient needs a walker to treat with the following condition Fx   ?  ? 01/05/22 1647  ? ?  ?  ? ?  ?  ?

## 2022-01-05 NOTE — Progress Notes (Signed)
?PROGRESS NOTE ? ? ? ?Jeffrey Stephens  LTJ:[id number] DOB: 30-May-1941 DOA: 01/01/2022 ?PCP: Lavone Orn, MD  ? ? ?Brief Narrative:  ?81 year old with paroxysmal a flutter on Eliquis, history of DVT, AAA, history of lung cancer status post lobectomy in 2017, orthostatic hypotension, COPD with emphysema, chronic hypoxic respiratory failure on intermittent home oxygen, depression, GERD presented to the ER with right hip pain after mechanical fall.  He was found to have right intertrochanteric proximal right femur fracture. ?4/12, overnight tachycardia with some drop in blood pressure, 500 mL fluid given.  He does have a history of chronic A-flutter , sotalol caused bradycardia so not on any rate control medicine.  ? ? ?Assessment & Plan: ?  ?Close traumatic right intertrochanteric femoral fracture: ?ORIF with IM nailing 4/11, Dr. Marlou Sa ?DVT prophylaxis, resumed Eliquis ?Pain management with IV and oral opiates, dilaudid , oxycodone and muscle relaxant along with laxatives.  ?Nonweightbearing right lower extremity. ?Start working with therapist today. ? ?Acute blood loss anemia, anticipated blood loss from long bone fracture and surgery: ?Hemoglobin dropped 11-8.4-7.4  No evidence of active blood loss. ?Recheck tomorrow morning.  Transfuse if less than 7.  Continue Eliquis as this is anticipated blood loss. ? ?COPD with emphysema, chronic hypoxemic respiratory failure: On 1 to 2 L of oxygen.  Stable. ? ?Paroxysmal a flutter with RVR: ?Started on Cardizem 60 mg every 6 hours, heart rate is controlled and remains in a flutter.  Asymptomatic.  Eliquis resumed.  Continue metoprolol as needed to control the heart rate.  We will avoid high doses of rate control medications with history of hypotension and bradycardia.  If fairly controlled, will change to long-acting Cardizem tomorrow. ?Echocardiogram with normal ejection fraction. ? ?History of DVT: Eliquis as above. ? ?AKI on CKD stage IIIa: Stabilizing.  Recheck  tomorrow. ? ? ? ?DVT prophylaxis: SCDs Start: 01/04/22 0007 ?SCDs Start: 01/02/22 0420 ?apixaban (ELIQUIS) tablet 5 mg  ? ?Code Status: Full code ?Family Communication: None today. ?Disposition Plan: Status is: Inpatient ?Remains inpatient appropriate because: Immediate postop, yet to work with therapies. ?  ? ? ?Consultants:  ?Orthopedics ? ?Procedures:  ?Right femoral intramedullary nail ? ?Antimicrobials:  ?Perioperative antibiotics ? ? ?Subjective: ? ?Patient seen and examined.  Pain controlled today.  Complains of constipation.  Denies any chest pain or palpitations. ?Not mobilized yet. ?Telemetry shows a flutter, rate controlled. ? ?Objective: ?Vitals:  ? 01/04/22 2045 01/05/22 0250 01/05/22 0251 01/05/22 0750  ?BP:  103/66  (!) 95/52  ?Pulse: 66 84 70   ?Resp: 19 18 16    ?Temp:    98 ?F (36.7 ?C)  ?TempSrc:    Oral  ?SpO2: 99% 93% 94%   ?Weight:      ?Height:      ? ? ?Intake/Output Summary (Last 24 hours) at 01/05/2022 1126 ?Last data filed at 01/05/2022 0900 ?Gross per 24 hour  ?Intake 1642.26 ml  ?Output 1225 ml  ?Net 417.26 ml  ? ?Filed Weights  ? 01/02/22 1305 01/03/22 1448  ?Weight: 104 kg 104.3 kg  ? ? ?Examination: ? ?General exam: Appears calm and comfortable .  Not in any distress.  On minimal oxygen. ?Respiratory system: Clear to auscultation. Respiratory effort normal.  No added sounds. ?Cardiovascular system: S1 & S2 heard, irregularly irregular.  ?Gastrointestinal system: Abdomen is nondistended, soft and nontender. No organomegaly or masses felt. Normal bowel sounds heard. ?Central nervous system: Alert and oriented. No focal neurological deficits. ?Extremities: Symmetric 5 x 5 power. ?Skin:  ?Right lateral  thigh incision with dressing, minimal surrounding swelling and induration as expected postop. ? ? ? ?Data Reviewed: I have personally reviewed following labs and imaging studies ? ?CBC: ?Recent Labs  ?Lab 01/02/22 ?0118 01/02/22 ?1610 01/03/22 ?0456 01/04/22 ?0040 01/05/22 ?0218  ?WBC 15.2*  15.6* 14.1* 16.6* 11.0*  ?NEUTROABS 13.1*  --   --   --   --   ?HGB 12.8* 11.7* 10.4* 8.4* 7.4*  ?HCT 39.2 36.1* 32.5* 25.9* 21.8*  ?MCV 90.5 90.7 91.5 91.8 90.5  ?PLT 223 208 198 157 152  ? ?Basic Metabolic Panel: ?Recent Labs  ?Lab 01/02/22 ?0118 01/03/22 ?0456 01/05/22 ?9604  ?NA 140 140 138  ?K 4.2 4.8 4.7  ?CL 109 109 112*  ?CO2 24 25 21*  ?GLUCOSE 160* 127* 123*  ?BUN 13 22 25*  ?CREATININE 1.25* 1.61* 1.64*  ?CALCIUM 8.8* 8.6* 8.2*  ? ?GFR: ?Estimated Creatinine Clearance: 43.4 mL/min (A) (by C-G formula based on SCr of 1.64 mg/dL (H)). ?Liver Function Tests: ?Recent Labs  ?Lab 01/02/22 ?0118  ?AST 15  ?ALT 11  ?ALKPHOS 41  ?BILITOT 1.0  ?PROT 6.5  ?ALBUMIN 3.4*  ? ?No results for input(s): LIPASE, AMYLASE in the last 168 hours. ?No results for input(s): AMMONIA in the last 168 hours. ?Coagulation Profile: ?No results for input(s): INR, PROTIME in the last 168 hours. ?Cardiac Enzymes: ?No results for input(s): CKTOTAL, CKMB, CKMBINDEX, TROPONINI in the last 168 hours. ?BNP (last 3 results) ?No results for input(s): PROBNP in the last 8760 hours. ?HbA1C: ?No results for input(s): HGBA1C in the last 72 hours. ?CBG: ?No results for input(s): GLUCAP in the last 168 hours. ?Lipid Profile: ?No results for input(s): CHOL, HDL, LDLCALC, TRIG, CHOLHDL, LDLDIRECT in the last 72 hours. ?Thyroid Function Tests: ?No results for input(s): TSH, T4TOTAL, FREET4, T3FREE, THYROIDAB in the last 72 hours. ?Anemia Panel: ?No results for input(s): VITAMINB12, FOLATE, FERRITIN, TIBC, IRON, RETICCTPCT in the last 72 hours. ?Sepsis Labs: ?No results for input(s): PROCALCITON, LATICACIDVEN in the last 168 hours. ? ?Recent Results (from the past 240 hour(s))  ?SARS CORONAVIRUS 2 (TAT 6-24 HRS) Nasopharyngeal Nasopharyngeal Swab     Status: None  ? Collection Time: 01/02/22  5:54 AM  ? Specimen: Nasopharyngeal Swab  ?Result Value Ref Range Status  ? SARS Coronavirus 2 NEGATIVE NEGATIVE Final  ?  Comment: (NOTE) ?SARS-CoV-2 target  nucleic acids are NOT DETECTED. ? ?The SARS-CoV-2 RNA is generally detectable in upper and lower ?respiratory specimens during the acute phase of infection. Negative ?results do not preclude SARS-CoV-2 infection, do not rule out ?co-infections with other pathogens, and should not be used as the ?sole basis for treatment or other patient management decisions. ?Negative results must be combined with clinical observations, ?patient history, and epidemiological information. The expected ?result is Negative. ? ?Fact Sheet for Patients: ?SugarRoll.be ? ?Fact Sheet for Healthcare Providers: ?https://www.woods-mathews.com/ ? ?This test is not yet approved or cleared by the [place] FDA and  ?has been authorized for detection and/or diagnosis of SARS-CoV-2 by ?FDA under an Emergency Use Authorization (EUA). This EUA will remain  ?in effect (meaning this test can be used) for the duration of the ?COVID-19 declaration under Se ction 564(b)(1) of the Act, 21 U.S.C. ?section 360bbb-3(b)(1), unless the authorization is terminated or ?revoked sooner. ? ?Performed at [facility], [place] [address] [place], [place] ?[address] ?  ?Surgical pcr screen     Status: None  ? Collection Time: 01/03/22  3:33 AM  ? Specimen: Nasal Mucosa;  Nasal Swab  ?Result Value Ref Range Status  ? MRSA, PCR NEGATIVE NEGATIVE Final  ? Staphylococcus aureus NEGATIVE NEGATIVE Final  ?  Comment: (NOTE) ?The Xpert SA Assay (FDA approved for NASAL specimens in patients 46 ?years of age and older), is one component of a comprehensive ?surveillance program. It is not intended to diagnose infection nor to ?guide or monitor treatment. ?Performed at [facility], [place] [address], [place], [place] ?[address] ?  ?  ? ? ? ? ? ?Radiology Studies: ?DG C-Arm 1-60 Min-No Report ? ?Result Date: 01/03/2022 ?Fluoroscopy was utilized by the requesting physician.  No radiographic interpretation.  ? ?DG C-Arm 1-60 Min-No  Report ? ?Result Date: 01/03/2022 ?Fluoroscopy was utilized by the requesting physician.  No radiographic interpretation.  ? ?DG C-Arm 1-60 Min-No Report ? ?Result Date: 01/03/2022 ?Fluoroscopy was utilized by the requesting p

## 2022-01-06 ENCOUNTER — Inpatient Hospital Stay (HOSPITAL_COMMUNITY): Payer: Medicare PPO

## 2022-01-06 DIAGNOSIS — I4892 Unspecified atrial flutter: Secondary | ICD-10-CM | POA: Diagnosis not present

## 2022-01-06 DIAGNOSIS — J431 Panlobular emphysema: Secondary | ICD-10-CM | POA: Diagnosis not present

## 2022-01-06 DIAGNOSIS — S72001A Fracture of unspecified part of neck of right femur, initial encounter for closed fracture: Secondary | ICD-10-CM | POA: Diagnosis not present

## 2022-01-06 LAB — CBC
HCT: 22.7 % — ABNORMAL LOW (ref 39.0–52.0)
Hemoglobin: 7.2 g/dL — ABNORMAL LOW (ref 13.0–17.0)
MCH: 29.3 pg (ref 26.0–34.0)
MCHC: 31.7 g/dL (ref 30.0–36.0)
MCV: 92.3 fL (ref 80.0–100.0)
Platelets: 190 10*3/uL (ref 150–400)
RBC: 2.46 MIL/uL — ABNORMAL LOW (ref 4.22–5.81)
RDW: 17 % — ABNORMAL HIGH (ref 11.5–15.5)
WBC: 11 10*3/uL — ABNORMAL HIGH (ref 4.0–10.5)
nRBC: 0 % (ref 0.0–0.2)

## 2022-01-06 LAB — BASIC METABOLIC PANEL
Anion gap: 4 — ABNORMAL LOW (ref 5–15)
BUN: 25 mg/dL — ABNORMAL HIGH (ref 8–23)
CO2: 23 mmol/L (ref 22–32)
Calcium: 8.3 mg/dL — ABNORMAL LOW (ref 8.9–10.3)
Chloride: 109 mmol/L (ref 98–111)
Creatinine, Ser: 1.27 mg/dL — ABNORMAL HIGH (ref 0.61–1.24)
GFR, Estimated: 57 mL/min — ABNORMAL LOW (ref >60)
Glucose, Bld: 120 mg/dL — ABNORMAL HIGH (ref 70–99)
Potassium: 4.4 mmol/L (ref 3.5–5.1)
Sodium: 136 mmol/L (ref 135–145)

## 2022-01-06 MED ORDER — LACTULOSE 10 GM/15ML PO SOLN
20.0000 g | Freq: Two times a day (BID) | ORAL | Status: DC
  Administered 2022-01-06 – 2022-01-08 (×4): 20 g via ORAL
  Filled 2022-01-06 (×4): qty 30

## 2022-01-06 MED ORDER — METHOCARBAMOL 500 MG PO TABS: 500.0000 mg | ORAL_TABLET | Freq: Four times a day (QID) | ORAL | 0 refills | Status: AC | PRN

## 2022-01-06 MED ORDER — FLEET ENEMA 7-19 GM/118ML RE ENEM
1.0000 | ENEMA | Freq: Once | RECTAL | Status: AC
  Administered 2022-01-06: 1 via RECTAL
  Filled 2022-01-06: qty 1

## 2022-01-06 MED ORDER — BISACODYL 10 MG RE SUPP
10.0000 mg | Freq: Once | RECTAL | Status: AC
  Administered 2022-01-06: 10 mg via RECTAL
  Filled 2022-01-06: qty 1

## 2022-01-06 MED ORDER — SODIUM CHLORIDE 0.9 % IV SOLN
250.0000 mg | Freq: Once | INTRAVENOUS | Status: AC
  Administered 2022-01-06: 250 mg via INTRAVENOUS
  Filled 2022-01-06: qty 20

## 2022-01-06 MED ORDER — OXYCODONE HCL 5 MG PO TABS: 5.0000 mg | ORAL_TABLET | ORAL | 0 refills | Status: DC | PRN

## 2022-01-06 MED ORDER — DILTIAZEM HCL ER COATED BEADS 180 MG PO CP24
180.0000 mg | ORAL_CAPSULE | Freq: Every day | ORAL | Status: DC
  Administered 2022-01-06 – 2022-01-08 (×3): 180 mg via ORAL
  Filled 2022-01-06 (×3): qty 1

## 2022-01-06 NOTE — Plan of Care (Signed)
?  Problem: Clinical Measurements: ?Goal: Ability to maintain clinical measurements within normal limits will improve ?Outcome: Progressing ?Goal: Will remain free from infection ?Outcome: Progressing ?Goal: Diagnostic test results will improve ?Outcome: Progressing ?Goal: Respiratory complications will improve ?Outcome: Progressing ?Goal: Cardiovascular complication will be avoided ?Outcome: Progressing ?  ?Problem: Nutrition: ?Goal: Adequate nutrition will be maintained ?Outcome: Progressing ?  ?Problem: Activity: ?Goal: Risk for activity intolerance will decrease ?Outcome: Progressing ?  ?Problem: Elimination: ?Goal: Will not experience complications related to bowel motility ?Outcome: Progressing ?Goal: Will not experience complications related to urinary retention ?Outcome: Progressing ?  ?Problem: Pain Managment: ?Goal: General experience of comfort will improve ?Outcome: Progressing ?  ?Problem: Safety: ?Goal: Ability to remain free from injury will improve ?Outcome: Progressing ?  ?Problem: Skin Integrity: ?Goal: Risk for impaired skin integrity will decrease ?Outcome: Progressing ?  ?

## 2022-01-06 NOTE — Progress Notes (Signed)
Physical Therapy Treatment ?Patient Details ?Name: Jeffrey Stephens ?MRN: [id number] ?DOB: [birth year] ?Today's Date: 01/06/2022 ? ? ?History of Present Illness 81 yo male admitted 4/9 with onset of fall at home after history of limited mobility was seen for Right hip intertrochanteric/subtrochanteric fracture fixation with reduction and intramedullary nailing , 4/11.  PMHx:  AAA, basal cell CA, urinary frequency, GERD, DVT LLE, cerebral aneurysm, kidney stones, lung CA, PAF, RA, stroke, TIA, depression, ? ?  ?PT Comments  ? ? Pt admitted with above diagnosis. Pt was able to transfer to drop arm recliner with max assist of 2 via sliding board. Daughter did assist with getting pt to eOB and PT assisted daughter with transfer to chair.  Daughter states that they can provide the asssist pt needs with equipment as below. Did suggest she get a hoyer lift and she declines. Will continue to provide education for family.   Pt currently with functional limitations due to balance and endurance deficits. Pt will benefit from skilled PT to increase their independence and safety with mobility to allow discharge to the venue listed below.      ?Recommendations for follow up therapy are one component of a multi-disciplinary discharge planning process, led by the attending physician.  Recommendations may be updated based on patient status, additional functional criteria and insurance authorization. ? ?Follow Up Recommendations ? Skilled nursing-short term rehab (<3 hours/day)  HHPT and HHOT if family decides to take pt home.  ?  ?  ?Assistance Recommended at Discharge Frequent or constant Supervision/Assistance  ?Patient can return home with the following Two people to help with walking and/or transfers;Two people to help with bathing/dressing/bathroom;Assistance with cooking/housework;Direct supervision/assist for medications management;Direct supervision/assist for financial management;Assist for transportation;Help with stairs or  ramp for entrance ?  ?Equipment Recommendations ? Rolling walker (2 wheels);BSC/3in1;Wheelchair (18x16 lightweight wheelchair with desk armrests, anti- tippers and elevating legrests);Wheelchair cushion (pressure relieving 18x16 cushion);Other (comment) (shower chair per family, drop arm 3N1, sliding board)  ?  ?Recommendations for Other Services   ? ? ?  ?Precautions / Restrictions Precautions ?Precautions: Fall ?Precaution Comments: watch O2 sats ?Restrictions ?Weight Bearing Restrictions: Yes ?RLE Weight Bearing: Non weight bearing ?Other Position/Activity Restrictions: TDWB to transfer which pt may not be able to control  ?  ? ?Mobility ? Bed Mobility ?Overal bed mobility: Needs Assistance ?Bed Mobility: Rolling, Sidelying to Sit, Supine to Sit ?Rolling: Mod assist ?Sidelying to sit: Max assist, +2 for physical assistance ?  ?  ?  ?General bed mobility comments: Had daughter initiate assist for pt to EOB. Pt able to assist with movement of left LE with daughters cues and needed assist to move right LE.  once pt had legs close to EOB, daughter cued pt to use rail to sit up and pt did assist but did need max assist of 2 with pt pulling up on daughters hands and second person  at trunk to come to full sitting.  then used pad to scoot pt to EOB. ?  ? ?Transfers ?Overall transfer level: Needs assistance ?Equipment used: Sliding board ?Transfers: Bed to chair/wheelchair/BSC ?  ?  ?  ?  ?  ? Lateral/Scoot Transfers: Max assist, +2 physical assistance, With slide board, From elevated surface ?General transfer comment: Pt needed max assist of 2 to slide to drop arm recliner via sliding board. Daughter and PT assisted pt with cues and assist. Pt was able to weight bear on left LE and keep weight off of right LE for transfer. Took  incr time to get him settled in the chair. ?  ? ?Ambulation/Gait ?  ?  ?  ?  ?  ?  ?  ?General Gait Details: unable to attempt ? ? ?Stairs ?  ?  ?  ?  ?  ? ? ?Wheelchair Mobility ?  ? ?Modified  Rankin (Stroke Patients Only) ?  ? ? ?  ?Balance Overall balance assessment: Needs assistance, History of Falls ?Sitting-balance support: Feet supported, Bilateral upper extremity supported ?Sitting balance-Leahy Scale: Poor ?Sitting balance - Comments: Needed min assist to mod assist to sit EOB 10 min as pt loses balance posteriorly at times ?Postural control: Posterior lean ?  ?  ?Standing balance comment: intolerant of attempting ?  ?  ?  ?  ?  ?  ?  ?  ?  ?  ?  ?  ? ?  ?Cognition Arousal/Alertness: Awake/alert ?Behavior During Therapy: Anxious ?Overall Cognitive Status: History of cognitive impairments - at baseline ?  ?  ?  ?  ?  ?  ?  ?  ?  ?  ?  ?  ?  ?  ?  ?  ?General Comments: has had a stroke and aneurysm, mult cognitive insults ?  ?  ? ?  ?Exercises General Exercises - Lower Extremity ?Ankle Circles/Pumps: AROM, Both, 10 reps, Supine ?Quad Sets: AROM, Both, 5 reps, Supine ?Gluteal Sets: AROM, Both, 5 reps, Supine ?Heel Slides: AAROM, Both, 5 reps, Supine ?Straight Leg Raises: AAROM, Both, 5 reps, Supine ? ?  ?General Comments   ?  ?  ? ?Pertinent Vitals/Pain Pain Assessment ?Pain Assessment: Faces ?Faces Pain Scale: Hurts whole lot ?Pain Location: R hip to move on bed ?Pain Descriptors / Indicators: Guarding, Grimacing, Operative site guarding, Tender ?Pain Intervention(s): Limited activity within patient's tolerance, Monitored during session, Repositioned  ? ? ?Home Living   ?  ?  ?  ?  ?  ?  ?  ?  ?  ?   ?  ?Prior Function    ?  ?  ?   ? ?PT Goals (current goals can now be found in the care plan section) Acute Rehab PT Goals ?Patient Stated Goal: to go home with 24 hour care ?Progress towards PT goals: Progressing toward goals ? ?  ?Frequency ? ? ? Min 5X/week ? ? ? ?  ?PT Plan Current plan remains appropriate  ? ? ?Co-evaluation   ?  ?  ?  ?  ? ?  ?AM-PAC PT "6 Clicks" Mobility   ?Outcome Measure ? Help needed turning from your back to your side while in a flat bed without using bedrails?: A Lot ?Help  needed moving from lying on your back to sitting on the side of a flat bed without using bedrails?: Total ?Help needed moving to and from a bed to a chair (including a wheelchair)?: Total ?Help needed standing up from a chair using your arms (e.g., wheelchair or bedside chair)?: Total ?Help needed to walk in hospital room?: Total ?Help needed climbing 3-5 steps with a railing? : Total ?6 Click Score: 7 ? ?  ?End of Session Equipment Utilized During Treatment: Gait belt;Oxygen ?Activity Tolerance: Patient limited by fatigue;Patient limited by pain ?Patient left: with call bell/phone within reach;with family/visitor present;in chair;with chair alarm set ?Nurse Communication: Mobility status ?PT Visit Diagnosis: Muscle weakness (generalized) (M62.81);Other abnormalities of gait and mobility (R26.89);Pain;Dizziness and giddiness (R42);History of falling (Z91.81) ?Pain - Right/Left: Right ?Pain - part of body: Hip;Knee ?  ? ? ?  Time: 2518-9842 ?PT Time Calculation (min) (ACUTE ONLY): 38 min ? ?Charges:  $Therapeutic Exercise: 8-22 mins ?$Therapeutic Activity: 23-37 mins          ?          ? ?Lamija Besse M,PT ?Acute Rehab Services ?[phone] ?[phone] (pager)  ? ? ?Kiora Hallberg F Pammie Chirino ?01/06/2022, 1:32 PM ? ?

## 2022-01-06 NOTE — Plan of Care (Signed)
?  Problem: Education: ?Goal: Knowledge of General Education information will improve ?Description: Including pain rating scale, medication(s)/side effects and non-pharmacologic comfort measures ?Outcome: Progressing ?  ?Problem: Coping: ?Goal: Level of anxiety will decrease ?Outcome: Progressing ?  ?Problem: Pain Managment: ?Goal: General experience of comfort will improve ?Outcome: Progressing ?  ?Problem: Safety: ?Goal: Ability to remain free from injury will improve ?Outcome: Progressing ?  ?Problem: Skin Integrity: ?Goal: Risk for impaired skin integrity will decrease ?Outcome: Progressing ?  ?Problem: Activity: ?Goal: Risk for activity intolerance will decrease ?Outcome: Not Progressing ?  ?

## 2022-01-06 NOTE — Plan of Care (Signed)

## 2022-01-06 NOTE — Progress Notes (Addendum)
?  Subjective: ?Patient stable.  Pain controlled.  Was up out of bed to chair yesterday.  On Eliquis for DVT prophylaxis  ? ?Objective: ?Vital signs in last 24 hours: ?Temp:  [98 ?F (36.7 ?C)-98.8 ?F (37.1 ?C)] 98.8 ?F (37.1 ?C) (04/13 2135) ?Pulse Rate:  [73] 73 (04/13 2354) ?Resp:  [16-17] 16 (04/13 2354) ?BP: (112-126)/(60-64) 126/60 (04/13 2354) ?SpO2:  [97 %-98 %] 97 % (04/13 2354) ? ?Intake/Output from previous day: ?04/13 0701 - 04/14 0700 ?In: 720 [P.O.:720] ?Out: -  ?Intake/Output this shift: ?No intake/output data recorded. ? ?Exam: ? ?Sensation intact distally ?Dorsiflexion/Plantar flexion intact ? ?Labs: ?Recent Labs  ?  01/04/22 ?0040 01/05/22 ?2248 01/06/22 ?0316  ?HGB 8.4* 7.4* 7.2*  ? ?Recent Labs  ?  01/05/22 ?0218 01/06/22 ?0316  ?WBC 11.0* 11.0*  ?RBC 2.41* 2.46*  ?HCT 21.8* 22.7*  ?PLT 152 190  ? ?Recent Labs  ?  01/05/22 ?0218 01/06/22 ?0316  ?NA 138 136  ?K 4.7 4.4  ?CL 112* 109  ?CO2 21* 23  ?BUN 25* 25*  ?CREATININE 1.64* 1.27*  ?GLUCOSE 123* 120*  ?CALCIUM 8.2* 8.3*  ? ?No results for input(s): LABPT, INR in the last 72 hours. ? ?Assessment/Plan: ?Plan at this time is discharge home once medically stable.  Hemoglobin is trending down to 7.2.  Not unexpected with upstroke fracture.  I would see where his hemoglobin hits tomorrow and if it goes below 7 for this patient I would transfuse 1 unit of packed red blood cells and plan on discharge once he is safe with physical therapy.  He is okay for touchdown weightbearing for transfers until first postop visit.  Eliquis for DVT prophylaxis.  Oxycodone and Robaxin prescriptions sent to pharmacy ? ? ?Anderson Malta ?01/06/2022, 7:55 AM  ? ? ?  ?

## 2022-01-06 NOTE — Progress Notes (Signed)
Mobility Specialist: Progress Note ? ? 01/06/22 1235  ?Mobility  ?Activity Transferred from chair to bed  ?Level of Assistance +2 (takes two people)  ?Assistive Device Sliding board  ?RLE Weight Bearing NWB  ?Activity Response Tolerated well  ?$Mobility charge 1 Mobility  ? ?Pt assisted back to bed per request with help from RN. Pt required frequent verbal cues for hand placement and posture during transfer but tolerated well. Pt back in bed with call bell at his side and RN present in the room.  ? ?Jeffrey Stephens ?Mobility Specialist ?Mobility Specialist [place]: [phone] ?Mobility Specialist [place]: [phone] ? ?

## 2022-01-06 NOTE — Progress Notes (Signed)
?PROGRESS NOTE ? ? ? ?Jeffrey Stephens  BWI:[id number] DOB: 1941/02/24 DOA: 01/01/2022 ?PCP: Lavone Orn, MD  ? ? ?Brief Narrative:  ?81 year old with paroxysmal a flutter on Eliquis, history of DVT, AAA, history of lung cancer status post lobectomy in 2017, orthostatic hypotension, COPD with emphysema, chronic hypoxic respiratory failure on intermittent home oxygen, depression, GERD presented to the ER with right hip pain after mechanical fall.  He was found to have right intertrochanteric proximal right femur fracture. ?4/12, overnight tachycardia with some drop in blood pressure, 500 mL fluid given.  He does have a history of chronic A-flutter , sotalol caused bradycardia so not on any rate control medicine.  ? ? ?Assessment & Plan: ?  ?Closed traumatic right intertrochanteric femoral neck fracture: ?ORIF with IM nailing 4/11, Dr. Marlou Sa ?DVT prophylaxis, resumed Eliquis ?Pain management with IV and oral opiates, dilaudid , oxycodone and muscle relaxant along with laxatives.  ?Nonweightbearing right lower extremity. ?Decided to go home.  Continue to work with therapy to gain more confidence today. ? ?Acute blood loss anemia, anticipated blood loss from long bone fracture and surgery: ?Hemoglobin dropped 11-8.4-7.4-7.2.  No evidence of active blood loss. ?Recheck tomorrow morning.  Transfuse if less than 7.  Continue Eliquis as this is anticipated blood loss. ?We will transfuse 1 dose of IV iron today. ? ?COPD with emphysema, chronic hypoxemic respiratory failure: On 1 to 2 L of oxygen.  Stable. ? ?Paroxysmal a flutter with RVR: ?Started on Cardizem 60 mg every 6 hours, heart rate is controlled and remains in a flutter.  Asymptomatic.  Eliquis resumed.  Continue metoprolol as needed to control the heart rate.  We will avoid high doses of rate control medications with history of hypotension and bradycardia.  ?Heart rate is well controlled, changed to Cardizem CD 180 mg daily. ?Echocardiogram with normal ejection  fraction. ? ?History of DVT: Eliquis as above. ? ?AKI on CKD stage IIIa: Stabilizing.   ? ?Constipation: Multifactorial.  Aggressive bowel regimen today with lactulose and Dulcolax suppository. ? ? ? ?DVT prophylaxis: SCDs Start: 01/04/22 0007 ?SCDs Start: 01/02/22 0420 ?apixaban (ELIQUIS) tablet 5 mg  ? ?Code Status: Full code ?Family Communication: Wife and daughter at the bedside. ?Disposition Plan: Status is: Inpatient ?Remains inpatient appropriate because: Significant constipation, postop pain , unsafe discharge. ?  ? ? ?Consultants:  ?Orthopedics ? ?Procedures:  ?Right femoral intramedullary nail ? ?Antimicrobials:  ?Perioperative antibiotics ? ? ?Subjective: ? ?Patient seen and examined.  Wife and daughter at the bedside.  Pain is controlled. ?Telemetry shows a flutter with heart rate 60-80.  Without any chest pain or palpitations. ?No BM since admission, feels bloated and distended. ? ?Objective: ?Vitals:  ? 01/05/22 1434 01/05/22 2135 01/05/22 2354 01/06/22 0857  ?BP: 112/60 117/64 126/60 133/62  ?Pulse:  73 73 75  ?Resp:  17 16 15   ?Temp: 98 ?F (36.7 ?C) 98.8 ?F (37.1 ?C)    ?TempSrc: Oral Oral    ?SpO2:  98% 97% 98%  ?Weight:      ?Height:      ? ? ?Intake/Output Summary (Last 24 hours) at 01/06/2022 1105 ?Last data filed at 01/06/2022 0900 ?Gross per 24 hour  ?Intake 480 ml  ?Output 750 ml  ?Net -270 ml  ? ?Filed Weights  ? 01/02/22 1305 01/03/22 1448  ?Weight: 104 kg 104.3 kg  ? ? ?Examination: ? ?General exam: Appears moderately distressed due to constipation. ?Respiratory system: Clear to auscultation. Respiratory effort normal.  No added sounds.  On 2  L oxygen. ?Cardiovascular system: S1 & S2 heard, irregularly irregular. ?Gastrointestinal system: Abdomen is nondistended, soft and nontender. No organomegaly or masses felt. Normal bowel sounds heard. ?Central nervous system: Alert and oriented. No focal neurological deficits. ?Extremities: Symmetric 5 x 5 power. ?Skin:  ?Right lateral thigh incision  with dressing, minimal surrounding swelling and induration as expected postop.  Distal neurovascular status intact. ? ? ? ?Data Reviewed: I have personally reviewed following labs and imaging studies ? ?CBC: ?Recent Labs  ?Lab 01/02/22 ?0118 01/02/22 ?4193 01/03/22 ?0456 01/04/22 ?0040 01/05/22 ?7902 01/06/22 ?0316  ?WBC 15.2* 15.6* 14.1* 16.6* 11.0* 11.0*  ?NEUTROABS 13.1*  --   --   --   --   --   ?HGB 12.8* 11.7* 10.4* 8.4* 7.4* 7.2*  ?HCT 39.2 36.1* 32.5* 25.9* 21.8* 22.7*  ?MCV 90.5 90.7 91.5 91.8 90.5 92.3  ?PLT 223 208 198 157 152 190  ? ?Basic Metabolic Panel: ?Recent Labs  ?Lab 01/02/22 ?0118 01/03/22 ?0456 01/05/22 ?4097 01/06/22 ?0316  ?NA 140 140 138 136  ?K 4.2 4.8 4.7 4.4  ?CL 109 109 112* 109  ?CO2 24 25 21* 23  ?GLUCOSE 160* 127* 123* 120*  ?BUN 13 22 25* 25*  ?CREATININE 1.25* 1.61* 1.64* 1.27*  ?CALCIUM 8.8* 8.6* 8.2* 8.3*  ? ?GFR: ?Estimated Creatinine Clearance: 56.1 mL/min (A) (by C-G formula based on SCr of 1.27 mg/dL (H)). ?Liver Function Tests: ?Recent Labs  ?Lab 01/02/22 ?0118  ?AST 15  ?ALT 11  ?ALKPHOS 41  ?BILITOT 1.0  ?PROT 6.5  ?ALBUMIN 3.4*  ? ?No results for input(s): LIPASE, AMYLASE in the last 168 hours. ?No results for input(s): AMMONIA in the last 168 hours. ?Coagulation Profile: ?No results for input(s): INR, PROTIME in the last 168 hours. ?Cardiac Enzymes: ?No results for input(s): CKTOTAL, CKMB, CKMBINDEX, TROPONINI in the last 168 hours. ?BNP (last 3 results) ?No results for input(s): PROBNP in the last 8760 hours. ?HbA1C: ?No results for input(s): HGBA1C in the last 72 hours. ?CBG: ?No results for input(s): GLUCAP in the last 168 hours. ?Lipid Profile: ?No results for input(s): CHOL, HDL, LDLCALC, TRIG, CHOLHDL, LDLDIRECT in the last 72 hours. ?Thyroid Function Tests: ?No results for input(s): TSH, T4TOTAL, FREET4, T3FREE, THYROIDAB in the last 72 hours. ?Anemia Panel: ?No results for input(s): VITAMINB12, FOLATE, FERRITIN, TIBC, IRON, RETICCTPCT in the last 72 hours. ?Sepsis  Labs: ?No results for input(s): PROCALCITON, LATICACIDVEN in the last 168 hours. ? ?Recent Results (from the past 240 hour(s))  ?SARS CORONAVIRUS 2 (TAT 6-24 HRS) Nasopharyngeal Nasopharyngeal Swab     Status: None  ? Collection Time: 01/02/22  5:54 AM  ? Specimen: Nasopharyngeal Swab  ?Result Value Ref Range Status  ? SARS Coronavirus 2 NEGATIVE NEGATIVE Final  ?  Comment: (NOTE) ?SARS-CoV-2 target nucleic acids are NOT DETECTED. ? ?The SARS-CoV-2 RNA is generally detectable in upper and lower ?respiratory specimens during the acute phase of infection. Negative ?results do not preclude SARS-CoV-2 infection, do not rule out ?co-infections with other pathogens, and should not be used as the ?sole basis for treatment or other patient management decisions. ?Negative results must be combined with clinical observations, ?patient history, and epidemiological information. The expected ?result is Negative. ? ?Fact Sheet for Patients: ?SugarRoll.be ? ?Fact Sheet for Healthcare Providers: ?https://www.woods-mathews.com/ ? ?This test is not yet approved or cleared by the [place] FDA and  ?has been authorized for detection and/or diagnosis of SARS-CoV-2 by ?FDA under an Emergency Use Authorization (EUA). This EUA will remain  ?  in effect (meaning this test can be used) for the duration of the ?COVID-19 declaration under Se ction 564(b)(1) of the Act, 21 U.S.C. ?section 360bbb-3(b)(1), unless the authorization is terminated or ?revoked sooner. ? ?Performed at [facility], [place] [address], [place], [place] ?[address] ?  ?Surgical pcr screen     Status: None  ? Collection Time: 01/03/22  3:33 AM  ? Specimen: Nasal Mucosa; Nasal Swab  ?Result Value Ref Range Status  ? MRSA, PCR NEGATIVE NEGATIVE Final  ? Staphylococcus aureus NEGATIVE NEGATIVE Final  ?  Comment: (NOTE) ?The Xpert SA Assay (FDA approved for NASAL specimens in patients 54 ?years of age and older), is one component of  a comprehensive ?surveillance program. It is not intended to diagnose infection nor to ?guide or monitor treatment. ?Performed at [facility], [place] [address], [place], [place] ?[address] ?  ?  ? ? ?

## 2022-01-07 DIAGNOSIS — S72001A Fracture of unspecified part of neck of right femur, initial encounter for closed fracture: Secondary | ICD-10-CM | POA: Diagnosis not present

## 2022-01-07 DIAGNOSIS — J431 Panlobular emphysema: Secondary | ICD-10-CM | POA: Diagnosis not present

## 2022-01-07 DIAGNOSIS — I4892 Unspecified atrial flutter: Secondary | ICD-10-CM | POA: Diagnosis not present

## 2022-01-07 LAB — BASIC METABOLIC PANEL
Anion gap: 5 (ref 5–15)
BUN: 24 mg/dL — ABNORMAL HIGH (ref 8–23)
CO2: 24 mmol/L (ref 22–32)
Calcium: 8.4 mg/dL — ABNORMAL LOW (ref 8.9–10.3)
Chloride: 112 mmol/L — ABNORMAL HIGH (ref 98–111)
Creatinine, Ser: 1.07 mg/dL (ref 0.61–1.24)
GFR, Estimated: >60 mL/min (ref >60)
Glucose, Bld: 110 mg/dL — ABNORMAL HIGH (ref 70–99)
Potassium: 3.9 mmol/L (ref 3.5–5.1)
Sodium: 141 mmol/L (ref 135–145)

## 2022-01-07 LAB — CBC WITH DIFFERENTIAL/PLATELET
Abs Immature Granulocytes: 0.08 10*3/uL — ABNORMAL HIGH (ref 0.00–0.07)
Basophils Absolute: 0 10*3/uL (ref 0.0–0.1)
Basophils Relative: 0 %
Eosinophils Absolute: 0.1 10*3/uL (ref 0.0–0.5)
Eosinophils Relative: 1 %
HCT: 23.9 % — ABNORMAL LOW (ref 39.0–52.0)
Hemoglobin: 7.6 g/dL — ABNORMAL LOW (ref 13.0–17.0)
Immature Granulocytes: 1 %
Lymphocytes Relative: 9 %
Lymphs Abs: 1.1 10*3/uL (ref 0.7–4.0)
MCH: 28.8 pg (ref 26.0–34.0)
MCHC: 31.8 g/dL (ref 30.0–36.0)
MCV: 90.5 fL (ref 80.0–100.0)
Monocytes Absolute: 1.3 10*3/uL — ABNORMAL HIGH (ref 0.1–1.0)
Monocytes Relative: 11 %
Neutro Abs: 9.5 10*3/uL — ABNORMAL HIGH (ref 1.7–7.7)
Neutrophils Relative %: 78 %
Platelets: 212 10*3/uL (ref 150–400)
RBC: 2.64 MIL/uL — ABNORMAL LOW (ref 4.22–5.81)
RDW: 17.1 % — ABNORMAL HIGH (ref 11.5–15.5)
WBC: 12.1 10*3/uL — ABNORMAL HIGH (ref 4.0–10.5)
nRBC: 0.5 % — ABNORMAL HIGH (ref 0.0–0.2)

## 2022-01-07 MED ORDER — SODIUM CHLORIDE 0.9 % IV SOLN
250.0000 mg | Freq: Once | INTRAVENOUS | Status: AC
  Administered 2022-01-07: 250 mg via INTRAVENOUS
  Filled 2022-01-07: qty 20

## 2022-01-07 NOTE — Discharge Instructions (Signed)

## 2022-01-07 NOTE — Progress Notes (Signed)
?PROGRESS NOTE ? ? ? ?Jeffrey Stephens  ZHG:[id number] DOB: 1941/01/20 DOA: 01/01/2022 ?PCP: Lavone Orn, MD  ? ? ?Brief Narrative:  ?81 year old with paroxysmal a flutter on Eliquis, history of DVT, AAA, history of lung cancer status post lobectomy in 2017, orthostatic hypotension, COPD with emphysema, chronic hypoxic respiratory failure on intermittent home oxygen, depression, GERD presented to the ER with right hip pain after mechanical fall.  He was found to have right intertrochanteric proximal right femur fracture. ?4/12, overnight tachycardia with some drop in blood pressure, 500 mL fluid given.  He does have a history of chronic A-flutter , sotalol caused bradycardia so not on any rate control medicine.  ?4/14, developed constipation and ileus, improved with aggressive bowel regimen. ? ? ?Assessment & Plan: ?  ?Closed traumatic right intertrochanteric femoral neck fracture: ?ORIF with IM nailing 4/11, Dr. Marlou Sa.  Surgically stable. ?DVT prophylaxis, resumed Eliquis ?Pain management with oral opiates, dilaudid , oxycodone and muscle relaxant along with laxatives.  ?Toe-touch weightbearing right lower extremity. ?Decided to go home.  Anticipate tomorrow. ? ?Acute blood loss anemia, anticipated blood loss from long bone fracture and surgery: ?Hemoglobin dropped 11-8.4-7.4-7.2-1 unit of IV iron, hemoglobin 7.6 today.  We will give him another IV iron today.  Recheck tomorrow morning. ?Continue Eliquis as this is anticipated blood loss. ? ?COPD with emphysema, chronic hypoxemic respiratory failure: On 1 to 2 L of oxygen.  Stable. ? ?Paroxysmal a flutter with RVR: Good rate control after starting Cardizem.  Echocardiogram with normal ejection fraction.  Tolerating without hypotension. ?We will discharge on Cardizem 180 mg daily. ? ?History of DVT: Eliquis as above. ? ?AKI on CKD stage IIIa: Stabilizing.  Normalizing. ? ?Ileus with constipation: Multifactorial.   ?Some improvement with aggressive bowel regimen  including lactulose, Fleet enema.  Continue lactulose today.  Advance to regular diet. ? ? ? ?DVT prophylaxis: SCDs Start: 01/04/22 0007 ?SCDs Start: 01/02/22 0420 ?apixaban (ELIQUIS) tablet 5 mg  ? ?Code Status: Full code ?Family Communication: Daughter at the bedside. ?Disposition Plan: Status is: Inpatient ?Remains inpatient appropriate because: Significant constipation and ileus. ?  ? ? ?Consultants:  ?Orthopedics ? ?Procedures:  ?Right femoral intramedullary nail ? ?Antimicrobials:  ?Perioperative antibiotics ? ? ?Subjective: ? ?Seen and examined.  Some improvement on abdominal distention today.  Denies any chest pain shortness of breath or palpitations.  Daughter at the bedside.  Yesterday, he was not able to get out of the bed well.  He wants to work with physical therapy again today to get more confidence.  Abdominal discomfort is better but he still thinks he may have more stool inside.  No other overnight events. ? ?Objective: ?Vitals:  ? 01/06/22 1938 01/07/22 0038 01/07/22 0456 01/07/22 0736  ?BP: 120/64 122/64 116/66 116/60  ?Pulse: 76 75 79 77  ?Resp: 16 15 16 18   ?Temp: 97.6 ?F (36.4 ?C) 97.7 ?F (36.5 ?C) 98.2 ?F (36.8 ?C) 97.9 ?F (36.6 ?C)  ?TempSrc: Oral Oral  Oral  ?SpO2: 97% 98% 94% 94%  ?Weight:      ?Height:      ? ? ?Intake/Output Summary (Last 24 hours) at 01/07/2022 1113 ?Last data filed at 01/07/2022 0500 ?Gross per 24 hour  ?Intake 270.19 ml  ?Output 1000 ml  ?Net -729.81 ml  ? ?Filed Weights  ? 01/02/22 1305 01/03/22 1448  ?Weight: 104 kg 104.3 kg  ? ? ?Examination: ? ?General exam: Appears comfortable today. ?Respiratory system: Clear to auscultation. Respiratory effort normal.  No added sounds.  On  2 L oxygen. ?Cardiovascular system: S1 & S2 heard, regularly irregular. ?Gastrointestinal system: Soft.  Mildly distended but nontender today.  Bowel sound present. ?Central nervous system: Alert and oriented. No focal neurological deficits. ?Extremities: Symmetric 5 x 5 power. ?Skin:  ?Right  lateral thigh incision with dressing intact.  Anticipated surgical swelling. ? ? ?Data Reviewed: I have personally reviewed following labs and imaging studies ? ?CBC: ?Recent Labs  ?Lab 01/02/22 ?0118 01/02/22 ?4235 01/03/22 ?0456 01/04/22 ?0040 01/05/22 ?3614 01/06/22 ?4315 01/07/22 ?0150  ?WBC 15.2*   < > 14.1* 16.6* 11.0* 11.0* 12.1*  ?NEUTROABS 13.1*  --   --   --   --   --  9.5*  ?HGB 12.8*   < > 10.4* 8.4* 7.4* 7.2* 7.6*  ?HCT 39.2   < > 32.5* 25.9* 21.8* 22.7* 23.9*  ?MCV 90.5   < > 91.5 91.8 90.5 92.3 90.5  ?PLT 223   < > 198 157 152 190 212  ? < > = values in this interval not displayed.  ? ?Basic Metabolic Panel: ?Recent Labs  ?Lab 01/02/22 ?0118 01/03/22 ?0456 01/05/22 ?4008 01/06/22 ?6761 01/07/22 ?0150  ?NA 140 140 138 136 141  ?K 4.2 4.8 4.7 4.4 3.9  ?CL 109 109 112* 109 112*  ?CO2 24 25 21* 23 24  ?GLUCOSE 160* 127* 123* 120* 110*  ?BUN 13 22 25* 25* 24*  ?CREATININE 1.25* 1.61* 1.64* 1.27* 1.07  ?CALCIUM 8.8* 8.6* 8.2* 8.3* 8.4*  ? ?GFR: ?Estimated Creatinine Clearance: 66.6 mL/min (by C-G formula based on SCr of 1.07 mg/dL). ?Liver Function Tests: ?Recent Labs  ?Lab 01/02/22 ?0118  ?AST 15  ?ALT 11  ?ALKPHOS 41  ?BILITOT 1.0  ?PROT 6.5  ?ALBUMIN 3.4*  ? ?No results for input(s): LIPASE, AMYLASE in the last 168 hours. ?No results for input(s): AMMONIA in the last 168 hours. ?Coagulation Profile: ?No results for input(s): INR, PROTIME in the last 168 hours. ?Cardiac Enzymes: ?No results for input(s): CKTOTAL, CKMB, CKMBINDEX, TROPONINI in the last 168 hours. ?BNP (last 3 results) ?No results for input(s): PROBNP in the last 8760 hours. ?HbA1C: ?No results for input(s): HGBA1C in the last 72 hours. ?CBG: ?No results for input(s): GLUCAP in the last 168 hours. ?Lipid Profile: ?No results for input(s): CHOL, HDL, LDLCALC, TRIG, CHOLHDL, LDLDIRECT in the last 72 hours. ?Thyroid Function Tests: ?No results for input(s): TSH, T4TOTAL, FREET4, T3FREE, THYROIDAB in the last 72 hours. ?Anemia Panel: ?No  results for input(s): VITAMINB12, FOLATE, FERRITIN, TIBC, IRON, RETICCTPCT in the last 72 hours. ?Sepsis Labs: ?No results for input(s): PROCALCITON, LATICACIDVEN in the last 168 hours. ? ?Recent Results (from the past 240 hour(s))  ?SARS CORONAVIRUS 2 (TAT 6-24 HRS) Nasopharyngeal Nasopharyngeal Swab     Status: None  ? Collection Time: 01/02/22  5:54 AM  ? Specimen: Nasopharyngeal Swab  ?Result Value Ref Range Status  ? SARS Coronavirus 2 NEGATIVE NEGATIVE Final  ?  Comment: (NOTE) ?SARS-CoV-2 target nucleic acids are NOT DETECTED. ? ?The SARS-CoV-2 RNA is generally detectable in upper and lower ?respiratory specimens during the acute phase of infection. Negative ?results do not preclude SARS-CoV-2 infection, do not rule out ?co-infections with other pathogens, and should not be used as the ?sole basis for treatment or other patient management decisions. ?Negative results must be combined with clinical observations, ?patient history, and epidemiological information. The expected ?result is Negative. ? ?Fact Sheet for Patients: ?SugarRoll.be ? ?Fact Sheet for Healthcare Providers: ?https://www.woods-mathews.com/ ? ?This test is not yet approved or  cleared by the [place] and  ?has been authorized for detection and/or diagnosis of SARS-CoV-2 by ?FDA under an Emergency Use Authorization (EUA). This EUA will remain  ?in effect (meaning this test can be used) for the duration of the ?COVID-19 declaration under Se ction 564(b)(1) of the Act, 21 U.S.C. ?section 360bbb-3(b)(1), unless the authorization is terminated or ?revoked sooner. ? ?Performed at [facility], [place] [address], [place], [place] ?[address] ?  ?Surgical pcr screen     Status: None  ? Collection Time: 01/03/22  3:33 AM  ? Specimen: Nasal Mucosa; Nasal Swab  ?Result Value Ref Range Status  ? MRSA, PCR NEGATIVE NEGATIVE Final  ? Staphylococcus aureus NEGATIVE NEGATIVE Final  ?  Comment: (NOTE) ?The  Xpert SA Assay (FDA approved for NASAL specimens in patients 73 ?years of age and older), is one component of a comprehensive ?surveillance program. It is not intended to diagnose infection nor to ?guide or monitor tre

## 2022-01-07 NOTE — Progress Notes (Signed)
Physical Therapy Treatment ?Patient Details ?Name: Jeffrey Stephens ?MRN: [id number] ?DOB: 1941-08-10 ?Today's Date: 01/07/2022 ? ? ?History of Present Illness 81 yo male admitted 4/9 with onset of fall at home after history of limited mobility was seen for Right hip intertrochanteric / subtrochanteric fracture fixation with reduction and intramedullary nailing , 4/11.  PMHx:  AAA, basal cell CA, urinary frequency, GERD, DVT LLE, cerebral aneurysm, kidney stones, lung CA, PAF, RA, stroke, TIA, depression. ? ?  ?PT Comments  ? ? Pt received in supine, agreeable to therapy session and pt spouse/daughter present and receptive to instruction on RLE TDWB precs and need to utilize RW vs SB for transfers depending on compliance with TDWB vs NWB for safety if unable to maintain TDWB. Discussion on technique for SPT with RW vs SB pending progress with seated/standing balance given WB restrictions. HEP discussed, handout printed to reinforce continued supine ROM. Pt SpO2 desat at rest below 92% goal so 2L O2 [place] re-donned, per pt he has O2 at home. Pt noted to be incontinent when initiating OOB mobility so NT notified he will need bath and defer OOB mobility until after pt bathed. Pt continues to benefit from PT services to progress toward functional mobility goals. Plan to assess SB transfer vs stand pivot later in day once cleaned up.   ?Recommendations for follow up therapy are one component of a multi-disciplinary discharge planning process, led by the attending physician.  Recommendations may be updated based on patient status, additional functional criteria and insurance authorization. ? ?Follow Up Recommendations ? Skilled nursing-short term rehab (<3 hours/day) (pending progress; per family plan for home so would benefit from [facility] services) ?  ?  ?Assistance Recommended at Discharge Frequent or constant Supervision/Assistance  ?Patient can return home with the following Two people to help with walking and/or  transfers;Two people to help with bathing/dressing/bathroom;Assistance with cooking/housework;Direct supervision/assist for medications management;Direct supervision/assist for financial management;Assist for transportation;Help with stairs or ramp for entrance ?  ?Equipment Recommendations ? Rolling walker (2 wheels);BSC/3in1;Wheelchair (measurements PT);Wheelchair cushion (measurements PT);Other (comment) (shower chair per family, drop arm BSC (3N1), sliding board)  ?  ?Recommendations for Other Services   ? ? ?  ?Precautions / Restrictions Precautions ?Precautions: Fall ?Precaution Comments: watch O2 sats, goal 92% and > ?Restrictions ?Weight Bearing Restrictions: Yes ?RLE Weight Bearing: Non weight bearing ?Other Position/Activity Restrictions: TDWB to transfer which pt may not be able to control  ?  ? ?Mobility ? Bed Mobility ?  ?  ?  ?  ?  ?  ?  ?General bed mobility comments: defer -pt found to be incontinent when initiating and wanting a bath so NT notified and plan to progress OOB later in day ?  ? ?Transfers ?  ?  ?  ?  ?  ?  ?  ?  ?  ?General transfer comment: discussion on TDWB precs vs NWB if unable to maintain TWB and possible transfer techniques within precs including SPT vs SB transfer, daughter/pt receptive ?  ? ?Ambulation/Gait ?  ?  ?  ?  ?  ?  ?  ?General Gait Details: unable to attempt ? ?  ?   ?Cognition Arousal/Alertness: Awake/alert ?Behavior During Therapy: Anxious ?Overall Cognitive Status: History of cognitive impairments - at baseline ?  ?  ?  ?  ?  ?  ?  ?  ?  ?  ?  ?  ?  ?  ?  ?  ?General Comments: has had a stroke  and aneurysm, mult cognitive insults; pt at times making jokes possibly to cover deficits; pt found to be incontinent but had not alerted staff (did have lactulose in AM and enema previous date) ?  ?  ? ?  ?Exercises General Exercises - Lower Extremity ?Ankle Circles/Pumps: AROM, Both, 10 reps, Supine ?Quad Sets: AROM, Both, 5 reps, Supine ? ?  ?General Comments General  comments (skin integrity, edema, etc.): SpO2 found to be 88% on RA (pt on RA on PTA arrival to room), per chart review SpO2 goal 92% and greater so re-donned 2L O2 [place] and SpO2 >92%; reviewed pursed-lip breathing techniques ?  ?  ? ?Pertinent Vitals/Pain Pain Assessment ?Pain Assessment: Faces ?Faces Pain Scale: Hurts even more ?Pain Location: R hip to move on bed ?Pain Descriptors / Indicators: Guarding, Grimacing, Operative site guarding, Tender ?Pain Intervention(s): Monitored during session, Limited activity within patient's tolerance  ? ? ? ?PT Goals (current goals can now be found in the care plan section) Acute Rehab PT Goals ?Patient Stated Goal: to go home with 24 hour care ?PT Goal Formulation: With patient/family ?Time For Goal Achievement: 01/19/22 ?Progress towards PT goals: Progressing toward goals ? ?  ?Frequency ? ? ? Min 5X/week ? ? ? ?  ?PT Plan Current plan remains appropriate  ? ? ?   ?AM-PAC PT "6 Clicks" Mobility   ?Outcome Measure ? Help needed turning from your back to your side while in a flat bed without using bedrails?: A Lot ?Help needed moving from lying on your back to sitting on the side of a flat bed without using bedrails?: Total ?Help needed moving to and from a bed to a chair (including a wheelchair)?: Total ?Help needed standing up from a chair using your arms (e.g., wheelchair or bedside chair)?: Total ?Help needed to walk in hospital room?: Total ?Help needed climbing 3-5 steps with a railing? : Total ?6 Click Score: 7 ? ?  ?End of Session Equipment Utilized During Treatment: Oxygen ?Activity Tolerance: Other (comment) (limited due to bowel incontinence, plan to progress mobility later in day) ?Patient left: with call bell/phone within reach;with family/visitor present;in bed;with bed alarm set ?Nurse Communication: Mobility status;Other (comment) (IV beeping-complete (RN notified); NT notified pt needing a bath prior to OOB mobility.) ?PT Visit Diagnosis: Muscle weakness  (generalized) (M62.81);Other abnormalities of gait and mobility (R26.89);Pain;Dizziness and giddiness (R42);History of falling (Z91.81) ?Pain - Right/Left: Right ?Pain - part of body: Hip;Knee ?  ? ? ?Time: 5277-8242 ?PT Time Calculation (min) (ACUTE ONLY): 18 min ? ?Charges:  $Therapeutic Activity: 8-22 mins          ?          ? ?Argelio Granier P., PTA ?Acute Rehabilitation Services ?Secure Chat Preferred 9a-5:30pm ?Office: [phone]  ? ? ?Kalaysia Demonbreun M Olin Gurski ?01/07/2022, 2:53 PM ? ?

## 2022-01-07 NOTE — Plan of Care (Signed)
?  Problem: Education: ?Goal: Knowledge of General Education information will improve ?Description: Including pain rating scale, medication(s)/side effects and non-pharmacologic comfort measures ?Outcome: Progressing ?  ?Problem: Pain Managment: ?Goal: General experience of comfort will improve ?Outcome: Progressing ?  ?Problem: Safety: ?Goal: Ability to remain free from injury will improve ?Outcome: Progressing ?  ?Problem: Skin Integrity: ?Goal: Risk for impaired skin integrity will decrease ?Outcome: Progressing ?  ?Problem: Activity: ?Goal: Risk for activity intolerance will decrease ?Outcome: Not Progressing ?  ?

## 2022-01-07 NOTE — TOC CM/SW Note (Signed)
Nursing made writer aware that patient has not received DME yet. Telephone call made to [facility] to request equipment be delivered to room. Weekday RNCM previously requested DME on 01/05/22 with [employer]. ? ?Marthenia Rolling, MSN, RN,BSN ?Inpatient [facility] Case Manager ?[phone]   ?

## 2022-01-07 NOTE — Progress Notes (Signed)
Physical Therapy Treatment ?Patient Details ?Name: Jeffrey Stephens ?MRN: [id number] ?DOB: Jan 07, 1941 ?Today's Date: 01/07/2022 ? ? ?History of Present Illness 81 yo male admitted 4/9 with onset of fall at home after history of limited mobility was seen for Right hip intertrochanteric / subtrochanteric fracture fixation with reduction and intramedullary nailing , 4/11.  PMHx:  AAA, basal cell CA, urinary frequency, GERD, DVT LLE, cerebral aneurysm, kidney stones, lung CA, PAF, RA, stroke, TIA, depression. ? ?  ?PT Comments  ? ? Pt received in supine, eager to continue mobility with goal of pivotal transfer training. Pt dysnpeic on 2L O2 [place] with bed mobility so O2 increased to 4L/min for stand pivot transfer, SpO2 96% post-exertion on 4L and weaned back to 2L once in chair, maintaining 93%. Pt needing up to +2 modA for safety using RW for stand pivot transfer and manual cues for RLE advancement and TDWB precs, pt with good recall prior to and during transfer. Pt family members x3 present for instruction and able to assist with transfer. Also reviewed slide board/lateral scoot technique for return transfer if too fatigued to stand. Pt DOE 3/4 with pivotal transfer and needing greatly increased time to initiate 2/2 anxiety/fatigue. Pt continues to benefit from PT services to progress toward functional mobility goals.    ?Recommendations for follow up therapy are one component of a multi-disciplinary discharge planning process, led by the attending physician.  Recommendations may be updated based on patient status, additional functional criteria and insurance authorization. ? ?Follow Up Recommendations ? Skilled nursing-short term rehab (<3 hours/day) (pending progress; per family plan for home so would benefit from max [facility] services) ?  ?  ?Assistance Recommended at Discharge Frequent or constant Supervision/Assistance  ?Patient can return home with the following Two people to help with walking and/or transfers;Two people  to help with bathing/dressing/bathroom;Assistance with cooking/housework;Direct supervision/assist for medications management;Direct supervision/assist for financial management;Assist for transportation;Help with stairs or ramp for entrance ?  ?Equipment Recommendations ? Rolling walker (2 wheels);BSC/3in1;Wheelchair (measurements PT);Wheelchair cushion (measurements PT);Other (comment) (shower chair per family, drop arm BSC (3N1), sliding board)  ?  ?Recommendations for Other Services   ? ? ?  ?Precautions / Restrictions Precautions ?Precautions: Fall ?Precaution Comments: watch O2 sats, goal 92% and > ?Restrictions ?Weight Bearing Restrictions: Yes ?RLE Weight Bearing: Non weight bearing ?Other Position/Activity Restrictions: TDWB to transfer which pt may not be able to control  ?  ? ?Mobility ? Bed Mobility ?Overal bed mobility: Needs Assistance ?Bed Mobility: Rolling, Sidelying to Sit, Supine to Sit ?  ?Sidelying to sit: Mod assist, HOB elevated ?  ?  ?  ?General bed mobility comments: per family he will sleep in a recliner at home so utilized elevated HOB and handrail/bed pad to assist with transition to EOB ?  ? ?Transfers ?Overall transfer level: Needs assistance ?Equipment used: Rolling walker (2 wheels) ?Transfers: Sit to/from Stand, Bed to chair/wheelchair/BSC ?Sit to Stand: Mod assist, +2 physical assistance ?Stand pivot transfers: Mod assist, +2 physical assistance ?  ?  ?  ?  ?General transfer comment: pt able to state RLE TDWB and % restrictions prior to attempt, fair compliance with therapist using her foot behind his heel to maintain RLE advanced prior to standing; heavy reliance on RW and pt fatiguing as soon as he reached chair, pt able to place 1 hand on armrest prior to sitting with lowering assist for safety. DOE 3/4, SpO2 93% on 4L O2 [place] during mobility ?  ? ?Ambulation/Gait ?  ?  ?  ?  ?  ?  ?  ?  General Gait Details: pivotal transfer only as pt too fatigued after SPT to attempt another  stand ? ? ?Stairs ?  ?  ?  ?  ?  ? ? ?Wheelchair Mobility ?  ? ?Modified Rankin (Stroke Patients Only) ?  ? ? ?  ?Balance Overall balance assessment: Needs assistance, History of Falls ?Sitting-balance support: Feet supported, Bilateral upper extremity supported ?Sitting balance-Leahy Scale: Fair ?Sitting balance - Comments: min guard at most for seated balance; 1-2 UE support ?  ?Standing balance support: Bilateral upper extremity supported, During functional activity ?Standing balance-Leahy Scale: Poor ?Standing balance comment: +2 external assist and gait belt for stability, dtr/son able to physically assist; trunk flexed and posterior lean when fatigued needs mod postural cues ?  ?  ?  ?  ?  ?  ?  ?  ?  ?  ?  ?  ? ?  ?Cognition Arousal/Alertness: Awake/alert ?Behavior During Therapy: Anxious ?Overall Cognitive Status: History of cognitive impairments - at baseline ?  ?  ?  ?  ?  ?  ?  ?  ?  ?  ?  ?  ?  ?  ?  ?  ?General Comments: has had a stroke and aneurysm, mult cognitive insults; pt at times making jokes possibly to cover deficits; pt frequently asking PTA "do you have somewhere to be" although therapist not rushing him and allowing him time to recover. ?  ?  ? ?  ?Exercises General Exercises - Lower Extremity ?Ankle Circles/Pumps: AROM, Both, 10 reps, Supine ?Quad Sets: AROM, Both, 5 reps, Supine ?Long Arc Quad: AROM, Both, 10 reps, Seated ?Hip ABduction/ADduction:  (verbal review and handout given) ?Other Exercises ?Other Exercises: cues for IS use x5 reps ? ?  ?General Comments General comments (skin integrity, edema, etc.): SpO2 90% on 2L O2 [place] prior to transfer so increased to 4L/min which per family is his baseline for exertional; SpO2 96% on 4L O2 [name] once seated in recliner, 93% on 2L after a couple mins rest ?  ?  ? ?Pertinent Vitals/Pain Pain Assessment ?Pain Assessment: Faces ?Faces Pain Scale: Hurts even more ?Pain Location: R hip to move on bed ?Pain Descriptors / Indicators: Guarding, Grimacing,  Operative site guarding, Tender ?Pain Intervention(s): Limited activity within patient's tolerance, Monitored during session, Premedicated before session, Repositioned  ? ? ?Home Living   ?  ?  ?  ?  ?  ?  ?  ?  ?  ?   ?  ?Prior Function    ?  ?  ?   ? ?PT Goals (current goals can now be found in the care plan section) Acute Rehab PT Goals ?Patient Stated Goal: to go home with 24 hour care ?PT Goal Formulation: With patient/family ?Time For Goal Achievement: 01/19/22 ?Progress towards PT goals: Progressing toward goals ? ?  ?Frequency ? ? ? Min 5X/week ? ? ? ?  ?PT Plan Current plan remains appropriate  ? ? ?Co-evaluation   ?  ?  ?  ?  ? ?  ?AM-PAC PT "6 Clicks" Mobility   ?Outcome Measure ? Help needed turning from your back to your side while in a flat bed without using bedrails?: A Lot ?Help needed moving from lying on your back to sitting on the side of a flat bed without using bedrails?: A Lot ?Help needed moving to and from a bed to a chair (including a wheelchair)?: A Lot ?Help needed standing up from a chair using your arms (e.g., wheelchair  or bedside chair)?: A Lot ?Help needed to walk in hospital room?: Total ?Help needed climbing 3-5 steps with a railing? : Total ?6 Click Score: 10 ? ?  ?End of Session Equipment Utilized During Treatment: Oxygen;Gait belt ?Activity Tolerance: Other (comment);Patient tolerated treatment well (DOE 3/4) ?Patient left: with call bell/phone within reach;with family/visitor present;in chair (family will be present all day so alarm left off; RN notified) ?Nurse Communication: Mobility status;Other (comment);Weight bearing status (pt dtr/son able to assist with return transfer as they will be helping him at home; may do better with scoot transfer to return due to TDWB RLE) ?PT Visit Diagnosis: Muscle weakness (generalized) (M62.81);Other abnormalities of gait and mobility (R26.89);Pain;Dizziness and giddiness (R42);History of falling (Z91.81) ?Pain - Right/Left: Right ?Pain -  part of body: Hip;Knee ?  ? ? ?Time: 6015-6153 ?PT Time Calculation (min) (ACUTE ONLY): 32 min ? ?Charges:  $Therapeutic Activity: 23-37 mins          ?          ? ?Taggart Prasad P., PTA ?Acute Rehabilitation Services

## 2022-01-08 DIAGNOSIS — J431 Panlobular emphysema: Secondary | ICD-10-CM | POA: Diagnosis not present

## 2022-01-08 DIAGNOSIS — S72001A Fracture of unspecified part of neck of right femur, initial encounter for closed fracture: Secondary | ICD-10-CM | POA: Diagnosis not present

## 2022-01-08 DIAGNOSIS — I4892 Unspecified atrial flutter: Secondary | ICD-10-CM | POA: Diagnosis not present

## 2022-01-08 MED ORDER — SENNOSIDES-DOCUSATE SODIUM 8.6-50 MG PO TABS: 1.0000 | ORAL_TABLET | Freq: Two times a day (BID) | ORAL | 0 refills | Status: AC

## 2022-01-08 MED ORDER — DILTIAZEM HCL ER COATED BEADS 180 MG PO CP24: 180.0000 mg | ORAL_CAPSULE | Freq: Every day | ORAL | 2 refills | Status: DC

## 2022-01-08 MED ORDER — LACTULOSE 10 GM/15ML PO SOLN: 20.0000 g | Freq: Two times a day (BID) | ORAL | 0 refills | Status: DC

## 2022-01-08 NOTE — Discharge Summary (Signed)
Physician Discharge Summary  ?Jeffrey Stephens MPN:[id number] DOB: [birth year] DOA: 01/01/2022 ? ?PCP: Jeffrey Orn, MD ? ?Admit date: 01/01/2022 ?Discharge date: 01/08/2022 ? ?Admitted From: Home ?Disposition: Home with home health ? ?Recommendations for Outpatient Follow-up:  ?Follow up with PCP in 1-2 weeks ?Please obtain BMP/CBC in one week ?Orthopedics will schedule follow-up. ?Call and schedule follow-up with cardiology. ? ?Home Health: PT/OT ?Equipment/Devices: Walker, bedside commode, transferring board ? ?Discharge Condition: Stable ?CODE STATUS: Full code ?Diet recommendation: Regular diet ? ?Discharge summary: ?81 year old with paroxysmal a flutter on Eliquis, history of DVT, AAA, history of lung cancer status post lobectomy in 2017, orthostatic hypotension, COPD with emphysema, chronic hypoxic respiratory failure on intermittent home oxygen, depression, GERD presented to the ER with right hip pain after mechanical fall.  He was found to have right intertrochanteric proximal right femur fracture. ?Hospital course complicated with rapid a flutter.  Improved with Cardizem. ?Severe constipation and ileus improved with aggressive bowel regimen. ? ?# Closed traumatic right intertrochanteric femoral neck fracture: ?ORIF with IM nailing 4/11, Dr. Marlou Stephens.  Surgically stable. ?DVT prophylaxis, resumed Eliquis ?Pain management with oral opiates, and muscle relaxant along with laxatives.  ?Toe-touch weightbearing right lower extremity. ?PT OT recommended skilled nursing facility, has adequate support system at home so decided to go home with DME supplies and home health PT OT.  Orthopedics to schedule outpatient follow-up. ?  ?# acute blood loss anemia, anticipated blood loss from long bone fracture and surgery: ?Hemoglobin dropped 11-8.4-7.4-7.2. ?Received 2 units of IV iron while in the hospital.  Hemoglobin remained stable.  Last hemoglobin 7.6.  Will benefit with recheck in about 1 to 2 weeks.   ? ?COPD with emphysema,  chronic hypoxemic respiratory failure: On 1 to 2 L of oxygen.  Stable. ?  ?Paroxysmal a flutter with RVR: Good rate with Cardizem.  Echocardiogram with normal ejection fraction.  Tolerating without hypotension. ?We will discharge on Cardizem 180 mg daily. ?  ?History of DVT: Eliquis as above. ?  ?AKI on CKD stage IIIa: Normalized. ?  ?Ileus with constipation: Multifactorial.   ?Needed Fleet enema and high dose of lactulose with improvement today.  Go home with scheduled stool softener, lactulose to titrate dose to ensure 1-2 bowel movement a day. ? ?Medically stable.  He will be going home today after all arrangements are made for DME's at home. ?  ? ? ?Discharge Diagnoses:  ?Principal Problem: ?  Hip fracture ([place]) ?Active Problems: ?  Paroxysmal atrial flutter ([place]) ?  COPD with emphysema ([place]) ?  Leukocytosis ?  Normocytic anemia ?  History of DVT (deep vein thrombosis) ? ? ? ?Discharge Instructions ? ?Discharge Instructions   ? ? Call MD for:  redness, tenderness, or signs of infection (pain, swelling, redness, odor or green/yellow discharge around incision site)   Complete by: As directed ?  ? Call MD for:  severe uncontrolled pain   Complete by: As directed ?  ? Diet - low sodium heart healthy   Complete by: As directed ?  ? Increase activity slowly   Complete by: As directed ?  ? Leave dressing on - Keep it clean, dry, and intact until clinic visit   Complete by: As directed ?  ? ?  ? ?Allergies as of 01/08/2022   ?No Known Allergies ?  ? ?  ?Medication List  ?  ? ?STOP taking these medications   ? ?azithromycin 250 MG tablet ?Commonly known as: ZITHROMAX ?  ?chlorpheniramine-HYDROcodone 10-8 MG/5ML [place] ?Commonly known  as: [place] ?  ? ?  ? ?TAKE these medications   ? ?acetaminophen 500 MG tablet ?Commonly known as: TYLENOL ?Take 1,000 mg by mouth every 6 (six) hours as needed for moderate pain. ?What changed: Another medication with the same name was removed. Continue taking this medication, and follow the  directions you see here. ?  ?albuterol 108 (90 Base) MCG/ACT inhaler ?Commonly known as: VENTOLIN HFA ?TAKE 2 PUFFS BY MOUTH EVERY 6 HOURS AS NEEDED FOR WHEEZE OR SHORTNESS OF BREATH ?What changed:  ?how much to take ?how to take this ?when to take this ?reasons to take this ?additional instructions ?  ?Centrum Silver Adult 50+ Tabs ?Take 50 mg by mouth daily. ?  ?diltiazem 180 MG 24 hr capsule ?Commonly known as: CARDIZEM CD ?Take 1 capsule (180 mg total) by mouth daily. ?Start taking on: January 09, 2022 ?  ?Eliquis 5 MG Tabs tablet ?Generic drug: apixaban ?TAKE 1 TABLET BY MOUTH TWICE A DAY ?What changed: how much to take ?  ?FLONASE ALLERGY RELIEF NA ?Place 1 spray into the nose daily as needed (allergy relief). ?  ?folic acid 1 MG tablet ?Commonly known as: FOLVITE ?Take 1 mg by mouth every morning. ?  ?lactulose 10 GM/15ML solution ?Commonly known as: [place] ?Take 30 mLs (20 g total) by mouth 2 (two) times daily. ?  ?lansoprazole 30 MG capsule ?Commonly known as: PREVACID ?Take 30 mg by mouth every morning. ?  ?methocarbamol 500 MG tablet ?Commonly known as: ROBAXIN ?Take 1 tablet (500 mg total) by mouth every 6 (six) hours as needed for muscle spasms. ?  ?methotrexate 5 MG tablet ?Commonly known as: RHEUMATREX ?Take 5 mg by mouth every Monday. Caution: Chemotherapy. Protect from light. ?  ?oxyCODONE 5 MG immediate release tablet ?Commonly known as: Oxy IR/ROXICODONE ?Take 1 tablet (5 mg total) by mouth every 4 (four) hours as needed for severe pain or moderate pain. ?  ?senna-docusate 8.6-50 MG tablet ?Commonly known as: Senokot-S ?Take 1 tablet by mouth 2 (two) times daily. ?  ?Tiotropium Bromide-Olodaterol 2.5-2.5 MCG/ACT Aers ?Inhale 2 puffs into the lungs daily. ?  ?VITAMIN D3 PO ?Take 2,000 mcg by mouth in the morning and at bedtime. ?  ? ?  ? ?  ?  ? ? ?  ?Durable Medical Equipment  ?(From admission, onward)  ?  ? ? ?  ? ?  Start     Ordered  ? 01/06/22 1055  For home use only DME Other see comment   Once       ?Comments: Sliding board  ?Question:  Length of Need  Answer:  Lifetime  ? 01/06/22 1055  ? 01/06/22 1053  For home use only DME 3 n 1  Once       ?Comments: Drop arm bedside commode  ? 01/06/22 1052  ? 01/05/22 1646  For home use only DME high strength lightweight manual wheelchair with seat cushion  Once       ?Comments: Patient suffers from Right hip intertrochanteric/subtrochanteric fracture fixation with reduction and intramedullary nailing which impairs their ability to perform daily activities like bathing in the home.  A walker will not resolve  ?issue with performing activities of daily living. A wheelchair will allow patient to safely perform daily activities.Length of need 6 months . (THEN ONE OF THESE TWO:) Patient self-propels the wheelchair while engaging in frequent activities such as laundry which cannot be performed in a standard or lightweight wheelchair due to the weight of the chair.  Accessories: elevating leg rests (ELRs), wheel locks, extensions and anti-tippers.  ? 01/05/22 1647  ? 01/05/22 1645  For home use only DME Walker rolling  Once       ?Question Answer Comment  ?Walker: With 5 Inch Wheels   ?Patient needs a walker to treat with the following condition Fx   ?  ? 01/05/22 1647  ? ?  ?  ? ?  ? ? ?  ?Discharge Care Instructions  ?(From admission, onward)  ?  ? ? ?  ? ?  Start     Ordered  ? 01/08/22 0000  Leave dressing on - Keep it clean, dry, and intact until clinic visit       ? 01/08/22 0904  ? ?  ?  ? ?  ? ? Follow-up Information   ? ? Care, [facility] Follow up.   ?Specialty: Home Health Services ?Why: home health services will be provided by [facility], start of care within 48 hours post discharge ?Contact information: ?[address] ?[address] ?[place] [address] ?[phone] ? ? ?  ?  ? ?  ?  ? ?  ? ?No Known Allergies ? ?Consultations: ?Orthopedics ? ? ?Procedures/Studies: ?DG Chest 1 View ? ?Result Date: 01/02/2022 ?CLINICAL DATA:  Fall injury with  right hip fracture, preoperative chest evaluation, right knee pain. EXAM: DG HIP (WITH OR WITHOUT PELVIS) 2-3V RIGHT; RIGHT KNEE - COMPLETE 4+ VIEW; CHEST 1 VIEW COMPARISON:  PA and lateral chest 09/13

## 2022-01-11 ENCOUNTER — Encounter (HOSPITAL_COMMUNITY): Payer: Self-pay | Admitting: Orthopedic Surgery

## 2022-01-19 DIAGNOSIS — J449 Chronic obstructive pulmonary disease, unspecified: Secondary | ICD-10-CM | POA: Diagnosis not present

## 2022-01-20 ENCOUNTER — Emergency Department (HOSPITAL_COMMUNITY): Payer: Medicare PPO

## 2022-01-20 ENCOUNTER — Inpatient Hospital Stay (HOSPITAL_COMMUNITY): Payer: Medicare PPO

## 2022-01-20 ENCOUNTER — Inpatient Hospital Stay (HOSPITAL_COMMUNITY)
Admission: EM | Admit: 2022-01-20 | Discharge: 2022-01-27 | DRG: 208 | Disposition: A | Discharge status: Home Health | Payer: Medicare PPO | Attending: Internal Medicine | Admitting: Internal Medicine

## 2022-01-20 ENCOUNTER — Encounter: Payer: Medicare PPO | Admitting: Surgical

## 2022-01-20 DIAGNOSIS — J9622 Acute and chronic respiratory failure with hypercapnia: Secondary | ICD-10-CM | POA: Diagnosis present

## 2022-01-20 DIAGNOSIS — K219 Gastro-esophageal reflux disease without esophagitis: Secondary | ICD-10-CM | POA: Diagnosis present

## 2022-01-20 DIAGNOSIS — E669 Obesity, unspecified: Secondary | ICD-10-CM | POA: Diagnosis present

## 2022-01-20 DIAGNOSIS — M2550 Pain in unspecified joint: Secondary | ICD-10-CM | POA: Diagnosis not present

## 2022-01-20 DIAGNOSIS — J1289 Other viral pneumonia: Secondary | ICD-10-CM | POA: Diagnosis not present

## 2022-01-20 DIAGNOSIS — J9621 Acute and chronic respiratory failure with hypoxia: Principal | ICD-10-CM | POA: Diagnosis present

## 2022-01-20 DIAGNOSIS — D500 Iron deficiency anemia secondary to blood loss (chronic): Secondary | ICD-10-CM | POA: Diagnosis present

## 2022-01-20 DIAGNOSIS — C3430 Malignant neoplasm of lower lobe, unspecified bronchus or lung: Secondary | ICD-10-CM | POA: Diagnosis not present

## 2022-01-20 DIAGNOSIS — Z96652 Presence of left artificial knee joint: Secondary | ICD-10-CM | POA: Diagnosis present

## 2022-01-20 DIAGNOSIS — I4891 Unspecified atrial fibrillation: Secondary | ICD-10-CM

## 2022-01-20 DIAGNOSIS — D649 Anemia, unspecified: Secondary | ICD-10-CM | POA: Diagnosis present

## 2022-01-20 DIAGNOSIS — G9341 Metabolic encephalopathy: Secondary | ICD-10-CM | POA: Diagnosis present

## 2022-01-20 DIAGNOSIS — B9789 Other viral agents as the cause of diseases classified elsewhere: Secondary | ICD-10-CM | POA: Diagnosis present

## 2022-01-20 DIAGNOSIS — N400 Enlarged prostate without lower urinary tract symptoms: Secondary | ICD-10-CM | POA: Diagnosis present

## 2022-01-20 DIAGNOSIS — J9602 Acute respiratory failure with hypercapnia: Secondary | ICD-10-CM

## 2022-01-20 DIAGNOSIS — E871 Hypo-osmolality and hyponatremia: Secondary | ICD-10-CM | POA: Diagnosis present

## 2022-01-20 DIAGNOSIS — R739 Hyperglycemia, unspecified: Secondary | ICD-10-CM | POA: Diagnosis present

## 2022-01-20 DIAGNOSIS — Z7901 Long term (current) use of anticoagulants: Secondary | ICD-10-CM

## 2022-01-20 DIAGNOSIS — N1831 Chronic kidney disease, stage 3a: Secondary | ICD-10-CM | POA: Diagnosis not present

## 2022-01-20 DIAGNOSIS — Z9981 Dependence on supplemental oxygen: Secondary | ICD-10-CM

## 2022-01-20 DIAGNOSIS — Z7401 Bed confinement status: Secondary | ICD-10-CM | POA: Diagnosis not present

## 2022-01-20 DIAGNOSIS — I10 Essential (primary) hypertension: Secondary | ICD-10-CM | POA: Diagnosis not present

## 2022-01-20 DIAGNOSIS — Z6834 Body mass index (BMI) 34.0-34.9, adult: Secondary | ICD-10-CM | POA: Diagnosis not present

## 2022-01-20 DIAGNOSIS — I4892 Unspecified atrial flutter: Secondary | ICD-10-CM | POA: Diagnosis not present

## 2022-01-20 DIAGNOSIS — Z87891 Personal history of nicotine dependence: Secondary | ICD-10-CM

## 2022-01-20 DIAGNOSIS — Z87442 Personal history of urinary calculi: Secondary | ICD-10-CM

## 2022-01-20 DIAGNOSIS — M069 Rheumatoid arthritis, unspecified: Secondary | ICD-10-CM | POA: Diagnosis not present

## 2022-01-20 DIAGNOSIS — Z8673 Personal history of transient ischemic attack (TIA), and cerebral infarction without residual deficits: Secondary | ICD-10-CM

## 2022-01-20 DIAGNOSIS — I714 Abdominal aortic aneurysm, without rupture, unspecified: Secondary | ICD-10-CM | POA: Diagnosis present

## 2022-01-20 DIAGNOSIS — Z79899 Other long term (current) drug therapy: Secondary | ICD-10-CM

## 2022-01-20 DIAGNOSIS — Z8249 Family history of ischemic heart disease and other diseases of the circulatory system: Secondary | ICD-10-CM

## 2022-01-20 DIAGNOSIS — J129 Viral pneumonia, unspecified: Secondary | ICD-10-CM

## 2022-01-20 DIAGNOSIS — Z86718 Personal history of other venous thrombosis and embolism: Secondary | ICD-10-CM

## 2022-01-20 DIAGNOSIS — I480 Paroxysmal atrial fibrillation: Secondary | ICD-10-CM | POA: Diagnosis present

## 2022-01-20 DIAGNOSIS — J441 Chronic obstructive pulmonary disease with (acute) exacerbation: Secondary | ICD-10-CM | POA: Diagnosis not present

## 2022-01-20 DIAGNOSIS — R5383 Other fatigue: Secondary | ICD-10-CM | POA: Diagnosis not present

## 2022-01-20 DIAGNOSIS — J810 Acute pulmonary edema: Secondary | ICD-10-CM

## 2022-01-20 DIAGNOSIS — M79604 Pain in right leg: Secondary | ICD-10-CM | POA: Diagnosis not present

## 2022-01-20 DIAGNOSIS — Z902 Acquired absence of lung [part of]: Secondary | ICD-10-CM

## 2022-01-20 DIAGNOSIS — R0602 Shortness of breath: Secondary | ICD-10-CM | POA: Diagnosis not present

## 2022-01-20 DIAGNOSIS — J9600 Acute respiratory failure, unspecified whether with hypoxia or hypercapnia: Secondary | ICD-10-CM | POA: Diagnosis present

## 2022-01-20 DIAGNOSIS — Z85118 Personal history of other malignant neoplasm of bronchus and lung: Secondary | ICD-10-CM

## 2022-01-20 DIAGNOSIS — Z85828 Personal history of other malignant neoplasm of skin: Secondary | ICD-10-CM

## 2022-01-20 DIAGNOSIS — R0902 Hypoxemia: Secondary | ICD-10-CM | POA: Diagnosis not present

## 2022-01-20 DIAGNOSIS — J189 Pneumonia, unspecified organism: Secondary | ICD-10-CM | POA: Diagnosis present

## 2022-01-20 DIAGNOSIS — J9601 Acute respiratory failure with hypoxia: Secondary | ICD-10-CM

## 2022-01-20 DIAGNOSIS — Z20822 Contact with and (suspected) exposure to covid-19: Secondary | ICD-10-CM | POA: Diagnosis present

## 2022-01-20 DIAGNOSIS — I959 Hypotension, unspecified: Secondary | ICD-10-CM | POA: Diagnosis not present

## 2022-01-20 DIAGNOSIS — R000 Tachycardia, unspecified: Secondary | ICD-10-CM | POA: Diagnosis not present

## 2022-01-20 DIAGNOSIS — I5033 Acute on chronic diastolic (congestive) heart failure: Secondary | ICD-10-CM | POA: Diagnosis not present

## 2022-01-20 DIAGNOSIS — Z833 Family history of diabetes mellitus: Secondary | ICD-10-CM

## 2022-01-20 LAB — I-STAT ARTERIAL BLOOD GAS, ED
Acid-Base Excess: 1 mmol/L (ref 0.0–2.0)
Acid-Base Excess: 2 mmol/L (ref 0.0–2.0)
Bicarbonate: 29.5 mmol/L — ABNORMAL HIGH (ref 20.0–28.0)
Bicarbonate: 31 mmol/L — ABNORMAL HIGH (ref 20.0–28.0)
Calcium, Ion: 1.33 mmol/L (ref 1.15–1.40)
Calcium, Ion: 1.33 mmol/L (ref 1.15–1.40)
HCT: 29 % — ABNORMAL LOW (ref 39.0–52.0)
HCT: 29 % — ABNORMAL LOW (ref 39.0–52.0)
Hemoglobin: 9.9 g/dL — ABNORMAL LOW (ref 13.0–17.0)
Hemoglobin: 9.9 g/dL — ABNORMAL LOW (ref 13.0–17.0)
O2 Saturation: 42 %
O2 Saturation: 100 %
Potassium: 5.1 mmol/L (ref 3.5–5.1)
Potassium: 5.1 mmol/L (ref 3.5–5.1)
Sodium: 135 mmol/L (ref 135–145)
Sodium: 135 mmol/L (ref 135–145)
TCO2: 32 mmol/L (ref 22–32)
TCO2: 33 mmol/L — ABNORMAL HIGH (ref 22–32)
pCO2 arterial: 71.5 mmHg (ref 32–48)
pCO2 arterial: 72.9 mmHg (ref 32–48)
pH, Arterial: 7.224 — ABNORMAL LOW (ref 7.35–7.45)
pH, Arterial: 7.237 — ABNORMAL LOW (ref 7.35–7.45)
pO2, Arterial: 29 mmHg — CL (ref 83–108)
pO2, Arterial: 368 mmHg — ABNORMAL HIGH (ref 83–108)

## 2022-01-20 LAB — GLUCOSE, CAPILLARY
Glucose-Capillary: 117 mg/dL — ABNORMAL HIGH (ref 70–99)
Glucose-Capillary: 159 mg/dL — ABNORMAL HIGH (ref 70–99)
Glucose-Capillary: 165 mg/dL — ABNORMAL HIGH (ref 70–99)

## 2022-01-20 LAB — CBC WITH DIFFERENTIAL/PLATELET
Abs Immature Granulocytes: 0.03 10*3/uL (ref 0.00–0.07)
Basophils Absolute: 0 10*3/uL (ref 0.0–0.1)
Basophils Relative: 0 %
Eosinophils Absolute: 0 10*3/uL (ref 0.0–0.5)
Eosinophils Relative: 0 %
HCT: 29.5 % — ABNORMAL LOW (ref 39.0–52.0)
Hemoglobin: 8.6 g/dL — ABNORMAL LOW (ref 13.0–17.0)
Immature Granulocytes: 0 %
Lymphocytes Relative: 4 %
Lymphs Abs: 0.3 10*3/uL — ABNORMAL LOW (ref 0.7–4.0)
MCH: 29.3 pg (ref 26.0–34.0)
MCHC: 29.2 g/dL — ABNORMAL LOW (ref 30.0–36.0)
MCV: 100.3 fL — ABNORMAL HIGH (ref 80.0–100.0)
Monocytes Absolute: 0.7 10*3/uL (ref 0.1–1.0)
Monocytes Relative: 9 %
Neutro Abs: 6.9 10*3/uL (ref 1.7–7.7)
Neutrophils Relative %: 87 %
Platelets: 499 10*3/uL — ABNORMAL HIGH (ref 150–400)
RBC: 2.94 MIL/uL — ABNORMAL LOW (ref 4.22–5.81)
RDW: 19.9 % — ABNORMAL HIGH (ref 11.5–15.5)
WBC: 7.9 10*3/uL (ref 4.0–10.5)
nRBC: 0 % (ref 0.0–0.2)

## 2022-01-20 LAB — TROPONIN I (HIGH SENSITIVITY)
Troponin I (High Sensitivity): 10 ng/L (ref <18)
Troponin I (High Sensitivity): 12 ng/L (ref <18)

## 2022-01-20 LAB — COMPREHENSIVE METABOLIC PANEL
ALT: 21 U/L (ref 0–44)
AST: 34 U/L (ref 15–41)
Albumin: 2.8 g/dL — ABNORMAL LOW (ref 3.5–5.0)
Alkaline Phosphatase: 73 U/L (ref 38–126)
Anion gap: 9 (ref 5–15)
BUN: 33 mg/dL — ABNORMAL HIGH (ref 8–23)
CO2: 25 mmol/L (ref 22–32)
Calcium: 9.2 mg/dL (ref 8.9–10.3)
Chloride: 99 mmol/L (ref 98–111)
Creatinine, Ser: 1.26 mg/dL — ABNORMAL HIGH (ref 0.61–1.24)
GFR, Estimated: 58 mL/min — ABNORMAL LOW (ref >60)
Glucose, Bld: 126 mg/dL — ABNORMAL HIGH (ref 70–99)
Potassium: 5.2 mmol/L — ABNORMAL HIGH (ref 3.5–5.1)
Sodium: 133 mmol/L — ABNORMAL LOW (ref 135–145)
Total Bilirubin: 1.8 mg/dL — ABNORMAL HIGH (ref 0.3–1.2)
Total Protein: 6.4 g/dL — ABNORMAL LOW (ref 6.5–8.1)

## 2022-01-20 LAB — URINALYSIS, ROUTINE W REFLEX MICROSCOPIC
Bilirubin Urine: NEGATIVE
Glucose, UA: NEGATIVE mg/dL
Ketones, ur: NEGATIVE mg/dL
Leukocytes,Ua: NEGATIVE
Nitrite: NEGATIVE
Protein, ur: 30 mg/dL — AB
RBC / HPF: >50 RBC/hpf — ABNORMAL HIGH (ref 0–5)
Specific Gravity, Urine: 1.021 (ref 1.005–1.030)
pH: 5 (ref 5.0–8.0)

## 2022-01-20 LAB — ECHOCARDIOGRAM LIMITED
AR max vel: 2.42 cm2
AV Peak grad: 7.5 mmHg
Ao pk vel: 1.37 m/s
Area-P 1/2: 4.83 cm2
Calc EF: 61 %
Height: 69 in
S' Lateral: 3.4 cm
Single Plane A2C EF: 60.7 %
Single Plane A4C EF: 62.7 %
Weight: 3680 oz

## 2022-01-20 LAB — STREP PNEUMONIAE URINARY ANTIGEN: Strep Pneumo Urinary Antigen: NEGATIVE

## 2022-01-20 LAB — RESPIRATORY PANEL BY PCR

## 2022-01-20 LAB — PHOSPHORUS: Phosphorus: 3.8 mg/dL (ref 2.5–4.6)

## 2022-01-20 LAB — MAGNESIUM
Magnesium: 2.2 mg/dL (ref 1.7–2.4)
Magnesium: 2.2 mg/dL (ref 1.7–2.4)

## 2022-01-20 LAB — RESP PANEL BY RT-PCR (FLU A&B, COVID) ARPGX2
Influenza A by PCR: NEGATIVE
Influenza B by PCR: NEGATIVE
SARS Coronavirus 2 by RT PCR: NEGATIVE

## 2022-01-20 LAB — LACTIC ACID, PLASMA
Lactic Acid, Venous: 1.2 mmol/L (ref 0.5–1.9)
Lactic Acid, Venous: 1.2 mmol/L (ref 0.5–1.9)

## 2022-01-20 LAB — BRAIN NATRIURETIC PEPTIDE: B Natriuretic Peptide: 143.2 pg/mL — ABNORMAL HIGH (ref 0.0–100.0)

## 2022-01-20 LAB — MRSA NEXT GEN BY PCR, NASAL: MRSA by PCR Next Gen: NOT DETECTED

## 2022-01-20 MED ORDER — APIXABAN 5 MG PO TABS
5.0000 mg | ORAL_TABLET | Freq: Two times a day (BID) | ORAL | Status: DC
  Administered 2022-01-20 – 2022-01-21 (×3): 5 mg via JEJUNOSTOMY
  Filled 2022-01-20 (×4): qty 1

## 2022-01-20 MED ORDER — ORAL CARE MOUTH RINSE
15.0000 mL | OROMUCOSAL | Status: DC
  Administered 2022-01-21 (×8): 15 mL via OROMUCOSAL

## 2022-01-20 MED ORDER — SUCCINYLCHOLINE CHLORIDE 200 MG/10ML IV SOSY
PREFILLED_SYRINGE | INTRAVENOUS | Status: AC
  Filled 2022-01-20: qty 10

## 2022-01-20 MED ORDER — FENTANYL BOLUS VIA INFUSION
25.0000 ug | INTRAVENOUS | Status: DC | PRN
  Administered 2022-01-20: 50 ug via INTRAVENOUS
  Administered 2022-01-20: 25 ug via INTRAVENOUS
  Administered 2022-01-20: 50 ug via INTRAVENOUS
  Administered 2022-01-20: 25 ug via INTRAVENOUS
  Administered 2022-01-21 (×2): 50 ug via INTRAVENOUS
  Filled 2022-01-20: qty 100

## 2022-01-20 MED ORDER — DILTIAZEM HCL 60 MG PO TABS
30.0000 mg | ORAL_TABLET | Freq: Four times a day (QID) | ORAL | Status: DC
  Administered 2022-01-20 – 2022-01-21 (×6): 30 mg
  Filled 2022-01-20 (×9): qty 1

## 2022-01-20 MED ORDER — ROCURONIUM BROMIDE 50 MG/5ML IV SOLN
INTRAVENOUS | Status: DC | PRN
  Administered 2022-01-20: 100 mg via INTRAVENOUS

## 2022-01-20 MED ORDER — VANCOMYCIN HCL 1250 MG/250ML IV SOLN
1250.0000 mg | INTRAVENOUS | Status: DC
  Administered 2022-01-21: 1250 mg via INTRAVENOUS
  Filled 2022-01-20: qty 250

## 2022-01-20 MED ORDER — HYDROCORTISONE SOD SUC (PF) 100 MG IJ SOLR
100.0000 mg | Freq: Two times a day (BID) | INTRAMUSCULAR | Status: DC
  Administered 2022-01-20 – 2022-01-22 (×5): 100 mg via INTRAVENOUS
  Filled 2022-01-20 (×5): qty 2

## 2022-01-20 MED ORDER — CHLORHEXIDINE GLUCONATE CLOTH 2 % EX PADS
6.0000 | MEDICATED_PAD | Freq: Every day | CUTANEOUS | Status: DC
  Administered 2022-01-21 – 2022-01-27 (×6): 6 via TOPICAL

## 2022-01-20 MED ORDER — PROPOFOL 1000 MG/100ML IV EMUL
INTRAVENOUS | Status: AC
  Administered 2022-01-20: 5 ug/kg/min via INTRAVENOUS
  Filled 2022-01-20: qty 100

## 2022-01-20 MED ORDER — ROCURONIUM BROMIDE 10 MG/ML (PF) SYRINGE
PREFILLED_SYRINGE | INTRAVENOUS | Status: AC
  Filled 2022-01-20: qty 10

## 2022-01-20 MED ORDER — VITAL 1.5 CAL PO LIQD
1000.0000 mL | ORAL | Status: DC
  Administered 2022-01-20 – 2022-01-21 (×2): 1000 mL

## 2022-01-20 MED ORDER — SODIUM CHLORIDE 0.9 % IV SOLN
3.0000 g | Freq: Four times a day (QID) | INTRAVENOUS | Status: DC
  Administered 2022-01-20 – 2022-01-21 (×3): 3 g via INTRAVENOUS
  Filled 2022-01-20 (×5): qty 8

## 2022-01-20 MED ORDER — POLYETHYLENE GLYCOL 3350 17 G PO PACK: 17.0000 g | PACK | Freq: Every day | ORAL | Status: DC | PRN

## 2022-01-20 MED ORDER — DOCUSATE SODIUM 50 MG/5ML PO LIQD
100.0000 mg | Freq: Two times a day (BID) | ORAL | Status: DC
  Administered 2022-01-20 – 2022-01-21 (×3): 100 mg
  Filled 2022-01-20 (×3): qty 10

## 2022-01-20 MED ORDER — ETOMIDATE 2 MG/ML IV SOLN
INTRAVENOUS | Status: AC
  Filled 2022-01-20: qty 20

## 2022-01-20 MED ORDER — PROSOURCE TF PO LIQD
45.0000 mL | Freq: Three times a day (TID) | ORAL | Status: DC
  Administered 2022-01-20 – 2022-01-21 (×3): 45 mL
  Filled 2022-01-20 (×3): qty 45

## 2022-01-20 MED ORDER — UMECLIDINIUM-VILANTEROL 62.5-25 MCG/ACT IN AEPB
1.0000 | INHALATION_SPRAY | Freq: Every day | RESPIRATORY_TRACT | Status: DC
  Filled 2022-01-20: qty 14

## 2022-01-20 MED ORDER — PANTOPRAZOLE 2 MG/ML SUSPENSION: 40.0000 mg | Freq: Every day | ORAL | Status: DC

## 2022-01-20 MED ORDER — ETOMIDATE 2 MG/ML IV SOLN
INTRAVENOUS | Status: DC | PRN
  Administered 2022-01-20: 20 mg via INTRAVENOUS

## 2022-01-20 MED ORDER — ROCURONIUM BROMIDE 50 MG/5ML IV SOLN
INTRAVENOUS | Status: DC | PRN
  Administered 2022-01-20: 50 mg via INTRAVENOUS

## 2022-01-20 MED ORDER — PIPERACILLIN-TAZOBACTAM 3.375 G IVPB 30 MIN
3.3750 g | Freq: Once | INTRAVENOUS | Status: AC
  Administered 2022-01-20: 3.375 g via INTRAVENOUS
  Filled 2022-01-20: qty 50

## 2022-01-20 MED ORDER — ONDANSETRON HCL 4 MG/2ML IJ SOLN: 4.0000 mg | Freq: Four times a day (QID) | INTRAMUSCULAR | Status: DC | PRN

## 2022-01-20 MED ORDER — FUROSEMIDE 10 MG/ML IJ SOLN
60.0000 mg | Freq: Once | INTRAMUSCULAR | Status: AC
  Administered 2022-01-20: 60 mg via INTRAVENOUS
  Filled 2022-01-20: qty 6

## 2022-01-20 MED ORDER — DOCUSATE SODIUM 100 MG PO CAPS: 100.0000 mg | ORAL_CAPSULE | Freq: Two times a day (BID) | ORAL | Status: DC | PRN

## 2022-01-20 MED ORDER — PANTOPRAZOLE 2 MG/ML SUSPENSION
40.0000 mg | Freq: Every day | ORAL | Status: DC
  Administered 2022-01-20 – 2022-01-21 (×2): 40 mg
  Filled 2022-01-20 (×4): qty 20

## 2022-01-20 MED ORDER — PROPOFOL 1000 MG/100ML IV EMUL
5.0000 ug/kg/min | INTRAVENOUS | Status: DC
  Administered 2022-01-21: 4.954 ug/kg/min via INTRAVENOUS
  Filled 2022-01-20: qty 100

## 2022-01-20 MED ORDER — FENTANYL CITRATE (PF) 100 MCG/2ML IJ SOLN
25.0000 ug | Freq: Once | INTRAMUSCULAR | Status: AC
  Administered 2022-01-20: 25 ug via INTRAVENOUS

## 2022-01-20 MED ORDER — VANCOMYCIN HCL 1750 MG/350ML IV SOLN
1750.0000 mg | Freq: Once | INTRAVENOUS | Status: AC
  Administered 2022-01-20: 1750 mg via INTRAVENOUS
  Filled 2022-01-20: qty 350

## 2022-01-20 MED ORDER — FENTANYL CITRATE PF 50 MCG/ML IJ SOSY
25.0000 ug | PREFILLED_SYRINGE | Freq: Once | INTRAMUSCULAR | Status: DC
  Filled 2022-01-20: qty 1

## 2022-01-20 MED ORDER — POLYETHYLENE GLYCOL 3350 17 G PO PACK
17.0000 g | PACK | Freq: Every day | ORAL | Status: DC
  Administered 2022-01-20 – 2022-01-21 (×2): 17 g
  Filled 2022-01-20 (×2): qty 1

## 2022-01-20 MED ORDER — FENTANYL 2500MCG IN NS 250ML (10MCG/ML) PREMIX INFUSION
25.0000 ug/h | INTRAVENOUS | Status: DC
  Administered 2022-01-20: 25 ug/h via INTRAVENOUS
  Administered 2022-01-21: 120 ug/h via INTRAVENOUS
  Filled 2022-01-20 (×2): qty 250

## 2022-01-20 MED ORDER — CHLORHEXIDINE GLUCONATE 0.12% ORAL RINSE (MEDLINE KIT)
15.0000 mL | Freq: Two times a day (BID) | OROMUCOSAL | Status: DC
  Administered 2022-01-20 – 2022-01-21 (×2): 15 mL via OROMUCOSAL

## 2022-01-20 MED ORDER — ALBUTEROL SULFATE (2.5 MG/3ML) 0.083% IN NEBU
2.5000 mg | INHALATION_SOLUTION | RESPIRATORY_TRACT | Status: DC | PRN
Start: 2022-01-20 — End: 2022-01-28

## 2022-01-20 NOTE — H&P (Signed)
? ?NAME:  Jeffrey Stephens, MRN:  [id number], DOB:  [birth year], LOS: 0 ?ADMISSION DATE:  01/20/2022, CONSULTATION DATE:  01/20/22 ?REFERRING MD:  [place] - EM, CHIEF COMPLAINT:  hypoxic respiratory failure   ? ?History of Present Illness:  ?81 yo M PMH Aflutter on eliquis, prior dvt, AAA, hx lung cancer, COPD chronic hypoxic respiratory failure on home O2 and recent ortho surgery -- R IM nail for intertrochantic fx 4/11 with unremarkable post op recovery course at home -- presented to ED 4/28 with SOB. Found to be hypoxic spo2 68 requiring NRB, and in afib RVR.  ?He required emergent intubated in ED for hypoxic respiratory failure, and pt underwent synch cardioversion @ 200J for afib rvr with resultant ST.  ? ?Imaging reveals multifocal PNA. Started on abx -- vanc, zosyn. PCCM consulted for admission  ? ?Pertinent  Medical History  ?Aflutter ?DVT ?Chronic eliquis ?AAA ?Hip fx s/p IM nail ?Chronic hypoxia ?COPD  ?Lung ca  ? ?Significant Hospital Events: ?Including procedures, antibiotic start and stop dates in addition to other pertinent events   ?4/28 ED for SOB, hypoxia. Intubated. Sync CV for Afib RVR. Abc for PNA. Admit to ICU  ? ?Interim History / Subjective:  ? ?Continues to require fq suctioning of thick secretions  ? ?Objective   ?Blood pressure (!) 148/66, pulse (!) 113, temperature (!) 95.8 ?F (35.4 ?C), temperature source Rectal, resp. rate 15, height 5\' 9"  (1.753 m), weight 104.3 kg, SpO2 92 %. ?   ?Vent Mode: PRVC ?FiO2 (%):  [40 %-60 %] 60 % ?Set Rate:  [15 bmp] 15 bmp ?Vt Set:  [560 mL] 560 mL ?PEEP:  [5 cmH20-8 cmH20] 8 cmH20 ?Plateau Pressure:  [16 cmH20] 16 cmH20  ?No intake or output data in the 24 hours ending 01/20/22 0907 ?Filed Weights  ? 01/20/22 0721  ?Weight: 104.3 kg  ? ? ?Examination: ?General: Critically ill elderlyM intubated sedate NAD ?HENT: NCAT missing teeth ETT secure  ?Lungs: + rhonchi. Mechanically ventilated. Symmetrical chest expansion  ?Cardiovascular: reg rhythm tachycardic rate.  S1s2 cap refill < 3 ?Abdomen: soft ndnt  ?Extremities: BLE 2+ edema.  ?Neuro: Sedated. Does not follow commands. 53mm pupils ?GU: defer  ? ?Resolved Hospital Problem list   ? ? ?Assessment & Plan:  ? ?Acute on chronic respiratory failure with hypoxemia and hypercarbia ?CAP  ?COPD without acute exacerbation ?Hx Lung cancer s/p lobectomy ?P ?-Cont MV support, -- incr rate from 15 to 20 with pco2 70s ?-unasyn ?-follow up COVID, Flu A/B, RVP, tracheal aspirate, legionella, strep pneumo  ?-lasix  ?-will also add 100mg  BID hydrocortisone  ?-albuterol, anoro ellipta (home stiolto not on formulary) ?-PRN Abg, CXR  ? ?Afib RVR - improved ?Hx Aflutter ?Hx DVT ?-sync CV in ED 4/28 - now ST ?P ?-optimize lytes -- check a mag  ?-cardiac monitoring ?-cont eliquis  ?-[place] dilt  ? ?Hyperglycemia ?P ?-SSI ? ?AAA ?-outpt follow up  ? ?Best Practice (right click and "Reselect all SmartList Selections" daily)  ? ?Diet/type: tubefeeds ?DVT prophylaxis: DOAC ?GI prophylaxis: PPI ?Lines: N/A ?Foley:  N/A ?Code Status:  full code ?Last date of multidisciplinary goals of care discussion [pending] ? ?Labs   ?CBC: ?Recent Labs  ?Lab 01/20/22 ?4132 01/20/22 ?4401  ?WBC 7.9  --   ?NEUTROABS 6.9  --   ?HGB 8.6* 9.9*  ?HCT 29.5* 29.0*  ?MCV 100.3*  --   ?PLT 499*  --   ? ? ?Basic Metabolic Panel: ?Recent Labs  ?Lab 01/20/22 ?0272 01/20/22 ?5366  ?  NA 133* 135  ?K 5.2* 5.1  ?CL 99  --   ?CO2 25  --   ?GLUCOSE 126*  --   ?BUN 33*  --   ?CREATININE 1.26*  --   ?CALCIUM 9.2  --   ? ?GFR: ?Estimated Creatinine Clearance: 56.5 mL/min (A) (by C-G formula based on SCr of 1.26 mg/dL (H)). ?Recent Labs  ?Lab 01/20/22 ?0659 01/20/22 ?0815  ?WBC 7.9  --   ?LATICACIDVEN  --  1.2  ? ? ?Liver Function Tests: ?Recent Labs  ?Lab 01/20/22 ?7341  ?AST 34  ?ALT 21  ?ALKPHOS 73  ?BILITOT 1.8*  ?PROT 6.4*  ?ALBUMIN 2.8*  ? ?No results for input(s): LIPASE, AMYLASE in the last 168 hours. ?No results for input(s): AMMONIA in the last 168 hours. ? ?ABG ?   ?Component Value  Date/Time  ? PHART 7.224 (L) 01/20/2022 9379  ? PCO2ART 71.5 (HH) 01/20/2022 0240  ? PO2ART 29 (LL) 01/20/2022 0824  ? HCO3 29.5 (H) 01/20/2022 0824  ? TCO2 32 01/20/2022 0824  ? ACIDBASEDEF 2.3 (H) 05/09/2016 0520  ? O2SAT 42 01/20/2022 0824  ?  ? ?Coagulation Profile: ?No results for input(s): INR, PROTIME in the last 168 hours. ? ?Cardiac Enzymes: ?No results for input(s): CKTOTAL, CKMB, CKMBINDEX, TROPONINI in the last 168 hours. ? ?HbA1C: ?Hgb A1c MFr Bld  ?Date/Time Value Ref Range Status  ?06/11/2014 03:17 PM 5.3 <5.7 % Final  ?  Comment:  ?  (NOTE) ?                                                                      ?According to the ADA Clinical Practice Recommendations for 2011, when ?HbA1c is used as a screening test: ? >=6.5%   Diagnostic of Diabetes Mellitus ?          (if abnormal result is confirmed) ?5.7-6.4%   Increased risk of developing Diabetes Mellitus ?References:Diagnosis and Classification of Diabetes Mellitus,Diabetes ?XBDZ,3299,24(QASTM 1):S62-S69 and Standards of Medical Care in         ?Diabetes - 2011,Diabetes Care,2011,34 (Suppl 1):S11-S61.  ? ? ?CBG: ?No results for input(s): GLUCAP in the last 168 hours. ? ?Review of Systems:   ?Unable to obtain intubated sedated  ?Past Medical History:  ?He,  has a past medical history of AAA (abdominal aortic aneurysm) ([place]), Abnormal nuclear cardiac imaging test (07/13/2014), Basal cell carcinoma, BPH (benign prostatic hyperplasia), Cerebral aneurysm (1994), Constipation, Depression, DVT (deep venous thrombosis) ([place]) (2001), Dysrhythmia, Frequency of urination, GERD (gastroesophageal reflux disease), History of echocardiogram, History of palpitations, Kidney stones, Lung nodule, Paroxysmal atrial flutter ([place]), Rheumatoid arthritis ([place]), Stroke ([place]) (1994), and TIA (transient ischemic attack).  ? ?Surgical History:  ? ?Past Surgical History:  ?Procedure Laterality Date  ? BASAL CELL CARCINOMA EXCISION    ? "LLE; top of my head"  ? CEREBRAL  ANEURYSM REPAIR  1994  ? Hx of ruptured brain aneurysm Tx by Dr. Ellene Route  ? CYSTOSCOPY W/ STONE MANIPULATION  1980's X 1  ? EXCISIONAL HEMORRHOIDECTOMY  1970's  ? INTRAMEDULLARY (IM) NAIL INTERTROCHANTERIC Right 01/03/2022  ? Procedure: INTRAMEDULLARY (IM) NAIL INTERTROCHANTRIC; ASPIRATION OF RIGHT KNEE EFFUSION AND INJECTION.;  Surgeon: Meredith Pel, MD;  Location: [place];  Service: Orthopedics;  Laterality: Right;  ?  JOINT REPLACEMENT    ? LT KNEE  ? TOTAL KNEE ARTHROPLASTY Left 05/14/2014  ? Procedure: LEFT TOTAL KNEE ARTHROPLASTY;  Surgeon: Johnn Hai, MD;  Location: [facility];  Service: Orthopedics;  Laterality: Left;  ? VIDEO ASSISTED THORACOSCOPY (VATS)/WEDGE RESECTION Right 05/08/2016  ? Procedure: VIDEO ASSISTED THORACOSCOPY (VATS)/LUNG RESECTION;  Surgeon: Ivin Poot, MD;  Location: [facility];  Service: Thoracic;  Laterality: Right;  ?  ? ?Social History:  ? reports that he quit smoking about 29 years ago. His smoking use included cigarettes. He has a 92.50 pack-year smoking history. He has never used smokeless tobacco. He reports that he does not drink alcohol and does not use drugs.  ? ?Family History:  ?His family history includes Cancer in his brother; Diabetes in his father; Heart attack in his father; Heart disease in his brother and father. There is no history of Hypertension or Stroke.  ? ?Allergies ?No Known Allergies  ? ?Home Medications  ?Prior to Admission medications   ?Medication Sig Start Date End Date Taking? Authorizing Provider  ?acetaminophen (TYLENOL) 500 MG tablet Take 1,000 mg by mouth every 6 (six) hours as needed for moderate pain.    [provider]  ?albuterol (VENTOLIN HFA) 108 (90 Base) MCG/ACT inhaler TAKE 2 PUFFS BY MOUTH EVERY 6 HOURS AS NEEDED FOR WHEEZE OR SHORTNESS OF BREATH ?Patient taking differently: Inhale 2 puffs into the lungs every 6 (six) hours as needed for shortness of breath or wheezing. 09/20/21   Collene Gobble, MD  ?Cholecalciferol (VITAMIN D3  PO) Take 2,000 mcg by mouth in the morning and at bedtime.    [provider]  ?diltiazem (CARDIZEM CD) 180 MG 24 hr capsule Take 1 capsule (180 mg total) by mouth daily. 01/09/22 04/09/22  [place],

## 2022-01-20 NOTE — Progress Notes (Signed)
Initial Nutrition Assessment ? ?DOCUMENTATION CODES:  ? ?Not applicable ? ?INTERVENTION:  ? ?Initiate tube feeds via OG tube: ?- Vital 1.5 @ 50 ml/hr (1200 ml/day) ?- ProSource  TF 45 ml TID ? ?Tube feeding regimen provides 1920 kcal, 114 grams of protein, and 917 ml of H2O.  ? ?NUTRITION DIAGNOSIS:  ? ?Inadequate oral intake related to inability to eat as evidenced by NPO status. ? ?GOAL:  ? ?Patient will meet greater than or equal to 90% of their needs ? ?MONITOR:  ? ?Vent status, Labs, TF tolerance, Weight trends ? ?REASON FOR ASSESSMENT:  ? ?Ventilator, Consult ?Enteral/tube feeding initiation and management ? ?ASSESSMENT:  ? ?81 year old male who presented to the ED on 4/28 with SOB and Afib RVR. Pt required intubation in the ED. PMH of recent hip fx s/p surgery, atrial flutter, prior DVT, AAA, lung cancer, COPD. Pt admitted with acute on chronic respiratory failure, CAP. ? ?Discussed pt with RN and during ICU rounds. ? ?Consult received for enteral nutrition initiation and management. Pt with OG tube in place. ? ?Unable to obtain diet and weight history at this time. Reviewed weight history in chart. Pt's weight has been fairly stable over the last 2 years with fluctuations between 101-104 kg. No real trends noted. ? ?Patient is currently intubated on ventilator support ?MV: 11.5 L/min ?Temp (24hrs), Avg:97.3 ?F (36.3 ?C), Min:95.8 ?F (35.4 ?C), Max:98.7 ?F (37.1 ?C) ? ?Drips: ?Propofol: 3.1 ml/hr (provides 82 kcal daily from lipid) ?Fentanyl ? ?Medications reviewed and include: colace, IV solu-cortef, protonix, miralax, IV abx ? ?Labs reviewed: BUN 33, creatinine 1.26, hemoglobin 9.9 ? ?NUTRITION - FOCUSED PHYSICAL EXAM: ? ?Flowsheet Row Most Recent Value  ?Orbital Region Severe depletion  ?Upper Arm Region No depletion  ?Thoracic and Lumbar Region No depletion  ?Buccal Region Unable to assess  ?Temple Region Mild depletion  ?Clavicle Bone Region Mild depletion  ?Clavicle and Acromion Bone Region Mild  depletion  ?Scapular Bone Region No depletion  ?Dorsal Hand No depletion  ?Patellar Region Mild depletion  ?Anterior Thigh Region Mild depletion  ?Posterior Calf Region Mild depletion  ?Edema (RD Assessment) Mild  ?Hair Reviewed  ?Eyes Reviewed  ?Mouth Reviewed  ?Skin Reviewed  ?Nails Reviewed  ? ?  ? ? ?Diet Order:   ?Diet Order   ? ?       ?  Diet NPO time specified  Diet effective now       ?  ? ?  ?  ? ?  ? ? ?EDUCATION NEEDS:  ? ?Not appropriate for education at this time ? ?Skin:  Skin Assessment: Reviewed RN Assessment ? ?Last BM:  no documented BM ? ?Height:  ? ?Ht Readings from Last 1 Encounters:  ?01/20/22 5\' 9"  (1.753 m)  ? ? ?Weight:  ? ?Wt Readings from Last 1 Encounters:  ?01/20/22 104.3 kg  ? ? ?BMI:  Body mass index is 33.97 kg/m?. ? ?Estimated Nutritional Needs:  ? ?Kcal:  1900-2100 ? ?Protein:  110-130 grams ? ?Fluid:  1.9-2.1 L ? ? ? ?Gustavus Bryant, MS, RD, LDN ?Inpatient Clinical Dietitian ?Please see AMiON for contact information. ? ?

## 2022-01-20 NOTE — Progress Notes (Addendum)
Pharmacy Antibiotic Note ? ?Jeffrey Stephens is a 81 y.o. male admitted on 01/20/2022 presenting with SOB.  Pharmacy has been consulted for vancomycin dosing.  Zosyn per MD ? ?Plan: ?Vancomycin 1750 mg IV x 1, then 1250 mg IV q 24h (eAUC 546, SCr 1.26) ?Add MRSA PCR ?Monitor renal function, Cx/PCR and clinical progression to narrow ?Vancomycin levels as needed ? ?Height: 5\' 9"  (175.3 cm) ?Weight: 104.3 kg (230 lb) ?IBW/kg (Calculated) : 70.7 ? ?Temp (24hrs), Avg:95.8 ?F (35.4 ?C), Min:95.8 ?F (35.4 ?C), Max:95.8 ?F (35.4 ?C) ? ?Recent Labs  ?Lab 01/20/22 ?9826  ?WBC 7.9  ?CREATININE 1.26*  ?  ?Estimated Creatinine Clearance: 56.5 mL/min (A) (by C-G formula based on SCr of 1.26 mg/dL (H)).   ? ?No Known Allergies ? ? ?Addendum: ?To add Unasyn 3g IV q 6h ? ?Bertis Ruddy, PharmD ?Clinical Pharmacist ?ED Pharmacist Phone # [phone] ?01/20/2022 8:19 AM ? ? ?

## 2022-01-20 NOTE — ED Notes (Signed)
Awaiting availability of respiratory for transport upstairs.  ?

## 2022-01-20 NOTE — ED Notes (Signed)
Pt noted to be twitching  more than normal.  HR up from 112 to 135.  25 mcg bolus of fentanyl given to assist with sedation/pain. ?

## 2022-01-20 NOTE — Progress Notes (Signed)
Per CCM, decrease pt's PEEP slowly until down to a PEEP of 5. ?

## 2022-01-20 NOTE — ED Notes (Signed)
Pt continues to move and has begun moving his arms after increasing dose to 28mcg/hr.  Another 32mcg dose of fentanyl has been given ?

## 2022-01-20 NOTE — Code Documentation (Addendum)
After first 20 etomidate and 150 Rocc the Pt continued to fight the intubation tube and gag. New IV was started to ensure patency and more RSI medications given per EDP at bedside. Pt sedated and paralyzed. ETT dropped at 0725. X-ray outside of the pt's door ready for Chest CT to confirm placement.   ?

## 2022-01-20 NOTE — Progress Notes (Signed)
Responded to page to support pt. Family.  Pt. Is intubated.  Provided emotional and spiritual support. Chaplain available as needed. ? ?Cristopher Peru, [facility], Pager [phone]   ?

## 2022-01-20 NOTE — ED Triage Notes (Signed)
Pt BIB from home for SOB and Afib RVR pt was on 2L [place] with EMS originally at 92% the desated to 68. Pt placed on a NRB. Pt lethargic but Oriented with EMS.   ? ?150 HR  ?158/82 ?

## 2022-01-20 NOTE — ED Notes (Signed)
Pt still increasing in activity after 2 boluses.  Family at bedside.  Fentanyl increased to 50 mcg/hr ?

## 2022-01-20 NOTE — ED Notes (Signed)
Pt

## 2022-01-20 NOTE — ED Provider Notes (Signed)
?[facility] ?Provider Note ? ? ?CSN: [id number] ?Arrival date & time: 01/20/22  4098 ? ?  ? ?History ? ?Chief Complaint  ?Patient presents with  ? Afib RVR  ? Shortness of Breath  ? ? ?Jeffrey Stephens is a 81 y.o. male. ? ?Patient presents ER chief complaint of shortness of breath.  Found to be in atrial fibrillation with rapid rate in the ambulance.  Patient had recent right lower extremity surgery approximately 2 weeks ago and has been recovering at home.  No additional reports of fevers or or vomiting or diarrhea.  Symptoms reportedly began late last night. ? ? ?  ? ?Home Medications ?Prior to Admission medications   ?Medication Sig Start Date End Date Taking? Authorizing Provider  ?acetaminophen (TYLENOL) 500 MG tablet Take 1,000 mg by mouth every 6 (six) hours as needed for moderate pain.    [provider]  ?albuterol (VENTOLIN HFA) 108 (90 Base) MCG/ACT inhaler TAKE 2 PUFFS BY MOUTH EVERY 6 HOURS AS NEEDED FOR WHEEZE OR SHORTNESS OF BREATH ?Patient taking differently: Inhale 2 puffs into the lungs every 6 (six) hours as needed for shortness of breath or wheezing. 09/20/21   Collene Gobble, MD  ?Cholecalciferol (VITAMIN D3 PO) Take 2,000 mcg by mouth in the morning and at bedtime.    [provider]  ?diltiazem (CARDIZEM CD) 180 MG 24 hr capsule Take 1 capsule (180 mg total) by mouth daily. 01/09/22 04/09/22  Barb Merino, MD  ?ELIQUIS 5 MG TABS tablet TAKE 1 TABLET BY MOUTH TWICE A DAY ?Patient taking differently: Take 5 mg by mouth 2 (two) times daily. 11/21/21   Croitoru, Mihai, MD  ?Fluticasone Propionate (FLONASE ALLERGY RELIEF NA) Place 1 spray into the nose daily as needed (allergy relief).    [provider]  ?folic acid (FOLVITE) 1 MG tablet Take 1 mg by mouth every morning.     [provider]  ?lactulose (CHRONULAC) 10 GM/15ML solution Take 30 mLs (20 g total) by mouth 2 (two) times daily. 01/08/22   Barb Merino, MD   ?lansoprazole (PREVACID) 30 MG capsule Take 30 mg by mouth every morning.    [provider]  ?methocarbamol (ROBAXIN) 500 MG tablet Take 1 tablet (500 mg total) by mouth every 6 (six) hours as needed for muscle spasms. 01/06/22   Meredith Pel, MD  ?methotrexate (RHEUMATREX) 5 MG tablet Take 5 mg by mouth every Monday. Caution: Chemotherapy. Protect from light.    [provider]  ?Multiple Vitamins-Minerals (CENTRUM SILVER ADULT 50+) TABS Take 50 mg by mouth daily.    [provider]  ?oxyCODONE (OXY IR/ROXICODONE) 5 MG immediate release tablet Take 1 tablet (5 mg total) by mouth every 4 (four) hours as needed for severe pain or moderate pain. 01/06/22   Meredith Pel, MD  ?senna-docusate (SENOKOT-S) 8.6-50 MG tablet Take 1 tablet by mouth 2 (two) times daily. 01/08/22 02/07/22  Barb Merino, MD  ?Tiotropium Bromide-Olodaterol 2.5-2.5 MCG/ACT AERS Inhale 2 puffs into the lungs daily. 03/22/21   Martyn Ehrich, NP  ?   ? ?Allergies    ?Patient has no known allergies.   ? ?Review of Systems   ?Review of Systems  ?Unable to perform ROS: Acuity of condition  ? ?Physical Exam ?Updated Vital Signs ?BP 133/71   Pulse (!) 118   Temp (!) 95.8 ?F (35.4 ?C) (Rectal)   Resp 15   Ht 5\' 9"  (1.753 m)   Wt 104.3  kg   SpO2 (!) 86%   BMI 33.97 kg/m?  ?Physical Exam ?Constitutional:   ?   General: He is not in acute distress. ?   Appearance: He is not ill-appearing.  ?   Comments: Moderately obtunded  ?HENT:  ?   Head: Normocephalic.  ?   Nose: Nose normal.  ?Eyes:  ?   Extraocular Movements: Extraocular movements intact.  ?Cardiovascular:  ?   Rate and Rhythm: Tachycardia present.  ?Pulmonary:  ?   Effort: Tachypnea and accessory muscle usage present.  ?   Breath sounds: Rhonchi and rales present.  ?Abdominal:  ?   Palpations: Abdomen is soft.  ?   Tenderness: There is no abdominal tenderness. There is no guarding or rebound.  ?Musculoskeletal:  ?   Right lower leg: Edema present.  ?    Left lower leg: Edema present.  ?Skin: ?   Coloration: Skin is not jaundiced.  ?Neurological:  ?   Mental Status: Mental status is at baseline.  ?   Comments: Patient presents with agonal breathing, clipped speech, intermittent confusion and increased somnolence.  Otherwise moving all extremities able to follow simple commands such as lift up your arms.  ? ? ?ED Results / Procedures / Treatments   ?Labs ?(all labs ordered are listed, but only abnormal results are displayed) ?Labs Reviewed  ?CBC WITH DIFFERENTIAL/PLATELET - Abnormal; Notable for the following components:  ?    Result Value  ? RBC 2.94 (*)   ? Hemoglobin 8.6 (*)   ? HCT 29.5 (*)   ? MCV 100.3 (*)   ? MCHC 29.2 (*)   ? RDW 19.9 (*)   ? Platelets 499 (*)   ? Lymphs Abs 0.3 (*)   ? All other components within normal limits  ?COMPREHENSIVE METABOLIC PANEL - Abnormal; Notable for the following components:  ? Sodium 133 (*)   ? Potassium 5.2 (*)   ? Glucose, Bld 126 (*)   ? BUN 33 (*)   ? Creatinine, Ser 1.26 (*)   ? Total Protein 6.4 (*)   ? Albumin 2.8 (*)   ? Total Bilirubin 1.8 (*)   ? GFR, Estimated 58 (*)   ? All other components within normal limits  ?BRAIN NATRIURETIC PEPTIDE - Abnormal; Notable for the following components:  ? B Natriuretic Peptide 143.2 (*)   ? All other components within normal limits  ?URINALYSIS, ROUTINE W REFLEX MICROSCOPIC - Abnormal; Notable for the following components:  ? Color, Urine AMBER (*)   ? APPearance HAZY (*)   ? Hgb urine dipstick MODERATE (*)   ? Protein, ur 30 (*)   ? RBC / HPF >50 (*)   ? Bacteria, UA FEW (*)   ? All other components within normal limits  ?CULTURE, BLOOD (ROUTINE X 2)  ?CULTURE, BLOOD (ROUTINE X 2)  ?URINE CULTURE  ?RESP PANEL BY RT-PCR (FLU A&B, COVID) [id number]  ?LACTIC ACID, PLASMA  ?LACTIC ACID, PLASMA  ?PROTIME-INR  ?APTT  ?BLOOD GAS, ARTERIAL  ?I-STAT ARTERIAL BLOOD GAS, ED  ?TROPONIN I (HIGH SENSITIVITY)  ? ? ?EKG ?EKG Interpretation ? ?Date/Time:  Friday January 20 2022 06:43:35  EDT ?Ventricular Rate:  140 ?PR Interval:  116 ?QRS Duration: 111 ?QT Interval:  345 ?QTC Calculation: 527 ?R Axis:   93 ?Text Interpretation: Atrial fibrillation with rapid ventricular response Right axis deviation Nonspecific T abnormalities, diffuse leads Prolonged QT interval Confirmed by Orpah Greek [id number]) on 01/20/2022 6:49:15 AM ? ?Radiology ?DG Chest Port 1 View ? ?  Result Date: 01/20/2022 ?CLINICAL DATA:  Shortness of breath. EXAM: PORTABLE CHEST 1 VIEW COMPARISON:  Chest XR, most recently 01/02/2022. CT AP, 12/19/2021. CT chest, 10/15/2020 FINDINGS: Support lines: Intubation with ETT tip within the midthoracic trachea, 6 cm from carina. Enteric feeding tube with tip and side port outside the field of view. Overlying cutaneous pacers and leads. Cardiomediastinal silhouette is within normal limits given technique and degree of inflation. Aortic arch atherosclerosis. Hypoinflation with bilateral perihilar radiating interstitial thickening with hazy opacity at the lung bases. No discrete consolidation. No pleural effusion or pneumothorax. Surgical clips at the RIGHT lung base. No acute displaced fracture. IMPRESSION: 1. Intubation with ETT tip well-positioned within the midthoracic trachea 2. Interstitial thickening superimposed on obstructive lung disease. Findings likely to represent pulmonary edema, though early multifocal pneumonia can appear similar. Electronically Signed   By: Michaelle Birks M.D.   On: 01/20/2022 07:54   ? ?Procedures ?Marland KitchenCritical Care ?Performed by: Luna Fuse, MD ?Authorized by: Luna Fuse, MD  ? ?Critical care provider statement:  ?  Critical care time (minutes):  40 ?  Critical care time was exclusive of:  Separately billable procedures and treating other patients and teaching time ?  Critical care was necessary to treat or prevent imminent or life-threatening deterioration of the following conditions:  Respiratory failure ?Comments:  ?    ? ? ?Procedure Name:  Intubation ?Date/Time: 01/20/2022 8:17 AM ?Performed by: Luna Fuse, MD ?Pre-anesthesia Checklist: Patient identified ?Oxygen Delivery Method: Non-rebreather mask ?Comments: Patient intubated without functional difficulty.  Good fi

## 2022-01-20 NOTE — ED Notes (Signed)
Critical care at bedside.  He has been made aware that pt has thick secretions are present and frequent suctioning is required to maintain Sats.  RT is aware as well and has been involved in helping to make that happen. ? ?

## 2022-01-20 NOTE — ED Notes (Signed)
Respiratory at bedside. Pt lethargic and not able to clear his throat well.  ?

## 2022-01-20 NOTE — Progress Notes (Signed)
Pt was transported via ventilator to 5K27 with no complications. 45M RT was given report and will continue to monitor.  ?

## 2022-01-21 DIAGNOSIS — J441 Chronic obstructive pulmonary disease with (acute) exacerbation: Secondary | ICD-10-CM

## 2022-01-21 DIAGNOSIS — J9621 Acute and chronic respiratory failure with hypoxia: Secondary | ICD-10-CM

## 2022-01-21 DIAGNOSIS — I4891 Unspecified atrial fibrillation: Secondary | ICD-10-CM

## 2022-01-21 DIAGNOSIS — J810 Acute pulmonary edema: Secondary | ICD-10-CM

## 2022-01-21 DIAGNOSIS — N1831 Chronic kidney disease, stage 3a: Secondary | ICD-10-CM

## 2022-01-21 DIAGNOSIS — J189 Pneumonia, unspecified organism: Secondary | ICD-10-CM | POA: Diagnosis present

## 2022-01-21 DIAGNOSIS — J129 Viral pneumonia, unspecified: Secondary | ICD-10-CM

## 2022-01-21 DIAGNOSIS — G9341 Metabolic encephalopathy: Secondary | ICD-10-CM | POA: Diagnosis present

## 2022-01-21 DIAGNOSIS — E871 Hypo-osmolality and hyponatremia: Secondary | ICD-10-CM

## 2022-01-21 LAB — GLUCOSE, CAPILLARY
Glucose-Capillary: 116 mg/dL — ABNORMAL HIGH (ref 70–99)
Glucose-Capillary: 122 mg/dL — ABNORMAL HIGH (ref 70–99)
Glucose-Capillary: 157 mg/dL — ABNORMAL HIGH (ref 70–99)
Glucose-Capillary: 159 mg/dL — ABNORMAL HIGH (ref 70–99)
Glucose-Capillary: 159 mg/dL — ABNORMAL HIGH (ref 70–99)
Glucose-Capillary: 175 mg/dL — ABNORMAL HIGH (ref 70–99)

## 2022-01-21 LAB — BASIC METABOLIC PANEL
Anion gap: 6 (ref 5–15)
Anion gap: 10 (ref 5–15)
BUN: 39 mg/dL — ABNORMAL HIGH (ref 8–23)
BUN: 39 mg/dL — ABNORMAL HIGH (ref 8–23)
CO2: 27 mmol/L (ref 22–32)
CO2: 26 mmol/L (ref 22–32)
Calcium: 8.8 mg/dL — ABNORMAL LOW (ref 8.9–10.3)
Calcium: 8.8 mg/dL — ABNORMAL LOW (ref 8.9–10.3)
Chloride: 104 mmol/L (ref 98–111)
Chloride: 104 mmol/L (ref 98–111)
Creatinine, Ser: 1.28 mg/dL — ABNORMAL HIGH (ref 0.61–1.24)
Creatinine, Ser: 1.12 mg/dL (ref 0.61–1.24)
GFR, Estimated: 57 mL/min — ABNORMAL LOW (ref >60)
GFR, Estimated: >60 mL/min (ref >60)
Glucose, Bld: 193 mg/dL — ABNORMAL HIGH (ref 70–99)
Glucose, Bld: 115 mg/dL — ABNORMAL HIGH (ref 70–99)
Potassium: 4.8 mmol/L (ref 3.5–5.1)
Potassium: 4.6 mmol/L (ref 3.5–5.1)
Sodium: 137 mmol/L (ref 135–145)
Sodium: 140 mmol/L (ref 135–145)

## 2022-01-21 LAB — CBC
HCT: 24.6 % — ABNORMAL LOW (ref 39.0–52.0)
Hemoglobin: 7.2 g/dL — ABNORMAL LOW (ref 13.0–17.0)
MCH: 28.2 pg (ref 26.0–34.0)
MCHC: 29.3 g/dL — ABNORMAL LOW (ref 30.0–36.0)
MCV: 96.5 fL (ref 80.0–100.0)
Platelets: 356 10*3/uL (ref 150–400)
RBC: 2.55 MIL/uL — ABNORMAL LOW (ref 4.22–5.81)
RDW: 19.9 % — ABNORMAL HIGH (ref 11.5–15.5)
WBC: 9.1 10*3/uL (ref 4.0–10.5)
nRBC: 0.2 % (ref 0.0–0.2)

## 2022-01-21 LAB — POCT I-STAT 7, (LYTES, BLD GAS, ICA,H+H)
Acid-Base Excess: 6 mmol/L — ABNORMAL HIGH (ref 0.0–2.0)
Bicarbonate: 30.2 mmol/L — ABNORMAL HIGH (ref 20.0–28.0)
Calcium, Ion: 1.26 mmol/L (ref 1.15–1.40)
HCT: 23 % — ABNORMAL LOW (ref 39.0–52.0)
Hemoglobin: 7.8 g/dL — ABNORMAL LOW (ref 13.0–17.0)
O2 Saturation: 98 %
Patient temperature: 98.6
Potassium: 4.7 mmol/L (ref 3.5–5.1)
Sodium: 137 mmol/L (ref 135–145)
TCO2: 32 mmol/L (ref 22–32)
pCO2 arterial: 42.3 mmHg (ref 32–48)
pH, Arterial: 7.462 — ABNORMAL HIGH (ref 7.35–7.45)
pO2, Arterial: 95 mmHg (ref 83–108)

## 2022-01-21 LAB — URINE CULTURE: Culture: NO GROWTH

## 2022-01-21 LAB — MAGNESIUM: Magnesium: 2.2 mg/dL (ref 1.7–2.4)

## 2022-01-21 LAB — PHOSPHORUS: Phosphorus: 2.6 mg/dL (ref 2.5–4.6)

## 2022-01-21 MED ORDER — IPRATROPIUM-ALBUTEROL 0.5-2.5 (3) MG/3ML IN SOLN: 3.0000 mL | Freq: Three times a day (TID) | RESPIRATORY_TRACT | Status: DC

## 2022-01-21 MED ORDER — ORAL CARE MOUTH RINSE
15.0000 mL | Freq: Two times a day (BID) | OROMUCOSAL | Status: DC
  Administered 2022-01-22 – 2022-01-27 (×9): 15 mL via OROMUCOSAL

## 2022-01-21 MED ORDER — DILTIAZEM HCL 30 MG PO TABS
30.0000 mg | ORAL_TABLET | Freq: Four times a day (QID) | ORAL | Status: DC
  Administered 2022-01-22 – 2022-01-27 (×24): 30 mg via ORAL
  Filled 2022-01-21 (×23): qty 1

## 2022-01-21 MED ORDER — FENTANYL CITRATE (PF) 100 MCG/2ML IJ SOLN: 25.0000 ug | INTRAMUSCULAR | Status: DC | PRN

## 2022-01-21 MED ORDER — ARFORMOTEROL TARTRATE 15 MCG/2ML IN NEBU
15.0000 ug | INHALATION_SOLUTION | Freq: Two times a day (BID) | RESPIRATORY_TRACT | Status: DC
  Administered 2022-01-21 – 2022-01-27 (×13): 15 ug via RESPIRATORY_TRACT
  Filled 2022-01-21 (×14): qty 2

## 2022-01-21 MED ORDER — LIP MEDEX EX OINT
TOPICAL_OINTMENT | CUTANEOUS | Status: DC | PRN
  Administered 2022-01-23: 1 via TOPICAL
  Filled 2022-01-21: qty 7

## 2022-01-21 MED ORDER — APIXABAN 5 MG PO TABS
5.0000 mg | ORAL_TABLET | Freq: Two times a day (BID) | ORAL | Status: DC
Start: 2022-01-21 — End: 2022-01-28
  Administered 2022-01-21 – 2022-01-27 (×13): 5 mg via ORAL
  Filled 2022-01-21 (×12): qty 1

## 2022-01-21 MED ORDER — INSULIN ASPART 100 UNIT/ML IJ SOLN
0.0000 [IU] | INTRAMUSCULAR | Status: DC
  Administered 2022-01-21 – 2022-01-22 (×6): 1 [IU] via SUBCUTANEOUS

## 2022-01-21 MED ORDER — FUROSEMIDE 10 MG/ML IJ SOLN
40.0000 mg | Freq: Four times a day (QID) | INTRAMUSCULAR | Status: AC
  Administered 2022-01-21 (×2): 40 mg via INTRAVENOUS
  Filled 2022-01-21 (×2): qty 4

## 2022-01-21 MED ORDER — BUDESONIDE 0.5 MG/2ML IN SUSP
0.5000 mg | Freq: Two times a day (BID) | RESPIRATORY_TRACT | Status: DC
  Administered 2022-01-21 – 2022-01-27 (×13): 0.5 mg via RESPIRATORY_TRACT
  Filled 2022-01-21 (×15): qty 2

## 2022-01-21 NOTE — Assessment & Plan Note (Signed)
As above.

## 2022-01-21 NOTE — Progress Notes (Signed)
? ?NAME:  Jeffrey Stephens, MRN:  [id number], DOB:  [birth year], LOS: 1 ?ADMISSION DATE:  01/20/2022, CONSULTATION DATE:  4/28 ?REFERRING MD:  [place], CHIEF COMPLAINT:  Dyspnea  ? ?History of Present Illness:  ?81 yo male with multiple medical problems presented ot the ER with dyspnea and acute hypoxemic respiratory failure due to rhinovirus pneumonia 2 weeks after having a fall and IM nail after a R hip fracture. ? ?Pertinent  Medical History  ?Aflutter ?DVT ?Chronic eliquis ?AAA ?Hip fx s/p IM nail ?Chronic hypoxia ?COPD  ?Lung cancer ? ?Significant Hospital Events: ?Including procedures, antibiotic start and stop dates in addition to other pertinent events   ?4/28 ED for SOB, hypoxia. Intubated. Sync CV for Afib RVR. Abc for PNA. Admit to ICU, started on unasyn; RSV panel positive for rhinovirus ?4/29 awake on ventilator ? ?Interim History / Subjective:  ? ?Afebrile ?Cr unchanged ?WBC normal ? ?Objective   ?Blood pressure (!) 129/51, pulse (!) 120, temperature 99.6 ?F (37.6 ?C), temperature source Axillary, resp. rate (!) 22, height 5\' 9"  (1.753 m), weight 104.3 kg, SpO2 91 %. ?   ?Vent Mode: PRVC ?FiO2 (%):  [40 %-100 %] 40 % ?Set Rate:  [22 bmp] 22 bmp ?Vt Set:  [480 mL-560 mL] 480 mL ?PEEP:  [8 cmH20-12 cmH20] 8 cmH20 ?Plateau Pressure:  [13 cmH20-25 cmH20] 18 cmH20  ? ?Intake/Output Summary (Last 24 hours) at 01/21/2022 0723 ?Last data filed at 01/21/2022 0600 ?Gross per 24 hour  ?Intake 1264.22 ml  ?Output 620 ml  ?Net 644.22 ml  ? ?Filed Weights  ? 01/20/22 0721  ?Weight: 104.3 kg  ? ? ?Examination: ? ?General:  In bed on vent ?HENT: NCAT ETT in place ?PULM: CTA B, vent supported breathing ?CV: Irreg irreg, no mgr ?GI: BS+, soft, nontender ?MSK: normal bulk and tone ?Neuro: sedated on vent but will wake up and follow commands ? ? ?Resolved Hospital Problem list   ? ? ?Assessment & Plan:  ?Acute on chronic respiratory failure with hypoxemia due to rhinovirus pneumonia ([place]) ?Full mechanical vent support ?VAP  prevention ?Daily WUA/SBT ?Attempt SBT today ?Lasix today ?Stop antibiotics ?Continue hydrocortisone for now ? ? ? ?Acute pulmonary edema (HCC) ?Lasix today ? ?Abdominal aortic aneurysm ([place]) ?F/u imaging as outpatient ? ?Acute metabolic encephalopathy ?Due to sepsis ?Improved ?Wean off sedation today for WUA ?PAD Protocol ordered: fentanyl/propofol infusion ? ?Atrial fibrillation with rapid ventricular response ([place]) ?Diltiazem ?eliquis ?tele ? ?CAP (community acquired pneumonia) ?Due to rhinovirus ?Stop antibiotics ? ?CKD stage G3a/A1, GFR 45-59 and albumin creatinine ratio <30 mg/g (HCC) ?Monitor BMET and UOP ?Replace electrolytes as needed ? ? ?COPD exacerbation ([place]) ?Add brovana/pulmicort ?Continue prn albuterol ?Hydrocortisone for now, re-assess need for steroids on 4/30 ? ?History of DVT (deep vein thrombosis) ?eliquis ? ?Hyponatremia ?Monitor  ? ?Pneumonia due to virus ?As above ? ?Lung cancer, lower lobe ([place]) ?F/u with outpatient clinic ? ?Rheumatoid arthritis ([place]) ?Treatment for which is causing him to be immunocompromised (methotrexate) ?Hold methotrexate ?  ? ?Best Practice (right click and "Reselect all SmartList Selections" daily)  ? ?Diet/type: tubefeeds ?DVT prophylaxis: DOAC ?GI prophylaxis: PPI ?Lines: N/A ?Foley:  Yes, and it is still needed ?Code Status:  full code ?Last date of multidisciplinary goals of care discussion [4/28; daughter updated bedside on 4/29] ? ?  ? ?Critical care time: 35 minutes ?  ? ? ?Acute on chronic hypoxic/hypercapnic respiratory failure ?Acute respiratory acidosis ?Acute metabolic encephalopathy due to CO2 narcosis ?Community-acquired versus aspiration pneumonia ?Acute COPD  exacerbation ?Paroxysmal A-fib with RVR s/p DCCV ?CKD stage IIIa ?Hyponatremia ?AAA ?Lung cancer s/p lobectomy ?Chronic DVT ?

## 2022-01-21 NOTE — Progress Notes (Signed)
Changed tube holder ? ?

## 2022-01-21 NOTE — Assessment & Plan Note (Addendum)
Due to rhinovirus ?Monitor off antibiotics ?

## 2022-01-21 NOTE — Assessment & Plan Note (Signed)
Monitor

## 2022-01-21 NOTE — Assessment & Plan Note (Signed)
eliquis  

## 2022-01-21 NOTE — Assessment & Plan Note (Signed)
Treatment for which is causing him to be immunocompromised (methotrexate) ?Hold methotrexate ?

## 2022-01-21 NOTE — Assessment & Plan Note (Signed)
Diltiazem ?eliquis ?tele ?

## 2022-01-21 NOTE — Assessment & Plan Note (Signed)
F/u imaging as outpatient ?

## 2022-01-21 NOTE — Procedures (Signed)
Extubation Procedure Note ? ?Patient Details:   ?Name: Jeffrey Stephens ?DOB: [birth year] ?MRN: [id number] ?  ?Airway Documentation:  ?Airway 7.5 mm (Active)  ?Secured at (cm) 26 cm 01/21/22 1040  ?Measured From Lips 01/21/22 1040  ?[place] 01/21/22 1040  ?Secured By [employer] 01/21/22 1040  ?Tube Holder Repositioned Yes 01/21/22 1040  ?Prone position No 01/21/22 1040  ?Cuff Pressure (cm H2O) Clear OR 27-39 CmH2O 01/21/22 0745  ?Site Condition Cool;Dry 01/21/22 1040  ?   ?Airway (Active)  ?Secured at (cm) 25 cm 01/20/22 2000  ?Measured From Lips 01/20/22 2000  ?Secured By [employer] 01/20/22 2000  ? ?Vent end date: (not recorded) Vent end time: (not recorded)  ? ?Evaluation ? O2 sats: stable throughout ?Complications: No apparent complications ?Patient did tolerate procedure well. ?Bilateral Breath Sounds: Rhonchi ?  ?Yes ? ?[name] ?01/21/2022, 11:51 AM ? ?

## 2022-01-21 NOTE — Assessment & Plan Note (Addendum)
Continue brovana/pulmicort ?Continue prn albuterol ?Change hydrocortisone to prednisone 20mg  daily x 5 days ?

## 2022-01-21 NOTE — Assessment & Plan Note (Addendum)
Lasix again today ?

## 2022-01-21 NOTE — Assessment & Plan Note (Addendum)
Due to sepsis ?Improved ?Frequent orientation ?Hold sedatives ?

## 2022-01-21 NOTE — Assessment & Plan Note (Signed)
F/u with outpatient clinic ?

## 2022-01-21 NOTE — Assessment & Plan Note (Signed)
Monitor BMET and UOP ?Replace electrolytes as needed ? ?

## 2022-01-21 NOTE — Assessment & Plan Note (Addendum)
Improved ?Wean off O2 for O2 sat > 88% ?Out of bed, mobilize ? ? ?

## 2022-01-22 DIAGNOSIS — M1711 Unilateral primary osteoarthritis, right knee: Secondary | ICD-10-CM

## 2022-01-22 DIAGNOSIS — J9621 Acute and chronic respiratory failure with hypoxia: Secondary | ICD-10-CM | POA: Diagnosis not present

## 2022-01-22 LAB — COMPREHENSIVE METABOLIC PANEL
ALT: 19 U/L (ref 0–44)
AST: 23 U/L (ref 15–41)
Albumin: 2 g/dL — ABNORMAL LOW (ref 3.5–5.0)
Alkaline Phosphatase: 57 U/L (ref 38–126)
Anion gap: 6 (ref 5–15)
BUN: 35 mg/dL — ABNORMAL HIGH (ref 8–23)
CO2: 30 mmol/L (ref 22–32)
Calcium: 8.4 mg/dL — ABNORMAL LOW (ref 8.9–10.3)
Chloride: 103 mmol/L (ref 98–111)
Creatinine, Ser: 1.07 mg/dL (ref 0.61–1.24)
GFR, Estimated: >60 mL/min (ref >60)
Glucose, Bld: 125 mg/dL — ABNORMAL HIGH (ref 70–99)
Potassium: 3.8 mmol/L (ref 3.5–5.1)
Sodium: 139 mmol/L (ref 135–145)
Total Bilirubin: 1.2 mg/dL (ref 0.3–1.2)
Total Protein: 5.3 g/dL — ABNORMAL LOW (ref 6.5–8.1)

## 2022-01-22 LAB — GLUCOSE, CAPILLARY
Glucose-Capillary: 142 mg/dL — ABNORMAL HIGH (ref 70–99)
Glucose-Capillary: 145 mg/dL — ABNORMAL HIGH (ref 70–99)
Glucose-Capillary: 154 mg/dL — ABNORMAL HIGH (ref 70–99)
Glucose-Capillary: 156 mg/dL — ABNORMAL HIGH (ref 70–99)
Glucose-Capillary: 127 mg/dL — ABNORMAL HIGH (ref 70–99)
Glucose-Capillary: 117 mg/dL — ABNORMAL HIGH (ref 70–99)

## 2022-01-22 MED ORDER — FUROSEMIDE 40 MG PO TABS
40.0000 mg | ORAL_TABLET | Freq: Four times a day (QID) | ORAL | Status: AC
  Administered 2022-01-22 (×2): 40 mg via ORAL
  Filled 2022-01-22 (×2): qty 1

## 2022-01-22 MED ORDER — FUROSEMIDE 10 MG/ML IJ SOLN: 40.0000 mg | Freq: Four times a day (QID) | INTRAMUSCULAR | Status: DC

## 2022-01-22 MED ORDER — PREDNISONE 20 MG PO TABS
20.0000 mg | ORAL_TABLET | Freq: Every day | ORAL | Status: AC
  Administered 2022-01-23 – 2022-01-27 (×5): 20 mg via ORAL
  Filled 2022-01-22 (×5): qty 1

## 2022-01-22 MED ORDER — POTASSIUM CHLORIDE CRYS ER 20 MEQ PO TBCR
40.0000 meq | EXTENDED_RELEASE_TABLET | Freq: Once | ORAL | Status: AC
  Administered 2022-01-22: 40 meq via ORAL
  Filled 2022-01-22: qty 2

## 2022-01-22 MED ORDER — PANTOPRAZOLE SODIUM 40 MG PO TBEC
40.0000 mg | DELAYED_RELEASE_TABLET | Freq: Every day | ORAL | Status: DC
  Administered 2022-01-22 – 2022-01-27 (×6): 40 mg via ORAL
  Filled 2022-01-22 (×6): qty 1

## 2022-01-22 MED ORDER — LACTULOSE 10 GM/15ML PO SOLN
20.0000 g | Freq: Every day | ORAL | Status: DC
  Filled 2022-01-22 (×4): qty 30

## 2022-01-22 MED ORDER — SENNA 8.6 MG PO TABS
1.0000 | ORAL_TABLET | Freq: Two times a day (BID) | ORAL | Status: DC
  Administered 2022-01-22 – 2022-01-26 (×6): 8.6 mg via ORAL
  Filled 2022-01-22 (×10): qty 1

## 2022-01-22 MED ORDER — OXYCODONE HCL 5 MG PO TABS
5.0000 mg | ORAL_TABLET | ORAL | Status: DC | PRN
  Administered 2022-01-23 (×2): 5 mg via ORAL
  Filled 2022-01-22 (×2): qty 1

## 2022-01-22 NOTE — Progress Notes (Signed)
Transferred pt to 3E-07 in 3E bed with monitor. Notified wife [name] of movement via phone call. ?

## 2022-01-22 NOTE — Evaluation (Signed)
Physical Therapy Evaluation ?Patient Details ?Name: Jeffrey Stephens ?MRN: [id number] ?DOB: Nov 13, 1940 ?Today's Date: 01/22/2022 ? ?History of Present Illness ? 81 yo male admitted 4/28 with SOB, hypoxic with Afib with RVR requiring cardioversion and intubation; 4/9 fall at home with Rt hip fx s/p Im nail 4/11, TDWB  PMhx: AAA, basal cell CA, urinary frequency, GERD, DVT LLE, cerebral aneurysm, kidney stones, lung CA, PAF, RA, stroke, TIA, depression.  ?Clinical Impression ?  ?Pt admitted with above diagnosis. Lives at home with wife and helpful family, in a single-level home with a few steps to enter, but they have a ramp; Prior to previous admission, pt was able to walk with a rollator, but with falls; Since last admission, he has been TWB RLE, and using a wheelchair for mobility, pivoting or lateral scooting to wheelchair with assist; sleeping in recliner; Presents to PT with functional dependencies, generalized weakness, and decr functional capacity; Performed lateral scoot transfer bed to recliner with 2 person min assist and very dclose guard for safety, maintianing TWB RLE well; O2 sats dropped considerably with activity, and required at least 5 minutes seated rest for sats to get back to safer levels; Also noted SBP drop with upright activity, and seated upright in recliner with feet down, improved with feet elevated; consider TED hose;  Pt currently with functional limitations due to the deficits listed below (see PT Problem List). Pt will benefit from skilled PT to increase their independence and safety with mobility to allow discharge to the venue listed below.      ? ?Pt missed his two week follow up with Orthopedics -- can we get a consult while in hospital? ?   ? ?Recommendations for follow up therapy are one component of a multi-disciplinary discharge planning process, led by the attending physician.  Recommendations may be updated based on patient status, additional functional criteria and insurance  authorization. ? ?Follow Up Recommendations Acute inpatient rehab (3hours/day) (worth considering, especially as his resp status improves and his activity [place] improves; Family not agreeing to SNF, and pt particiapting well) ? ?  ?Assistance Recommended at Discharge Frequent or constant Supervision/Assistance  ?Patient can return home with the following ? Two people to help with walking and/or transfers;Two people to help with bathing/dressing/bathroom;Assistance with cooking/housework;Direct supervision/assist for medications management;Direct supervision/assist for financial management;Assist for transportation;Help with stairs or ramp for entrance ? ?  ?Equipment Recommendations Other (comment) (Medical Lucianne Lei transport home; and check that their [place] has a drop-arm)  ?Recommendations for Other Services ? OT consult  ?  ?Functional Status Assessment Patient has had a recent decline in their functional status and demonstrates the ability to make significant improvements in function in a reasonable and predictable amount of time.  ? ?  ?Precautions / Restrictions Precautions ?Precautions: Fall ?Precaution Comments: watch O2 sats, goal 92% and > ?Restrictions ?RLE Weight Bearing: Non weight bearing ?Other Position/Activity Restrictions: TDWB for transfers  ? ?  ? ?Mobility ? Bed Mobility ?Overal bed mobility: Needs Assistance ?Bed Mobility: Supine to Sit ?  ?  ?Supine to sit: Mod assist, +2 for physical assistance, +2 for safety/equipment ?  ?  ?General bed mobility comments: Mod assist to lift trunk to sit; good effort to move LEs to EOB, and handheld assist ?  ? ?Transfers ?Overall transfer level: Needs assistance ?  ?Transfers: Bed to chair/wheelchair/BSC ?  ?  ?  ?  ?  ? Lateral/Scoot Transfers: Min assist, +2 physical assistance, +2 safety/equipment (Heavy cues for safety and technqiue) ?  General transfer comment: Transferred bed to recliner on his L (with armrest down) via lateral scoot; Heavy cues and very  close guard for safety; initially scooted a bit too far forward before turning to scoot sideways to L; step by step cues for hand placement and safety; Very fatigued, winded once up in reclienr, and needed at least 5 minutes of seated rest and focused breathing to get O2 sats back to 93% or higher ?  ? ?Ambulation/Gait ?  ?  ?  ?  ?  ?  ?  ?  ? ?Stairs ?  ?  ?  ?  ?  ? ?Wheelchair Mobility ?  ? ?Modified Rankin (Stroke Patients Only) ?  ? ?  ? ?Balance   ?  ?Sitting balance-Leahy Scale: Fair ?  ?  ?  ?  ?  ?  ?  ?  ?  ?  ?  ?  ?  ?  ?  ?  ?   ? ? ? ?Pertinent Vitals/Pain Pain Assessment ?Pain Assessment: No/denies pain ?Pain Intervention(s): Monitored during session  ? ? ?Home Living Family/patient expects to be discharged to:: Private residence ?Living Arrangements: Spouse/significant other ?Available Help at Discharge: Family;Available 24 hours/day ?Type of Home: House ?Home Access: Stairs to enter;Ramped entrance ?Entrance Stairs-Rails: Right;Left ?Entrance Stairs-Number of Steps: 4 ?  ?Home Layout: One level ?Home Equipment: Rolling Walker (2 wheels);BSC/3in1;Wheelchair - manual (sliding board, BSC is a drop-arm) ?Additional Comments: Sleeps in recliner at home  ?  ?Prior Function Prior Level of Function : Needs assist ?  ?  ?  ?Physical Assist : Mobility (physical) ?Mobility (physical): Gait ?  ?Mobility Comments: gait on rollator at home, falls; after recent fall with hip fx, surgical fixation, TDWB, used a wheelchair for mobility ?ADLs Comments: Since recent hip fx, has used a urinal, and perhaps a bedpan; not sure that he has gotten to the drop-arm [facility] ?  ? ? ?Hand Dominance  ? Dominant Hand: Right ? ?  ?Extremity/Trunk Assessment  ? Upper Extremity Assessment ?Upper Extremity Assessment: Defer to OT evaluation;Generalized weakness ?  ? ?Lower Extremity Assessment ?Lower Extremity Assessment: Generalized weakness ?RLE Deficits / Details: Weak hip, though able to recruit hip muscles to help move R foot to  EOB ?RLE Coordination: decreased gross motor ?  ? ?   ?Communication  ? Communication: HOH  ?Cognition Arousal/Alertness: Awake/alert ?Behavior During Therapy: [facility] for tasks assessed/performed ?Overall Cognitive Status: History of cognitive impairments - at baseline ?  ?  ?  ?  ?  ?  ?  ?  ?  ?  ?  ?  ?  ?  ?  ?  ?General Comments: has had a stroke and aneurysm, mult cognitive insults; pt at times making jokes possibly to cover deficits; pt frequently asking PTA "do you have somewhere to be" although therapist not rushing him and allowing him time to recover. ?  ?  ? ?  ?General Comments General comments (skin integrity, edema, etc.): Session conducted on 4 L supplemental O2 via [place]; O2 sats decr to 83% with transfer activity; incr back to 93% or greater with at least 5 min seated rest ? ?  ?Exercises    ? ?Assessment/Plan  ?  ?PT Assessment Patient needs continued PT services  ?PT Problem List Decreased strength;Decreased range of motion;Decreased activity tolerance;Decreased balance;Decreased mobility;Decreased coordination;Decreased cognition;Decreased safety awareness;Decreased knowledge of precautions;Cardiopulmonary status limiting activity;Decreased skin integrity;Pain;Obesity ? ?   ?  ?PT Treatment Interventions DME instruction;Functional mobility training;Therapeutic  activities;Therapeutic exercise;Balance training;Cognitive remediation;Patient/family education;Wheelchair mobility training   ? ?PT Goals (Current goals can be found in the Care Plan section)  ?Acute Rehab PT Goals ?Patient Stated Goal: To get OOB ?PT Goal Formulation: With patient/family ?Time For Goal Achievement: 02/05/22 ?Potential to Achieve Goals: Fair ? ?  ?Frequency Min 3X/week ?  ? ? ?Co-evaluation   ?  ?  ?  ?  ? ? ?  ?AM-PAC PT "6 Clicks" Mobility  ?Outcome Measure Help needed turning from your back to your side while in a flat bed without using bedrails?: A Lot ?Help needed moving from lying on your back to sitting on the side of a  flat bed without using bedrails?: A Lot ?Help needed moving to and from a bed to a chair (including a wheelchair)?: Total ?Help needed standing up from a chair using your arms (e.g., wheelchair or bedside cha

## 2022-01-22 NOTE — Progress Notes (Signed)
Patient arrived to unit, telemetry initiated and verified. VSS. Will continue to monitor. ?

## 2022-01-22 NOTE — Progress Notes (Signed)
? ?  NAME:  Jeffrey Stephens, MRN:  [id number], DOB:  May 09, 1941, LOS: 2 ?ADMISSION DATE:  01/20/2022, CONSULTATION DATE:  4/28 ?REFERRING MD:  [place], CHIEF COMPLAINT:  Dyspnea  ? ?History of Present Illness:  ?81 yo male with multiple medical problems presented ot the ER with dyspnea and acute hypoxemic respiratory failure due to rhinovirus pneumonia 2 weeks after having a fall and IM nail after a R hip fracture. ? ?Pertinent  Medical History  ?Aflutter ?DVT ?Chronic eliquis ?AAA ?Hip fx s/p IM nail ?Chronic hypoxia ?COPD  ?Lung cancer ? ?Significant Hospital Events: ?Including procedures, antibiotic start and stop dates in addition to other pertinent events   ?4/28 ED for SOB, hypoxia. Intubated. Sync CV for Afib RVR. Abc for PNA. Admit to ICU, started on unasyn; RSV panel positive for rhinovirus ?4/29 awake on ventilator, extubated ? ?Interim History / Subjective:  ? ?Extubated ?Wants to eat ?Coughing ?Breathing is OK ? ?Objective   ?Blood pressure 113/69, pulse (!) 103, temperature 98.2 ?F (36.8 ?C), temperature source Oral, resp. rate (!) 23, height 5\' 9"  (1.753 m), weight 104.3 kg, SpO2 100 %. ?   ?Vent Mode: Stand-by ?FiO2 (%):  [40 %] 40 % ?Set Rate:  [22 bmp] 22 bmp ?Vt Set:  [480 mL] 480 mL ?PEEP:  [5 cmH20-8 cmH20] 5 cmH20 ?Pressure Support:  [8 cmH20] 8 cmH20  ? ?Intake/Output Summary (Last 24 hours) at 01/22/2022 0735 ?Last data filed at 01/22/2022 0600 ?Gross per 24 hour  ?Intake 1030.5 ml  ?Output 3170 ml  ?Net -2139.5 ml  ? ?Filed Weights  ? 01/20/22 0721  ?Weight: 104.3 kg  ? ? ?Examination: ? ?General:  Resting comfortably in bed ?HENT: NCAT OP clear ?PULM: few rhonchi, no wheezing B, normal effort ?CV: RRR, no mgr ?GI: BS+, soft, nontender ?MSK: normal bulk and tone ?Derm: persistent pedal edema ?Neuro: awake, alert, no distress, MAEW ? ? ? ?Resolved Hospital Problem list   ? ? ?Assessment & Plan:  ?Acute on chronic respiratory failure with hypoxemia due to rhinovirus pneumonia ([place]) ?Improved ?Wean off  O2 for O2 sat > 88% ?Out of bed, mobilize ? ? ? ?Acute pulmonary edema (HCC) ?Lasix again today ? ?Abdominal aortic aneurysm ([place]) ?F/u imaging as outpatient ? ?Acute metabolic encephalopathy ?Due to sepsis ?Improved ?Frequent orientation ?Hold sedatives ? ?Atrial fibrillation with rapid ventricular response ([place]) ?Diltiazem ?eliquis ?tele ? ?CAP (community acquired pneumonia) ?Due to rhinovirus ?Monitor off antibiotics ? ?CKD stage G3a/A1, GFR 45-59 and albumin creatinine ratio <30 mg/g (HCC) ?Monitor BMET and UOP ?Replace electrolytes as needed ? ? ?COPD exacerbation ([place]) ?Continue brovana/pulmicort ?Continue prn albuterol ?Change hydrocortisone to prednisone 20mg  daily x 5 days ? ?History of DVT (deep vein thrombosis) ?eliquis ? ?Hyponatremia ?Monitor  ? ?Pneumonia due to virus ?As above ? ?Lung cancer, lower lobe ([place]) ?F/u with outpatient clinic ? ?Rheumatoid arthritis ([place]) ?Treatment for which is causing him to be immunocompromised (methotrexate) ?Hold methotrexate ?  ? ?Best Practice (right click and "Reselect all SmartList Selections" daily)  ? ?Diet/type: Regular consistency (see orders) ?DVT prophylaxis: DOAC ?GI prophylaxis: PPI ?Lines: N/A ?Foley:  N/A ?Code Status:  full code ?Last date of multidisciplinary goals of care discussion [4/28; granddaughter updated bedside on 4/30] ? ? Transfer to [facility], PCCM service ? ?Critical care time: n/a minutes ?  ? ? ?Roselie Awkward, MD ?[facility] ?Pager: [phone] ?Cell: [phone] ?After 7:00 pm call Elink  [phone] ? ?

## 2022-01-22 NOTE — Progress Notes (Signed)
Inpatient Rehab Admissions Coordinator:  ? ?Per therapy recommendations, patient was screened for CIR candidacy by Clemens Catholic, MS, CCC-SLP. At this time, Pt. does not appear to demonstrate medical necessity to justify in hospital rehabilitation/CIR. will not pursue a rehab consult for this Pt.   Recommend other rehab venues to be pursued.  Please contact me with any questions. ? ? ?Clemens Catholic, MS, CCC-SLP ?Rehab Admissions Coordinator  ?[phone] (celll) ?[phone] (office) ? ?

## 2022-01-22 NOTE — Progress Notes (Addendum)
Patient had an episode of diarrhea and states that he got something earlier for it. RN did not see anything on the [facility], so provider paged for orders. ? ?Patient is on lactulose, disregard message. ? ?Patient was actually asking if medication could be stopped, please advise. ?

## 2022-01-23 ENCOUNTER — Telehealth: Payer: Self-pay | Admitting: Orthopedic Surgery

## 2022-01-23 ENCOUNTER — Other Ambulatory Visit: Payer: Self-pay | Admitting: Orthopedic Surgery

## 2022-01-23 ENCOUNTER — Inpatient Hospital Stay (HOSPITAL_COMMUNITY): Payer: Medicare PPO

## 2022-01-23 DIAGNOSIS — J189 Pneumonia, unspecified organism: Secondary | ICD-10-CM | POA: Diagnosis not present

## 2022-01-23 DIAGNOSIS — I4891 Unspecified atrial fibrillation: Secondary | ICD-10-CM | POA: Diagnosis not present

## 2022-01-23 DIAGNOSIS — G9341 Metabolic encephalopathy: Secondary | ICD-10-CM

## 2022-01-23 DIAGNOSIS — J9621 Acute and chronic respiratory failure with hypoxia: Secondary | ICD-10-CM | POA: Diagnosis not present

## 2022-01-23 LAB — GLUCOSE, CAPILLARY
Glucose-Capillary: 108 mg/dL — ABNORMAL HIGH (ref 70–99)
Glucose-Capillary: 121 mg/dL — ABNORMAL HIGH (ref 70–99)
Glucose-Capillary: 124 mg/dL — ABNORMAL HIGH (ref 70–99)
Glucose-Capillary: 124 mg/dL — ABNORMAL HIGH (ref 70–99)
Glucose-Capillary: 131 mg/dL — ABNORMAL HIGH (ref 70–99)
Glucose-Capillary: 189 mg/dL — ABNORMAL HIGH (ref 70–99)

## 2022-01-23 LAB — COMPREHENSIVE METABOLIC PANEL
ALT: 43 U/L (ref 0–44)
AST: 71 U/L — ABNORMAL HIGH (ref 15–41)
Albumin: 2.2 g/dL — ABNORMAL LOW (ref 3.5–5.0)
Alkaline Phosphatase: 79 U/L (ref 38–126)
Anion gap: 8 (ref 5–15)
BUN: 35 mg/dL — ABNORMAL HIGH (ref 8–23)
CO2: 32 mmol/L (ref 22–32)
Calcium: 8.7 mg/dL — ABNORMAL LOW (ref 8.9–10.3)
Chloride: 101 mmol/L (ref 98–111)
Creatinine, Ser: 1.04 mg/dL (ref 0.61–1.24)
GFR, Estimated: >60 mL/min (ref >60)
Glucose, Bld: 120 mg/dL — ABNORMAL HIGH (ref 70–99)
Potassium: 4 mmol/L (ref 3.5–5.1)
Sodium: 141 mmol/L (ref 135–145)
Total Bilirubin: 1.3 mg/dL — ABNORMAL HIGH (ref 0.3–1.2)
Total Protein: 5.5 g/dL — ABNORMAL LOW (ref 6.5–8.1)

## 2022-01-23 LAB — HEMOGLOBIN A1C
Hgb A1c MFr Bld: 5.1 % (ref 4.8–5.6)
Mean Plasma Glucose: 100 mg/dL

## 2022-01-23 LAB — LEGIONELLA PNEUMOPHILA SEROGP 1 UR AG: L. pneumophila Serogp 1 Ur Ag: NEGATIVE

## 2022-01-23 MED ORDER — INSULIN ASPART 100 UNIT/ML IJ SOLN
0.0000 [IU] | Freq: Three times a day (TID) | INTRAMUSCULAR | Status: DC
  Administered 2022-01-23 (×2): 2 [IU] via SUBCUTANEOUS
  Administered 2022-01-23: 3 [IU] via SUBCUTANEOUS
  Administered 2022-01-24 (×2): 2 [IU] via SUBCUTANEOUS
  Administered 2022-01-24: 3 [IU] via SUBCUTANEOUS
  Administered 2022-01-25: 2 [IU] via SUBCUTANEOUS
  Administered 2022-01-25: 3 [IU] via SUBCUTANEOUS
  Administered 2022-01-26 – 2022-01-27 (×3): 2 [IU] via SUBCUTANEOUS

## 2022-01-23 MED ORDER — UMECLIDINIUM BROMIDE 62.5 MCG/ACT IN AEPB
1.0000 | INHALATION_SPRAY | Freq: Every day | RESPIRATORY_TRACT | Status: DC
  Administered 2022-01-24 – 2022-01-27 (×4): 1 via RESPIRATORY_TRACT
  Filled 2022-01-23: qty 7

## 2022-01-23 MED ORDER — INSULIN ASPART 100 UNIT/ML IJ SOLN: 0.0000 [IU] | Freq: Every day | INTRAMUSCULAR | Status: DC

## 2022-01-23 MED ORDER — FUROSEMIDE 40 MG PO TABS
40.0000 mg | ORAL_TABLET | Freq: Every day | ORAL | Status: DC
  Administered 2022-01-23 – 2022-01-27 (×5): 40 mg via ORAL
  Filled 2022-01-23 (×5): qty 1

## 2022-01-23 MED ORDER — SODIUM CHLORIDE 0.9 % IV SOLN
3.0000 g | Freq: Four times a day (QID) | INTRAVENOUS | Status: DC
  Filled 2022-01-23 (×2): qty 8

## 2022-01-23 MED ORDER — ENSURE ENLIVE PO LIQD
237.0000 mL | Freq: Three times a day (TID) | ORAL | Status: DC
  Administered 2022-01-23 – 2022-01-27 (×8): 237 mL via ORAL

## 2022-01-23 NOTE — Progress Notes (Signed)
Nutrition Follow-up ? ?DOCUMENTATION CODES:  ? ?Not applicable ? ?INTERVENTION:  ?Provide Ensure Enlive po TID, each supplement provides 350 kcal and 20 grams of protein. ? ?Encourage adequate PO intake.  ? ?NUTRITION DIAGNOSIS:  ? ?Inadequate oral intake related to inability to eat as evidenced by NPO status; diet advanced; progressing ? ?GOAL:  ? ?Patient will meet greater than or equal to 90% of their needs; progressing ? ?MONITOR:  ? ?PO intake, Supplement acceptance, Labs, Weight trends, Skin, I & O's ? ?REASON FOR ASSESSMENT:  ? ?Ventilator, Consult ?Enteral/tube feeding initiation and management ? ?ASSESSMENT:  ? ?81 year old male who presented to the ED on 4/28 with SOB and Afib RVR. Pt required intubation in the ED. PMH of recent hip fx s/p surgery, atrial flutter, prior DVT, AAA, lung cancer, COPD. Pt admitted with acute on chronic respiratory failure, CAP. Pt with acute on chronic hypoxic/hypercarbic respiratory failure due to rhinovirus PNA COPD exacerbation. ? ?Extubated 4/29. Pt is currently on a regular diet. Meal completion has been 10-25%. RD to order nutritional supplements to aid in caloric and protein needs. Labs and medications reviewed.  ? ?Diet Order:   ?Diet Order   ? ?       ?  Diet regular Room service appropriate? Yes; Fluid consistency: Thin  Diet effective now       ?  ? ?  ?  ? ?  ? ? ?EDUCATION NEEDS:  ? ?Not appropriate for education at this time ? ?Skin:  Skin Assessment: Reviewed RN Assessment ? ?Last BM:  4/30 ? ?Height:  ? ?Ht Readings from Last 1 Encounters:  ?01/22/22 5\' 9"  (1.753 m)  ? ? ?Weight:  ? ?Wt Readings from Last 1 Encounters:  ?01/23/22 108.1 kg  ? ?BMI:  Body mass index is 35.19 kg/m?. ? ?Estimated Nutritional Needs:  ? ?Kcal:  1900-2100 ? ?Protein:  110-130 grams ? ?Fluid:  1.9-2.1 L ? ?Corrin Parker, MS, RD, LDN ?RD pager number/after hours weekend pager number on Amion. ? ?

## 2022-01-23 NOTE — Progress Notes (Deleted)
Pharmacy Antibiotic Note ? ?Jeffrey Stephens is a 81 y.o. male admitted on 01/20/2022, now with aspiration pneumonia.  Pharmacy has been consulted for Unasyn dosing. ? ?Plan: ?Unasyn 3g IV Q6H. ? ?Height: 5\' 9"  (175.3 cm) ?Weight: 108.1 kg (238 lb 5.1 oz) ?IBW/kg (Calculated) : 70.7 ? ?Temp (24hrs), Avg:98.2 ?F (36.8 ?C), Min:97.7 ?F (36.5 ?C), Max:98.7 ?F (37.1 ?C) ? ?Recent Labs  ?Lab 01/20/22 ?0659 01/20/22 ?1747 01/20/22 ?1229 01/21/22 ?1595 01/21/22 ?1436 01/22/22 ?0256 01/23/22 ?0104  ?WBC 7.9  --   --  9.1  --   --   --   ?CREATININE 1.26*  --   --  1.28* 1.12 1.07 1.04  ?LATICACIDVEN  --  1.2 1.2  --   --   --   --   ?  ?Estimated Creatinine Clearance: 69.8 mL/min (by C-G formula based on SCr of 1.04 mg/dL).   ? ?No Known Allergies ? ? ?Thank you for allowing pharmacy to be a part of this patient?s care. ? ?Wynona Neat, PharmD, BCPS  ?01/23/2022 5:18 AM ? ?

## 2022-01-23 NOTE — Progress Notes (Addendum)
?      ?                 PROGRESS NOTE ? ?      ?PATIENT DETAILS ?Name: Jeffrey Stephens ?Age: 81 y.o. ?Sex: male ?Date of Birth: July 17, 1941 ?Admit Date: 01/20/2022 ?Admitting Physician Jacky Kindle, MD ?[id number], Jenny Reichmann, MD ? ?Brief Summary: ?Patient is a 81 y.o.  male with history of atrial flutter on Eliquis, VTE, lung cancer s/p lobectomy 2017, RA on methotrexate, COPD home O2 -recent hospitalization from 4/9-4/16 with right hip fracture following a mechanical fall-s/p ORIF-presented to the hospital with shortness of breath/cough-patient was found to have acute on chronic hypoxic/hypercapnic respiratory failure requiring intubation-due to rhinovirus PNA, COPD exacerbation and A-fib with RVR.  Patient was stabilized in the ICU-extubated on 4/29-and subsequently transferred to [facility] on 5/1. ? ?Significant events: ?4/9-4/16>> right hip fracture-s/p ORIF-discharged home with home health. ?4/28>> presented to ED with shortness of breath-intubated-severe hypoxemia due to rhinovirus-A-fib RVR s/p synchronized cardioversion in the ED ?4/28>> extubated ?5/01>> transfer to [facility] ? ?Significant studies: ?4/28>> interstitial thickening superimposed on obstructive lung disease-likely PNA versus CHF. ?4/28>> Echo: EF 60-65% ? ?Significant microbiology data: ?4/28>> COVID/influenza PCR: Negative ?4/28>> respiratory virus panel: Rhinovirus +ve ?4/28>> urine culture: No growth ?4/28>> blood culture: No growth ? ?Procedures: ?EGD: 4/28>> 4/29 ? ?Consults: ?PCCM,ortho ? ?Subjective: ?Lying comfortably in bed-denies any chest pain or shortness of breath. ? ?Objective: ?Vitals: ?Blood pressure 120/75, pulse (!) 107, temperature 98.1 ?F (36.7 ?C), temperature source Oral, resp. rate (!) 24, height 5\' 9"  (1.753 m), weight 108.1 kg, SpO2 91 %.  ? ?Exam: ?Gen Exam:Alert awake-not in any distress ?HEENT:atraumatic, normocephalic ?Chest: B/L clear to auscultation anteriorly ?CVS:S1S2 regular ?Abdomen:soft non tender, non  distended ?Extremities:+ edema ?Neurology: Non focal ?Skin: no rash ? ?Pertinent Labs/Radiology: ? ?  Latest Ref Rng & Units 01/21/2022  ?  4:39 AM 01/21/2022  ?  2:23 AM 01/20/2022  ? 10:32 AM  ?CBC  ?WBC 4.0 - 10.5 K/uL  9.1     ?Hemoglobin 13.0 - 17.0 g/dL 7.8   7.2   9.9    ?Hematocrit 39.0 - 52.0 % 23.0   24.6   29.0    ?Platelets 150 - 400 K/uL  356     ?  ?Lab Results  ?Component Value Date  ? NA 141 01/23/2022  ? K 4.0 01/23/2022  ? CL 101 01/23/2022  ? CO2 32 01/23/2022  ?  ? ? ?Assessment/Plan: ?Acute on chronic hypoxic/hypercarbic respiratory failure due to rhinovirus PNA COPD exacerbation: Improved-stable on 4 L of oxygen.  Continue bronchodilators and tapering prednisone.  Per family-patient has been on 3-4 L of oxygen after his recent hospitalization for hip fracture. ? ?Acute metabolic encephalopathy: Due to hypercarbia-resolved-currently awake and alert. ? ?Atrial flutter: Appears to go in and out of atrial flutter-rate controlled with diltiazem-on Eliquis.  Continue telemetry monitoring. ? ?Acute on chronic HFpEF: Continues to have some amount of lower extremity edema-continue IV Lasix today. ? ?History of DVT: Remains on Eliquis. ? ?History of AAA: Outpatient surveillance imaging. ? ?CKD stage IIIa: Creatinine close to baseline-monitor periodically. ? ?Normocytic anemia: Due to combination of acute illness-possible recent blood loss from hip fracture.  Repeat CBC tomorrow. ? ?History of lung cancer-s/p lobectomy 2017: Stable-follow-up with his outpatient physicians. ? ?RA: Methotrexate on hold ? ?History of recent right hip fracture requiring IM nailing: Ortho-Kohlmann Jeffries-PA-C-notified-he will notify Dr. Marlou Sa. ? ?Debility/deconditioning: Evaluated by PT/OT-recommendations are for CIR. ? ?Nutrition Status: ?Nutrition  Problem: Inadequate oral intake ?Etiology: inability to eat ?Signs/Symptoms: NPO status ?Interventions: Tube feeding, Prostat ? ?Obesity: ?Estimated body mass index is 35.19 kg/m? as  calculated from the following: ?  Height as of this encounter: 5\' 9"  (1.753 m). ?  Weight as of this encounter: 108.1 kg.  ? ?Code status: ?  Code Status: Full Code  ? ?DVT Prophylaxis: ?apixaban (ELIQUIS) tablet 5 mg   ? ? ?Family Communication: Daughter-[name]-[phone]-updated over the phone. ? ? ?Disposition Plan: ?Status is: Inpatient ?Remains inpatient appropriate because: Resolving hypoxemia-rhinovirus PNA-HFpEF exacerbation-probably needs 1-2 more days for further optimization before discharging to CIR. ?  ?Planned Discharge Destination: Possible CIR. ? ? ?Diet: ?Diet Order   ? ?       ?  Diet regular Room service appropriate? Yes; Fluid consistency: Thin  Diet effective now       ?  ? ?  ?  ? ?  ?  ? ? ?Antimicrobial agents: ?Anti-infectives (From admission, onward)  ? ? Start     Dose/Rate Route Frequency Ordered Stop  ? 01/23/22 0600  Ampicillin-Sulbactam (UNASYN) 3 g in sodium chloride 0.9 % 100 mL IVPB  Status:  Discontinued       ? 3 g ?200 mL/hr over 30 Minutes Intravenous Every 6 hours 01/23/22 0519 01/23/22 0520  ? 01/21/22 1000  vancomycin (VANCOREADY) IVPB 1250 mg/250 mL  Status:  Discontinued       ? 1,250 mg ?166.7 mL/hr over 90 Minutes Intravenous Every 24 hours 01/20/22 0822 01/21/22 1013  ? 01/20/22 1700  Ampicillin-Sulbactam (UNASYN) 3 g in sodium chloride 0.9 % 100 mL IVPB  Status:  Discontinued       ? 3 g ?200 mL/hr over 30 Minutes Intravenous Every 6 hours 01/20/22 0917 01/21/22 1124  ? 01/20/22 0830  vancomycin (VANCOREADY) IVPB 1750 mg/350 mL       ? 1,750 mg ?175 mL/hr over 120 Minutes Intravenous  Once 01/20/22 0822 01/20/22 1231  ? 01/20/22 0815  piperacillin-tazobactam (ZOSYN) IVPB 3.375 g       ? 3.375 g ?100 mL/hr over 30 Minutes Intravenous  Once 01/20/22 3382 01/20/22 0942  ? ?  ? ? ? ?MEDICATIONS: ?Scheduled Meds: ? apixaban  5 mg Oral BID  ? arformoterol  15 mcg Nebulization BID  ? budesonide (PULMICORT) nebulizer solution  0.5 mg Nebulization BID  ? Chlorhexidine Gluconate  Cloth  6 each Topical Daily  ? diltiazem  30 mg Oral Q6H  ? furosemide  40 mg Oral Daily  ? insulin aspart  0-15 Units Subcutaneous TID WC  ? insulin aspart  0-5 Units Subcutaneous QHS  ? lactulose  20 g Oral Daily  ? mouth rinse  15 mL Mouth Rinse BID  ? pantoprazole  40 mg Oral QHS  ? predniSONE  20 mg Oral Q breakfast  ? senna  1 tablet Oral BID  ? umeclidinium bromide  1 puff Inhalation Daily  ? ?Continuous Infusions: ?PRN Meds:.albuterol, docusate sodium, lip balm, ondansetron (ZOFRAN) IV, oxyCODONE, polyethylene glycol ? ? ?I have personally reviewed following labs and imaging studies ? ?LABORATORY DATA: ?CBC: ?Recent Labs  ?Lab 01/20/22 ?5053 01/20/22 ?0824 01/20/22 ?1032 01/21/22 ?0223 01/21/22 ?0439  ?WBC 7.9  --   --  9.1  --   ?NEUTROABS 6.9  --   --   --   --   ?HGB 8.6* 9.9* 9.9* 7.2* 7.8*  ?HCT 29.5* 29.0* 29.0* 24.6* 23.0*  ?MCV 100.3*  --   --  96.5  --   ?  PLT 499*  --   --  356  --   ? ? ?Basic Metabolic Panel: ?Recent Labs  ?Lab 01/20/22 ?1683 01/20/22 ?0824 01/20/22 ?1229 01/20/22 ?1556 01/21/22 ?0223 01/21/22 ?0439 01/21/22 ?1436 01/22/22 ?0256 01/23/22 ?0104  ?NA 133*   < >  --   --  137 137 140 139 141  ?K 5.2*   < >  --   --  4.8 4.7 4.6 3.8 4.0  ?CL 99  --   --   --  104  --  104 103 101  ?CO2 25  --   --   --  27  --  26 30 32  ?GLUCOSE 126*  --   --   --  193*  --  115* 125* 120*  ?BUN 33*  --   --   --  39*  --  39* 35* 35*  ?CREATININE 1.26*  --   --   --  1.28*  --  1.12 1.07 1.04  ?CALCIUM 9.2  --   --   --  8.8*  --  8.8* 8.4* 8.7*  ?MG  --   --  2.2 2.2 2.2  --   --   --   --   ?PHOS  --   --   --  3.8 2.6  --   --   --   --   ? < > = values in this interval not displayed.  ? ? ?GFR: ?Estimated Creatinine Clearance: 69.8 mL/min (by C-G formula based on SCr of 1.04 mg/dL). ? ?Liver Function Tests: ?Recent Labs  ?Lab 01/20/22 ?7290 01/22/22 ?0256 01/23/22 ?0104  ?AST 34 23 71*  ?ALT 21 19 43  ?ALKPHOS 21 11 55  ?BILITOT 1.8* 1.2 1.3*  ?PROT 6.4* 5.3* 5.5*  ?ALBUMIN 2.8* 2.0* 2.2*  ? ?No  results for input(s): LIPASE, AMYLASE in the last 168 hours. ?No results for input(s): AMMONIA in the last 168 hours. ? ?Coagulation Profile: ?No results for input(s): INR, PROTIME in the last 168 hours. ? ?Cardiac Enzy

## 2022-01-23 NOTE — Progress Notes (Signed)
OT Cancellation Note ? ?Patient Details ?Name: Jeffrey Stephens ?MRN: [id number] ?DOB: Dec 05, 1940 ? ? ?Cancelled Treatment:    Reason Eval/Treat Not Completed: Other (comment). Pt in the middle of eating breakfast, will check back later this morning for evaluation.  ? ?[place], OTR/L ?Acute Rehab Services ?Pager [phone] ?Office [phone] ? ? ? ?Almon Register ?01/23/2022, 7:53 AM ?

## 2022-01-23 NOTE — Progress Notes (Signed)
Inpatient Rehabilitation Admissions Coordinator  ? ?I will place order for rehab consult for full assessment of dispo needs. ? ?Danne Baxter, RN, MSN ?Rehab Admissions Coordinator ?[phone] ?01/23/2022 1:18 PM ? ?

## 2022-01-23 NOTE — Plan of Care (Signed)
?  Problem: Education: ?Goal: Knowledge of disease or condition will improve ?Outcome: Progressing ?  ?Problem: Education: ?Goal: Knowledge of the prescribed therapeutic regimen will improve ?Outcome: Progressing ?  ?Problem: Respiratory: ?Goal: Levels of oxygenation will improve ?Outcome: Progressing ?  ?

## 2022-01-23 NOTE — Progress Notes (Signed)
Events noted.  I did talk with Jeffrey Stephens's wife.  We will order radiographs of the right hip and either [name] or I will see [name] tomorrow..  Possible advancement of weightbearing at that time. ?

## 2022-01-23 NOTE — Telephone Encounter (Signed)
I called.  Radiographs ordered.  We will follow-up with him in hospital.  Discussed with wife.

## 2022-01-23 NOTE — Telephone Encounter (Signed)
Patient's daughter called. She would like to know if Dr. Marlou Sa could see patient while he is in the hospital. He is at [facility]. Her call back number [phone] ?

## 2022-01-23 NOTE — Evaluation (Signed)
Occupational Therapy Evaluation ?Patient Details ?Name: Jeffrey Stephens ?MRN: [id number] ?DOB: Feb 16, 1941 ?Today's Date: 01/23/2022 ? ? ?History of Present Illness 81 yo male admitted 4/28 with SOB, hypoxic with Afib with RVR requiring cardioversion and intubation; 4/9 fall at home with Rt hip fx s/p Im nail 4/11, TDWB  PMhx: AAA, basal cell CA, urinary frequency, GERD, DVT LLE, cerebral aneurysm, kidney stones, lung CA, PAF, RA, stroke, TIA, depression.  ? ?Clinical Impression ?  ?This 81 yo male admitted with above presents to acute OT with needing A pta for basic ADLs due to recent hip fx and s/p IM nail. He was able to stand pivot or use a transfer board for transfers and go around in a wheel chair, slept in a recliner. Currently he is +1 Mod A for bed mobility with step by step cues and HOB up and was unable to stand pivot with RW today due too painful RLE thus making it suspect he was putting too much weight on RLE--(used sara stedy--where he did not c/o of this pain). He will continue to benefit from acute OT with follow up on AIR primary recommendation. ?   ? ?Recommendations for follow up therapy are one component of a multi-disciplinary discharge planning process, led by the attending physician.  Recommendations may be updated based on patient status, additional functional criteria and insurance authorization.  ? ?Follow Up Recommendations ? Acute inpatient rehab (3hours/day) (if this is not feasible then SNF then HH--pt very deconditoned and needs +2 A for all mobility.)  ?  ?Assistance Recommended at Discharge Frequent or constant Supervision/Assistance  ?Patient can return home with the following Two people to help with walking and/or transfers;Two people to help with bathing/dressing/bathroom;Assistance with cooking/housework;Help with stairs or ramp for entrance;Direct supervision/assist for medications management;Direct supervision/assist for financial management;Assist for transportation ? ?   ?Functional Status Assessment ? Patient has had a recent decline in their functional status and demonstrates the ability to make significant improvements in function in a reasonable and predictable amount of time.  ?Equipment Recommendations ?  (hospital bed and hoyer lift if he goes home--he has been sleeping in a recliner.)  ?  ?   ?Precautions / Restrictions Precautions ?Precautions: Fall ?Precaution Comments: watch O2 sats, goal 92% ?Restrictions ?Weight Bearing Restrictions: Yes ?RLE Weight Bearing: Non weight bearing ?Other Position/Activity Restrictions: TDWB for transfers  ? ?  ? ?Mobility Bed Mobility ?Overal bed mobility: Needs Assistance ?Bed Mobility: Supine to Sit ?  ?Sidelying to sit: Mod assist, HOB elevated ?  ?  ?  ?General bed mobility comments: total A for sequencing, Mod A for legs and Mod A for trunk ?  ? ?Transfers ?Overall transfer level: Needs assistance ?Equipment used: Rolling walker (2 wheels) ?Transfers: Sit to/from Stand, Bed to chair/wheelchair/BSC ?Sit to Stand: Mod assist, From elevated surface ?Stand pivot transfers: Mod assist, +2 safety/equipment ?  ?  ?  ?  ?General transfer comment: Attempted sit>stand from bed to pivot to relciner but upon standing he said he could not do it duet to right knee hurting too much (which also meant he was putting too much weight on RLE). Then used sara stedy for transfer. ?Transfer via Lift Equipment: Stedy ? ?  ?Balance Overall balance assessment: Needs assistance, History of Falls ?Sitting-balance support: Feet supported, No upper extremity supported ?Sitting balance-Leahy Scale: Fair ?  ?  ?Standing balance support: Bilateral upper extremity supported, During functional activity ?Standing balance-Leahy Scale: Poor ?Standing balance comment: use of sara stedy with RW  did not work for him ?  ?  ?  ?  ?  ?  ?  ?  ?  ?  ?  ?   ? ?ADL either performed or assessed with clinical judgement  ? ?ADL Overall ADL's : Needs  assistance/impaired ?Eating/Feeding: Set up;Supervision/ safety;Sitting ?Eating/Feeding Details (indicate cue type and reason): in recliner ?Grooming: Set up;Supervision/safety;Sitting ?Grooming Details (indicate cue type and reason): in recliner ?Upper Body Bathing: Minimal assistance;Sitting ?Upper Body Bathing Details (indicate cue type and reason): in recliner ?Lower Body Bathing: Total assistance ?Lower Body Bathing Details (indicate cue type and reason): Mod A sit<>stand from raised bed (but can only maintain standing ~15 seconds before he says he needs to sit down ?Upper Body Dressing : Moderate assistance;Sitting ?Upper Body Dressing Details (indicate cue type and reason): in recliner ?Lower Body Dressing: Total assistance ?Lower Body Dressing Details (indicate cue type and reason): Mod A sit<>stand from raised bed (but can only maintain standing ~15 seconds before he says he needs to sit down ?Toilet Transfer: Total assistance ?Toilet Transfer Details (indicate cue type and reason): Mod A sit<>stand from raised bed (but can only maintain standing ~15 seconds before he says he needs to sit down; used sara stedy ?Toileting- Clothing Manipulation and Hygiene: Total assistance ?Toileting - Clothing Manipulation Details (indicate cue type and reason): Mod A sit<>stand from raised bed (but can only maintain standing ~15 seconds before he says he needs to sit down ?  ?  ?  ?   ? ? ? ?Vision Patient Visual Report: No change from baseline ?   ?   ?   ?   ? ?Pertinent Vitals/Pain Pain Assessment ?Pain Assessment: 0-10 ?Faces Pain Scale: Hurts even more ?Pain Location: R knee with attempt to stand to turn to recliner from bed with RW ?Pain Descriptors / Indicators: Aching, Sore ?Pain Intervention(s): Limited activity within patient's tolerance, Monitored during session, Repositioned  ? ? ? ?Hand Dominance Right ?  ?Extremity/Trunk Assessment Upper Extremity Assessment ?Upper Extremity Assessment: Generalized  weakness ?  ?  ?  ?  ?  ?Communication Communication ?Communication: HOH ?  ?Cognition Arousal/Alertness: Awake/alert ?Behavior During Therapy: [facility] for tasks assessed/performed ?Overall Cognitive Status: History of cognitive impairments - at baseline ?  ?  ?  ?  ?  ?  ?  ?  ?  ?  ?  ?  ?  ?  ?  ?  ?General Comments: has had a stroke and aneurysm, mult cognitive insults. Pt A & O x4. Followed all commands. Did ask me to repeat things at time (pt is [facility]) ?  ?  ?   ?   ?   ? ? ?Home Living Family/patient expects to be discharged to:: Private residence ?Living Arrangements: Spouse/significant other ?Available Help at Discharge: Family;Available 24 hours/day ?Type of Home: House ?Home Access: Stairs to enter;Ramped entrance ?Entrance Stairs-Number of Steps: 4 ?Entrance Stairs-Rails: Right;Left ?Home Layout: One level ?  ?  ?  ?  ?Bathroom Toilet: Standard ?  ?  ?Home Equipment: Rolling Walker (2 wheels);BSC/3in1;Wheelchair - manual ?  ?Additional Comments: Sleeps in recliner at home ?  ? ?  ?Prior Functioning/Environment Prior Level of Function : Needs assist ?  ?  ?  ?Physical Assist : Mobility (physical) ?Mobility (physical): Gait ?  ?Mobility Comments: gait on rollator at home pta --now only stand pivots, falls; after recent fall with hip fx, surgical fixation, TDWB, used a wheelchair for mobility ?ADLs Comments: Since recent hip fx,  has used a urinal ?  ? ?  ?  ?OT Problem List: Decreased strength;Decreased range of motion;Decreased activity tolerance;Impaired balance (sitting and/or standing);Decreased safety awareness;Decreased knowledge of precautions;Cardiopulmonary status limiting activity;Pain ?  ?   ?OT Treatment/Interventions: Self-care/ADL training;DME and/or AE instruction;Patient/family education;Balance training  ?  ?OT Goals(Current goals can be found in the care plan section) Acute Rehab OT Goals ?Patient Stated Goal: to feel better and go home ?OT Goal Formulation: With patient ?Time For Goal  Achievement: 02/06/22 ?Potential to Achieve Goals: Good  ?OT Frequency: Min 2X/week ?  ? ?   ?AM-PAC OT "6 Clicks" Daily Activity     ?Outcome Measure Help from another person eating meals?: A Little ?Help from another person taking care of personal grooming?: A

## 2022-01-23 NOTE — TOC Progression Note (Signed)
Transition of Care (TOC) - Progression Note  ? ? ?Patient Details  ?Name: DEVARION MCCLANAHAN ?MRN: [id number] ?Date of Birth: 1940/12/05 ? ?Transition of Care (TOC) CM/SW Contact  ?Zenon Mayo, RN ?Phone Number: ?01/23/2022, 8:36 AM ? ?Clinical Narrative:    ? ?Transition of Care (TOC) Screening Note ? ? ?Patient Details  ?Name: GLYNN YEPES ?Date of Birth: Sep 09, 1941 ? ? ?Transition of Care (TOC) CM/SW Contact:    ?Zenon Mayo, RN ?Phone Number: ?01/23/2022, 8:36 AM ? ? ? ?Transition of Care Department [facility]) has reviewed patient and no TOC needs have been identified at this time. We will continue to monitor patient advancement through interdisciplinary progression rounds. If new patient transition needs arise, please place a TOC consult. ?  ? ? ?  ?  ? ?Expected Discharge Plan and Services ?  ?  ?  ?  ?  ?                ?  ?  ?  ?  ?  ?  ?  ?  ?  ?  ? ? ?Social Determinants of Health (SDOH) Interventions ?  ? ?Readmission Risk Interventions ?   ? View : No data to display.  ?  ?  ?  ? ? ?

## 2022-01-23 NOTE — Care Management Important Message (Signed)
Important Message ? ?Patient Details  ?Name: Jeffrey Stephens ?MRN: [id number] ?Date of Birth: [birth year] ? ? ?Medicare Important Message Given:  Yes ? ? ? ? ?Jeffrey Stephens ?01/23/2022, 8:47 AM ?

## 2022-01-24 DIAGNOSIS — I4891 Unspecified atrial fibrillation: Secondary | ICD-10-CM | POA: Diagnosis not present

## 2022-01-24 DIAGNOSIS — G9341 Metabolic encephalopathy: Secondary | ICD-10-CM | POA: Diagnosis not present

## 2022-01-24 DIAGNOSIS — J9621 Acute and chronic respiratory failure with hypoxia: Secondary | ICD-10-CM | POA: Diagnosis not present

## 2022-01-24 DIAGNOSIS — J189 Pneumonia, unspecified organism: Secondary | ICD-10-CM | POA: Diagnosis not present

## 2022-01-24 LAB — BASIC METABOLIC PANEL
Anion gap: 6 (ref 5–15)
BUN: 26 mg/dL — ABNORMAL HIGH (ref 8–23)
CO2: 32 mmol/L (ref 22–32)
Calcium: 8.9 mg/dL (ref 8.9–10.3)
Chloride: 100 mmol/L (ref 98–111)
Creatinine, Ser: 0.96 mg/dL (ref 0.61–1.24)
GFR, Estimated: >60 mL/min (ref >60)
Glucose, Bld: 114 mg/dL — ABNORMAL HIGH (ref 70–99)
Potassium: 3.9 mmol/L (ref 3.5–5.1)
Sodium: 138 mmol/L (ref 135–145)

## 2022-01-24 LAB — CBC
HCT: 26.9 % — ABNORMAL LOW (ref 39.0–52.0)
Hemoglobin: 8.4 g/dL — ABNORMAL LOW (ref 13.0–17.0)
MCH: 29.5 pg (ref 26.0–34.0)
MCHC: 31.2 g/dL (ref 30.0–36.0)
MCV: 94.4 fL (ref 80.0–100.0)
Platelets: 375 10*3/uL (ref 150–400)
RBC: 2.85 MIL/uL — ABNORMAL LOW (ref 4.22–5.81)
RDW: 19.8 % — ABNORMAL HIGH (ref 11.5–15.5)
WBC: 11.4 10*3/uL — ABNORMAL HIGH (ref 4.0–10.5)
nRBC: 0.4 % — ABNORMAL HIGH (ref 0.0–0.2)

## 2022-01-24 LAB — GLUCOSE, CAPILLARY
Glucose-Capillary: 150 mg/dL — ABNORMAL HIGH (ref 70–99)
Glucose-Capillary: 130 mg/dL — ABNORMAL HIGH (ref 70–99)
Glucose-Capillary: 160 mg/dL — ABNORMAL HIGH (ref 70–99)
Glucose-Capillary: 127 mg/dL — ABNORMAL HIGH (ref 70–99)

## 2022-01-24 NOTE — Progress Notes (Signed)
Radiographs reviewed.  Right femur looks good in terms of no change from immediate postop radiographs.  Okay to discontinue sutures.  Still in surgery at this time.  We will see the patient in the morning.  Touchdown weightbearing for transfers only on that right leg for the next 2 weeks and once we see callus formation around the fracture we will advance his weightbearing. ?

## 2022-01-24 NOTE — Progress Notes (Signed)
?      ?                 PROGRESS NOTE ? ?      ?PATIENT DETAILS ?Name: Jeffrey Stephens ?Age: 81 y.o. ?Sex: male ?Date of Birth: 1941-04-20 ?Admit Date: 01/20/2022 ?Admitting Physician Jacky Kindle, MD ?[id number], Jenny Reichmann, MD ? ?Brief Summary: ?Patient is a 81 y.o.  male with history of atrial flutter on Eliquis, VTE, lung cancer s/p lobectomy 2017, RA on methotrexate, COPD home O2 -recent hospitalization from 4/9-4/16 with right hip fracture following a mechanical fall-s/p ORIF-presented to the hospital with shortness of breath/cough-patient was found to have acute on chronic hypoxic/hypercapnic respiratory failure requiring intubation-due to rhinovirus PNA, COPD exacerbation and A-fib with RVR.  Patient was stabilized in the ICU-extubated on 4/29-and subsequently transferred to [facility] on 5/1. ? ?Significant events: ?4/9-4/16>> right hip fracture-s/p ORIF-discharged home with home health. ?4/28>> presented to ED with shortness of breath-intubated-severe hypoxemia due to rhinovirus-A-fib RVR s/p synchronized cardioversion in the ED ?4/28>> extubated ?5/01>> transfer to [facility] ? ?Significant studies: ?4/28>> interstitial thickening superimposed on obstructive lung disease-likely PNA versus CHF. ?4/28>> Echo: EF 60-65% ? ?Significant microbiology data: ?4/28>> COVID/influenza PCR: Negative ?4/28>> respiratory virus panel: Rhinovirus +ve ?4/28>> urine culture: No growth ?4/28>> blood culture: No growth ? ?Procedures: ?EGD: 4/28>> 4/29 ? ?Consults: ?PCCM,ortho ? ?Subjective: ?Stable on 4 L of oxygen (home regimen).  Spouse at bedside.  Continues to have some swelling in his lower extremities. ? ?Objective: ?Vitals: ?Blood pressure (!) 122/55, pulse 87, temperature (!) 97.5 ?F (36.4 ?C), temperature source Oral, resp. rate (!) 22, height 5\' 9"  (1.753 m), weight 108.4 kg, SpO2 94 %.  ? ?Exam: ?Gen Exam:Alert awake-not in any distress ?HEENT:atraumatic, normocephalic ?Chest: B/L clear to auscultation anteriorly ?CVS:S1S2  regular ?Abdomen:soft non tender, non distended ?Extremities:+ edema ?Neurology: Non focal ?Skin: no rash  ? ?Pertinent Labs/Radiology: ? ?  Latest Ref Rng & Units 01/24/2022  ?  5:10 AM 01/21/2022  ?  4:39 AM 01/21/2022  ?  2:23 AM  ?CBC  ?WBC 4.0 - 10.5 K/uL 11.4    9.1    ?Hemoglobin 13.0 - 17.0 g/dL 8.4   7.8   7.2    ?Hematocrit 39.0 - 52.0 % 26.9   23.0   24.6    ?Platelets 150 - 400 K/uL 375    356    ?  ?Lab Results  ?Component Value Date  ? NA 138 01/24/2022  ? K 3.9 01/24/2022  ? CL 100 01/24/2022  ? CO2 32 01/24/2022  ? ?  ? ? ?Assessment/Plan: ?Acute on chronic hypoxic/hypercarbic respiratory failure due to rhinovirus PNA COPD exacerbation: Significantly better-back on his usual home regimen of 4 L.  Continue tapering prednisone and bronchodilators.  ? ?Acute metabolic encephalopathy: Due to hypercarbia-resolved-currently awake and alert. ? ?Persistent atrial flutter: Remains in atrial flutter-rate controlled with Cardizem-on Eliquis.  ? ?Acute on chronic HFpEF: Volume status continues to improve-continue IV Lasix for 1 additional day. ? ?History of DVT: Remains on Eliquis. ? ?History of AAA: Outpatient surveillance imaging. ? ?CKD stage IIIa: Creatinine close to baseline-monitor periodically. ? ?Normocytic anemia: Due to combination of acute illness-possible recent blood loss from hip fracture.  Repeat CBC tomorrow. ? ?History of lung cancer-s/p lobectomy 2017: Stable-follow-up with his outpatient physicians. ? ?RA: Methotrexate on hold ? ?History of recent right hip fracture requiring IM nailing: Ortho following-we will await further recommendations in regards to advancement in his mobility restrictions. ? ?Debility/deconditioning: Evaluated by PT/OT-recommendations  are for CIR. ? ?Nutrition Status: ?Nutrition Problem: Inadequate oral intake ?Etiology: inability to eat ?Signs/Symptoms: NPO status ?Interventions: Ensure Enlive (each supplement provides 350kcal and 20 grams of protein) ? ?Obesity: ?Estimated  body mass index is 35.29 kg/m? as calculated from the following: ?  Height as of this encounter: 5\' 9"  (1.753 m). ?  Weight as of this encounter: 108.4 kg.  ? ?Code status: ?  Code Status: Full Code  ? ?DVT Prophylaxis: ?apixaban (ELIQUIS) tablet 5 mg   ? ? ?Family Communication: Daughter-[name]-updated over the phone on 5/1-spouse at bedside on 5/2. ? ? ?Disposition Plan: ?Status is: Inpatient ?Remains inpatient appropriate because: Resolving hypoxemia-rhinovirus PNA-HFpEF exacerbation-probably needs 1-2 more days for further optimization before discharging to CIR. ?  ?Planned Discharge Destination: Possible CIR. ? ? ?Diet: ?Diet Order   ? ?       ?  Diet regular Room service appropriate? Yes; Fluid consistency: Thin  Diet effective now       ?  ? ?  ?  ? ?  ?  ? ? ?Antimicrobial agents: ?Anti-infectives (From admission, onward)  ? ? Start     Dose/Rate Route Frequency Ordered Stop  ? 01/23/22 0600  Ampicillin-Sulbactam (UNASYN) 3 g in sodium chloride 0.9 % 100 mL IVPB  Status:  Discontinued       ? 3 g ?200 mL/hr over 30 Minutes Intravenous Every 6 hours 01/23/22 0519 01/23/22 0520  ? 01/21/22 1000  vancomycin (VANCOREADY) IVPB 1250 mg/250 mL  Status:  Discontinued       ? 1,250 mg ?166.7 mL/hr over 90 Minutes Intravenous Every 24 hours 01/20/22 0822 01/21/22 1013  ? 01/20/22 1700  Ampicillin-Sulbactam (UNASYN) 3 g in sodium chloride 0.9 % 100 mL IVPB  Status:  Discontinued       ? 3 g ?200 mL/hr over 30 Minutes Intravenous Every 6 hours 01/20/22 0917 01/21/22 1124  ? 01/20/22 0830  vancomycin (VANCOREADY) IVPB 1750 mg/350 mL       ? 1,750 mg ?175 mL/hr over 120 Minutes Intravenous  Once 01/20/22 0822 01/20/22 1231  ? 01/20/22 0815  piperacillin-tazobactam (ZOSYN) IVPB 3.375 g       ? 3.375 g ?100 mL/hr over 30 Minutes Intravenous  Once 01/20/22 1224 01/20/22 0942  ? ?  ? ? ? ?MEDICATIONS: ?Scheduled Meds: ? apixaban  5 mg Oral BID  ? arformoterol  15 mcg Nebulization BID  ? budesonide (PULMICORT)  nebulizer solution  0.5 mg Nebulization BID  ? Chlorhexidine Gluconate Cloth  6 each Topical Daily  ? diltiazem  30 mg Oral Q6H  ? feeding supplement  237 mL Oral TID BM  ? furosemide  40 mg Oral Daily  ? insulin aspart  0-15 Units Subcutaneous TID WC  ? insulin aspart  0-5 Units Subcutaneous QHS  ? lactulose  20 g Oral Daily  ? mouth rinse  15 mL Mouth Rinse BID  ? pantoprazole  40 mg Oral QHS  ? predniSONE  20 mg Oral Q breakfast  ? senna  1 tablet Oral BID  ? umeclidinium bromide  1 puff Inhalation Daily  ? ?Continuous Infusions: ?PRN Meds:.albuterol, docusate sodium, lip balm, ondansetron (ZOFRAN) IV, oxyCODONE, polyethylene glycol ? ? ?I have personally reviewed following labs and imaging studies ? ?LABORATORY DATA: ?CBC: ?Recent Labs  ?Lab 01/20/22 ?8250 01/20/22 ?0824 01/20/22 ?1032 01/21/22 ?0223 01/21/22 ?0439 01/24/22 ?0510  ?WBC 7.9  --   --  9.1  --  11.4*  ?NEUTROABS 6.9  --   --   --   --   --   ?  HGB 8.6* 9.9* 9.9* 7.2* 7.8* 8.4*  ?HCT 29.5* 29.0* 29.0* 24.6* 23.0* 26.9*  ?MCV 100.3*  --   --  96.5  --  94.4  ?PLT 499*  --   --  356  --  375  ? ? ? ?Basic Metabolic Panel: ?Recent Labs  ?Lab 01/20/22 ?1229 01/20/22 ?1556 01/21/22 ?0223 01/21/22 ?0439 01/21/22 ?1436 01/22/22 ?0256 01/23/22 ?0104 01/24/22 ?0510  ?NA  --   --  137 137 140 139 141 138  ?K  --   --  4.8 4.7 4.6 3.8 4.0 3.9  ?CL  --   --  104  --  104 103 101 100  ?CO2  --   --  27  --  26 30 32 32  ?GLUCOSE  --   --  193*  --  115* 125* 120* 114*  ?BUN  --   --  39*  --  39* 35* 35* 26*  ?CREATININE  --   --  1.28*  --  1.12 1.07 1.04 0.96  ?CALCIUM  --   --  8.8*  --  8.8* 8.4* 8.7* 8.9  ?MG 2.2 2.2 2.2  --   --   --   --   --   ?PHOS  --  3.8 2.6  --   --   --   --   --   ? ? ? ?GFR: ?Estimated Creatinine Clearance: 75.7 mL/min (by C-G formula based on SCr of 0.96 mg/dL). ? ?Liver Function Tests: ?Recent Labs  ?Lab 01/20/22 ?6010 01/22/22 ?0256 01/23/22 ?0104  ?AST 34 23 71*  ?ALT 21 19 43  ?ALKPHOS 93 23 55  ?BILITOT 1.8* 1.2 1.3*  ?PROT  6.4* 5.3* 5.5*  ?ALBUMIN 2.8* 2.0* 2.2*  ? ? ?No results for input(s): LIPASE, AMYLASE in the last 168 hours. ?No results for input(s): AMMONIA in the last 168 hours. ? ?Coagulation Profile: ?No results for inpu

## 2022-01-24 NOTE — Progress Notes (Signed)
PT Cancellation Note ? ?Patient Details ?Name: Jeffrey Stephens ?MRN: [id number] ?DOB: Oct 16, 1940 ? ? ?Cancelled Treatment:    Reason Eval/Treat Not Completed: (P) Other (comment) (per chart review, Dr Marlou Sa planning to see him to review WB status after imaging done on 5/1. Will defer PT tx session until WB status clarified to optimize plan of care.) Will continue efforts per PT plan of care as schedule permits once WB status confirmed. ? ? ?Jeffrey Stephens ?01/24/2022, 3:11 PM ? ? ?

## 2022-01-24 NOTE — Progress Notes (Signed)
Inpatient Rehabilitation Admissions Coordinator  ? ?I met with patient at bedside with his son for rehab assessment. We discussed goals and expectations of a possible Cir admit prior to return home. They prefer CIR. Noted ortho with follow up imaging to determine current weight bearing status. Once that is clarified, we can then better clarify activity tolerance and dispo options. ? ?Danne Baxter, RN, MSN ?Rehab Admissions Coordinator ?[phone] ?01/24/2022 11:34 AM ? ?

## 2022-01-24 NOTE — Telephone Encounter (Signed)
noted 

## 2022-01-24 NOTE — Plan of Care (Signed)
  Problem: Activity: Goal: Ability to tolerate increased activity will improve Outcome: Progressing   

## 2022-01-25 DIAGNOSIS — C3430 Malignant neoplasm of lower lobe, unspecified bronchus or lung: Secondary | ICD-10-CM | POA: Diagnosis not present

## 2022-01-25 DIAGNOSIS — G9341 Metabolic encephalopathy: Secondary | ICD-10-CM | POA: Diagnosis not present

## 2022-01-25 DIAGNOSIS — J9621 Acute and chronic respiratory failure with hypoxia: Secondary | ICD-10-CM | POA: Diagnosis not present

## 2022-01-25 LAB — CULTURE, BLOOD (ROUTINE X 2)
Culture: NO GROWTH
Culture: NO GROWTH
Special Requests: ADEQUATE
Special Requests: ADEQUATE

## 2022-01-25 LAB — GLUCOSE, CAPILLARY
Glucose-Capillary: 117 mg/dL — ABNORMAL HIGH (ref 70–99)
Glucose-Capillary: 138 mg/dL — ABNORMAL HIGH (ref 70–99)
Glucose-Capillary: 147 mg/dL — ABNORMAL HIGH (ref 70–99)
Glucose-Capillary: 140 mg/dL — ABNORMAL HIGH (ref 70–99)

## 2022-01-25 LAB — BASIC METABOLIC PANEL
Anion gap: 6 (ref 5–15)
BUN: 21 mg/dL (ref 8–23)
CO2: 31 mmol/L (ref 22–32)
Calcium: 8.6 mg/dL — ABNORMAL LOW (ref 8.9–10.3)
Chloride: 100 mmol/L (ref 98–111)
Creatinine, Ser: 0.83 mg/dL (ref 0.61–1.24)
GFR, Estimated: >60 mL/min (ref >60)
Glucose, Bld: 124 mg/dL — ABNORMAL HIGH (ref 70–99)
Potassium: 4 mmol/L (ref 3.5–5.1)
Sodium: 137 mmol/L (ref 135–145)

## 2022-01-25 NOTE — Progress Notes (Addendum)
Inpatient Rehabilitation Admissions Coordinator   ? ?Noted clarification of weight bearing status last night by Dr Marlou Sa. I await therapy updates today with that clarification, to begin insurance Auth with [facility] for a possible Cir admit. Approval is not guaranteed, so other rehab venue options need to also be discussed with TOC and pt/family. Acute team and TOC updated. ? ?Danne Baxter, RN, MSN ?Rehab Admissions Coordinator ?[phone] ?01/25/2022 9:06 AM ? ?

## 2022-01-25 NOTE — PMR Pre-admission (Shared)
PMR Admission Coordinator Pre-Admission Assessment ? ?Patient: Jeffrey Stephens is an 81 y.o., male ?MRN: [id number] ?DOB: [birth year] ?Height: 5\' 9"  (175.3 cm) ?Weight: 108.9 kg ? ?Insurance Information ?HMO:     PPO:      PCP:      IPA:      80/20:      OTHER:  ?PRIMARY: [employer]      Policy#: [id number]      Subscriber: pt ?CM Name: ***      Phone#: [phone] ext ***     Fax#: [phone] ?Pre-Cert#: #[id number] approved for ** days and f/u with **      Employer:  ?Benefits:  Phone #: [phone]     Name: 5/3 ?Eff. Date: 09/26/19     Deduct: none      Out of Pocket Max: $4000       ?CIR: $160 co pay per day days 1 until 10      SNF: 100% ?Outpatient: $20 per visit     Co-Pay: visit per medical neccesity ?Home Health: 100%      Co-Pay: visits per medical necessity ?DME: 80%     Co-Pay: 20% ?Providers: in network ? ?SECONDARY: none ? ?Financial Counselor:       Phone#:  ? ?The ?Data Collection Information Summary? for patients in Inpatient Rehabilitation Facilities with attached ?Privacy Act [place] Records? was provided and verbally reviewed with: Patient and Family ? ?Emergency Contact Information ?Contact Information   ? ? Name Relation Home Work Mobile  ? PRESNELL,ETHEL Spouse   [phone]  ? Kamil, Mchaffie Son [phone]    ? Collene Mares Daughter   [phone]  ? Auston,Tiffine Granddaughter [phone]    ? ?  ? ?Current Medical History  ?Patient Admitting Diagnosis: Respiratory failure ? ?History of Present Illness: 81 year old male with history of atrial flutter on Eliquis, Chronic hypoxic respiratory failure on home Oxygen, lung cancer with lobectomy 2017, RA on methotrexate, who had recent right hip fracture. Presented on 01/20/22 with increased shortness of breath, noted to be hypoxic and in afib with RVR. ? ?Required intubation due to rhinovirus PNA, COPD exacerbation and afib with RVR. Stabilized in ICU and extubated on 4/29. On his usual home O2 of 4 liters. Continue to taper  prednisone and bronchodilators. initially acute metabolic encephalopathy due to hypercarbia now resolved Remains in atrial flutter controlled on Cardizem and on Eliquis. Acute on chronic HFpEF with volume status improved and to continue on furosemide., Creatinine close to baseline with CKD stage IIIa. Methotrexate on hold. Dr Marlou Sa with orthopedics with repeat imaging and recommends continue to advance to [facility] RLE for transfers only for 2 weeks. ? ?Patient's medical record from [facility] has been reviewed by the rehabilitation admission coordinator and physician. ? ?Past Medical History  ?Past Medical History:  ?Diagnosis Date  ? AAA (abdominal aortic aneurysm) ([place])   ? a. followed by Dr. Deitra Mayo - 01/2016 CT: 3.6 cm infrarenal AAA.  ? Abnormal nuclear cardiac imaging test 07/13/2014  ? a. 06/2014 MV: possible reversible inferior wall defect,no wma, nl EF, felt to be artifact.  ? Basal cell carcinoma   ? "LLE; some on my head"  ? BPH (benign prostatic hyperplasia)   ? Cerebral aneurysm 1994  ? Constipation   ? Depression   ? DVT (deep venous thrombosis) ([place]) 2001  ? Hx of Left leg   ? Dysrhythmia   ? AFIB   ? Frequency of urination   ? GERD (gastroesophageal reflux disease)   ?  History of echocardiogram   ? a. 2D ECHO: 06/11/2014: EF 55-60%. Normal wall thickness. Indeterminate DD ( atrial flutter ). No RWMA. Mild LA dilation. Normal RV size and systolic function. No significant valvular abnormalities.  ? History of palpitations   ? OCCASIONAL  ? Kidney stones   ? Lung nodule   ? a. 02/2016 PET scan: intensely hypermetabolic 2.0 x 1.5 cm central RLL nodule (SUV 23).  ? Paroxysmal atrial flutter ([place])   ? a. On eliquis and sotalol (CHA2DS2VASc = 1).  ? Rheumatoid arthritis ([place])   ? "qwhere"  ? Stroke [facility] [place]) 1994  ? "when I had ruptured aneurysm in my head"  ? TIA (transient ischemic attack)   ? [place]  ? ?Has the patient had major surgery during 100 days prior to admission?  Yes ? ?Family History   ?family history includes Cancer in his brother; Diabetes in his father; Heart attack in his father; Heart disease in his brother and father. ? ?Current Medications ? ?Current Facility-Administered Medications:  ?  albuterol (PROVENTIL) (2.5 MG/3ML) 0.083% nebulizer solution 2.5 mg, 2.5 mg, Nebulization, Q2H PRN, Simonne Maffucci B, MD ?  apixaban (ELIQUIS) tablet 5 mg, 5 mg, Oral, BID, Simonne Maffucci B, MD, 5 mg at 01/25/22 0951 ?  arformoterol (BROVANA) nebulizer solution 15 mcg, 15 mcg, Nebulization, BID, Simonne Maffucci B, MD, 15 mcg at 01/25/22 0848 ?  budesonide (PULMICORT) nebulizer solution 0.5 mg, 0.5 mg, Nebulization, BID, McQuaid, Douglas B, MD, 0.5 mg at 01/25/22 0848 ?  Chlorhexidine Gluconate Cloth 2 % PADS 6 each, 6 each, Topical, Daily, Juanito Doom, MD, 6 each at 01/25/22 1357 ?  diltiazem (CARDIZEM) tablet 30 mg, 30 mg, Oral, Q6H, McQuaid, Douglas B, MD, 30 mg at 01/25/22 1210 ?  docusate sodium (COLACE) capsule 100 mg, 100 mg, Oral, BID PRN, Simonne Maffucci B, MD ?  feeding supplement (ENSURE ENLIVE / ENSURE PLUS) liquid 237 mL, 237 mL, Oral, TID BM, Ghimire, Henreitta Leber, MD, 237 mL at 01/24/22 2226 ?  furosemide (LASIX) tablet 40 mg, 40 mg, Oral, Daily, Ghimire, Henreitta Leber, MD, 40 mg at 01/25/22 0951 ?  insulin aspart (novoLOG) injection 0-15 Units, 0-15 Units, Subcutaneous, TID WC, Juanito Doom, MD, 3 Units at 01/25/22 1200 ?  insulin aspart (novoLOG) injection 0-5 Units, 0-5 Units, Subcutaneous, QHS, McQuaid, Douglas B, MD ?  lactulose (CHRONULAC) 10 GM/15ML solution 20 g, 20 g, Oral, Daily, McQuaid, Ronie Spies, MD ?  lip balm (CARMEX) ointment, , Topical, PRN, Juanito Doom, MD, 1 application. at 01/23/22 0517 ?  MEDLINE mouth rinse, 15 mL, Mouth Rinse, BID, Simonne Maffucci B, MD, 15 mL at 01/25/22 6761 ?  ondansetron (ZOFRAN) injection 4 mg, 4 mg, Intravenous, Q6H PRN, Simonne Maffucci B, MD ?  oxyCODONE (Oxy IR/ROXICODONE) immediate release tablet 5  mg, 5 mg, Oral, Q4H PRN, Simonne Maffucci B, MD, 5 mg at 01/23/22 2221 ?  pantoprazole (PROTONIX) EC tablet 40 mg, 40 mg, Oral, QHS, McQuaid, Douglas B, MD, 40 mg at 01/24/22 2226 ?  polyethylene glycol (MIRALAX / GLYCOLAX) packet 17 g, 17 g, Oral, Daily PRN, Simonne Maffucci B, MD ?  predniSONE (DELTASONE) tablet 20 mg, 20 mg, Oral, Q breakfast, McQuaid, Douglas B, MD, 20 mg at 01/25/22 0951 ?  senna (SENOKOT) tablet 8.6 mg, 1 tablet, Oral, BID, McQuaid, Douglas B, MD, 8.6 mg at 01/24/22 2226 ?  umeclidinium bromide (INCRUSE ELLIPTA) 62.5 MCG/ACT 1 puff, 1 puff, Inhalation, Daily, Ghimire, Henreitta Leber, MD, 1 puff at  01/25/22 0848 ? ?Patients Current Diet:  ?Diet Order   ? ?       ?  Diet regular Room service appropriate? Yes; Fluid consistency: Thin  Diet effective now       ?  ? ?  ?  ? ?  ? ?Precautions / Restrictions ?Precautions ?Precautions: Fall ?Precaution Comments: watch O2 sats, goal 92% ?Restrictions ?Weight Bearing Restrictions: Yes ?RLE Weight Bearing: Touchdown weight bearing ?Other Position/Activity Restrictions: [facility] for transfers only  ? ?Has the patient had 2 or more falls or a fall with injury in the past year? Yes ? ?Prior Activity Level ?Limited Community (1-2x/wk): wheelchair level at home since hip fx ? ?Prior Functional Level ?Self Care: Did the patient need help bathing, dressing, using the toilet or eating? Needed some help ? ?Indoor Mobility: Did the patient need assistance with walking from room to room (with or without device)? Needed some help ? ?Stairs: Did the patient need assistance with internal or external stairs (with or without device)? Needed some help ? ?Functional Cognition: Did the patient need help planning regular tasks such as shopping or remembering to take medications? Independent ? ?Patient Information ?Are you of Hispanic, Latino/a,or Spanish origin?: A. No, not of Hispanic, Latino/a, or Spanish origin ?What is your race?: A. White ?Do you need or want an interpreter to  communicate with a doctor or health care staff?: 0. No ? ?Patient's Response To:  ?Health Literacy and Transportation ?Is the patient able to respond to health literacy and transportation needs?: Yes ?Health Nicoletta Dress

## 2022-01-25 NOTE — Progress Notes (Signed)
Inpatient Rehabilitation Admissions Coordinator  ? ?I have begun Auth for possible CIr admit. ? ?Danne Baxter, RN, MSN ?Rehab Admissions Coordinator ?[phone] ?01/25/2022 2:39 PM ? ?

## 2022-01-25 NOTE — Progress Notes (Signed)
Occupational Therapy Treatment ?Patient Details ?Name: Jeffrey Stephens ?MRN: [id number] ?DOB: 02-15-1941 ?Today's Date: 01/25/2022 ? ? ?History of present illness 81 yo male admitted 4/28 with SOB, hypoxic with Afib with RVR requiring cardioversion and intubation; 4/9 fall at home with Rt hip fx s/p Im nail 4/11, TDWB  PMhx: AAA, basal cell CA, urinary frequency, GERD, DVT LLE, cerebral aneurysm, kidney stones, lung CA, PAF, RA, stroke, TIA, depression. ?  ?OT comments ? Pt is making slow progress towards goals.  Pt received supine in bed, agreeable to therapy session.  Pt demonstrating improved tolerance for rolling at bed level for LB bathing and dressing.  Pt able to recall recommendations for donning RLE first to increase ease with LB dressing.  Pt required mod +2 for bed mobility to come to sitting EOB.  Pt able to stand from elevated EOB with Max A +2 and pushing up through RW, however pt unable to maintain TDWB on RLE.  Pt then able to complete squat pivot transfer to L to w/c with Mod A +2 and cues to maintain RLE in position to maintain WB precautions.  Pt requiring decreased assist, Min A +2 when transferring back to bed (still on L side).  Pt is able to complete UB bathing dressing with assist seated EOB, but does require total assist for LB at bed level.  Pt typically not wearing LB clothing at home due to frequent incontinence.  Pt SpO2 89-95% on 4L O2 [place] with exertion and pt given brief seated breaks due to 2/4 dyspnea on exertion but recovers well and reports only mild to moderate RPE (fatigue) at end of session. Pt will continue to benefit from OT services to progress toward self-care and transfer goals.  ? ?Recommendations for follow up therapy are one component of a multi-disciplinary discharge planning process, led by the attending physician.  Recommendations may be updated based on patient status, additional functional criteria and insurance authorization. ?   ?Follow Up Recommendations ? Acute  inpatient rehab (3hours/day) (pt with improved activity tolerance and stable VS today on 4L [place]))  ?  ?Assistance Recommended at Discharge Frequent or constant Supervision/Assistance  ?Patient can return home with the following ? Two people to help with walking and/or transfers;Two people to help with bathing/dressing/bathroom;Assistance with cooking/housework;Help with stairs or ramp for entrance;Direct supervision/assist for medications management;Direct supervision/assist for financial management;Assist for transportation ?  ?Equipment Recommendations ? Other (comment) (hospital bed and hoyer lift if he goes home - he has been sleeping in a recliner)  ?  ?Recommendations for Other Services   ? ?  ?Precautions / Restrictions Precautions ?Precautions: Fall ?Precaution Comments: watch O2 sats, goal 92% ?Restrictions ?Weight Bearing Restrictions: Yes ?RLE Weight Bearing: Touchdown weight bearing ?Other Position/Activity Restrictions: TDWB for transfers only  ? ? ?  ? ?Mobility Bed Mobility ?Overal bed mobility: Needs Assistance ?Bed Mobility: Supine to Sit, Sit to Supine, Rolling ?Rolling: Min assist ?  ?Supine to sit: Mod assist, +2 for physical assistance (HOB ~30 deg) ?Sit to supine: Mod assist, +2 for physical assistance ?  ?General bed mobility comments: cues for improved technique and pt able to utilize bed rail with RUE but needs modA with LUE for trunk rise and BLE assist to reach EOB then also to bed BLE back over side of bed. Pt able to assist with posterior supine scooting toward HOB using BUE on overhead rail and needs mod cues for LLE use and technique, +2 mod/maxA with transfer pad. ?  ? ?Transfers ?  Overall transfer level: Needs assistance ?Equipment used: Rolling walker (2 wheels) ?Transfers: Sit to/from Stand, Bed to chair/wheelchair/BSC ?Sit to Stand: From elevated surface, Max assist, +2 physical assistance ?  ?Squat pivot transfers: Mod assist, +2 physical assistance, From elevated surface, Min  assist ?  ?  ?  ?General transfer comment: Attempted sit>stand from bed to pivot to wheelchair on his L side but upon standing pt unable to maintain only TWB (pt putting closer to 25% weight at least on RLE as it was advanced and pt using RW) Pt then performed squat pivot from bed>wheelchair, seated break, then wheelchair>EOB toward his L side each time with +2 modA initially and +2 minA on second trial. Pt with moderate DOE 2/4 and SpO2 89-95% on 4L O2 [place] (poor signal frequently due to cold fingers) ?  ?  ?Balance Overall balance assessment: Needs assistance, History of Falls ?Sitting-balance support: Feet supported, No upper extremity supported ?Sitting balance-Leahy Scale: Fair ?  ?  ?Standing balance support: Bilateral upper extremity supported, Reliant on assistive device for balance ?Standing balance-Leahy Scale: Zero ?Standing balance comment: +2 maxA for static standing ~5-10 seconds at RW, pt unable to maintain TDWB so could not assess dynamic standing for safety. ?  ?  ?  ?  ?  ?  ?  ?  ?  ?  ?  ?   ? ?ADL either performed or assessed with clinical judgement  ? ?ADL Overall ADL's : Needs assistance/impaired ?  ?  ?  ?  ?  ?  ?Lower Body Bathing: Total assistance ?Lower Body Bathing Details (indicate cue type and reason): Mod A rolling R and L with increased cues for hand placement to assist with rolling and physical assist to position RLE.  Pt incontinent of bowel, requiring total assist to clean ?  ?  ?Lower Body Dressing: Total assistance ?Lower Body Dressing Details (indicate cue type and reason): Mod A rolling R and L with increased cues for hand placement to assist with rolling and physical assist to position RLE.  Pt able to lift RLE from bed to allow therapist to thread incotinence brief.  Therapist educated on dressing RLE first to increase ease and independence ?Toilet Transfer: +2 for physical assistance;Moderate assistance;Maximal assistance ?Toilet Transfer Details (indicate cue type and  reason): Max A +2 sit <> stand from raised bed, however pt unable to maintain WB precautions therefore returned to sitting.  Completed squat pivot transfer Mod A +2 to w/c on L to simulate drop arm BSC and then Min A +2 when transferring back to L to bed.  Pt requiring multimodal cues for positioning of RLE to maintain WB precautions during transfer. ?  ?  ?  ?  ?Functional mobility during ADLs: Moderate assistance;Maximal assistance;+2 for physical assistance ?General ADL Comments: Max A +2 for sit <> stand attempt, ultimately completing squat pivot transfer min-mod A +2 for squat pivot transfer to L with cues/positioning for RLE to maintain WB precautions. ?  ? ?Extremity/Trunk Assessment Upper Extremity Assessment ?Upper Extremity Assessment: Generalized weakness ?  ?Lower Extremity Assessment ?Lower Extremity Assessment: Generalized weakness ?RLE Deficits / Details: Weak hip, though able to recruit hip muscles to help move R foot to EOB ?  ?  ?  ? ? ?Cognition Arousal/Alertness: Awake/alert ?Behavior During Therapy: [facility] for tasks assessed/performed ?Overall Cognitive Status: History of cognitive impairments - at baseline ?  ?  ?  ?  ?  ?  ?  ?  ?  ?  ?  ?  ?  ?  ?  ?  ?  General Comments: has had a stroke and aneurysm, mult cognitive insults. Pt A & O to self/situation/location and reports he remembers PTA from prior admission ~2 wks prior. Followed all commands. Did ask for things to be repeated at times (pt is [facility]) and needs frequent reminders for RLE TDWB especially with bed mobiltiy and sit>stand transfer attempt. ?  ?  ?   ?   ?   ?General Comments SpO2 89-95% on 4L O2 [place] (poor pleth signal at times, pt with cold fingers), pt with DOE 2/4 with exertion so given frequent brief rest breaks for 1-2 minutes to recover.  HR 95 bpm resting and up to 125 bpm with exertion (per dtr in room he has been in intermittent Afib) no dizziness reported with positional changes.  ? ? ?Pertinent Vitals/ Pain       Pain  Assessment ?Pain Assessment: Faces ?Faces Pain Scale: Hurts little more ?Pain Location: R hip ?Pain Descriptors / Indicators: Aching, Sore, Discomfort, Operative site guarding ?Pain Intervention(s): Monitored during sessio

## 2022-01-25 NOTE — Progress Notes (Signed)
Inpatient Rehabilitation Admissions Coordinator  ? ?I met with PT, OT, patient and daughter, [name], at bedside. Once therapy notes from today are available , I will begin Auth with [facility] for possible admit. ? ?Danne Baxter, RN, MSN ?Rehab Admissions Coordinator ?[phone] ?01/25/2022 12:17 PM ? ?

## 2022-01-25 NOTE — Progress Notes (Signed)
Patient seen this morning.  Incisions intact.  Sutures to be removed on the lateral side.  Overall radiographs unchanged in appearance from immediate postop radiographs.  Not too much callus formation yet.  I think it is okay for him to be weightbearing for transfers on that right leg but do not really want him walking on it yet.  Touchdown weightbearing should be fine if that is possible.  Okay for strengthening of the quad hamstring and hip flexors.  We will likely advance weightbearing status in 2 weeks after repeat radiographs. ?

## 2022-01-25 NOTE — Progress Notes (Addendum)
Physical Therapy Treatment ?Patient Details ?Name: Jeffrey Stephens ?MRN: [id number] ?DOB: 1941-07-10 ?Today's Date: 01/25/2022 ? ? ?History of Present Illness 81 yo male admitted 4/28 with SOB, hypoxic with Afib with RVR requiring cardioversion and intubation; 4/9 fall at home with Rt hip fx s/p Im nail 4/11, TDWB  PMhx: AAA, basal cell CA, urinary frequency, GERD, DVT LLE, cerebral aneurysm, kidney stones, lung CA, PAF, RA, stroke, TIA, depression. ? ?  ?PT Comments  ? ? Pt received in supine, alert and oriented and agreeable to therapy session with excellent participation and improved tolerance for transfer training. Pt performed sit<>stand from elevated bed<>RW with +2 maxA but unable to maintain TDWB on RLE with prolonged standing so pt returned to EOB. He then performed squat pivot transfer to and from elevated bed<>wheelchair with up to +2 modA, decreased assist +2 minA needed on return transfer. Per daughter, his recliner chair at home is elevated a few inches on a platform for ease of transfers. Pt SpO2 89-95% on 4L O2 [place] with exertion and pt given brief seated breaks due to 2/4 dyspnea on exertion but recovers well and reports only mild to moderate RPE (fatigue) at end of session. Pt back in bed at end of session as per RN sutures need to be removed. Pt continues to benefit from PT services to progress toward functional mobility goals.   ?Recommendations for follow up therapy are one component of a multi-disciplinary discharge planning process, led by the attending physician.  Recommendations may be updated based on patient status, additional functional criteria and insurance authorization. ? ?Follow Up Recommendations ? Acute inpatient rehab (3hours/day) (pt with improved activity tolerance and stable VS today on 4L [place]) ?  ?  ?Assistance Recommended at Discharge Frequent or constant Supervision/Assistance  ?Patient can return home with the following Two people to help with walking and/or transfers;Two people  to help with bathing/dressing/bathroom;Assistance with cooking/housework;Direct supervision/assist for medications management;Direct supervision/assist for financial management;Assist for transportation;Help with stairs or ramp for entrance ?  ?Equipment Recommendations ? Other (comment) (drop arm BSC would be ideal (may need to check if theirs has drop arm), medical van for transport home (per PT eval pending progress))  ?  ?Recommendations for Other Services   ? ? ?  ?Precautions / Restrictions Precautions ?Precautions: Fall ?Precaution Comments: watch O2 sats, goal 92% ?Restrictions ?Weight Bearing Restrictions: Yes ?RLE Weight Bearing: Touchdown weight bearing ?Other Position/Activity Restrictions: TDWB for transfers only  ?  ? ?Mobility ? Bed Mobility ?Overal bed mobility: Needs Assistance ?Bed Mobility: Supine to Sit, Sit to Supine, Rolling ?Rolling: Min assist ?  ?Supine to sit: Mod assist, +2 for physical assistance (HOB ~30 deg) ?Sit to supine: Mod assist, +2 for physical assistance ?  ?General bed mobility comments: cues for improved technique and pt able to utilize bed rail with RUE but needs modA with LUE for trunk rise and BLE assist to reach EOB then also to bed BLE back over side of bed. Pt able to assist with posterior supine scooting toward HOB using BUE on overhead rail and needs mod cues for LLE use and technique, +2 mod/maxA with transfer pad. ?  ? ?Transfers ?Overall transfer level: Needs assistance ?Equipment used: Rolling walker (2 wheels) ?Transfers: Sit to/from Stand, Bed to chair/wheelchair/BSC ?Sit to Stand: From elevated surface, Max assist, +2 physical assistance ?  ?  ?Squat pivot transfers: Mod assist, +2 physical assistance, From elevated surface, Min assist ?  ?  ?General transfer comment: Attempted sit>stand from bed  to pivot to wheelchair on his L side but upon standing pt unable to maintain only TWB (pt putting closer to 25% weight at least on RLE as it was advanced and pt using  RW) Pt then performed squat pivot from bed>wheelchair, seated break, then wheelchair>EOB toward his L side each time with +2 modA initially and +2 minA on second trial. Pt with moderate DOE 2/4 and SpO2 89-95% on 4L O2 [place] (poor signal frequently due to cold fingers) ?  ? ? ? ?  ?Balance Overall balance assessment: Needs assistance, History of Falls ?Sitting-balance support: Feet supported, No upper extremity supported ?Sitting balance-Leahy Scale: Fair ?  ?  ?Standing balance support: Bilateral upper extremity supported, Reliant on assistive device for balance ?Standing balance-Leahy Scale: Zero ?Standing balance comment: +2 maxA for static standing ~5-10 seconds at RW, pt unable to maintain TDWB so could not assess dynamic standing for safety. ?  ?  ?  ?  ?  ?  ?  ?  ?  ?  ?  ?  ? ?  ?Cognition Arousal/Alertness: Awake/alert ?Behavior During Therapy: [facility] for tasks assessed/performed ?Overall Cognitive Status: History of cognitive impairments - at baseline ?  ?  ?  ?   ?General Comments: has had a stroke and aneurysm, mult cognitive insults. Pt A & O to self/situation/location and reports he remembers PTA from prior admission ~2 wks prior. Followed all commands. Did ask me to repeat things at time (pt is [facility]) and needs frequent reminders for RLE TDWB especially with bed mobiltiy and sit>stand transfer attempt. ?  ?  ? ?  ?Exercises   ? ?  ?General Comments General comments (skin integrity, edema, etc.): SpO2 89-95% on 4L O2 [place] (poor pleth signal at times, pt with cold fingers), pt with DOE 2/4 with exertion so given frequent brief rest breaks for 1-2 minutes to recover. HR 95 bpm resting and up to 125 bpm with exertion (per dtr in room he has still been in intermittent Afib) no dizziness reported with positional changes ?  ?  ? ?Pertinent Vitals/Pain Pain Assessment ?Pain Assessment: Faces ?Faces Pain Scale: Hurts little more ?Pain Location: R hip ?Pain Descriptors / Indicators: Aching, Sore, Discomfort, Operative  site guarding ?Pain Intervention(s): Monitored during session, Repositioned, Other (comment) (pt defers pain meds prior to OOB mobility)  ? ? ? ?PT Goals (current goals can now be found in the care plan section) Acute Rehab PT Goals ?Patient Stated Goal: To get OOB and get some rehab so I can get stronger then go home ?PT Goal Formulation: With patient/family ?Time For Goal Achievement: 02/05/22 ?Progress towards PT goals: Progressing toward goals ? ?  ?Frequency ? ? ? Min 3X/week ? ? ? ?  ?PT Plan Current plan remains appropriate  ? ? ?   ?AM-PAC PT "6 Clicks" Mobility   ?Outcome Measure ? Help needed turning from your back to your side while in a flat bed without using bedrails?: A Little ?Help needed moving from lying on your back to sitting on the side of a flat bed without using bedrails?: A Lot ?Help needed moving to and from a bed to a chair (including a wheelchair)?: A Lot ?Help needed standing up from a chair using your arms (e.g., wheelchair or bedside chair)?: Total ?Help needed to walk in hospital room?: Total ?Help needed climbing 3-5 steps with a railing? : Total ?6 Click Score: 10 ? ?  ?End of Session Equipment Utilized During Treatment: Gait belt;Oxygen ?Activity Tolerance:  Patient tolerated treatment well ?Patient left: in bed;with call bell/phone within reach;with bed alarm set;with family/visitor present;Other (comment) (pt daughter assisting him to set up lunch tray, HOB >30 deg to eat) ?Nurse Communication: Mobility status;Other (comment);Weight bearing status (safe technique for squat pivot if he is able to get up to BSC/recliner again later in day) ?PT Visit Diagnosis: Muscle weakness (generalized) (M62.81);Other abnormalities of gait and mobility (R26.89);Pain;Dizziness and giddiness (R42);History of falling (Z91.81);Other (comment) ?Pain - Right/Left: Right ?Pain - part of body: Hip;Knee ?  ? ? ?Time: 8675-4492 ?PT Time Calculation (min) (ACUTE ONLY): 43 min ? ?Charges:  $Therapeutic  Activity: 23-37 mins          ?          ? ?Lillien Petronio P., PTA ?Acute Rehabilitation Services ?Secure Chat Preferred 9a-5:30pm ?Office: [phone]  ? ? ?Asli Tokarski M Jayant Kriz ?01/25/2022, 1:10 PM ? ?

## 2022-01-25 NOTE — Progress Notes (Signed)
?      ?                 PROGRESS NOTE ? ?      ?PATIENT DETAILS ?Name: Jeffrey Stephens ?Age: 81 y.o. ?Sex: male ?Date of Birth: 07-22-1941 ?Admit Date: 01/20/2022 ?Admitting Physician Jacky Kindle, MD ?PPJ:[id number], Jenny Reichmann, MD ? ?Brief Summary: ?Patient is a 81 y.o.  male with history of atrial flutter on Eliquis, VTE, lung cancer s/p lobectomy 2017, RA on methotrexate, COPD home O2 -recent hospitalization from 4/9-4/16 with right hip fracture following a mechanical fall-s/p ORIF-presented to the hospital with shortness of breath/cough-patient was found to have acute on chronic hypoxic/hypercapnic respiratory failure requiring intubation-due to rhinovirus PNA, COPD exacerbation and A-fib with RVR.  Patient was stabilized in the ICU-extubated on 4/29-and subsequently transferred to [facility] on 5/1. ? ?Significant events: ?4/9-4/16>> right hip fracture-s/p ORIF-discharged home with home health. ?4/28>> presented to ED with shortness of breath-intubated-severe hypoxemia due to rhinovirus-A-fib RVR s/p synchronized cardioversion in the ED ?4/28>> extubated ?5/01>> transfer to [facility] ? ?Significant studies: ?4/28>> interstitial thickening superimposed on obstructive lung disease-likely PNA versus CHF. ?4/28>> Echo: EF 60-65% ? ?Significant microbiology data: ?4/28>> COVID/influenza PCR: Negative ?4/28>> respiratory virus panel: Rhinovirus +ve ?4/28>> urine culture: No growth ?4/28>> blood culture: No growth ? ?Procedures: ?EGD: 4/28>> 4/29 ? ?Consults: ?PCCM,ortho ? ?Subjective: ?No major issues overnight-stable-awaiting word from CIR/insurance regarding authorization. ? ?Objective: ?Vitals: ?Blood pressure (!) 138/56, pulse 86, temperature 97.7 ?F (36.5 ?C), temperature source Oral, resp. rate 20, height 5\' 9"  (1.753 m), weight 108.9 kg, SpO2 96 %.  ? ?Exam: ?Gen Exam:Alert awake-not in any distress ?HEENT:atraumatic, normocephalic ?Chest: B/L clear to auscultation anteriorly ?CVS:S1S2 regular ?Abdomen:soft non tender, non  distended ?Extremities:no edema ?Neurology: Non focal ?Skin: no rash  ? ?Pertinent Labs/Radiology: ? ?  Latest Ref Rng & Units 01/24/2022  ?  5:10 AM 01/21/2022  ?  4:39 AM 01/21/2022  ?  2:23 AM  ?CBC  ?WBC 4.0 - 10.5 K/uL 11.4    9.1    ?Hemoglobin 13.0 - 17.0 g/dL 8.4   7.8   7.2    ?Hematocrit 39.0 - 52.0 % 26.9   23.0   24.6    ?Platelets 150 - 400 K/uL 375    356    ?  ?Lab Results  ?Component Value Date  ? NA 137 01/25/2022  ? K 4.0 01/25/2022  ? CL 100 01/25/2022  ? CO2 31 01/25/2022  ? ?  ? ? ?Assessment/Plan: ?Acute on chronic hypoxic/hypercarbic respiratory failure due to rhinovirus PNA COPD exacerbation: Significantly better-back on his usual home regimen of 4 L.  Continue tapering prednisone and bronchodilators.  ? ?Acute metabolic encephalopathy: Due to hypercarbia-resolved-currently awake and alert. ? ?Persistent atrial flutter: Remains in atrial flutter-rate controlled with Cardizem-on Eliquis.  ? ?Acute on chronic HFpEF: Volume status has improved dramatically-continue furosemide. ? ?History of DVT: Remains on Eliquis. ? ?History of AAA: Outpatient surveillance imaging. ? ?CKD stage IIIa: Creatinine close to baseline-monitor periodically. ? ?Normocytic anemia: Due to combination of acute illness-possible recent blood loss from hip fracture.  CBC with stable hemoglobin-follow periodically. ? ?History of lung cancer-s/p lobectomy 2017: Stable-follow-up with his outpatient physicians. ? ?RA: Methotrexate on hold ? ?History of recent right hip fracture requiring IM nailing: Ortho following-activity advanced touchdown weightbearing ? ?Debility/deconditioning: Evaluated by PT/OT-recommendations are for CIR. ? ?Nutrition Status: ?Nutrition Problem: Inadequate oral intake ?Etiology: inability to eat ?Signs/Symptoms: NPO status ?Interventions: Ensure Enlive (each supplement provides 350kcal and 20  grams of protein) ? ?Obesity: ?Estimated body mass index is 35.45 kg/m? as calculated from the following: ?  Height  as of this encounter: 5\' 9"  (1.753 m). ?  Weight as of this encounter: 108.9 kg.  ? ?Code status: ?  Code Status: Full Code  ? ?DVT Prophylaxis: ?apixaban (ELIQUIS) tablet 5 mg   ? ? ?Family Communication: Daughter-[name]-[phone]-and spouse updated at bedside on 5/3.   ? ? ?Disposition Plan: ?Status is: Inpatient ?Remains inpatient appropriate because: Medically stable for discharge-awaiting insurance authorization for CIR. ?  ?Planned Discharge Destination: CIR.  If CIR is declined by insurance-family does not want SNF-and will want to go home with home health. ? ? ?Diet: ?Diet Order   ? ?       ?  Diet regular Room service appropriate? Yes; Fluid consistency: Thin  Diet effective now       ?  ? ?  ?  ? ?  ?  ? ? ?Antimicrobial agents: ?Anti-infectives (From admission, onward)  ? ? Start     Dose/Rate Route Frequency Ordered Stop  ? 01/23/22 0600  Ampicillin-Sulbactam (UNASYN) 3 g in sodium chloride 0.9 % 100 mL IVPB  Status:  Discontinued       ? 3 g ?200 mL/hr over 30 Minutes Intravenous Every 6 hours 01/23/22 0519 01/23/22 0520  ? 01/21/22 1000  vancomycin (VANCOREADY) IVPB 1250 mg/250 mL  Status:  Discontinued       ? 1,250 mg ?166.7 mL/hr over 90 Minutes Intravenous Every 24 hours 01/20/22 0822 01/21/22 1013  ? 01/20/22 1700  Ampicillin-Sulbactam (UNASYN) 3 g in sodium chloride 0.9 % 100 mL IVPB  Status:  Discontinued       ? 3 g ?200 mL/hr over 30 Minutes Intravenous Every 6 hours 01/20/22 0917 01/21/22 1124  ? 01/20/22 0830  vancomycin (VANCOREADY) IVPB 1750 mg/350 mL       ? 1,750 mg ?175 mL/hr over 120 Minutes Intravenous  Once 01/20/22 0822 01/20/22 1231  ? 01/20/22 0815  piperacillin-tazobactam (ZOSYN) IVPB 3.375 g       ? 3.375 g ?100 mL/hr over 30 Minutes Intravenous  Once 01/20/22 6195 01/20/22 0942  ? ?  ? ? ? ?MEDICATIONS: ?Scheduled Meds: ? apixaban  5 mg Oral BID  ? arformoterol  15 mcg Nebulization BID  ? budesonide (PULMICORT) nebulizer solution  0.5 mg Nebulization BID  ? Chlorhexidine  Gluconate Cloth  6 each Topical Daily  ? diltiazem  30 mg Oral Q6H  ? feeding supplement  237 mL Oral TID BM  ? furosemide  40 mg Oral Daily  ? insulin aspart  0-15 Units Subcutaneous TID WC  ? insulin aspart  0-5 Units Subcutaneous QHS  ? lactulose  20 g Oral Daily  ? mouth rinse  15 mL Mouth Rinse BID  ? pantoprazole  40 mg Oral QHS  ? predniSONE  20 mg Oral Q breakfast  ? senna  1 tablet Oral BID  ? umeclidinium bromide  1 puff Inhalation Daily  ? ?Continuous Infusions: ?PRN Meds:.albuterol, docusate sodium, lip balm, ondansetron (ZOFRAN) IV, oxyCODONE, polyethylene glycol ? ? ?I have personally reviewed following labs and imaging studies ? ?LABORATORY DATA: ?CBC: ?Recent Labs  ?Lab 01/20/22 ?0932 01/20/22 ?0824 01/20/22 ?1032 01/21/22 ?0223 01/21/22 ?0439 01/24/22 ?0510  ?WBC 7.9  --   --  9.1  --  11.4*  ?NEUTROABS 6.9  --   --   --   --   --   ?HGB 8.6* 9.9* 9.9* 7.2*  7.8* 8.4*  ?HCT 29.5* 29.0* 29.0* 24.6* 23.0* 26.9*  ?MCV 100.3*  --   --  96.5  --  94.4  ?PLT 499*  --   --  356  --  375  ? ? ? ?Basic Metabolic Panel: ?Recent Labs  ?Lab 01/20/22 ?1229 01/20/22 ?1556 01/21/22 ?0223 01/21/22 ?0439 01/21/22 ?1436 01/22/22 ?0256 01/23/22 ?0104 01/24/22 ?0510 01/25/22 ?0436  ?NA  --   --  137   < > 140 139 141 138 137  ?K  --   --  4.8   < > 4.6 3.8 4.0 3.9 4.0  ?CL  --   --  104  --  104 103 101 100 100  ?CO2  --   --  27  --  26 30 32 32 31  ?GLUCOSE  --   --  193*  --  115* 125* 120* 114* 124*  ?BUN  --   --  39*  --  39* 35* 35* 26* 21  ?CREATININE  --   --  1.28*  --  1.12 1.07 1.04 0.96 0.83  ?CALCIUM  --   --  8.8*  --  8.8* 8.4* 8.7* 8.9 8.6*  ?MG 2.2 2.2 2.2  --   --   --   --   --   --   ?PHOS  --  3.8 2.6  --   --   --   --   --   --   ? < > = values in this interval not displayed.  ? ? ? ?GFR: ?Estimated Creatinine Clearance: 87.8 mL/min (by C-G formula based on SCr of 0.83 mg/dL). ? ?Liver Function Tests: ?Recent Labs  ?Lab 01/20/22 ?0539 01/22/22 ?0256 01/23/22 ?0104  ?AST 34 23 71*  ?ALT 21 19 43   ?ALKPHOS 76 73 41  ?BILITOT 1.8* 1.2 1.3*  ?PROT 6.4* 5.3* 5.5*  ?ALBUMIN 2.8* 2.0* 2.2*  ? ? ?No results for input(s): LIPASE, AMYLASE in the last 168 hours. ?No results for input(s): AMMONIA in the last 168 h

## 2022-01-26 DIAGNOSIS — G9341 Metabolic encephalopathy: Secondary | ICD-10-CM | POA: Diagnosis not present

## 2022-01-26 DIAGNOSIS — C3430 Malignant neoplasm of lower lobe, unspecified bronchus or lung: Secondary | ICD-10-CM | POA: Diagnosis not present

## 2022-01-26 DIAGNOSIS — J9621 Acute and chronic respiratory failure with hypoxia: Secondary | ICD-10-CM | POA: Diagnosis not present

## 2022-01-26 LAB — GLUCOSE, CAPILLARY
Glucose-Capillary: 103 mg/dL — ABNORMAL HIGH (ref 70–99)
Glucose-Capillary: 122 mg/dL — ABNORMAL HIGH (ref 70–99)
Glucose-Capillary: 123 mg/dL — ABNORMAL HIGH (ref 70–99)
Glucose-Capillary: 139 mg/dL — ABNORMAL HIGH (ref 70–99)

## 2022-01-26 NOTE — Progress Notes (Signed)
Nutrition Follow-up ? ?DOCUMENTATION CODES:  ? ?Not applicable ? ?INTERVENTION:  ?Continue Ensure Enlive po TID, each supplement provides 350 kcal and 20 grams of protein. ?  ?Encourage adequate PO intake ? ?NUTRITION DIAGNOSIS:  ? ?Inadequate oral intake related to inability to eat as evidenced by NPO status; diet advanced; improved ? ?GOAL:  ? ?Patient will meet greater than or equal to 90% of their needs; met ? ?MONITOR:  ? ?PO intake, Supplement acceptance, Labs, Weight trends, Skin, I & O's ? ?REASON FOR ASSESSMENT:  ? ?Ventilator, Consult ?Enteral/tube feeding initiation and management ? ?ASSESSMENT:  ? ?81 year old male who presented to the ED on 4/28 with SOB and Afib RVR. Pt required intubation in the ED. PMH of recent hip fx s/p surgery, atrial flutter, prior DVT, AAA, lung cancer, COPD. Pt admitted with acute on chronic respiratory failure, CAP. Extubated 4/29. ? ?Meal completion has been 100%. Pt currently has Ensure ordered with varied consumption. RD to continue with current orders to aid in caloric and protein needs. Possible plans for CIR at discharge. Labs and medications reviewed.  ? ?Diet Order:   ?Diet Order   ? ?       ?  Diet regular Room service appropriate? Yes; Fluid consistency: Thin  Diet effective now       ?  ? ?  ?  ? ?  ? ? ?EDUCATION NEEDS:  ? ?Not appropriate for education at this time ? ?Skin:  Skin Assessment: Skin Integrity Issues: ?Skin Integrity Issues:: Incisions ?Incisions: R thigh, leg ? ?Last BM:  5/4 ? ?Height:  ? ?Ht Readings from Last 1 Encounters:  ?01/22/22 '5\' 9"'  (1.753 m)  ? ? ?Weight:  ? ?Wt Readings from Last 1 Encounters:  ?01/26/22 107.4 kg  ? ?BMI:  Body mass index is 34.97 kg/m?. ? ?Estimated Nutritional Needs:  ? ?Kcal:  1900-2100 ? ?Protein:  110-130 grams ? ?Fluid:  1.9-2.1 L ? ?Corrin Parker, MS, RD, LDN ?RD pager number/after hours weekend pager number on Amion. ? ?

## 2022-01-26 NOTE — Progress Notes (Signed)
Physical Therapy Treatment ?Patient Details ?Name: Jeffrey Stephens ?MRN: [id number] ?DOB: September 16, 1941 ?Today's Date: 01/26/2022 ? ? ?History of Present Illness 81 yo male admitted 4/28 with SOB, hypoxic with Afib with RVR requiring cardioversion and intubation; 4/9 fall at home with Rt hip fx s/p Im nail 4/11, TDWB  PMhx: AAA, basal cell CA, urinary frequency, GERD, DVT LLE, cerebral aneurysm, kidney stones, lung CA, PAF, RA, stroke, TIA, depression. ? ?  ?PT Comments  ? ? The pt was agreeable to session with focus on strengthening and seated balance. He was able to complete a series of LE exercises against min-mod resistance and UE exercises with small objects for weights in room with good technique. He did require 1-2 min seated rest between exercises due to SOB and fatigue, but is highly motivated to continue working after reasonable rest. The pt was educated on things he can do outside of session for improved core strength and movement as well, and was able to scoot along EOB with minA and good ability to maintain TDWB RLE. Continue to recommend AIR at d/c to maximize functional recovery and capacity for transfers as the pt has great family assist available at home.  ?  ?Recommendations for follow up therapy are one component of a multi-disciplinary discharge planning process, led by the attending physician.  Recommendations may be updated based on patient status, additional functional criteria and insurance authorization. ? ?Follow Up Recommendations ? Acute inpatient rehab (3hours/day) ?  ?  ?Assistance Recommended at Discharge Frequent or constant Supervision/Assistance  ?Patient can return home with the following Two people to help with walking and/or transfers;Two people to help with bathing/dressing/bathroom;Assistance with cooking/housework;Direct supervision/assist for medications management;Direct supervision/assist for financial management;Assist for transportation;Help with stairs or ramp for entrance ?   ?Equipment Recommendations ? Other (comment) (drop arm BSC would be ideal (may need to check if theirs has drop arm), medical van for transport home (per PT eval pending progress))  ?  ?Recommendations for Other Services   ? ? ?  ?Precautions / Restrictions Precautions ?Precautions: Fall ?Precaution Comments: watch O2 sats, goal 92% ?Restrictions ?Weight Bearing Restrictions: Yes ?RLE Weight Bearing: Touchdown weight bearing ?Other Position/Activity Restrictions: TDWB for transfers only  ?  ? ?Mobility ? Bed Mobility ?Overal bed mobility: Needs Assistance ?Bed Mobility: Supine to Sit, Sit to Supine ?  ?  ?Supine to sit: Mod assist ?Sit to supine: Mod assist ?  ?General bed mobility comments: modA to complete LE movements and elevate trunk from elevated HOB. modA to complete scooting ?  ? ?Transfers ?Overall transfer level: Needs assistance ?  ?Transfers: Bed to chair/wheelchair/BSC ?  ?  ?  ?  ?  ? Lateral/Scoot Transfers: Min guard ?General transfer comment: minG along EOB with good maintenance of TDWB ?  ? ? ? ?  ?Balance Overall balance assessment: Needs assistance, History of Falls ?Sitting-balance support: Feet supported, No upper extremity supported ?Sitting balance-Leahy Scale: Fair ?Sitting balance - Comments: supervision for seated balance, at times reaching for UE support ?  ?  ?  ?  ?  ?  ?  ?  ?  ?  ?  ?  ?  ?  ?  ?  ? ?  ?Cognition Arousal/Alertness: Awake/alert ?Behavior During Therapy: [facility] for tasks assessed/performed ?Overall Cognitive Status: History of cognitive impairments - at baseline ?  ?  ?  ?  ?  ?  ?  ?  ?  ?  ?  ?  ?  ?  ?  ?  ?  General Comments: has had a stroke and aneurysm, mult cognitive insults.pt able to follow all cues and instructions without repeated instructions at this time. cues for WB repeated with transfers ?  ?  ? ?  ?Exercises General Exercises - Upper Extremity ?Shoulder Flexion: Strengthening, Both, 10 reps, Seated (with ensure as weight) ?Shoulder ABduction:  Strengthening, Both, 10 reps, Seated (with ensure as weight) ?Elbow Flexion: Strengthening, Both, 10 reps, Seated (with ensure as weight) ?General Exercises - Lower Extremity ?Long Arc Quad: AROM, Both, 20 reps, Seated (x10 AROM RLE, X5AROM, X5 against min-mod resistance with LLE) ?Heel Slides: AROM, Both, 20 reps, Seated (x10 AROM RLE, X5AROM, X5 against min-mod resistance with LLE) ?Hip Flexion/Marching: AROM, Left, 20 reps, Seated ?Toe Raises: AROM, Right, 10 reps, Seated ?Heel Raises: AROM, Left, 20 reps, Seated ? ?  ?General Comments General comments (skin integrity, edema, etc.): VSS on 3L, SpO2 to low of 90%, HR to max 119bpm ?  ?  ? ?Pertinent Vitals/Pain Pain Assessment ?Pain Assessment: No/denies pain ?Pain Intervention(s): Monitored during session  ? ? ? ?PT Goals (current goals can now be found in the care plan section) Acute Rehab PT Goals ?Patient Stated Goal: To get OOB and get some rehab so I can get stronger then go home ?PT Goal Formulation: With patient/family ?Time For Goal Achievement: 02/05/22 ?Potential to Achieve Goals: Fair ?Progress towards PT goals: Progressing toward goals ? ?  ?Frequency ? ? ? Min 3X/week ? ? ? ?  ?PT Plan Current plan remains appropriate  ? ? ?   ?AM-PAC PT "6 Clicks" Mobility   ?Outcome Measure ? Help needed turning from your back to your side while in a flat bed without using bedrails?: A Little ?Help needed moving from lying on your back to sitting on the side of a flat bed without using bedrails?: A Lot ?Help needed moving to and from a bed to a chair (including a wheelchair)?: A Lot ?Help needed standing up from a chair using your arms (e.g., wheelchair or bedside chair)?: Total ?Help needed to walk in hospital room?: Total ?Help needed climbing 3-5 steps with a railing? : Total ?6 Click Score: 10 ? ?  ?End of Session Equipment Utilized During Treatment: Oxygen ?Activity Tolerance: Patient tolerated treatment well ?Patient left: in bed;with call bell/phone within  reach;with bed alarm set;with family/visitor present ?Nurse Communication: Mobility status ?PT Visit Diagnosis: Muscle weakness (generalized) (M62.81);Other abnormalities of gait and mobility (R26.89);Pain;Dizziness and giddiness (R42);History of falling (Z91.81);Other (comment) ?Pain - Right/Left: Right ?Pain - part of body: Hip;Knee ?  ? ? ?Time: 7544-9201 ?PT Time Calculation (min) (ACUTE ONLY): 30 min ? ?Charges:  $Therapeutic Exercise: 23-37 mins          ?          ? ?West Carbo, PT, DPT  ? ?Acute Rehabilitation Department ?Pager #: [phone] ? ? ?Sandra Cockayne ?01/26/2022, 6:07 PM ? ?

## 2022-01-26 NOTE — Plan of Care (Signed)
°  Problem: Education: °Goal: Knowledge of disease or condition will improve °Outcome: Progressing °Goal: Knowledge of the prescribed therapeutic regimen will improve °Outcome: Progressing °Goal: Individualized Educational Video(s) °Outcome: Progressing °  °

## 2022-01-26 NOTE — Progress Notes (Signed)
Inpatient Rehabilitation Admissions Coordinator  ? ?I await approval from [facility] for possible Cir admit. ? ?Danne Baxter, RN, MSN ?Rehab Admissions Coordinator ?[phone] ?01/26/2022 10:18 AM ? ?

## 2022-01-26 NOTE — H&P (Incomplete)
? ? ?Physical Medicine and Rehabilitation Admission H&P ? ?  ?CC: debility secondary to acute on chronic hypoxic/hypercapnic respiratory  failure ? ?HPI: Jeffrey Stephens is a 81 year old male who presented to [facility] ED via EMS on 01/20/2022 complaining of shortness of breath and oxygen desaturation to 68%.  He displayed agonal breathing, clipped speech and intermittent confusion with increased somnolence.  He required intubation and mechanical ventilation.  Cardiac rhythm atrial flutter and was converted with 200 J of synchronized cardioversion.  Empiric antibiotics started.  Critical care medicine consulted.  Viral testing significant for rhinovirus.  Extubated on 4/29.  Transferred to hospitalist service on 4/30. Tolerating regular diet. In atrial flutter with controlled rate on Cardizem. Pulmonar status at baseline on prednisone taper and bronchodilators. On Lasix for history of heart failure with EF of 60-65%. ? ?Medical history significant for recent right hip fracture secondary to a fall on 4/11 and is status post IM nail by Dr. Marlou Sa.  He has seen the patient in follow-up and follow-up x-rays obtained.  He is cleared for touchdown weightbearing only. ? 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 ?COPD>>chronic exertional SOB, daily chronic productive cough ?Saw Baltazar Apo, MD [facility] 09/20/2021. CT chest 02/2021>.stable small pulmonary nodules c/w benign process ? ?Former smoker (6 pyh) ? ?ROS ?Past Medical History:  ?Diagnosis Date  ? AAA (abdominal aortic aneurysm) ([place])   ? a. followed by Dr. Deitra Mayo - 01/2016 CT: 3.6 cm infrarenal AAA.  ? Abnormal nuclear cardiac imaging test 07/13/2014  ? a. 06/2014 MV: possible reversible inferior wall defect,no wma, nl EF, felt to be artifact.  ? Basal cell carcinoma   ? "LLE; some on my head"  ? BPH (benign prostatic hyperplasia)   ? Cerebral aneurysm 1994  ? Constipation   ? Depression   ? DVT (deep venous thrombosis)  ([place]) 2001  ? Hx of Left leg   ? Dysrhythmia   ? AFIB   ? Frequency of urination   ? GERD (gastroesophageal reflux disease)   ? History of echocardiogram   ? a. 2D ECHO: 06/11/2014: EF 55-60%. Normal wall thickness. Indeterminate DD ( atrial flutter ). No RWMA. Mild LA dilation. Normal RV size and systolic function. No significant valvular abnormalities.  ? History of palpitations   ? OCCASIONAL  ? Kidney stones   ? Lung nodule   ? a. 02/2016 PET scan: intensely hypermetabolic 2.0 x 1.5 cm central RLL nodule (SUV 23).  ? Paroxysmal atrial flutter ([place])   ? a. On eliquis and sotalol (CHA2DS2VASc = 1).  ? Rheumatoid arthritis ([place])   ? "qwhere"  ? Stroke [facility]) 1994  ? "when I had ruptured aneurysm in my head"  ? TIA (transient ischemic attack)   ? [place]  ? ?Past Surgical History:  ?Procedure Laterality Date  ? BASAL CELL CARCINOMA EXCISION    ? "LLE; top of my head"  ? CEREBRAL ANEURYSM REPAIR  1994  ? Hx of ruptured brain aneurysm Tx by Dr. Ellene Route  ? CYSTOSCOPY W/ STONE MANIPULATION  1980's X 1  ? EXCISIONAL HEMORRHOIDECTOMY  1970's  ? INTRAMEDULLARY (IM) NAIL INTERTROCHANTERIC Right 01/03/2022  ? Procedure: INTRAMEDULLARY (IM) NAIL INTERTROCHANTRIC; ASPIRATION OF RIGHT KNEE EFFUSION AND INJECTION.;  Surgeon: Meredith Pel, MD;  Location: [place];  Service: Orthopedics;  Laterality: Right;  ? JOINT REPLACEMENT    ? LT KNEE  ? TOTAL KNEE ARTHROPLASTY Left 05/14/2014  ? Procedure: LEFT TOTAL KNEE ARTHROPLASTY;  Surgeon: Doroteo Bradford  Tonita Cong, MD;  Location: [facility];  Service: Orthopedics;  Laterality: Left;  ? VIDEO ASSISTED THORACOSCOPY (VATS)/WEDGE RESECTION Right 05/08/2016  ? Procedure: VIDEO ASSISTED THORACOSCOPY (VATS)/LUNG RESECTION;  Surgeon: Ivin Poot, MD;  Location: [place];  Service: Thoracic;  Laterality: Right;  ? ?Family History  ?Problem Relation Age of Onset  ? Heart disease Father   ?     Aneurysm  ? Diabetes Father   ? Heart attack Father   ? Heart disease Brother   ?     Heart Disease  before age 91  ? Cancer Brother   ? Hypertension Neg Hx   ? Stroke Neg Hx   ? ?Social History:  reports that he quit smoking about 29 years ago. His smoking use included cigarettes. He has a 92.50 pack-year smoking history. He has never used smokeless tobacco. He reports that he does not drink alcohol and does not use drugs. ?Allergies: No Known Allergies ?Medications Prior to Admission  ?Medication Sig Dispense Refill  ? acetaminophen (TYLENOL) 500 MG tablet Take 1,000 mg by mouth every 6 (six) hours as needed for moderate pain.    ? albuterol (VENTOLIN HFA) 108 (90 Base) MCG/ACT inhaler TAKE 2 PUFFS BY MOUTH EVERY 6 HOURS AS NEEDED FOR WHEEZE OR SHORTNESS OF BREATH (Patient taking differently: Inhale 2 puffs into the lungs every 6 (six) hours as needed for shortness of breath or wheezing.) 8.5 g 6  ? Cholecalciferol (VITAMIN D3 PO) Take 2,000 mcg by mouth in the morning and at bedtime.    ? diltiazem (CARDIZEM CD) 180 MG 24 hr capsule Take 1 capsule (180 mg total) by mouth daily. 30 capsule 2  ? ELIQUIS 5 MG TABS tablet TAKE 1 TABLET BY MOUTH TWICE A DAY (Patient taking differently: Take 5 mg by mouth 2 (two) times daily.) 180 tablet 1  ? Fluticasone Propionate (FLONASE ALLERGY RELIEF NA) Place 1 spray into the nose daily as needed (allergy relief).    ? folic acid (FOLVITE) 1 MG tablet Take 1 mg by mouth every morning.     ? guaiFENesin-dextromethorphan (ROBITUSSIN DM) 100-10 MG/5ML syrup Take 10 mLs by mouth every 4 (four) hours as needed for cough.    ? lactulose (CHRONULAC) 10 GM/15ML solution Take 30 mLs (20 g total) by mouth 2 (two) times daily. (Patient taking differently: Take 20 g by mouth daily.) 946 mL 0  ? lansoprazole (PREVACID) 30 MG capsule Take 30 mg by mouth every morning.    ? methocarbamol (ROBAXIN) 500 MG tablet Take 1 tablet (500 mg total) by mouth every 6 (six) hours as needed for muscle spasms. 30 tablet 0  ? methotrexate (RHEUMATREX) 5 MG tablet Take 10 mg by mouth every Monday. Caution:  Chemotherapy. Protect from light.    ? Multiple Vitamins-Minerals (CENTRUM SILVER ADULT 50+) TABS Take 50 mg by mouth daily.    ? oxyCODONE (OXY IR/ROXICODONE) 5 MG immediate release tablet Take 1 tablet (5 mg total) by mouth every 4 (four) hours as needed for severe pain or moderate pain. 30 tablet 0  ? senna-docusate (SENOKOT-S) 8.6-50 MG tablet Take 1 tablet by mouth 2 (two) times daily. 60 tablet 0  ? Tiotropium Bromide-Olodaterol 2.5-2.5 MCG/ACT AERS Inhale 2 puffs into the lungs daily. (Patient not taking: Reported on 01/21/2022) 4 g 11  ? ? ? ? ?Home: ?Home Living ?Family/patient expects to be discharged to:: Private residence ?Living Arrangements: Spouse/significant other, Children (daughter, [name], in the home) ?Available Help at Discharge: Family, Available 24 hours/day ?  Type of Home: House ?Home Access: Stairs to enter, Ramped entrance ?Entrance Stairs-Number of Steps: 4 ?Entrance Stairs-Rails: Right, Left ?Home Layout: One level ?Bathroom Shower/Tub: Tub/shower unit ?Bathroom Toilet: Standard ?Bathroom Accessibility: Yes ?Home Equipment: Conservation officer, nature (2 wheels), BSC/3in1, Wheelchair - manual ?Additional Comments: Sleeps in recliner at home ? Lives With: Spouse, Daughter ?  ?Functional History: ?Prior Function ?Prior Level of Function : Needs assist ?Physical Assist : Mobility (physical) ?Mobility (physical): Gait ?Mobility Comments: gait on rollator at home pta --now only stand pivots, falls; after recent fall with hip fx, surgical fixation, TDWB, used a wheelchair for mobility ?ADLs Comments: Since recent hip fx, has used a urinal ? ?Functional Status:  ?Mobility: ?Bed Mobility ?Overal bed mobility: Needs Assistance ?Bed Mobility: Supine to Sit, Sit to Supine, Rolling ?Rolling: Min assist ?Sidelying to sit: Mod assist, HOB elevated ?Supine to sit: Mod assist, +2 for physical assistance (HOB ~30 deg) ?Sit to supine: Mod assist, +2 for physical assistance ?General bed mobility comments: cues for improved  technique and pt able to utilize bed rail with RUE but needs modA with LUE for trunk rise and BLE assist to reach EOB then also to bed BLE back over side of bed. Pt able to assist with posterior supine scooting

## 2022-01-26 NOTE — Progress Notes (Addendum)
?      ?                 PROGRESS NOTE ? ?      ?PATIENT DETAILS ?Name: Jeffrey Stephens ?Age: 81 y.o. ?Sex: male ?Date of Birth: 03/24/1941 ?Admit Date: 01/20/2022 ?Admitting Physician Jacky Kindle, MD ?NWG:[id number], Jenny Reichmann, MD ? ?Brief Summary: ?Patient is a 81 y.o.  male with history of atrial flutter on Eliquis, VTE, lung cancer s/p lobectomy 2017, RA on methotrexate, COPD home O2 -recent hospitalization from 4/9-4/16 with right hip fracture following a mechanical fall-s/p ORIF-presented to the hospital with shortness of breath/cough-patient was found to have acute on chronic hypoxic/hypercapnic respiratory failure requiring intubation-due to rhinovirus PNA, COPD exacerbation and A-fib with RVR.  Patient was stabilized in the ICU-extubated on 4/29-and subsequently transferred to [facility] on 5/1. ? ?Significant events: ?4/9-4/16>> right hip fracture-s/p ORIF-discharged home with home health. ?4/28>> presented to ED with shortness of breath-intubated-severe hypoxemia due to rhinovirus-A-fib RVR s/p synchronized cardioversion in the ED ?4/28>> extubated ?5/01>> transfer to [facility] ? ?Significant studies: ?4/28>> interstitial thickening superimposed on obstructive lung disease-likely PNA versus CHF. ?4/28>> Echo: EF 60-65% ? ?Significant microbiology data: ?4/28>> COVID/influenza PCR: Negative ?4/28>> respiratory virus panel: Rhinovirus +ve ?4/28>> urine culture: No growth ?4/28>> blood culture: No growth ? ?Procedures: ?EGD: 4/28>> 4/29 ? ?Consults: ?PCCM,ortho ? ?Subjective: ?Lying comfortably in bed-stable on his usual regimen of 4 L.  Frustrated that he is still in the hospital.  Awaiting insurance authorization for CIR. ? ?Objective: ?Vitals: ?Blood pressure (!) 124/56, pulse 86, temperature 98.1 ?F (36.7 ?C), temperature source Oral, resp. rate (!) 22, height 5\' 9"  (1.753 m), weight 107.4 kg, SpO2 96 %.  ? ?Exam: ?Gen Exam:Alert awake-not in any distress ?HEENT:atraumatic, normocephalic ?Chest: B/L clear to auscultation  anteriorly ?CVS:S1S2 regular ?Abdomen:soft non tender, non distended ?Extremities:no edema ?Neurology: Non focal ?Skin: no rash  ? ?Pertinent Labs/Radiology: ? ?  Latest Ref Rng & Units 01/24/2022  ?  5:10 AM 01/21/2022  ?  4:39 AM 01/21/2022  ?  2:23 AM  ?CBC  ?WBC 4.0 - 10.5 K/uL 11.4    9.1    ?Hemoglobin 13.0 - 17.0 g/dL 8.4   7.8   7.2    ?Hematocrit 39.0 - 52.0 % 26.9   23.0   24.6    ?Platelets 150 - 400 K/uL 375    356    ?  ?Lab Results  ?Component Value Date  ? NA 137 01/25/2022  ? K 4.0 01/25/2022  ? CL 100 01/25/2022  ? CO2 31 01/25/2022  ? ?  ? ? ?Assessment/Plan: ?Acute on chronic hypoxic/hypercarbic respiratory failure due to rhinovirus PNA COPD exacerbation: Significantly better-back on his usual home regimen of 4 L.  Continue tapering prednisone and bronchodilators.  ? ?Acute metabolic encephalopathy: Due to hypercarbia-resolved-currently awake and alert. ? ?Persistent atrial flutter: Remains in and out of atrial flutter-rate controlled with Cardizem-on Eliquis.  ? ?Acute on chronic HFpEF: Volume status has improved dramatically-continue furosemide. ? ?History of DVT: Remains on Eliquis. ? ?History of AAA: Outpatient surveillance imaging. ? ?CKD stage IIIa: Creatinine close to baseline-monitor periodically. ? ?Normocytic anemia: Due to combination of acute illness-possible recent blood loss from hip fracture.  CBC with stable hemoglobin-follow periodically. ? ?History of lung cancer-s/p lobectomy 2017: Stable-follow-up with his outpatient physicians. ? ?RA: Methotrexate on hold ? ?History of recent right hip fracture requiring IM nailing: Ortho following-activity advanced touchdown weightbearing ? ?Debility/deconditioning: Evaluated by PT/OT-recommendations are for CIR. ? ?Nutrition Status: ?  Nutrition Problem: Inadequate oral intake ?Etiology: inability to eat ?Signs/Symptoms: NPO status ?Interventions: Ensure Enlive (each supplement provides 350kcal and 20 grams of protein) ? ?Obesity: ?Estimated body  mass index is 34.97 kg/m? as calculated from the following: ?  Height as of this encounter: 5\' 9"  (1.753 m). ?  Weight as of this encounter: 107.4 kg.  ? ?Code status: ?  Code Status: Full Code  ? ?DVT Prophylaxis: ?apixaban (ELIQUIS) tablet 5 mg   ? ? ?Family Communication: Daughter-[name]-and spouse updated at bedside on 5/3.   ? ? ?Disposition Plan: ?Status is: Inpatient ?Remains inpatient appropriate because: Medically stable for discharge-awaiting insurance authorization for CIR. ?  ?Planned Discharge Destination: CIR.  If CIR is declined by insurance-family does not want SNF-and will want to go home with home health. ? ? ?Diet: ?Diet Order   ? ?       ?  Diet regular Room service appropriate? Yes; Fluid consistency: Thin  Diet effective now       ?  ? ?  ?  ? ?  ?  ? ? ?Antimicrobial agents: ?Anti-infectives (From admission, onward)  ? ? Start     Dose/Rate Route Frequency Ordered Stop  ? 01/23/22 0600  Ampicillin-Sulbactam (UNASYN) 3 g in sodium chloride 0.9 % 100 mL IVPB  Status:  Discontinued       ? 3 g ?200 mL/hr over 30 Minutes Intravenous Every 6 hours 01/23/22 0519 01/23/22 0520  ? 01/21/22 1000  vancomycin (VANCOREADY) IVPB 1250 mg/250 mL  Status:  Discontinued       ? 1,250 mg ?166.7 mL/hr over 90 Minutes Intravenous Every 24 hours 01/20/22 0822 01/21/22 1013  ? 01/20/22 1700  Ampicillin-Sulbactam (UNASYN) 3 g in sodium chloride 0.9 % 100 mL IVPB  Status:  Discontinued       ? 3 g ?200 mL/hr over 30 Minutes Intravenous Every 6 hours 01/20/22 0917 01/21/22 1124  ? 01/20/22 0830  vancomycin (VANCOREADY) IVPB 1750 mg/350 mL       ? 1,750 mg ?175 mL/hr over 120 Minutes Intravenous  Once 01/20/22 0822 01/20/22 1231  ? 01/20/22 0815  piperacillin-tazobactam (ZOSYN) IVPB 3.375 g       ? 3.375 g ?100 mL/hr over 30 Minutes Intravenous  Once 01/20/22 9371 01/20/22 0942  ? ?  ? ? ? ?MEDICATIONS: ?Scheduled Meds: ? apixaban  5 mg Oral BID  ? arformoterol  15 mcg Nebulization BID  ? budesonide  (PULMICORT) nebulizer solution  0.5 mg Nebulization BID  ? Chlorhexidine Gluconate Cloth  6 each Topical Daily  ? diltiazem  30 mg Oral Q6H  ? feeding supplement  237 mL Oral TID BM  ? furosemide  40 mg Oral Daily  ? insulin aspart  0-15 Units Subcutaneous TID WC  ? insulin aspart  0-5 Units Subcutaneous QHS  ? lactulose  20 g Oral Daily  ? mouth rinse  15 mL Mouth Rinse BID  ? pantoprazole  40 mg Oral QHS  ? predniSONE  20 mg Oral Q breakfast  ? senna  1 tablet Oral BID  ? umeclidinium bromide  1 puff Inhalation Daily  ? ?Continuous Infusions: ?PRN Meds:.albuterol, docusate sodium, lip balm, ondansetron (ZOFRAN) IV, oxyCODONE, polyethylene glycol ? ? ?I have personally reviewed following labs and imaging studies ? ?LABORATORY DATA: ?CBC: ?Recent Labs  ?Lab 01/20/22 ?6967 01/20/22 ?0824 01/20/22 ?1032 01/21/22 ?0223 01/21/22 ?0439 01/24/22 ?0510  ?WBC 7.9  --   --  9.1  --  11.4*  ?NEUTROABS 6.9  --   --   --   --   --   ?  HGB 8.6* 9.9* 9.9* 7.2* 7.8* 8.4*  ?HCT 29.5* 29.0* 29.0* 24.6* 23.0* 26.9*  ?MCV 100.3*  --   --  96.5  --  94.4  ?PLT 499*  --   --  356  --  375  ? ? ? ?Basic Metabolic Panel: ?Recent Labs  ?Lab 01/20/22 ?1229 01/20/22 ?1556 01/21/22 ?0223 01/21/22 ?0439 01/21/22 ?1436 01/22/22 ?0256 01/23/22 ?0104 01/24/22 ?0510 01/25/22 ?0436  ?NA  --   --  137   < > 140 139 141 138 137  ?K  --   --  4.8   < > 4.6 3.8 4.0 3.9 4.0  ?CL  --   --  104  --  104 103 101 100 100  ?CO2  --   --  27  --  26 30 32 32 31  ?GLUCOSE  --   --  193*  --  115* 125* 120* 114* 124*  ?BUN  --   --  39*  --  39* 35* 35* 26* 21  ?CREATININE  --   --  1.28*  --  1.12 1.07 1.04 0.96 0.83  ?CALCIUM  --   --  8.8*  --  8.8* 8.4* 8.7* 8.9 8.6*  ?MG 2.2 2.2 2.2  --   --   --   --   --   --   ?PHOS  --  3.8 2.6  --   --   --   --   --   --   ? < > = values in this interval not displayed.  ? ? ? ?GFR: ?Estimated Creatinine Clearance: 87.2 mL/min (by C-G formula based on SCr of 0.83 mg/dL). ? ?Liver Function Tests: ?Recent Labs  ?Lab  01/20/22 ?1749 01/22/22 ?0256 01/23/22 ?0104  ?AST 34 23 71*  ?ALT 21 19 43  ?ALKPHOS 44 96 75  ?BILITOT 1.8* 1.2 1.3*  ?PROT 6.4* 5.3* 5.5*  ?ALBUMIN 2.8* 2.0* 2.2*  ? ? ?No results for input(s): LIPASE, AMYLASE

## 2022-01-27 DIAGNOSIS — J9621 Acute and chronic respiratory failure with hypoxia: Secondary | ICD-10-CM | POA: Diagnosis not present

## 2022-01-27 DIAGNOSIS — J810 Acute pulmonary edema: Secondary | ICD-10-CM

## 2022-01-27 DIAGNOSIS — I4892 Unspecified atrial flutter: Secondary | ICD-10-CM | POA: Diagnosis not present

## 2022-01-27 DIAGNOSIS — G9341 Metabolic encephalopathy: Secondary | ICD-10-CM | POA: Diagnosis not present

## 2022-01-27 LAB — CBC
HCT: 29 % — ABNORMAL LOW (ref 39.0–52.0)
Hemoglobin: 8.4 g/dL — ABNORMAL LOW (ref 13.0–17.0)
MCH: 27.7 pg (ref 26.0–34.0)
MCHC: 29 g/dL — ABNORMAL LOW (ref 30.0–36.0)
MCV: 95.7 fL (ref 80.0–100.0)
Platelets: 344 10*3/uL (ref 150–400)
RBC: 3.03 MIL/uL — ABNORMAL LOW (ref 4.22–5.81)
RDW: 20.1 % — ABNORMAL HIGH (ref 11.5–15.5)
WBC: 15.7 10*3/uL — ABNORMAL HIGH (ref 4.0–10.5)
nRBC: 0.2 % (ref 0.0–0.2)

## 2022-01-27 LAB — BASIC METABOLIC PANEL
Anion gap: 6 (ref 5–15)
BUN: 21 mg/dL (ref 8–23)
CO2: 33 mmol/L — ABNORMAL HIGH (ref 22–32)
Calcium: 8.8 mg/dL — ABNORMAL LOW (ref 8.9–10.3)
Chloride: 99 mmol/L (ref 98–111)
Creatinine, Ser: 0.99 mg/dL (ref 0.61–1.24)
GFR, Estimated: >60 mL/min (ref >60)
Glucose, Bld: 109 mg/dL — ABNORMAL HIGH (ref 70–99)
Potassium: 4.5 mmol/L (ref 3.5–5.1)
Sodium: 138 mmol/L (ref 135–145)

## 2022-01-27 LAB — GLUCOSE, CAPILLARY
Glucose-Capillary: 95 mg/dL (ref 70–99)
Glucose-Capillary: 113 mg/dL — ABNORMAL HIGH (ref 70–99)
Glucose-Capillary: 134 mg/dL — ABNORMAL HIGH (ref 70–99)
Glucose-Capillary: 169 mg/dL — ABNORMAL HIGH (ref 70–99)

## 2022-01-27 MED ORDER — POTASSIUM CHLORIDE CRYS ER 20 MEQ PO TBCR: 20.0000 meq | EXTENDED_RELEASE_TABLET | Freq: Every day | ORAL | 0 refills | Status: AC

## 2022-01-27 MED ORDER — FUROSEMIDE 40 MG PO TABS: 40.0000 mg | ORAL_TABLET | Freq: Every day | ORAL | 0 refills | Status: AC

## 2022-01-27 MED ORDER — BUDESONIDE-FORMOTEROL FUMARATE 160-4.5 MCG/ACT IN AERO: 2.0000 | INHALATION_SPRAY | Freq: Two times a day (BID) | RESPIRATORY_TRACT | 12 refills | Status: AC

## 2022-01-27 MED ORDER — SPIRIVA HANDIHALER 18 MCG IN CAPS: 18.0000 ug | ORAL_CAPSULE | Freq: Every day | RESPIRATORY_TRACT | 2 refills | Status: AC

## 2022-01-27 MED ORDER — IPRATROPIUM-ALBUTEROL 0.5-2.5 (3) MG/3ML IN SOLN: 3.0000 mL | Freq: Four times a day (QID) | RESPIRATORY_TRACT | 2 refills | Status: AC | PRN

## 2022-01-27 MED ORDER — OXYCODONE HCL 5 MG PO TABS: 5.0000 mg | ORAL_TABLET | Freq: Three times a day (TID) | ORAL | 0 refills | Status: DC

## 2022-01-27 NOTE — Progress Notes (Signed)
Inpatient Rehabilitation Admissions Coordinator  ? ?I contacted daughter, [name], by phone. She is aware that I have not received a determination from [facility], but I do not have a CIR bed available today for last one just filled. Next bed availability into next week.She requests to speak to [facility] and to arrange home with [facility] today. I have notified acute team and TOC. We will sign off at thie time. ? ?Danne Baxter, RN, MSN ?Rehab Admissions Coordinator ?[phone] ?01/27/2022 2:05 PM ? ?

## 2022-01-27 NOTE — Progress Notes (Signed)
Patient discharged home this evening on 4L O2 and with his cell phone and inhalers.  HS Protonix and eliquis given prior to discharge.  Stool softener held due to loose stool earlier in shift. Daughter called and notified of discharge. ?

## 2022-01-27 NOTE — TOC Progression Note (Signed)
Transition of Care (TOC) - Progression Note  ? ? ?Patient Details  ?Name: Jeffrey Stephens ?MRN: [id number] ?Date of Birth: 1940/11/19 ? ?Transition of Care (TOC) CM/SW Contact  ?Zenon Mayo, RN ?Phone Number: ?01/27/2022, 2:17 PM ? ?Clinical Narrative:    ?NCM received call from Max with CIR states that the daughter would like for this NCM to call her to set up [facility] for patient to go home  NCM called [name] , offered choice she chose [facility] for HHPT, [place] and Education officer, museum.  She also states he will need ambulance transport home today. ? ? ?  ?  ? ?Expected Discharge Plan and Services ?  ?  ?  ?  ?  ?                ?  ?  ?  ?  ?  ?  ?  ?  ?  ?  ? ? ?Social Determinants of Health (SDOH) Interventions ?  ? ?Readmission Risk Interventions ?   ? View : No data to display.  ?  ?  ?  ? ? ?

## 2022-01-27 NOTE — Progress Notes (Signed)
Physical Therapy Treatment ?Patient Details ?Name: Jeffrey Stephens ?MRN: [id number] ?DOB: October 16, 1940 ?Today's Date: 01/27/2022 ? ? ?History of Present Illness 81 yo male admitted 4/28 with SOB, hypoxic with Afib with RVR requiring cardioversion and intubation; 4/9 fall at home with Rt hip fx s/p Im nail 4/11, TDWB  PMhx: AAA, basal cell CA, urinary frequency, GERD, DVT LLE, cerebral aneurysm, kidney stones, lung CA, PAF, RA, stroke, TIA, depression. ? ?  ?PT Comments  ? ? The pt was able to make good progress with OOB transfers this session. He completed sit-stand transfers x3, with minA of 2 from elevated surface and modA of 2 from low surface with good ability to maintain TDWB RLE when cued. The pt is limited to ~15-30 seconds static stance and needed repeated cues while standing to decrease wt through RLE. Would benefit from acute inpatient rehab at d/c to maximize functional recovery and decrease dependence on caregivers, but if the pt's family would like to return home instead, will need assist of 2 people for OOB transfers and max HHPT.  ?   ?Recommendations for follow up therapy are one component of a multi-disciplinary discharge planning process, led by the attending physician.  Recommendations may be updated based on patient status, additional functional criteria and insurance authorization. ? ?Follow Up Recommendations ? Acute inpatient rehab (3hours/day) (HHPT if going home) ?  ?  ?Assistance Recommended at Discharge Frequent or constant Supervision/Assistance  ?Patient can return home with the following Two people to help with walking and/or transfers;Two people to help with bathing/dressing/bathroom;Assistance with cooking/housework;Direct supervision/assist for medications management;Direct supervision/assist for financial management;Assist for transportation;Help with stairs or ramp for entrance ?  ?Equipment Recommendations ? Other (comment) (medical Lucianne Lei for transport home (per PT eval pending  progress))  ?  ?Recommendations for Other Services   ? ? ?  ?Precautions / Restrictions Precautions ?Precautions: Fall ?Precaution Comments: watch O2 sats, goal 92% ?Restrictions ?Weight Bearing Restrictions: Yes ?RLE Weight Bearing: Touchdown weight bearing ?Other Position/Activity Restrictions: TDWB for transfers only  ?  ? ?Mobility ? Bed Mobility ?Overal bed mobility: Needs Assistance ?Bed Mobility: Supine to Sit ?  ?  ?Supine to sit: Mod assist ?  ?  ?General bed mobility comments: modA to move LE and then to elevate trunk from flat bed ?  ? ?Transfers ?Overall transfer level: Needs assistance ?Equipment used: Rolling walker (2 wheels) ?Transfers: Sit to/from Stand, Bed to chair/wheelchair/BSC ?Sit to Stand: Min assist, +2 physical assistance, From elevated surface, Mod assist ?Stand pivot transfers: Mod assist, +2 physical assistance ?  ?  ?  ? Lateral/Scoot Transfers: Min guard ?General transfer comment: modA to stand from low bed and recliner, minA of 2 to stand from elevated surface. Pt able to maintain TDWB with min cues. increased cues with pivot. completed sit-stand x3 and x1 pivot ?  ? ?Ambulation/Gait ?  ?  ?  ?  ?  ?  ?  ?General Gait Details: pivot transfer only ? ? ? ?  ?Balance Overall balance assessment: Needs assistance, History of Falls ?Sitting-balance support: Feet supported, No upper extremity supported ?Sitting balance-Leahy Scale: Fair ?Sitting balance - Comments: supervision for seated balance, at times reaching for UE support ?  ?Standing balance support: Bilateral upper extremity supported, Reliant on assistive device for balance ?Standing balance-Leahy Scale: Poor ?Standing balance comment: minA of 2 with cues for RLE wt bearing. BUE support on RW ?  ?  ?  ?  ?  ?  ?  ?  ?  ?  ?  ?  ? ?  ?  Cognition Arousal/Alertness: Awake/alert ?Behavior During Therapy: [facility] for tasks assessed/performed ?Overall Cognitive Status: History of cognitive impairments - at baseline ?  ?  ?  ?  ?  ?  ?  ?  ?   ?  ?  ?  ?  ?  ?  ?  ?General Comments: has had a stroke and aneurysm, mult cognitive insults.pt able to follow all cues and instructions without repeated instructions at this time. cues for WB repeated with transfers ?  ?  ? ?  ?Exercises   ? ?  ?General Comments General comments (skin integrity, edema, etc.): SpO2 to low of 87 on 4L, increased to 5L to recover, then back to 4 when pt returned to 4L after seated rest ?  ?  ? ?Pertinent Vitals/Pain Pain Assessment ?Pain Assessment: No/denies pain ?Pain Intervention(s): Monitored during session  ? ? ? ?PT Goals (current goals can now be found in the care plan section) Acute Rehab PT Goals ?Patient Stated Goal: To get OOB and get some rehab so I can get stronger then go home ?PT Goal Formulation: With patient/family ?Time For Goal Achievement: 02/05/22 ?Potential to Achieve Goals: Fair ?Progress towards PT goals: Progressing toward goals ? ?  ?Frequency ? ? ? Min 3X/week ? ? ? ?  ?PT Plan Current plan remains appropriate  ? ? ?   ?AM-PAC PT "6 Clicks" Mobility   ?Outcome Measure ? Help needed turning from your back to your side while in a flat bed without using bedrails?: A Little ?Help needed moving from lying on your back to sitting on the side of a flat bed without using bedrails?: A Lot ?Help needed moving to and from a bed to a chair (including a wheelchair)?: A Lot ?Help needed standing up from a chair using your arms (e.g., wheelchair or bedside chair)?: Total ?Help needed to walk in hospital room?: Total ?Help needed climbing 3-5 steps with a railing? : Total ?6 Click Score: 10 ? ?  ?End of Session Equipment Utilized During Treatment: Oxygen ?Activity Tolerance: Patient tolerated treatment well ?Patient left: with call bell/phone within reach;in chair;with chair alarm set ?Nurse Communication: Mobility status ?PT Visit Diagnosis: Muscle weakness (generalized) (M62.81);Other abnormalities of gait and mobility (R26.89);Pain;Dizziness and giddiness (R42);History  of falling (Z91.81);Other (comment) ?Pain - Right/Left: Right ?Pain - part of body: Hip;Knee ?  ? ? ?Time: 2620-3559 ?PT Time Calculation (min) (ACUTE ONLY): 41 min ? ?Charges:  $Therapeutic Exercise: 23-37 mins ?$Therapeutic Activity: 8-22 mins          ?          ? ?West Carbo, PT, DPT  ? ?Acute Rehabilitation Department ?Pager #: [phone] ? ? ?Sandra Cockayne ?01/27/2022, 3:13 PM ? ?

## 2022-01-27 NOTE — Progress Notes (Addendum)
Inpatient Rehabilitation Admissions Coordinator  ? ?I await insurance approval for possible CIR admit. I spoke with daughter, [name], by phone and she is aware. ? ?Danne Baxter, RN, MSN ?Rehab Admissions Coordinator ?[phone] ?01/27/2022 8:36 AM ? ?

## 2022-01-27 NOTE — Care Management Important Message (Signed)
Important Message ? ?Patient Details  ?Name: Jeffrey Stephens ?MRN: [id number] ?Date of Birth: 10/22/1940 ? ? ?Medicare Important Message Given:  Yes ? ? ? ? ?[name] ?01/27/2022, 11:04 AM ?

## 2022-01-27 NOTE — Discharge Summary (Signed)
? ?PATIENT DETAILS ?Name: Jeffrey Stephens ?Age: 81 y.o. ?Sex: male ?Date of Birth: 1940-12-02 ?MRN: [id number]. ?Admitting Physician: Jacky Kindle, MD ?FVC:[id number], Jenny Reichmann, MD ? ?Admit Date: 01/20/2022 ?Discharge date: 01/27/2022 ? ?Recommendations for Outpatient Follow-up:  ?Follow up with PCP in 1-2 weeks ?Please obtain CMP/CBC in one week ? ?Admitted From:  ?Home ? ? ?Disposition: ?Home ?  ?Discharge Condition: ?good ? ?CODE STATUS: ?  Code Status: Full Code  ? ?Diet recommendation:  ?Diet Order   ? ?       ?  Diet - low sodium heart healthy       ?  ?  Diet regular Room service appropriate? Yes; Fluid consistency: Thin  Diet effective now       ?  ? ?  ?  ? ?  ?  ? ?Brief Summary: ?Patient is a 81 y.o.  male with history of atrial flutter on Eliquis, VTE, lung cancer s/p lobectomy 2017, RA on methotrexate, COPD home O2 -recent hospitalization from 4/9-4/16 with right hip fracture following a mechanical fall-s/p ORIF-presented to the hospital with shortness of breath/cough-patient was found to have acute on chronic hypoxic/hypercapnic respiratory failure requiring intubation-due to rhinovirus PNA, COPD exacerbation and A-fib with RVR.  Patient was stabilized in the ICU-extubated on 4/29-and subsequently transferred to [facility] on 5/1. ?  ?Significant events: ?4/9-4/16>> right hip fracture-s/p ORIF-discharged home with home health. ?4/28>> presented to ED with shortness of breath-intubated-severe hypoxemia due to rhinovirus-A-fib RVR s/p synchronized cardioversion in the ED ?4/28>> extubated ?5/01>> transfer to [facility] ?  ?Significant studies: ?4/28>> interstitial thickening superimposed on obstructive lung disease-likely PNA versus CHF. ?4/28>> Echo: EF 60-65% ?  ?Significant microbiology data: ?4/28>> COVID/influenza PCR: Negative ?4/28>> respiratory virus panel: Rhinovirus +ve ?4/28>> urine culture: No growth ?4/28>> blood culture: No growth ?  ?Procedures: ?ETT: 4/28>> 4/29 ?  ?Consults: ?PCCM,ortho ? ?Brief Hospital  Course: ?Acute on chronic hypoxic/hypercarbic respiratory failure due to rhinovirus PNA COPD exacerbation: Significantly better-back on his usual home regimen of 4 L.  Continue tapering prednisone on discharge-remains on bronchodilators. ?  ?Acute metabolic encephalopathy: Due to hypercarbia-resolved-currently awake and alert. ?  ?Persistent atrial flutter: Sinus rhythm this morning-continue Cardizem and Eliquis.  Follow with cardiology.   ?  ?Acute on chronic HFpEF: Volume status has improved dramatically-continue furosemide. ? ?History of DVT: Remains on Eliquis. ?  ?History of AAA: Outpatient surveillance imaging. ? ?CKD stage IIIa: Creatinine close to baseline-monitor periodically. ?  ?Normocytic anemia: Due to combination of acute illness-possible recent blood loss from hip fracture.  CBC with stable hemoglobin-follow periodically. ? ?History of lung cancer-s/p lobectomy 2017: Stable-follow-up with his outpatient physicians. ? ?RA: MethotrexaTE held-will be resumed on discharge. ?  ?History of recent right hip fracture requiring IM nailing: Ortho following-activity advanced touchdown weightbearing ?  ?Debility/deconditioning: Evaluated by PT/OT-recommendations are for CIR. however patient has been very frustrated about the delay in getting insurance authorization-family ultimately decided to take him home with home health services-as it is likely he will not get the bed over the weekend and will probably be sometime next week if he is approved. ?  ?Nutrition Status: ?Nutrition Problem: Inadequate oral intake ?Etiology: inability to eat ?Signs/Symptoms: NPO status ?Interventions: Ensure Enlive (each supplement provides 350kcal and 20 grams of protein) ?  ?Obesity: ?Estimated body mass index is 34.97 kg/m? as calculated from the following: ?  Height as of this encounter: 5\' 9"  (1.753 m). ?  Weight as of this encounter: 107.4 kg.  ?  ? ?Discharge Diagnoses:  ?  Principal Problem: ?  Acute on chronic respiratory  failure with hypoxemia due to rhinovirus pneumonia ([place]) ?Active Problems: ?  Abdominal aortic aneurysm ([place]) ?  Rheumatoid arthritis ([place]) ?  Lung cancer, lower lobe ([place]) ?  History of DVT (deep vein thrombosis) ?  Acute respiratory failure ([place]) ?  Acute metabolic encephalopathy ?  CAP (community acquired pneumonia) ?  Atrial fibrillation with rapid ventricular response ([place]) ?  COPD exacerbation ([place]) ?  CKD stage G3a/A1, GFR 45-59 and albumin creatinine ratio <30 mg/g (HCC) ?  Hyponatremia ?  Pneumonia due to virus ?  Acute pulmonary edema (HCC) ? ? ?Discharge Instructions: ? ?Activity:  ?As tolerated with Full fall precautions use walker/cane & assistance as needed ? ?Discharge Instructions   ? ? Diet - low sodium heart healthy   Complete by: As directed ?  ? Discharge instructions   Complete by: As directed ?  ? Follow with Primary MD  Lavone Orn, MD in 1-2 weeks ? ?Please get a complete blood count and chemistry panel checked by your Primary MD at your next visit, and again as instructed by your Primary MD. ? ?Get Medicines reviewed and adjusted: ?Please take all your medications with you for your next visit with your Primary MD ? ?Laboratory/radiological data: ?Please request your Primary MD to go over all hospital tests and procedure/radiological results at the follow up, please ask your Primary MD to get all Hospital records sent to his/her office. ? ?In some cases, they will be blood work, cultures and biopsy results pending at the time of your discharge. Please request that your primary care M.D. follows up on these results. ? ?Also Note the following: ?If you experience worsening of your admission symptoms, develop shortness of breath, life threatening emergency, suicidal or homicidal thoughts you must seek medical attention immediately by calling 911 or calling your MD immediately  if symptoms less severe. ? ?You must read complete instructions/literature along with all the possible adverse  reactions/side effects for all the Medicines you take and that have been prescribed to you. Take any new Medicines after you have completely understood and accpet all the possible adverse reactions/side effects.  ? ?Do not drive when taking Pain medications or sleeping medications (Benzodaizepines) ? ?Do not take more than prescribed Pain, Sleep and Anxiety Medications. It is not advisable to combine anxiety,sleep and pain medications without talking with your primary care practitioner ? ?Special Instructions: If you have smoked or chewed Tobacco  in the last 2 yrs please stop smoking, stop any regular Alcohol  and or any Recreational drug use. ? ?Wear Seat belts while driving. ? ?Please note: ?You were cared for by a hospitalist during your hospital stay. Once you are discharged, your primary care physician will handle any further medical issues. Please note that NO REFILLS for any discharge medications will be authorized once you are discharged, as it is imperative that you return to your primary care physician (or establish a relationship with a primary care physician if you do not have one) for your post hospital discharge needs so that they can reassess your need for medications and monitor your lab values.  ? Increase activity slowly   Complete by: As directed ?  ? Touchdown weightbearing to right lower extremity.  ? No dressing needed   Complete by: As directed ?  ? ?  ? ?Allergies as of 01/27/2022   ?No Known Allergies ?  ? ?  ?Medication List  ?  ? ?STOP taking these  medications   ? ?Tiotropium Bromide-Olodaterol 2.5-2.5 MCG/ACT Aers ?  ? ?  ? ?TAKE these medications   ? ?acetaminophen 500 MG tablet ?Commonly known as: TYLENOL ?Take 1,000 mg by mouth every 6 (six) hours as needed for moderate pain. ?  ?albuterol 108 (90 Base) MCG/ACT inhaler ?Commonly known as: VENTOLIN HFA ?TAKE 2 PUFFS BY MOUTH EVERY 6 HOURS AS NEEDED FOR WHEEZE OR SHORTNESS OF BREATH ?What changed:  ?how much to take ?how to take  this ?when to take this ?reasons to take this ?additional instructions ?  ?budesonide-formoterol 160-4.5 MCG/ACT inhaler ?Commonly known as: Symbicort ?Inhale 2 puffs into the lungs 2 (two) times daily. ?  ?Centrum Silver Adul

## 2022-01-27 NOTE — TOC Transition Note (Addendum)
Transition of Care (TOC) - CM/SW Discharge Note ? ? ?Patient Details  ?Name: Jeffrey Stephens ?MRN: [id number] ?Date of Birth: 03-Aug-1941 ? ?Transition of Care (TOC) CM/SW Contact:  ?Zenon Mayo, RN ?Phone Number: ?01/27/2022, 2:20 PM ? ? ?Clinical Narrative:    ?NCM received call from [place] with CIR states that the daughter would like for this NCM to call her to set up [facility] for patient to go home  NCM called [name] , offered choice she chose [facility] for HHPT, [place] and Education officer, museum.  She also states he will need ambulance transport home today. Daughter states she will need the oxycodone to be refilled he only has two pills left from his hip surgery.  Also he is on 4 liters oxygen with [employer] at home already.  PTAR scheduled , he is number 12 on the list so it will be 4.5 hrs from now per ptar.  Adapt to bring neb machine up to to patient room to give to daughter.   ? ? ?Final next level of care: [place] ?Barriers to Discharge: No Barriers Identified ? ? ?Patient Goals and CMS Choice ?Patient states their goals for this hospitalization and ongoing recovery are:: return home ?CMS Medicare.gov Compare Post Acute Care list provided to:: Patient Represenative (must comment) ?Choice offered to / list presented to : Adult Children ? ?Discharge Placement ?  ?           ?  ?  ?  ?  ? ?Discharge Plan and Services ?  ?  ?           ?  ?DME Agency: NA ?  ?  ?  ?HH Arranged: PT, OT, Social Work ?[place] Agency: [place] ?Date HH Agency Contacted: 01/27/22 ?Time [place]: 5993 ?Representative spoke with at [place]: Tommi Rumps ? ?Social Determinants of Health (SDOH) Interventions ?  ? ? ?Readmission Risk Interventions ?   ? View : No data to display.  ?  ?  ?  ? ? ? ? ? ?

## 2022-02-13 DIAGNOSIS — I5032 Chronic diastolic (congestive) heart failure: Secondary | ICD-10-CM | POA: Diagnosis not present

## 2022-02-13 DIAGNOSIS — J9611 Chronic respiratory failure with hypoxia: Secondary | ICD-10-CM | POA: Diagnosis not present

## 2022-02-13 DIAGNOSIS — D649 Anemia, unspecified: Secondary | ICD-10-CM | POA: Diagnosis not present

## 2022-02-13 DIAGNOSIS — I4892 Unspecified atrial flutter: Secondary | ICD-10-CM | POA: Diagnosis not present

## 2022-02-13 DIAGNOSIS — Z8781 Personal history of (healed) traumatic fracture: Secondary | ICD-10-CM | POA: Diagnosis not present

## 2022-02-13 DIAGNOSIS — B356 Tinea cruris: Secondary | ICD-10-CM | POA: Diagnosis not present

## 2022-02-13 DIAGNOSIS — J189 Pneumonia, unspecified organism: Secondary | ICD-10-CM | POA: Diagnosis not present

## 2022-02-13 DIAGNOSIS — J449 Chronic obstructive pulmonary disease, unspecified: Secondary | ICD-10-CM | POA: Diagnosis not present

## 2022-02-14 ENCOUNTER — Telehealth: Payer: Self-pay | Admitting: Orthopedic Surgery

## 2022-02-14 NOTE — Telephone Encounter (Signed)
Patient's daughter called advised patient need an appointment this week with Dr. Marlou Sa. The number to contact [name] is [phone]

## 2022-02-15 ENCOUNTER — Telehealth: Payer: Self-pay | Admitting: Orthopedic Surgery

## 2022-02-15 NOTE — Telephone Encounter (Signed)
Pt's daughter [name] states she is returning a call from Rogelia Boga. Please call [name] at [phone].

## 2022-02-16 NOTE — Telephone Encounter (Signed)
Spoke with daughter patient is coming in next Tuesday

## 2022-02-17 NOTE — Telephone Encounter (Signed)
Okay to be worked in next week

## 2022-02-18 DIAGNOSIS — J449 Chronic obstructive pulmonary disease, unspecified: Secondary | ICD-10-CM | POA: Diagnosis not present

## 2022-02-21 ENCOUNTER — Ambulatory Visit (INDEPENDENT_AMBULATORY_CARE_PROVIDER_SITE_OTHER): Payer: Medicare PPO | Admitting: Orthopedic Surgery

## 2022-02-21 ENCOUNTER — Ambulatory Visit (INDEPENDENT_AMBULATORY_CARE_PROVIDER_SITE_OTHER): Payer: Medicare PPO

## 2022-02-21 ENCOUNTER — Telehealth: Payer: Self-pay | Admitting: Orthopedic Surgery

## 2022-02-21 ENCOUNTER — Other Ambulatory Visit: Payer: Self-pay | Admitting: Surgical

## 2022-02-21 DIAGNOSIS — G8918 Other acute postprocedural pain: Secondary | ICD-10-CM

## 2022-02-21 DIAGNOSIS — J129 Viral pneumonia, unspecified: Secondary | ICD-10-CM

## 2022-02-21 MED ORDER — OXYCODONE HCL 5 MG PO TABS: 5.0000 mg | ORAL_TABLET | Freq: Four times a day (QID) | ORAL | 0 refills | Status: DC | PRN

## 2022-02-21 NOTE — Telephone Encounter (Signed)
Sent in a ref[facility] to [facility] in [place]

## 2022-02-21 NOTE — Telephone Encounter (Signed)
Patient's daughter [name] called advised the pharmacy do not have  Oxycodone in stock. [name] asked if the [facility] [place] on [address]. The number to contact [name] is  [phone]

## 2022-02-21 NOTE — Telephone Encounter (Signed)
IC advised done

## 2022-02-22 ENCOUNTER — Encounter: Payer: Self-pay | Admitting: Orthopedic Surgery

## 2022-02-22 NOTE — Progress Notes (Signed)
Post-Op Visit Note   Patient: Jeffrey Stephens           Date of Birth: Mar 15, 1941           MRN: [id number] Visit Date: 02/21/2022 PCP: Lavone Orn, MD   Assessment & Plan:  Chief Complaint:  Chief Complaint  Patient presents with   Post-op Follow-up   Visit Diagnoses:  1. Post-op pain   2. Pneumonia due to virus     Plan: Jeffrey Stephens is a 81 year old patient who is now 6 weeks out right intertrochanteric fracture with nail placement.  Since he was last seen he has had a rhinovirus.  Has had increasing shortness of breath of the last 36 hours.  He is off antibiotics.  He is on Lasix.  On examination he has very good range of motion of the hip with no pain.  No pain with axial loading on the right-hand side.  Has progressed slightly weak hip flexion on the right compared to the left consistent with his lesser trochanteric fracture.  Plan at this time CT chest to evaluate for pneumonia in the right lobe.  Does have some concerning findings on plain chest x-ray today.  Refill oxycodone.  Weightbearing as tolerated  and 4 week return for final check  Follow-Up Instructions: Return in about 4 weeks (around 03/21/2022).   Orders:  Orders Placed This Encounter  Procedures   XR FEMUR, MIN 2 VIEWS RIGHT   XR Chest 2 View   CT CHEST WO CONTRAST   Meds ordered this encounter  Medications   DISCONTD: oxyCODONE (OXY IR/ROXICODONE) 5 MG immediate release tablet    Sig: Take 1 tablet (5 mg total) by mouth every 6 (six) hours as needed for severe pain.    Dispense:  30 tablet    Refill:  0    Imaging: XR Chest 2 View  Result Date: 02/22/2022 AP lateral chest x-ray reviewed.  Increased peripheral lung markings are present.  No pneumothorax.  No effusion.  There is increased consolidation between the lobes on the right-hand side of the lung cavity consistent with possible early pneumonia.  XR FEMUR, MIN 2 VIEWS RIGHT  Result Date: 02/22/2022 AP lateral radiographs right femur reviewed.   Intramedullary hip screw in good position alignment with no complicating features.  No evidence of hardware complication or loosening.   PMFS History: Patient Active Problem List   Diagnosis Date Noted   Arthritis of right knee    Acute metabolic encephalopathy 54/27/0623   CAP (community acquired pneumonia) 01/21/2022   Atrial fibrillation with rapid ventricular response ([place]) 01/21/2022   COPD exacerbation ([place]) 01/21/2022   CKD stage G3a/A1, GFR 45-59 and albumin creatinine ratio <30 mg/g (HCC) 01/21/2022   Hyponatremia 01/21/2022   Pneumonia due to virus 01/21/2022   Acute pulmonary edema ([place]) 01/21/2022   Acute respiratory failure ([place]) 01/20/2022   Hip fracture ([place]) 01/02/2022   Leukocytosis 01/02/2022   Normocytic anemia 01/02/2022   History of DVT (deep vein thrombosis) 01/02/2022   Acute on chronic respiratory failure with hypoxemia due to rhinovirus pneumonia ([place]) 03/22/2021   COPD with emphysema ([place]) 03/03/2020   Aortic atherosclerosis ([place]) 08/30/2018   Long term current use of anticoagulant 01/18/2017   Lung cancer, lower lobe ([place]) 05/08/2016   Anticoagulation adequate 08/14/2014   Paroxysmal atrial flutter ([place]) 08/12/2014   Atrial flutter ([place]) 08/12/2014   Abnormal nuclear cardiac imaging test 07/13/2014   Coronary artery calcification seen on CT scan 06/17/2014   Atrial flutter with  rapid ventricular response, converted with sotalol 06/11/2014   Left knee DJD 05/14/2014   Abdominal aortic aneurysm ([place]) 07/31/2008   Pulmonary nodules 07/31/2008   Rheumatoid arthritis ([place]) 07/31/2008   SKIN CANCER, HX OF 07/31/2008   NEPHROLITHIASIS, HX OF 07/31/2008   BENIGN PROSTATIC HYPERTROPHY, HX OF 07/31/2008   Past Medical History:  Diagnosis Date   AAA (abdominal aortic aneurysm) ([place])    a. followed by Dr. Deitra Mayo - 01/2016 CT: 3.6 cm infrarenal AAA.   Abnormal nuclear cardiac imaging test 07/13/2014   a. 06/2014 MV: possible reversible inferior  wall defect,no wma, nl EF, felt to be artifact.   Basal cell carcinoma    "LLE; some on my head"   BPH (benign prostatic hyperplasia)    Cerebral aneurysm 1994   Constipation    Depression    DVT (deep venous thrombosis) ([place]) 2001   Hx of Left leg    Dysrhythmia    AFIB    Frequency of urination    GERD (gastroesophageal reflux disease)    History of echocardiogram    a. 2D ECHO: 06/11/2014: EF 55-60%. Normal wall thickness. Indeterminate DD ( atrial flutter ). No RWMA. Mild LA dilation. Normal RV size and systolic function. No significant valvular abnormalities.   History of palpitations    OCCASIONAL   Kidney stones    Lung nodule    a. 02/2016 PET scan: intensely hypermetabolic 2.0 x 1.5 cm central RLL nodule (SUV 23).   Paroxysmal atrial flutter ([place])    a. On eliquis and sotalol (CHA2DS2VASc = 1).   Rheumatoid arthritis ([place])    "qwhere"   Stroke [facility]) 1994   "when I had ruptured aneurysm in my head"   TIA (transient ischemic attack)    1994 WITH ANEYRYSM    Family History  Problem Relation Age of Onset   Heart disease Father        Aneurysm   Diabetes Father    Heart attack Father    Heart disease Brother        Heart Disease before age 79   Cancer Brother    Hypertension Neg Hx    Stroke Neg Hx     Past Surgical History:  Procedure Laterality Date   BASAL CELL CARCINOMA EXCISION     "LLE; top of my head"   CEREBRAL ANEURYSM REPAIR  1994   Hx of ruptured brain aneurysm Tx by Dr. Ellene Route   CYSTOSCOPY W/ STONE MANIPULATION  1980's X 1   EXCISIONAL HEMORRHOIDECTOMY  1970's   INTRAMEDULLARY (IM) NAIL INTERTROCHANTERIC Right 01/03/2022   Procedure: INTRAMEDULLARY (IM) NAIL INTERTROCHANTRIC; ASPIRATION OF RIGHT KNEE EFFUSION AND INJECTION.;  Surgeon: Meredith Pel, MD;  Location: [facility];  Service: Orthopedics;  Laterality: Right;   JOINT REPLACEMENT     LT KNEE   TOTAL KNEE ARTHROPLASTY Left 05/14/2014   Procedure: LEFT TOTAL KNEE ARTHROPLASTY;  Surgeon:  Johnn Hai, MD;  Location: [facility];  Service: Orthopedics;  Laterality: Left;   VIDEO ASSISTED THORACOSCOPY (VATS)/WEDGE RESECTION Right 05/08/2016   Procedure: VIDEO ASSISTED THORACOSCOPY (VATS)/LUNG RESECTION;  Surgeon: Ivin Poot, MD;  [facility];  Service: Thoracic;  Laterality: Right;   Social History   Occupational History   Not on file  Tobacco Use   Smoking status: Former    Packs/day: 2.50    Years: 37.00    Pack years: 92.50    Types: Cigarettes    Quit date: 09/25/1992    Years since quitting: 29.4  Smokeless tobacco: Never  Vaping Use   Vaping Use: Never used  Substance and Sexual Activity   Alcohol use: No    Alcohol/week: 0.0 standard drinks   Drug use: No   Sexual activity: Not Currently

## 2022-02-23 ENCOUNTER — Ambulatory Visit
Admission: RE | Admit: 2022-02-23 | Discharge: 2022-02-23 | Disposition: A | Discharge status: Home / Self Care | Payer: Medicare PPO | Source: Ambulatory Visit | Attending: Orthopedic Surgery | Admitting: Orthopedic Surgery

## 2022-02-23 ENCOUNTER — Telehealth: Payer: Self-pay | Admitting: Orthopedic Surgery

## 2022-02-23 DIAGNOSIS — J90 Pleural effusion, not elsewhere classified: Secondary | ICD-10-CM | POA: Diagnosis not present

## 2022-02-23 DIAGNOSIS — I3139 Other pericardial effusion (noninflammatory): Secondary | ICD-10-CM | POA: Diagnosis not present

## 2022-02-23 DIAGNOSIS — J439 Emphysema, unspecified: Secondary | ICD-10-CM | POA: Diagnosis not present

## 2022-02-23 DIAGNOSIS — Z85110 Personal history of malignant carcinoid tumor of bronchus and lung: Secondary | ICD-10-CM | POA: Diagnosis not present

## 2022-02-23 DIAGNOSIS — J189 Pneumonia, unspecified organism: Secondary | ICD-10-CM | POA: Diagnosis not present

## 2022-02-23 DIAGNOSIS — J129 Viral pneumonia, unspecified: Secondary | ICD-10-CM

## 2022-02-23 DIAGNOSIS — R911 Solitary pulmonary nodule: Secondary | ICD-10-CM | POA: Diagnosis not present

## 2022-02-23 NOTE — Telephone Encounter (Signed)
Pt's daughter called stating pt had a CT Scan for phenomena Dr. Marlou Sa sent pt for. Pt's daughter states CT Tech informed her results would be back in a few hours and asking for a call back from Dr. Marlou Sa

## 2022-02-23 NOTE — Telephone Encounter (Signed)
Pt's daughter [name] called requesting a call back from Dr. Marlou Sa. She states Dr. Marlou Sa sent pt for a CT Scan to see if he has pneumonia. She also stated the CT Tech informed her that the results will be back a couple hours and they are asking for Dr. Marlou Sa to call with results. Please call Pt's daughter [name] at [phone].

## 2022-02-23 NOTE — Progress Notes (Signed)
Can you send this to Lavone Orn and let him know about this issue thanks

## 2022-02-23 NOTE — Telephone Encounter (Signed)
Duplicate see other note

## 2022-02-28 ENCOUNTER — Encounter: Payer: Self-pay | Admitting: Emergency Medicine

## 2022-02-28 ENCOUNTER — Ambulatory Visit: Payer: Medicare PPO | Admitting: Emergency Medicine

## 2022-02-28 VITALS — BP 128/70 | HR 72 | Temp 98.4°F | Ht 69.0 in | Wt 220.0 lb

## 2022-02-28 DIAGNOSIS — J431 Panlobular emphysema: Secondary | ICD-10-CM | POA: Diagnosis not present

## 2022-02-28 DIAGNOSIS — R918 Other nonspecific abnormal finding of lung field: Secondary | ICD-10-CM | POA: Diagnosis not present

## 2022-02-28 DIAGNOSIS — J189 Pneumonia, unspecified organism: Secondary | ICD-10-CM | POA: Diagnosis not present

## 2022-02-28 MED ORDER — ALBUTEROL SULFATE (2.5 MG/3ML) 0.083% IN NEBU
2.5000 mg | INHALATION_SOLUTION | Freq: Four times a day (QID) | RESPIRATORY_TRACT | 12 refills | Status: AC | PRN
Start: 2022-02-28 — End: ?

## 2022-02-28 NOTE — Assessment & Plan Note (Signed)
Severe right upper lobe pneumonia.  Improved on chest x-ray.  His most recent CT chest shows persistent right upper lobe airspace disease, question evolving scar.  Needs a repeat scan in 3 months  We will repeat your CT scan of the chest in 3 months

## 2022-02-28 NOTE — Patient Instructions (Addendum)
Please continue Symbicort 2 puffs twice a day.  Rinse and gargle after using. Please continue your Spiriva once daily. Keep albuterol available to use either 2 puffs or 1 nebulizer treatment up to every 4 hours if needed for shortness of breath.  We will give you a prescription for the nebulizer version of this medication. Please start taking guaifenesin 600 mg twice a day (generic Mucinex) Continue your oxygen at all times We will repeat your CT scan of the chest in 3 months Follow Dr. Lamonte Sakai in 3 months to review your CT scan or sooner if you have any problems.

## 2022-02-28 NOTE — Progress Notes (Signed)
Subjective:    Patient ID: Jeffrey Stephens, male    DOB: [birth year], 81 y.o.   MRN: [id number]  HPI 81 year old former smoker (68 pack years) previously followed by Dr. Gwenette Greet for COPD, hx DVT, A fib, RA on MTX, prior cerebral aneurysmal rupture, underwent right lower lobe resection for stage I poorly differentiated adenocarcinoma on 04/2016.  ROV 09/20/21 --Mr. Jeffrey Stephens a 80, former smoker with a history of COPD, stage I adenocarcinoma of the lung resected in 04/2016.  Also with a history of RA on methotrexate, DVT, A. fib.  Currently managed on Stiolto.  He has been found to desaturate with exertion, has a POC, uses it in response to exertional SOB. He believes that Stiolto is helpful. He has averaged albuterol about once a day. No flares since last time. He coughs daily, productive of greenish mucous, thick and can be hard to clear. Exercise tolerance has declined some.  He has had flu shot, one covid booster, due for next.   CT chest June/21/22 reviewed by me showed stable bilateral small pulmonary nodules consistent with benign process, no evidence of recurrence.   ROV 02/28/22 --man with COPD and a history of right lower lobe lung resection for stage I adenocarcinoma (2017), rheumatoid arthritis on methotrexate, DVT, A-fib, chronic hypoxemic respiratory failure.  He had benign pulmonary nodules that we had followed with serial imaging that were unchanged in June 2022, do not require any further imaging.  He had a severe R PNA and was admitted, required intubation. Discharged 01/27/22. Continued to feel very weak. Remains on Spiriva and Symbicort. He is limited, both by breathing and by hip fracture.   CT chest 02/23/22 reviewed by me shows severe emphysema, prior right lower lobe wedge resection, diffuse right upper lobe groundglass opacity with nodular component.  New enlarged mediastinal nodes, suspect related to pneumonia  Review of Systems As per HPI      Objective:   Physical  Exam Vitals:   02/28/22 1447  BP: 128/70  Pulse: 72  Temp: 98.4 F (36.9 C)  TempSrc: Oral  SpO2: 92%  Weight: 220 lb (99.8 kg)  Height: 5\' 9"  (1.753 m)   Gen: Pleasant, obese, in no distress,  normal affect  ENT: No lesions,  mouth clear,  oropharynx clear, no postnasal drip  Neck: No JVD, no stridor  Lungs: No use of accessory muscles, distant, no crackles or wheezing on normal respiration, no wheeze on forced expiration  Cardiovascular: RRR, heart sounds normal, no murmur or gallops, no peripheral edema  Musculoskeletal: No deformities, no cyanosis or clubbing  Neuro: alert, awake, non focal  Skin: Warm, no lesions or rash     Assessment & Plan:  COPD with emphysema (HCC) Severe COPD, recent right-sided pneumonia with persistent abnormalities.  Seems to be improving, not back to baseline  Please continue Symbicort 2 puffs twice a day.  Rinse and gargle after using. Please continue your Spiriva once daily. Keep albuterol available to use either 2 puffs or 1 nebulizer treatment up to every 4 hours if needed for shortness of breath.  We will give you a prescription for the nebulizer version of this medication. Please start taking guaifenesin 600 mg twice a day (generic Mucinex) Follow Dr. Lamonte Sakai in 3 months  CAP (community acquired pneumonia) Severe right upper lobe pneumonia.  Improved on chest x-ray.  His most recent CT chest shows persistent right upper lobe airspace disease, question evolving scar.  Needs a repeat scan in 3 months  We will  repeat your CT scan of the chest in 3 months  Baltazar Apo, MD, PhD 02/28/2022, 5:22 PM [facility] [phone] or if no answer [phone]

## 2022-02-28 NOTE — Telephone Encounter (Signed)
Were you able to send him to his primary care?  Yes looks like he has potentially some consolidation in the right upper lobe

## 2022-02-28 NOTE — Telephone Encounter (Signed)
I was not advised patient needed to see PCP was waiting on advice from scan results. I did however call patients daughter today to check on status of patient and to let them know should follow up with PCP.  She did not answer.

## 2022-02-28 NOTE — Assessment & Plan Note (Signed)
Severe COPD, recent right-sided pneumonia with persistent abnormalities.  Seems to be improving, not back to baseline  Please continue Symbicort 2 puffs twice a day.  Rinse and gargle after using. Please continue your Spiriva once daily. Keep albuterol available to use either 2 puffs or 1 nebulizer treatment up to every 4 hours if needed for shortness of breath.  We will give you a prescription for the nebulizer version of this medication. Please start taking guaifenesin 600 mg twice a day (generic Mucinex) Follow Dr. Lamonte Sakai in 3 months

## 2022-03-16 ENCOUNTER — Encounter: Payer: Self-pay | Admitting: Vascular Surgery

## 2022-03-16 ENCOUNTER — Ambulatory Visit (HOSPITAL_COMMUNITY)
Admission: RE | Admit: 2022-03-16 | Discharge: 2022-03-16 | Disposition: A | Discharge status: Home / Self Care | Payer: Medicare PPO | Source: Ambulatory Visit | Attending: Vascular Surgery | Admitting: Vascular Surgery

## 2022-03-16 ENCOUNTER — Ambulatory Visit: Payer: Medicare PPO | Admitting: Vascular Surgery

## 2022-03-16 VITALS — BP 111/54 | HR 69 | Temp 97.7°F | Resp 20 | Ht 69.0 in | Wt 220.0 lb

## 2022-03-16 DIAGNOSIS — I7143 Infrarenal abdominal aortic aneurysm, without rupture: Secondary | ICD-10-CM

## 2022-03-16 NOTE — Progress Notes (Signed)
REASON FOR VISIT:   Follow-up of abdominal aortic aneurysm.  MEDICAL ISSUES:   ABDOMINAL AORTIC ANEURYSM: This patient's 4.4 cm infrarenal abdominal aortic aneurysm has not changed significantly in size in 9 months.  He understands we would not consider elective repair in a normal risk patient unless it reached 5.5 cm in maximum diameter.  Given his severe COPD he would obviously be at high risk for surgery.  I think we can keep his 49-month follow-up interval and I have ordered a duplex at that time.  I have explained that if the aneurysm enlarges significantly we would consider CT angiography in order to assess his options for repair.  Hopefully however this will remain stable in size as he would be at increased risk for surgery.  Fortunately he is not a smoker.  He quit in 1994.  His blood pressures been under good control.   HPI:   Jeffrey Stephens is a pleasant 81 y.o. male who I last saw on 06/16/2021.  We have been following him with an abdominal aortic aneurysm.  When I saw him last the aneurysm measured 4.4 cm in maximum diameter.  The right common iliac artery measures 1.8 cm in maximum diameter.  The left common iliac artery measures 1.4 cm in maximum diameter.  His blood pressure was under good control.  He was not a smoker.  I thought that it would be reasonable to stretch his follow-up out to 9 months and I ordered a duplex for that follow-up visit.  Since I saw him last, he fell on Easter and sustained a right hip fracture.  2 weeks later he developed pneumonia and was in the hospital for some time.  He is now on continuous O2 for now.  He is getting physical therapy at home.  He denies any abdominal pain or back pain.  I do not get any history of claudication although his activity is very limited at this point.  He denies any history of rest pain.  Past Medical History:  Diagnosis Date   AAA (abdominal aortic aneurysm) ([place])    a. followed by Dr. Deitra Mayo - 01/2016  CT: 3.6 cm infrarenal AAA.   Abnormal nuclear cardiac imaging test 07/13/2014   a. 06/2014 MV: possible reversible inferior wall defect,no wma, nl EF, felt to be artifact.   Basal cell carcinoma    "LLE; some on my head"   BPH (benign prostatic hyperplasia)    Cerebral aneurysm 1994   Constipation    Depression    DVT (deep venous thrombosis) ([place]) 2001   Hx of Left leg    Dysrhythmia    AFIB    Frequency of urination    GERD (gastroesophageal reflux disease)    History of echocardiogram    a. 2D ECHO: 06/11/2014: EF 55-60%. Normal wall thickness. Indeterminate DD ( atrial flutter ). No RWMA. Mild LA dilation. Normal RV size and systolic function. No significant valvular abnormalities.   History of palpitations    OCCASIONAL   Kidney stones    Lung nodule    a. 02/2016 PET scan: intensely hypermetabolic 2.0 x 1.5 cm central RLL nodule (SUV 23).   Paroxysmal atrial flutter ([place])    a. On eliquis and sotalol (CHA2DS2VASc = 1).   Rheumatoid arthritis ([place])    "qwhere"   Stroke [facility]) 1994   "when I had ruptured aneurysm in my head"   TIA (transient ischemic attack)    [place]    Family  History  Problem Relation Age of Onset   Heart disease Father        Aneurysm   Diabetes Father    Heart attack Father    Heart disease Brother        Heart Disease before age 22   Cancer Brother    Hypertension Neg Hx    Stroke Neg Hx     SOCIAL HISTORY: Social History   Tobacco Use   Smoking status: Former    Packs/day: 2.50    Years: 37.00    Total pack years: 92.50    Types: Cigarettes    Quit date: 09/25/1992    Years since quitting: 29.4   Smokeless tobacco: Never  Substance Use Topics   Alcohol use: No    Alcohol/week: 0.0 standard drinks of alcohol    No Known Allergies  Current Outpatient Medications  Medication Sig Dispense Refill   acetaminophen (TYLENOL) 500 MG tablet Take 1,000 mg by mouth every 6 (six) hours as needed for moderate pain.     albuterol  (PROVENTIL) (2.5 MG/3ML) 0.083% nebulizer solution Take 3 mLs (2.5 mg total) by nebulization every 6 (six) hours as needed for wheezing or shortness of breath. 75 mL 12   albuterol (VENTOLIN HFA) 108 (90 Base) MCG/ACT inhaler TAKE 2 PUFFS BY MOUTH EVERY 6 HOURS AS NEEDED FOR WHEEZE OR SHORTNESS OF BREATH (Patient taking differently: Inhale 2 puffs into the lungs every 6 (six) hours as needed for shortness of breath or wheezing.) 8.5 g 6   budesonide-formoterol (SYMBICORT) 160-4.5 MCG/ACT inhaler Inhale 2 puffs into the lungs 2 (two) times daily. 1 each 12   Cholecalciferol (VITAMIN D3 PO) Take 2,000 mcg by mouth in the morning and at bedtime.     diltiazem (CARDIZEM CD) 180 MG 24 hr capsule Take 1 capsule (180 mg total) by mouth daily. 30 capsule 2   ELIQUIS 5 MG TABS tablet TAKE 1 TABLET BY MOUTH TWICE A DAY (Patient taking differently: Take 5 mg by mouth 2 (two) times daily.) 180 tablet 1   Fluticasone Propionate (FLONASE ALLERGY RELIEF NA) Place 1 spray into the nose daily as needed (allergy relief).     folic acid (FOLVITE) 1 MG tablet Take 1 mg by mouth every morning.      furosemide (LASIX) 40 MG tablet Take 1 tablet (40 mg total) by mouth daily. 30 tablet 0   guaiFENesin-dextromethorphan (ROBITUSSIN DM) 100-10 MG/5ML syrup Take 10 mLs by mouth every 4 (four) hours as needed for cough.     ipratropium-albuterol (DUONEB) 0.5-2.5 (3) MG/3ML SOLN Take 3 mLs by nebulization every 6 (six) hours as needed. 360 mL 2   lactulose (CHRONULAC) 10 GM/15ML solution Take 30 mLs (20 g total) by mouth 2 (two) times daily. (Patient taking differently: Take 20 g by mouth daily.) 946 mL 0   lansoprazole (PREVACID) 30 MG capsule Take 30 mg by mouth every morning.     methocarbamol (ROBAXIN) 500 MG tablet Take 1 tablet (500 mg total) by mouth every 6 (six) hours as needed for muscle spasms. 30 tablet 0   methotrexate (RHEUMATREX) 5 MG tablet Take 10 mg by mouth every Monday. Caution: Chemotherapy. Protect from light.      Multiple Vitamins-Minerals (CENTRUM SILVER ADULT 50+) TABS Take 50 mg by mouth daily.     oxyCODONE (OXY IR/ROXICODONE) 5 MG immediate release tablet Take 1 tablet (5 mg total) by mouth every 6 (six) hours as needed for severe pain. 30 tablet 0   potassium  chloride SA (KLOR-CON M) 20 MEQ tablet Take 1 tablet (20 mEq total) by mouth daily. 30 tablet 0   tiotropium (SPIRIVA HANDIHALER) 18 MCG inhalation capsule Place 1 capsule (18 mcg total) into inhaler and inhale daily. 30 capsule 2   No current facility-administered medications for this visit.    REVIEW OF SYSTEMS:  [X]  denotes positive finding, [ ]  denotes negative finding Cardiac  Comments:  Chest pain or chest pressure:    Shortness of breath upon exertion: x   Short of breath when lying flat:    Irregular heart rhythm:        Vascular    Pain in calf, thigh, or hip brought on by ambulation:    Pain in feet at night that wakes you up from your sleep:     Blood clot in your veins:    Leg swelling:         Pulmonary    Oxygen at home: x   Productive cough:     Wheezing:         Neurologic    Sudden weakness in arms or legs:     Sudden numbness in arms or legs:     Sudden onset of difficulty speaking or slurred speech:    Temporary loss of vision in one eye:     Problems with dizziness:         Gastrointestinal    Blood in stool:     Vomited blood:         Genitourinary    Burning when urinating:     Blood in urine:        Psychiatric    Major depression:         Hematologic    Bleeding problems:    Problems with blood clotting too easily:        Skin    Rashes or ulcers:        Constitutional    Fever or chills:     PHYSICAL EXAM:   Vitals:   03/16/22 0900  BP: (!) 111/54  Pulse: 69  Resp: 20  Temp: 97.7 F (36.5 C)  SpO2: (!) 87%  Weight: 220 lb (99.8 kg)  Height: 5\' 9"  (1.753 m)    GENERAL: The patient is a well-nourished male, in no acute distress. The vital signs are documented  above. CARDIAC: There is a regular rate and rhythm.  VASCULAR: I do not detect carotid bruits. I was unable to assess his femoral pulses as he is in a wheelchair. He has palpable popliteal and dorsalis pedis pulses bilaterally. He has bilateral lower extremity swelling. PULMONARY: There is good air exchange bilaterally without wheezing or rales. ABDOMEN: Soft and non-tender with normal pitched bowel sounds.  I could not palpate his aneurysm MUSCULOSKELETAL: There are no major deformities or cyanosis. NEUROLOGIC: No focal weakness or paresthesias are detected. SKIN: There are no ulcers or rashes noted. PSYCHIATRIC: The patient has a normal affect.  DATA:    DUPLEX ABDOMINAL AORTA: I have independently interpreted his duplex of the abdominal aorta.  Today the maximum diameter of his infrarenal aorta is 4.4 cm which has not changed in the last 6 months.  The right common iliac artery measures 1.9 cm in maximum diameter.  The left common iliac artery measures 1.5 cm in maximum diameter.  These 2 have not changed significantly.  Deitra Mayo [facility] [phone]

## 2022-03-17 ENCOUNTER — Other Ambulatory Visit: Payer: Self-pay

## 2022-03-17 DIAGNOSIS — I7143 Infrarenal abdominal aortic aneurysm, without rupture: Secondary | ICD-10-CM

## 2022-03-20 ENCOUNTER — Other Ambulatory Visit: Payer: Self-pay | Admitting: Surgical

## 2022-03-20 ENCOUNTER — Telehealth: Payer: Self-pay | Admitting: Orthopedic Surgery

## 2022-03-20 MED ORDER — OXYCODONE HCL 5 MG PO TABS: 5.0000 mg | ORAL_TABLET | Freq: Every day | ORAL | 0 refills | Status: DC | PRN

## 2022-03-21 DIAGNOSIS — J449 Chronic obstructive pulmonary disease, unspecified: Secondary | ICD-10-CM | POA: Diagnosis not present

## 2022-04-12 DIAGNOSIS — I719 Aortic aneurysm of unspecified site, without rupture: Secondary | ICD-10-CM | POA: Diagnosis not present

## 2022-04-12 DIAGNOSIS — J449 Chronic obstructive pulmonary disease, unspecified: Secondary | ICD-10-CM | POA: Diagnosis not present

## 2022-04-12 DIAGNOSIS — Z79631 Long term (current) use of antimetabolite agent: Secondary | ICD-10-CM | POA: Diagnosis not present

## 2022-04-12 DIAGNOSIS — Z7901 Long term (current) use of anticoagulants: Secondary | ICD-10-CM | POA: Diagnosis not present

## 2022-04-12 DIAGNOSIS — I4891 Unspecified atrial fibrillation: Secondary | ICD-10-CM | POA: Diagnosis not present

## 2022-04-12 DIAGNOSIS — I130 Hypertensive heart and chronic kidney disease with heart failure and stage 1 through stage 4 chronic kidney disease, or unspecified chronic kidney disease: Secondary | ICD-10-CM | POA: Diagnosis not present

## 2022-04-12 DIAGNOSIS — I700 Atherosclerosis of aorta: Secondary | ICD-10-CM | POA: Diagnosis not present

## 2022-04-12 DIAGNOSIS — Z801 Family history of malignant neoplasm of trachea, bronchus and lung: Secondary | ICD-10-CM | POA: Diagnosis not present

## 2022-04-12 DIAGNOSIS — Z85118 Personal history of other malignant neoplasm of bronchus and lung: Secondary | ICD-10-CM | POA: Diagnosis not present

## 2022-04-12 DIAGNOSIS — J9610 Chronic respiratory failure, unspecified whether with hypoxia or hypercapnia: Secondary | ICD-10-CM | POA: Diagnosis not present

## 2022-04-12 DIAGNOSIS — I509 Heart failure, unspecified: Secondary | ICD-10-CM | POA: Diagnosis not present

## 2022-04-12 DIAGNOSIS — D6869 Other thrombophilia: Secondary | ICD-10-CM | POA: Diagnosis not present

## 2022-04-12 DIAGNOSIS — Z9981 Dependence on supplemental oxygen: Secondary | ICD-10-CM | POA: Diagnosis not present

## 2022-04-12 DIAGNOSIS — Z86718 Personal history of other venous thrombosis and embolism: Secondary | ICD-10-CM | POA: Diagnosis not present

## 2022-04-12 DIAGNOSIS — Z96649 Presence of unspecified artificial hip joint: Secondary | ICD-10-CM | POA: Diagnosis not present

## 2022-04-12 DIAGNOSIS — I4892 Unspecified atrial flutter: Secondary | ICD-10-CM | POA: Diagnosis not present

## 2022-04-12 DIAGNOSIS — Z833 Family history of diabetes mellitus: Secondary | ICD-10-CM | POA: Diagnosis not present

## 2022-04-12 DIAGNOSIS — Z7951 Long term (current) use of inhaled steroids: Secondary | ICD-10-CM | POA: Diagnosis not present

## 2022-04-12 DIAGNOSIS — Z8249 Family history of ischemic heart disease and other diseases of the circulatory system: Secondary | ICD-10-CM | POA: Diagnosis not present

## 2022-04-12 DIAGNOSIS — Z825 Family history of asthma and other chronic lower respiratory diseases: Secondary | ICD-10-CM | POA: Diagnosis not present

## 2022-04-20 DIAGNOSIS — M0579 Rheumatoid arthritis with rheumatoid factor of multiple sites without organ or systems involvement: Secondary | ICD-10-CM | POA: Diagnosis not present

## 2022-04-20 DIAGNOSIS — Z79899 Other long term (current) drug therapy: Secondary | ICD-10-CM | POA: Diagnosis not present

## 2022-04-20 DIAGNOSIS — J449 Chronic obstructive pulmonary disease, unspecified: Secondary | ICD-10-CM | POA: Diagnosis not present

## 2022-05-19 ENCOUNTER — Ambulatory Visit (INDEPENDENT_AMBULATORY_CARE_PROVIDER_SITE_OTHER): Payer: Medicare PPO

## 2022-05-19 ENCOUNTER — Ambulatory Visit: Payer: Medicare PPO | Admitting: Orthopedic Surgery

## 2022-05-19 ENCOUNTER — Encounter: Payer: Self-pay | Admitting: Orthopedic Surgery

## 2022-05-19 DIAGNOSIS — G8918 Other acute postprocedural pain: Secondary | ICD-10-CM

## 2022-05-19 NOTE — Progress Notes (Signed)
Office Visit Note   Patient: Jeffrey Stephens           Date of Birth: 1941-03-17           MRN: [id number] Visit Date: 05/19/2022 Requested by: Lavone Orn, MD [address] [place],  [place] [address] PCP: Lavone Orn, MD  Subjective: Chief Complaint  Patient presents with   Right Leg - Pain    HPI: [name] is a 81 year old patient with right distal thigh pain.  Had intertrochanteric/subtrochanteric femur fracture treated with IM nail in April.  Has some pain with standing and it does hurt him to walk.  He is getting a motorized wheelchair.  Has multiple medical comorbidities.  Pain does not wake him from sleep at night.  Denies any back pain or radicular symptoms.  Localizes the pain realistically to the lateral aspect of the distal femur.              ROS: All systems reviewed are negative as they relate to the chief complaint within the history of present illness.  Patient denies  fevers or chills.   Assessment & Plan: Visit Diagnoses:  1. Post-op pain     Plan: Impression is right leg pain with normal radiographs and healed hip fracture.  No real intervention required.  Nail is well centered in the distal femur.  No hardware breakage.  No evidence of infection.  Unclear etiology of the pain but this is something that does not require any further orthopedic action at this time.  Follow-up as needed.  Follow-Up Instructions: Return if symptoms worsen or fail to improve.   Orders:  Orders Placed This Encounter  Procedures   XR FEMUR, MIN 2 VIEWS RIGHT   No orders of the defined types were placed in this encounter.     Procedures: No procedures performed   Clinical Data: No additional findings.  Objective: Vital Signs: There were no vitals taken for this visit.  Physical Exam:   Constitutional: Patient appears well-developed HEENT:  Head: Normocephalic Eyes:EOM are normal Neck: Normal range of motion Cardiovascular: Normal  rate Pulmonary/chest: Effort normal Neurologic: Patient is alert Skin: Skin is warm Psychiatric: Patient has normal mood and affect   Ortho Exam: Ortho exam demonstrates well-healed interlocking screw incisions on the right-hand side.  No knee effusion.  No groin pain with internal/external Tatian of the right leg.  Does have pitting edema bilateral lower extremities in the calf region.  Hip flexion strength intact but slightly less on the right compared to the left.  Specialty Comments:  No specialty comments available.  Imaging: XR FEMUR, MIN 2 VIEWS RIGHT  Result Date: 05/19/2022 AP lateral radiographs right femur reviewed.  Intramedullary rod for subtrochanteric femur fracture in good position alignment with good healing of the fracture.  Distally 1 transverse locking screw is present with no complicating features.  Nail is well centered in the middle of the femur distally.    PMFS History: Patient Active Problem List   Diagnosis Date Noted   Arthritis of right knee    Acute metabolic encephalopathy 28/36/6294   CAP (community acquired pneumonia) 01/21/2022   Atrial fibrillation with rapid ventricular response ([place]) 01/21/2022   COPD exacerbation ([place]) 01/21/2022   CKD stage G3a/A1, GFR 45-59 and albumin creatinine ratio <30 mg/g (HCC) 01/21/2022   Hyponatremia 01/21/2022   Pneumonia due to virus 01/21/2022   Acute pulmonary edema ([place]) 01/21/2022   Acute respiratory failure ([place]) 01/20/2022   Hip fracture ([place]) 01/02/2022  Leukocytosis 01/02/2022   Normocytic anemia 01/02/2022   History of DVT (deep vein thrombosis) 01/02/2022   Acute on chronic respiratory failure with hypoxemia due to rhinovirus pneumonia ([place]) 03/22/2021   COPD with emphysema ([place]) 03/03/2020   Aortic atherosclerosis ([place]) 08/30/2018   Long term current use of anticoagulant 01/18/2017   Lung cancer, lower lobe ([place]) 05/08/2016   Anticoagulation adequate 08/14/2014   Paroxysmal atrial flutter ([place])  08/12/2014   Atrial flutter ([place]) 08/12/2014   Abnormal nuclear cardiac imaging test 07/13/2014   Coronary artery calcification seen on CT scan 06/17/2014   Atrial flutter with rapid ventricular response, converted with sotalol 06/11/2014   Left knee DJD 05/14/2014   Abdominal aortic aneurysm ([place]) 07/31/2008   Pulmonary nodules 07/31/2008   Rheumatoid arthritis ([place]) 07/31/2008   SKIN CANCER, HX OF 07/31/2008   NEPHROLITHIASIS, HX OF 07/31/2008   BENIGN PROSTATIC HYPERTROPHY, HX OF 07/31/2008   Past Medical History:  Diagnosis Date   AAA (abdominal aortic aneurysm) ([place])    a. followed by Dr. Deitra Mayo - 01/2016 CT: 3.6 cm infrarenal AAA.   Abnormal nuclear cardiac imaging test 07/13/2014   a. 06/2014 MV: possible reversible inferior wall defect,no wma, nl EF, felt to be artifact.   Basal cell carcinoma    "LLE; some on my head"   BPH (benign prostatic hyperplasia)    Cerebral aneurysm 1994   Constipation    Depression    DVT (deep venous thrombosis) ([place]) 2001   Hx of Left leg    Dysrhythmia    AFIB    Frequency of urination    GERD (gastroesophageal reflux disease)    History of echocardiogram    a. 2D ECHO: 06/11/2014: EF 55-60%. Normal wall thickness. Indeterminate DD ( atrial flutter ). No RWMA. Mild LA dilation. Normal RV size and systolic function. No significant valvular abnormalities.   History of palpitations    OCCASIONAL   Kidney stones    Lung nodule    a. 02/2016 PET scan: intensely hypermetabolic 2.0 x 1.5 cm central RLL nodule (SUV 23).   Paroxysmal atrial flutter ([place])    a. On eliquis and sotalol (CHA2DS2VASc = 1).   Rheumatoid arthritis ([place])    "qwhere"   Stroke [facility]) 1994   "when I had ruptured aneurysm in my head"   TIA (transient ischemic attack)    1994 WITH ANEYRYSM    Family History  Problem Relation Age of Onset   Heart disease Father        Aneurysm   Diabetes Father    Heart attack Father    Heart disease Brother        Heart  Disease before age 66   Cancer Brother    Hypertension Neg Hx    Stroke Neg Hx     Past Surgical History:  Procedure Laterality Date   BASAL CELL CARCINOMA EXCISION     "LLE; top of my head"   CEREBRAL ANEURYSM REPAIR  1994   Hx of ruptured brain aneurysm Tx by Dr. Ellene Route   CYSTOSCOPY W/ STONE MANIPULATION  1980's X 1   EXCISIONAL HEMORRHOIDECTOMY  1970's   INTRAMEDULLARY (IM) NAIL INTERTROCHANTERIC Right 01/03/2022   Procedure: INTRAMEDULLARY (IM) NAIL INTERTROCHANTRIC; ASPIRATION OF RIGHT KNEE EFFUSION AND INJECTION.;  Surgeon: Meredith Pel, MD;  Location: [facility];  Service: Orthopedics;  Laterality: Right;   JOINT REPLACEMENT     LT KNEE   TOTAL KNEE ARTHROPLASTY Left 05/14/2014   Procedure: LEFT TOTAL KNEE ARTHROPLASTY;  Surgeon:  Johnn Hai, MD;  Location: [facility];  Service: Orthopedics;  Laterality: Left;   VIDEO ASSISTED THORACOSCOPY (VATS)/WEDGE RESECTION Right 05/08/2016   Procedure: VIDEO ASSISTED THORACOSCOPY (VATS)/LUNG RESECTION;  Surgeon: Ivin Poot, MD;  Location: [facility] OR;  Service: Thoracic;  Laterality: Right;   Social History   Occupational History   Not on file  Tobacco Use   Smoking status: Former    Packs/day: 2.50    Years: 37.00    Total pack years: 92.50    Types: Cigarettes    Quit date: 09/25/1992    Years since quitting: 29.6   Smokeless tobacco: Never  Vaping Use   Vaping Use: Never used  Substance and Sexual Activity   Alcohol use: No    Alcohol/week: 0.0 standard drinks of alcohol   Drug use: No   Sexual activity: Not Currently

## 2022-05-21 DIAGNOSIS — J449 Chronic obstructive pulmonary disease, unspecified: Secondary | ICD-10-CM | POA: Diagnosis not present

## 2022-06-08 ENCOUNTER — Ambulatory Visit
Admission: RE | Admit: 2022-06-08 | Discharge: 2022-06-08 | Disposition: A | Discharge status: Home / Self Care | Payer: Medicare PPO | Source: Ambulatory Visit | Attending: Emergency Medicine | Admitting: Emergency Medicine

## 2022-06-08 DIAGNOSIS — I700 Atherosclerosis of aorta: Secondary | ICD-10-CM | POA: Diagnosis not present

## 2022-06-08 DIAGNOSIS — R918 Other nonspecific abnormal finding of lung field: Secondary | ICD-10-CM

## 2022-06-08 DIAGNOSIS — Z85110 Personal history of malignant carcinoid tumor of bronchus and lung: Secondary | ICD-10-CM | POA: Diagnosis not present

## 2022-06-08 DIAGNOSIS — R911 Solitary pulmonary nodule: Secondary | ICD-10-CM | POA: Diagnosis not present

## 2022-06-08 DIAGNOSIS — J90 Pleural effusion, not elsewhere classified: Secondary | ICD-10-CM | POA: Diagnosis not present

## 2022-06-08 DIAGNOSIS — J439 Emphysema, unspecified: Secondary | ICD-10-CM | POA: Diagnosis not present

## 2022-06-10 ENCOUNTER — Emergency Department (HOSPITAL_COMMUNITY): Payer: Medicare PPO

## 2022-06-10 ENCOUNTER — Other Ambulatory Visit: Payer: Self-pay

## 2022-06-10 ENCOUNTER — Encounter (HOSPITAL_COMMUNITY): Payer: Self-pay | Admitting: Emergency Medicine

## 2022-06-10 ENCOUNTER — Inpatient Hospital Stay (HOSPITAL_COMMUNITY)
Admission: EM | Admit: 2022-06-10 | Discharge: 2022-06-19 | DRG: 260 | Disposition: A | Discharge status: Hospice | Payer: Medicare PPO | Attending: Internal Medicine | Admitting: Internal Medicine

## 2022-06-10 DIAGNOSIS — N1832 Chronic kidney disease, stage 3b: Secondary | ICD-10-CM | POA: Diagnosis present

## 2022-06-10 DIAGNOSIS — I50812 Chronic right heart failure: Secondary | ICD-10-CM

## 2022-06-10 DIAGNOSIS — D631 Anemia in chronic kidney disease: Secondary | ICD-10-CM | POA: Diagnosis present

## 2022-06-10 DIAGNOSIS — I2510 Atherosclerotic heart disease of native coronary artery without angina pectoris: Secondary | ICD-10-CM | POA: Diagnosis present

## 2022-06-10 DIAGNOSIS — I441 Atrioventricular block, second degree: Secondary | ICD-10-CM | POA: Diagnosis present

## 2022-06-10 DIAGNOSIS — N179 Acute kidney failure, unspecified: Secondary | ICD-10-CM | POA: Diagnosis present

## 2022-06-10 DIAGNOSIS — Z66 Do not resuscitate: Secondary | ICD-10-CM | POA: Diagnosis not present

## 2022-06-10 DIAGNOSIS — R55 Syncope and collapse: Secondary | ICD-10-CM | POA: Diagnosis present

## 2022-06-10 DIAGNOSIS — D72829 Elevated white blood cell count, unspecified: Secondary | ICD-10-CM | POA: Diagnosis not present

## 2022-06-10 DIAGNOSIS — R7989 Other specified abnormal findings of blood chemistry: Secondary | ICD-10-CM | POA: Diagnosis not present

## 2022-06-10 DIAGNOSIS — Z8249 Family history of ischemic heart disease and other diseases of the circulatory system: Secondary | ICD-10-CM

## 2022-06-10 DIAGNOSIS — L89322 Pressure ulcer of left buttock, stage 2: Secondary | ICD-10-CM | POA: Diagnosis present

## 2022-06-10 DIAGNOSIS — J9811 Atelectasis: Secondary | ICD-10-CM | POA: Diagnosis not present

## 2022-06-10 DIAGNOSIS — I480 Paroxysmal atrial fibrillation: Secondary | ICD-10-CM | POA: Diagnosis present

## 2022-06-10 DIAGNOSIS — D849 Immunodeficiency, unspecified: Secondary | ICD-10-CM | POA: Diagnosis present

## 2022-06-10 DIAGNOSIS — Z833 Family history of diabetes mellitus: Secondary | ICD-10-CM

## 2022-06-10 DIAGNOSIS — K7689 Other specified diseases of liver: Secondary | ICD-10-CM | POA: Diagnosis not present

## 2022-06-10 DIAGNOSIS — I130 Hypertensive heart and chronic kidney disease with heart failure and stage 1 through stage 4 chronic kidney disease, or unspecified chronic kidney disease: Secondary | ICD-10-CM | POA: Diagnosis present

## 2022-06-10 DIAGNOSIS — J9611 Chronic respiratory failure with hypoxia: Secondary | ICD-10-CM | POA: Diagnosis not present

## 2022-06-10 DIAGNOSIS — Z6836 Body mass index (BMI) 36.0-36.9, adult: Secondary | ICD-10-CM

## 2022-06-10 DIAGNOSIS — R000 Tachycardia, unspecified: Secondary | ICD-10-CM | POA: Diagnosis not present

## 2022-06-10 DIAGNOSIS — I2781 Cor pulmonale (chronic): Secondary | ICD-10-CM | POA: Diagnosis present

## 2022-06-10 DIAGNOSIS — R230 Cyanosis: Secondary | ICD-10-CM | POA: Diagnosis not present

## 2022-06-10 DIAGNOSIS — J9690 Respiratory failure, unspecified, unspecified whether with hypoxia or hypercapnia: Secondary | ICD-10-CM | POA: Diagnosis not present

## 2022-06-10 DIAGNOSIS — Z9981 Dependence on supplemental oxygen: Secondary | ICD-10-CM | POA: Diagnosis not present

## 2022-06-10 DIAGNOSIS — J9600 Acute respiratory failure, unspecified whether with hypoxia or hypercapnia: Secondary | ICD-10-CM | POA: Diagnosis not present

## 2022-06-10 DIAGNOSIS — I5082 Biventricular heart failure: Secondary | ICD-10-CM | POA: Diagnosis present

## 2022-06-10 DIAGNOSIS — J9621 Acute and chronic respiratory failure with hypoxia: Secondary | ICD-10-CM | POA: Diagnosis present

## 2022-06-10 DIAGNOSIS — Z87891 Personal history of nicotine dependence: Secondary | ICD-10-CM

## 2022-06-10 DIAGNOSIS — J432 Centrilobular emphysema: Secondary | ICD-10-CM | POA: Diagnosis not present

## 2022-06-10 DIAGNOSIS — I483 Typical atrial flutter: Secondary | ICD-10-CM | POA: Diagnosis present

## 2022-06-10 DIAGNOSIS — R0602 Shortness of breath: Secondary | ICD-10-CM | POA: Diagnosis not present

## 2022-06-10 DIAGNOSIS — Z8781 Personal history of (healed) traumatic fracture: Secondary | ICD-10-CM

## 2022-06-10 DIAGNOSIS — E669 Obesity, unspecified: Secondary | ICD-10-CM | POA: Diagnosis present

## 2022-06-10 DIAGNOSIS — I951 Orthostatic hypotension: Secondary | ICD-10-CM | POA: Diagnosis present

## 2022-06-10 DIAGNOSIS — I5033 Acute on chronic diastolic (congestive) heart failure: Secondary | ICD-10-CM | POA: Diagnosis not present

## 2022-06-10 DIAGNOSIS — Z7951 Long term (current) use of inhaled steroids: Secondary | ICD-10-CM

## 2022-06-10 DIAGNOSIS — Z7189 Other specified counseling: Secondary | ICD-10-CM | POA: Diagnosis not present

## 2022-06-10 DIAGNOSIS — Z8673 Personal history of transient ischemic attack (TIA), and cerebral infarction without residual deficits: Secondary | ICD-10-CM

## 2022-06-10 DIAGNOSIS — R4182 Altered mental status, unspecified: Secondary | ICD-10-CM | POA: Diagnosis not present

## 2022-06-10 DIAGNOSIS — M069 Rheumatoid arthritis, unspecified: Secondary | ICD-10-CM | POA: Diagnosis present

## 2022-06-10 DIAGNOSIS — Z85118 Personal history of other malignant neoplasm of bronchus and lung: Secondary | ICD-10-CM

## 2022-06-10 DIAGNOSIS — Z86718 Personal history of other venous thrombosis and embolism: Secondary | ICD-10-CM

## 2022-06-10 DIAGNOSIS — E1122 Type 2 diabetes mellitus with diabetic chronic kidney disease: Secondary | ICD-10-CM | POA: Diagnosis present

## 2022-06-10 DIAGNOSIS — Z8701 Personal history of pneumonia (recurrent): Secondary | ICD-10-CM

## 2022-06-10 DIAGNOSIS — R0689 Other abnormalities of breathing: Secondary | ICD-10-CM | POA: Diagnosis not present

## 2022-06-10 DIAGNOSIS — J439 Emphysema, unspecified: Secondary | ICD-10-CM | POA: Diagnosis not present

## 2022-06-10 DIAGNOSIS — R7982 Elevated C-reactive protein (CRP): Secondary | ICD-10-CM | POA: Diagnosis not present

## 2022-06-10 DIAGNOSIS — Z7901 Long term (current) use of anticoagulants: Secondary | ICD-10-CM

## 2022-06-10 DIAGNOSIS — I4819 Other persistent atrial fibrillation: Secondary | ICD-10-CM | POA: Diagnosis not present

## 2022-06-10 DIAGNOSIS — Z7401 Bed confinement status: Secondary | ICD-10-CM | POA: Diagnosis not present

## 2022-06-10 DIAGNOSIS — Z515 Encounter for palliative care: Secondary | ICD-10-CM | POA: Diagnosis not present

## 2022-06-10 DIAGNOSIS — Z79631 Long term (current) use of antimetabolite agent: Secondary | ICD-10-CM

## 2022-06-10 DIAGNOSIS — I7143 Infrarenal abdominal aortic aneurysm, without rupture: Secondary | ICD-10-CM | POA: Diagnosis present

## 2022-06-10 DIAGNOSIS — J441 Chronic obstructive pulmonary disease with (acute) exacerbation: Secondary | ICD-10-CM | POA: Diagnosis not present

## 2022-06-10 DIAGNOSIS — Z5329 Procedure and treatment not carried out because of patient's decision for other reasons: Secondary | ICD-10-CM | POA: Diagnosis not present

## 2022-06-10 DIAGNOSIS — Z87442 Personal history of urinary calculi: Secondary | ICD-10-CM

## 2022-06-10 DIAGNOSIS — R0902 Hypoxemia: Secondary | ICD-10-CM | POA: Diagnosis not present

## 2022-06-10 DIAGNOSIS — K219 Gastro-esophageal reflux disease without esophagitis: Secondary | ICD-10-CM | POA: Diagnosis present

## 2022-06-10 DIAGNOSIS — Z79899 Other long term (current) drug therapy: Secondary | ICD-10-CM

## 2022-06-10 DIAGNOSIS — Z96652 Presence of left artificial knee joint: Secondary | ICD-10-CM | POA: Diagnosis present

## 2022-06-10 DIAGNOSIS — L8990 Pressure ulcer of unspecified site, unspecified stage: Secondary | ICD-10-CM | POA: Insufficient documentation

## 2022-06-10 DIAGNOSIS — Z85828 Personal history of other malignant neoplasm of skin: Secondary | ICD-10-CM

## 2022-06-10 DIAGNOSIS — R32 Unspecified urinary incontinence: Secondary | ICD-10-CM | POA: Diagnosis present

## 2022-06-10 DIAGNOSIS — N400 Enlarged prostate without lower urinary tract symptoms: Secondary | ICD-10-CM | POA: Diagnosis present

## 2022-06-10 DIAGNOSIS — Z902 Acquired absence of lung [part of]: Secondary | ICD-10-CM

## 2022-06-10 LAB — CBC WITH DIFFERENTIAL/PLATELET
Abs Immature Granulocytes: 0.02 10*3/uL (ref 0.00–0.07)
Basophils Absolute: 0 10*3/uL (ref 0.0–0.1)
Basophils Relative: 0 %
Eosinophils Absolute: 0.2 10*3/uL (ref 0.0–0.5)
Eosinophils Relative: 2 %
HCT: 34.7 % — ABNORMAL LOW (ref 39.0–52.0)
Hemoglobin: 10.2 g/dL — ABNORMAL LOW (ref 13.0–17.0)
Immature Granulocytes: 0 %
Lymphocytes Relative: 13 %
Lymphs Abs: 1.2 10*3/uL (ref 0.7–4.0)
MCH: 26.5 pg (ref 26.0–34.0)
MCHC: 29.4 g/dL — ABNORMAL LOW (ref 30.0–36.0)
MCV: 90.1 fL (ref 80.0–100.0)
Monocytes Absolute: 0.2 10*3/uL (ref 0.1–1.0)
Monocytes Relative: 2 %
Neutro Abs: 7.4 10*3/uL (ref 1.7–7.7)
Neutrophils Relative %: 83 %
Platelets: 331 10*3/uL (ref 150–400)
RBC: 3.85 MIL/uL — ABNORMAL LOW (ref 4.22–5.81)
RDW: 24.2 % — ABNORMAL HIGH (ref 11.5–15.5)
WBC: 8.9 10*3/uL (ref 4.0–10.5)
nRBC: 0 % (ref 0.0–0.2)

## 2022-06-10 LAB — TROPONIN I (HIGH SENSITIVITY)
Troponin I (High Sensitivity): 14 ng/L (ref <18)
Troponin I (High Sensitivity): 16 ng/L (ref <18)

## 2022-06-10 LAB — I-STAT CHEM 8, ED
BUN: 13 mg/dL (ref 8–23)
Calcium, Ion: 1.11 mmol/L — ABNORMAL LOW (ref 1.15–1.40)
Chloride: 99 mmol/L (ref 98–111)
Creatinine, Ser: 1.5 mg/dL — ABNORMAL HIGH (ref 0.61–1.24)
Glucose, Bld: 106 mg/dL — ABNORMAL HIGH (ref 70–99)
HCT: 36 % — ABNORMAL LOW (ref 39.0–52.0)
Hemoglobin: 12.2 g/dL — ABNORMAL LOW (ref 13.0–17.0)
Potassium: 3.7 mmol/L (ref 3.5–5.1)
Sodium: 138 mmol/L (ref 135–145)
TCO2: 29 mmol/L (ref 22–32)

## 2022-06-10 LAB — COMPREHENSIVE METABOLIC PANEL
ALT: 12 U/L (ref 0–44)
AST: 20 U/L (ref 15–41)
Albumin: 2.9 g/dL — ABNORMAL LOW (ref 3.5–5.0)
Alkaline Phosphatase: 38 U/L (ref 38–126)
Anion gap: 13 (ref 5–15)
BUN: 13 mg/dL (ref 8–23)
CO2: 28 mmol/L (ref 22–32)
Calcium: 8.9 mg/dL (ref 8.9–10.3)
Chloride: 99 mmol/L (ref 98–111)
Creatinine, Ser: 1.53 mg/dL — ABNORMAL HIGH (ref 0.61–1.24)
GFR, Estimated: 46 mL/min — ABNORMAL LOW (ref >60)
Glucose, Bld: 109 mg/dL — ABNORMAL HIGH (ref 70–99)
Potassium: 3.8 mmol/L (ref 3.5–5.1)
Sodium: 140 mmol/L (ref 135–145)
Total Bilirubin: 1.5 mg/dL — ABNORMAL HIGH (ref 0.3–1.2)
Total Protein: 6.3 g/dL — ABNORMAL LOW (ref 6.5–8.1)

## 2022-06-10 LAB — I-STAT ARTERIAL BLOOD GAS, ED
Acid-Base Excess: 5 mmol/L — ABNORMAL HIGH (ref 0.0–2.0)
Bicarbonate: 30.3 mmol/L — ABNORMAL HIGH (ref 20.0–28.0)
Calcium, Ion: 1.23 mmol/L (ref 1.15–1.40)
HCT: 34 % — ABNORMAL LOW (ref 39.0–52.0)
Hemoglobin: 11.6 g/dL — ABNORMAL LOW (ref 13.0–17.0)
O2 Saturation: 95 %
Patient temperature: 97.7
Potassium: 3.6 mmol/L (ref 3.5–5.1)
Sodium: 139 mmol/L (ref 135–145)
TCO2: 32 mmol/L (ref 22–32)
pCO2 arterial: 44.5 mmHg (ref 32–48)
pH, Arterial: 7.438 (ref 7.35–7.45)
pO2, Arterial: 71 mmHg — ABNORMAL LOW (ref 83–108)

## 2022-06-10 LAB — BRAIN NATRIURETIC PEPTIDE: B Natriuretic Peptide: 224.2 pg/mL — ABNORMAL HIGH (ref 0.0–100.0)

## 2022-06-10 LAB — PROTIME-INR
INR: 1.9 — ABNORMAL HIGH (ref 0.8–1.2)
Prothrombin Time: 21.7 seconds — ABNORMAL HIGH (ref 11.4–15.2)

## 2022-06-10 LAB — LACTIC ACID, PLASMA
Lactic Acid, Venous: 1.5 mmol/L (ref 0.5–1.9)
Lactic Acid, Venous: 1.1 mmol/L (ref 0.5–1.9)

## 2022-06-10 LAB — CBG MONITORING, ED: Glucose-Capillary: 102 mg/dL — ABNORMAL HIGH (ref 70–99)

## 2022-06-10 MED ORDER — IOHEXOL 350 MG/ML SOLN
80.0000 mL | Freq: Once | INTRAVENOUS | Status: AC | PRN
  Administered 2022-06-10: 80 mL via INTRAVENOUS

## 2022-06-10 NOTE — Clinical Note (Incomplete)
History and Physical    Jeffrey Stephens LNL:[id number] DOB: 1941/09/02 DOA: 06/10/2022  PCP: Lavone Orn, MD  Patient coming from: home  I have personally briefly reviewed patient's old medical records in [place]  Chief Complaint: syncope with hypoxemia  HPI: Jeffrey Stephens is a 80 y.o. male with medical history significant of atrial flutter on Eliquis, VTE, lung cancer s/p lobectomy 2017, RA on methotrexate, COPD home O2 4L who presents to ED s/p  episode of syncope associated with period of prolonged unresponsiveness . On ems arrival patient was noted to be hypoxic on his  4L [place]  70;s . Patient was  placed on on NRB.    ED Course:  On arrival to ED he was alert and oriented but still hypoxic requiring  Brief support with  bipap. Patient  eventually able to be weaned back on 4l.  ED work up notes negative CTH, CTPA , Agb normal PH.   NO sz like activity described by EMS or family. Patient per family had episode like this few days prior but had quick recovery. Review of Systems: As per HPI otherwise 10 point review of systems negative.   Past Medical History:  Diagnosis Date  . AAA (abdominal aortic aneurysm) ([place])    a. followed by Dr. Deitra Mayo - 01/2016 CT: 3.6 cm infrarenal AAA.  Marland Kitchen Abnormal nuclear cardiac imaging test 07/13/2014   a. 06/2014 MV: possible reversible inferior wall defect,no wma, nl EF, felt to be artifact.  . Basal cell carcinoma    "LLE; some on my head"  . BPH (benign prostatic hyperplasia)   . Cerebral aneurysm 1994  . Constipation   . Depression   . DVT (deep venous thrombosis) ([place]) 2001   Hx of Left leg   . Dysrhythmia    AFIB   . Frequency of urination   . GERD (gastroesophageal reflux disease)   . History of echocardiogram    a. 2D ECHO: 06/11/2014: EF 55-60%. Normal wall thickness. Indeterminate DD ( atrial flutter ). No RWMA. Mild LA dilation. Normal RV size and systolic function. No significant valvular abnormalities.  .  History of palpitations    OCCASIONAL  . Kidney stones   . Lung nodule    a. 02/2016 PET scan: intensely hypermetabolic 2.0 x 1.5 cm central RLL nodule (SUV 23).  . Paroxysmal atrial flutter ([place])    a. On eliquis and sotalol (CHA2DS2VASc = 1).  . Rheumatoid arthritis ([place])    "qwhere"  . Stroke [facility]) 1994   "when I had ruptured aneurysm in my head"  . TIA (transient ischemic attack)    1994 WITH ANEYRYSM    Past Surgical History:  Procedure Laterality Date  . BASAL CELL CARCINOMA EXCISION     "LLE; top of my head"  . CEREBRAL ANEURYSM REPAIR  1994   Hx of ruptured brain aneurysm Tx by Dr. Ellene Route  . CYSTOSCOPY W/ STONE MANIPULATION  1980's X 1  . EXCISIONAL HEMORRHOIDECTOMY  1970's  . INTRAMEDULLARY (IM) NAIL INTERTROCHANTERIC Right 01/03/2022   Procedure: INTRAMEDULLARY (IM) NAIL INTERTROCHANTRIC; ASPIRATION OF RIGHT KNEE EFFUSION AND INJECTION.;  Surgeon: Meredith Pel, MD;  Location: [facility];  Service: Orthopedics;  Laterality: Right;  . JOINT REPLACEMENT     LT KNEE  . TOTAL KNEE ARTHROPLASTY Left 05/14/2014   Procedure: LEFT TOTAL KNEE ARTHROPLASTY;  Surgeon: Johnn Hai, MD;  [facility];  Service: Orthopedics;  Laterality: Left;  Marland Kitchen VIDEO ASSISTED THORACOSCOPY (VATS)/WEDGE RESECTION Right 05/08/2016  Procedure: VIDEO ASSISTED THORACOSCOPY (VATS)/LUNG RESECTION;  Surgeon: Ivin Poot, MD;  Location: [facility];  Service: Thoracic;  Laterality: Right;     reports that he quit smoking about 29 years ago. His smoking use included cigarettes. He has a 92.50 pack-year smoking history. He has never used smokeless tobacco. He reports that he does not drink alcohol and does not use drugs.  No Known Allergies  Family History  Problem Relation Age of Onset  . Heart disease Father        Aneurysm  . Diabetes Father   . Heart attack Father   . Heart disease Brother        Heart Disease before age 11  . Cancer Brother   . Hypertension Neg Hx   . Stroke Neg Hx      Prior to Admission medications   Medication Sig Start Date End Date Taking? Authorizing Provider  acetaminophen (TYLENOL) 500 MG tablet Take 1,000 mg by mouth every 6 (six) hours as needed for moderate pain.   Yes [provider]  albuterol (VENTOLIN HFA) 108 (90 Base) MCG/ACT inhaler TAKE 2 PUFFS BY MOUTH EVERY 6 HOURS AS NEEDED FOR WHEEZE OR SHORTNESS OF BREATH Patient taking differently: Inhale 2 puffs into the lungs every 6 (six) hours as needed for shortness of breath or wheezing. 09/20/21  Yes Collene Gobble, MD  budesonide-formoterol [facility]) 160-4.5 MCG/ACT inhaler Inhale 2 puffs into the lungs 2 (two) times daily. 01/27/22  Yes Ghimire, Henreitta Leber, MD  tiotropium (SPIRIVA HANDIHALER) 18 MCG inhalation capsule Place 1 capsule (18 mcg total) into inhaler and inhale daily. 01/27/22 01/27/23 Yes Ghimire, Henreitta Leber, MD  albuterol (PROVENTIL) (2.5 MG/3ML) 0.083% nebulizer solution Take 3 mLs (2.5 mg total) by nebulization every 6 (six) hours as needed for wheezing or shortness of breath. 02/28/22   Collene Gobble, MD  Cholecalciferol (VITAMIN D3 PO) Take 2,000 mcg by mouth in the morning and at bedtime.    [provider]  diltiazem (CARDIZEM CD) 180 MG 24 hr capsule Take 1 capsule (180 mg total) by mouth daily. 01/09/22 04/09/22  Barb Merino, MD  ELIQUIS 5 MG TABS tablet TAKE 1 TABLET BY MOUTH TWICE A DAY Patient taking differently: Take 5 mg by mouth 2 (two) times daily. 11/21/21   Croitoru, Mihai, MD  folic acid (FOLVITE) 1 MG tablet Take 1 mg by mouth every morning.     [provider]  furosemide (LASIX) 40 MG tablet Take 1 tablet (40 mg total) by mouth daily. 01/28/22   Ghimire, Henreitta Leber, MD  ipratropium-albuterol (DUONEB) 0.5-2.5 (3) MG/3ML SOLN Take 3 mLs by nebulization every 6 (six) hours as needed. 01/27/22   Ghimire, Henreitta Leber, MD  lactulose (CHRONULAC) 10 GM/15ML solution Take 30 mLs (20 g total) by mouth 2 (two) times daily. Patient taking differently: Take  20 g by mouth daily. 01/08/22   Barb Merino, MD  lansoprazole (PREVACID) 30 MG capsule Take 30 mg by mouth every morning.    [provider]  methocarbamol (ROBAXIN) 500 MG tablet Take 1 tablet (500 mg total) by mouth every 6 (six) hours as needed for muscle spasms. 01/06/22   Meredith Pel, MD  methotrexate (RHEUMATREX) 5 MG tablet Take 10 mg by mouth every Monday. Caution: Chemotherapy. Protect from light.    [provider]  Multiple Vitamins-Minerals (CENTRUM SILVER ADULT 50+) TABS Take 50 mg by mouth daily.    [provider]  oxyCODONE (OXY IR/ROXICODONE) 5 MG immediate release  tablet Take 1 tablet (5 mg total) by mouth daily as needed for severe pain. 03/20/22   Magnant, Charles L, PA-C  potassium chloride SA (KLOR-CON M) 20 MEQ tablet Take 1 tablet (20 mEq total) by mouth daily. 01/27/22   Jonetta Osgood, MD    Physical Exam: Vitals:   06/10/22 2000 06/10/22 2118 06/10/22 2130 06/10/22 2200  BP: 122/76 116/68 117/70 111/62  Pulse: 88 88 88 88  Resp: 15 19 19 15   Temp:      TempSrc:      SpO2: 100% 97% 94% 96%  Weight:      Height:        Constitutional: NAD, calm, comfortable Vitals:   06/10/22 2000 06/10/22 2118 06/10/22 2130 06/10/22 2200  BP: 122/76 116/68 117/70 111/62  Pulse: 88 88 88 88  Resp: 15 19 19 15   Temp:      TempSrc:      SpO2: 100% 97% 94% 96%  Weight:      Height:       Eyes: PERRL, lids and conjunctivae normal ENMT: Mucous membranes are moist. Posterior pharynx clear of any exudate or lesions.Normal dentition.  Neck: normal, supple, no masses, no thyromegaly Respiratory: clear to auscultation bilaterally, no wheezing, no crackles. Normal respiratory effort. No accessory muscle use.  Cardiovascular: Regular rate and rhythm, no murmurs / rubs / gallops. No extremity edema. 2+ pedal pulses. No carotid bruits.  Abdomen: no tenderness, no masses palpated. No hepatosplenomegaly. Bowel sounds positive.  Musculoskeletal: no  clubbing / cyanosis. No joint deformity upper and lower extremities. Good ROM, no contractures. Normal muscle tone.  Skin: no rashes, lesions, ulcers. No induration Neurologic: CN 2-12 grossly intact. Sensation intact, DTR normal. Strength 5/5 in all 4.  Psychiatric: Normal judgment and insight. Alert and oriented x 3. Normal mood.    Labs on Admission: I have personally reviewed following labs and imaging studies  CBC: Recent Labs  Lab 06/10/22 1836 06/10/22 1855 06/10/22 1858  WBC 8.9  --   --   NEUTROABS 7.4  --   --   HGB 10.2* 11.6* 12.2*  HCT 34.7* 34.0* 36.0*  MCV 90.1  --   --   PLT 331  --   --    Basic Metabolic Panel: Recent Labs  Lab 06/10/22 1836 06/10/22 1855 06/10/22 1858  NA 140 139 138  K 3.8 3.6 3.7  CL 99  --  99  CO2 28  --   --   GLUCOSE 109*  --  106*  BUN 13  --  13  CREATININE 1.53*  --  1.50*  CALCIUM 8.9  --   --    GFR: Estimated Creatinine Clearance: 47.5 mL/min (A) (by C-G formula based on SCr of 1.5 mg/dL (H)). Liver Function Tests: Recent Labs  Lab 06/10/22 1836  AST 20  ALT 12  ALKPHOS 38  BILITOT 1.5*  PROT 6.3*  ALBUMIN 2.9*   No results for input(s): "LIPASE", "AMYLASE" in the last 168 hours. No results for input(s): "AMMONIA" in the last 168 hours. Coagulation Profile: Recent Labs  Lab 06/10/22 1836  INR 1.9*   Cardiac Enzymes: No results for input(s): "CKTOTAL", "CKMB", "CKMBINDEX", "TROPONINI" in the last 168 hours. BNP (last 3 results) No results for input(s): "PROBNP" in the last 8760 hours. HbA1C: No results for input(s): "HGBA1C" in the last 72 hours. CBG: Recent Labs  Lab 06/10/22 1835  GLUCAP 102*   Lipid Profile: No results for input(s): "CHOL", "HDL", "LDLCALC", "TRIG", "  CHOLHDL", "LDLDIRECT" in the last 72 hours. Thyroid Function Tests: No results for input(s): "TSH", "T4TOTAL", "FREET4", "T3FREE", "THYROIDAB" in the last 72 hours. Anemia Panel: No results for input(s): "VITAMINB12", "FOLATE",  "FERRITIN", "TIBC", "IRON", "RETICCTPCT" in the last 72 hours. Urine analysis:    Component Value Date/Time   COLORURINE AMBER (A) 01/20/2022 0739   APPEARANCEUR HAZY (A) 01/20/2022 0739   LABSPEC 1.021 01/20/2022 0739   PHURINE 5.0 01/20/2022 0739   GLUCOSEU NEGATIVE 01/20/2022 0739   HGBUR MODERATE (A) 01/20/2022 0739   BILIRUBINUR NEGATIVE 01/20/2022 0739   BILIRUBINUR small (A) 01/26/2016 1119   KETONESUR NEGATIVE 01/20/2022 0739   PROTEINUR 30 (A) 01/20/2022 0739   UROBILINOGEN 0.2 01/26/2016 1119   UROBILINOGEN 0.2 05/07/2014 0905   NITRITE NEGATIVE 01/20/2022 0739   LEUKOCYTESUR NEGATIVE 01/20/2022 0739    Radiological Exams on Admission: CT Angio Chest PE W and/or Wo Contrast  Result Date: 06/10/2022 CLINICAL DATA:  Positive D-dimer. EXAM: CT ANGIOGRAPHY CHEST WITH CONTRAST TECHNIQUE: Multidetector CT imaging of the chest was performed using the standard protocol during bolus administration of intravenous contrast. Multiplanar CT image reconstructions and MIPs were obtained to evaluate the vascular anatomy. RADIATION DOSE REDUCTION: This exam was performed according to the departmental dose-optimization program which includes automated exposure control, adjustment of the mA and/or kV according to patient size and/or use of iterative reconstruction technique. CONTRAST:  68mL OMNIPAQUE IOHEXOL 350 MG/ML SOLN COMPARISON:  Chest CT 02/23/2022 FINDINGS: Cardiovascular: Satisfactory opacification of the pulmonary arteries to the segmental level. No evidence of pulmonary embolism. Normal heart size. No pericardial effusion. There are atherosclerotic calcifications of the aorta and coronary arteries. Mediastinum/Nodes: There surgical clips in the right hilar region. There are nonenlarged and enlarged right hilar lymph nodes measuring up to 16 mm. This appears unchanged. No new enlarged lymph nodes are seen. Visualized thyroid gland and esophagus are within normal limits. Lungs/Pleura: Severe  emphysematous changes are again seen. There are small bilateral pleural effusions which are similar to the prior examination. There is some ground-glass opacities in the bilateral lower lungs, also unchanged. Scarring along the right major fissure is unchanged from prior. There is no new focal lung infiltrate or pneumothorax. Upper Abdomen: 4 cm left renal cyst is partially visualized. Rounded hyperdensity in the left kidney is too small to characterize and unchanged, possibly a complex cysts. Small right renal cyst is not well seen secondary to artifact. Musculoskeletal: No chest wall abnormality. No acute or significant osseous findings. Review of the MIP images confirms the above findings. IMPRESSION: 1. No evidence for pulmonary embolism. 2. Stable small pleural effusions. 3. Stable ground-glass opacities in the bilateral lower lungs which may represent infection/inflammation or edema. 4. Stable severe emphysema. 5. Multiple lesions, including 4 cm left Bosniak 1 benign simple cyst. No follow-up imaging is recommended. JACR 2018 Feb; 264-273, Management of the Incidental RenalMass on CT, [employer] 2021; 814-848, Bosniak Classification of Cystic Renal Masses, Version 2019. Aortic Atherosclerosis (ICD10-I70.0) and Emphysema (ICD10-J43.9). Electronically Signed   By: Ronney Asters M.D.   On: 06/10/2022 23:04   CT Head Wo Contrast  Result Date: 06/10/2022 CLINICAL DATA:  Altered mental status EXAM: CT HEAD WITHOUT CONTRAST TECHNIQUE: Contiguous axial images were obtained from the base of the skull through the vertex without intravenous contrast. RADIATION DOSE REDUCTION: This exam was performed according to the departmental dose-optimization program which includes automated exposure control, adjustment of the mA and/or kV according to patient size and/or use of iterative reconstruction technique. COMPARISON:  None Available.  FINDINGS: Brain: No evidence of acute infarction, hemorrhage, hydrocephalus,  extra-axial collection or mass lesion/mass effect. Chronic atrophic changes are noted as well as chronic white matter ischemic change. Findings of prior aneurysm clipping noted in the posterior fossa on the left. Vascular: No hyperdense vessel or unexpected calcification. Skull: Postsurgical changes are noted in the occipital bone on the left consistent with prior aneurysm clipping. Sinuses/Orbits: No acute finding. Other: None. IMPRESSION: Chronic atrophic and ischemic changes without acute abnormality. Electronically Signed   By: Inez Catalina M.D.   On: 06/10/2022 22:58   DG Chest Portable 1 View  Result Date: 06/10/2022 CLINICAL DATA:  Shortness of breath EXAM: PORTABLE CHEST 1 VIEW COMPARISON:  06/08/2022 CT FINDINGS: Cardiac shadow is enlarged but stable. Aortic calcifications are noted. Diffuse emphysematous changes are noted with fibrosis similar to that seen on prior CT examination. The prior surgical changes are again seen in the right infrahilar region. Area of increased density in the right upper lobe seen on prior CT examination has resolved in the interval. No sizable effusion is noted. No bony abnormality is seen. IMPRESSION: Emphysematous changes with underlying fibrotic change. No acute abnormality noted. Electronically Signed   By: Inez Catalina M.D.   On: 06/10/2022 19:34    EKG: Independently reviewed. ***  Assessment/Plan Syncope   Hypoxemia  -reso DVT prophylaxis: *** (Lovenox/Heparin/SCD's/anticoagulated/None (if comfort care) Code Status: *** (Full/Partial (specify details) Family Communication: *** (Specify name, relationship. Do not write "discussed with patient". Specify tel # if discussed over the phone) Disposition Plan: *** (specify when and where you expect patient to be discharged) Consults called: *** (with names) Admission status: *** (inpatient / obs / tele / medical floor / SDU)   Clance Boll MD [facility] Pager [phone]  If 7PM-7AM, please  contact night-coverage [email or web address] Password [facility]  06/10/2022, 11:42 PM

## 2022-06-10 NOTE — ED Triage Notes (Signed)
Per [facility] pt coming from home- patient was sitting at dinner table and became unresponsive. Was 76% on his baseline 4LNC. Fire placed on NRB. Patient remained alert to painful stimuli. En route patient became more alert and answering questions. Had 1 episode of vomiting en route. Patient A&0x4 on arrival.

## 2022-06-10 NOTE — ED Notes (Signed)
Respiratory at bedside placing patient on bipap

## 2022-06-10 NOTE — ED Provider Notes (Signed)
[facility] EMERGENCY DEPARTMENT Provider Note   CSN: [id number] Arrival date & time: 06/10/22  1825     History  Chief Complaint  Patient presents with   Respiratory Distress    Jeffrey Stephens is a 81 y.o. male. Presenting due to decreased responsiveness that occurred suddenly while sitting down.  Patient was then found to be hypoxic.  He is usually on 4 L nasal cannula. Family members said he stopped breathing and so started to beat on his chest while he was in the chair to perform CPR.  They did not have him from the chair or lie him flat to his history of CHF.  He then began breathing again.  Patient reports increased leg swelling over the past few days.  He also reports feeling very badly throughout the day today.  He does report cough that has been occurring over the past several weeks that is intermittently dark in color alternating with clear/yellow.  Denies fever.  HPI     Home Medications Prior to Admission medications   Medication Sig Start Date End Date Taking? Authorizing Provider  acetaminophen (TYLENOL) 500 MG tablet Take 1,000 mg by mouth every 6 (six) hours as needed for moderate pain.   Yes [provider]  albuterol (VENTOLIN HFA) 108 (90 Base) MCG/ACT inhaler TAKE 2 PUFFS BY MOUTH EVERY 6 HOURS AS NEEDED FOR WHEEZE OR SHORTNESS OF BREATH Patient taking differently: Inhale 2 puffs into the lungs every 6 (six) hours as needed for shortness of breath or wheezing. 09/20/21  Yes Collene Gobble, MD  budesonide-formoterol [facility]) 160-4.5 MCG/ACT inhaler Inhale 2 puffs into the lungs 2 (two) times daily. 01/27/22  Yes Ghimire, Henreitta Leber, MD  tiotropium (SPIRIVA HANDIHALER) 18 MCG inhalation capsule Place 1 capsule (18 mcg total) into inhaler and inhale daily. 01/27/22 01/27/23 Yes Ghimire, Henreitta Leber, MD  albuterol (PROVENTIL) (2.5 MG/3ML) 0.083% nebulizer solution Take 3 mLs (2.5 mg total) by nebulization every 6 (six) hours as needed for  wheezing or shortness of breath. 02/28/22   Collene Gobble, MD  Cholecalciferol (VITAMIN D3 PO) Take 2,000 mcg by mouth in the morning and at bedtime.    [provider]  diltiazem (CARDIZEM CD) 180 MG 24 hr capsule Take 1 capsule (180 mg total) by mouth daily. 01/09/22 04/09/22  Barb Merino, MD  ELIQUIS 5 MG TABS tablet TAKE 1 TABLET BY MOUTH TWICE A DAY Patient taking differently: Take 5 mg by mouth 2 (two) times daily. 11/21/21   Croitoru, Mihai, MD  folic acid (FOLVITE) 1 MG tablet Take 1 mg by mouth every morning.     [provider]  furosemide (LASIX) 40 MG tablet Take 1 tablet (40 mg total) by mouth daily. 01/28/22   Ghimire, Henreitta Leber, MD  ipratropium-albuterol (DUONEB) 0.5-2.5 (3) MG/3ML SOLN Take 3 mLs by nebulization every 6 (six) hours as needed. 01/27/22   Ghimire, Henreitta Leber, MD  lactulose (CHRONULAC) 10 GM/15ML solution Take 30 mLs (20 g total) by mouth 2 (two) times daily. Patient taking differently: Take 20 g by mouth daily. 01/08/22   Barb Merino, MD  lansoprazole (PREVACID) 30 MG capsule Take 30 mg by mouth every morning.    [provider]  methocarbamol (ROBAXIN) 500 MG tablet Take 1 tablet (500 mg total) by mouth every 6 (six) hours as needed for muscle spasms. 01/06/22   Meredith Pel, MD  methotrexate (RHEUMATREX) 5 MG tablet Take 10 mg by mouth every Monday. Caution: Chemotherapy. Protect  from light.    [provider]  Multiple Vitamins-Minerals (CENTRUM SILVER ADULT 50+) TABS Take 50 mg by mouth daily.    [provider]  oxyCODONE (OXY IR/ROXICODONE) 5 MG immediate release tablet Take 1 tablet (5 mg total) by mouth daily as needed for severe pain. 03/20/22   Magnant, Charles L, PA-C  potassium chloride SA (KLOR-CON M) 20 MEQ tablet Take 1 tablet (20 mEq total) by mouth daily. 01/27/22   Ghimire, Henreitta Leber, MD      Allergies    Patient has no known allergies.    Review of Systems   Review of Systems  Constitutional:   Positive for activity change and fatigue. Negative for chills and fever.  HENT:  Negative for ear pain and sore throat.   Eyes:  Negative for pain and visual disturbance.  Respiratory:  Positive for shortness of breath. Negative for cough.   Cardiovascular:  Negative for chest pain and palpitations.  Gastrointestinal:  Negative for abdominal pain and vomiting.  Genitourinary:  Negative for dysuria and hematuria.  Musculoskeletal:  Negative for arthralgias and back pain.  Skin:  Negative for color change and rash.  Neurological:  Positive for weakness. Negative for seizures and syncope.  All other systems reviewed and are negative.   Physical Exam Updated Vital Signs BP 111/62   Pulse 88   Temp 97.7 F (36.5 C) (Oral)   Resp 15   Ht 5\' 9"  (1.753 m)   Wt 104.3 kg   SpO2 96%   BMI 33.97 kg/m  Physical Exam Vitals and nursing note reviewed.  Constitutional:      General: He is in acute distress.     Appearance: He is well-developed. He is ill-appearing.  HENT:     Head: Normocephalic and atraumatic.  Eyes:     Conjunctiva/sclera: Conjunctivae normal.  Cardiovascular:     Rate and Rhythm: Tachycardia present. Rhythm irregular.     Heart sounds: No murmur heard. Pulmonary:     Effort: Respiratory distress present.     Breath sounds: Rhonchi present.  Abdominal:     Palpations: Abdomen is soft.     Tenderness: There is no abdominal tenderness.  Musculoskeletal:        General: No swelling.     Cervical back: Neck supple.  Skin:    General: Skin is warm and dry.     Capillary Refill: Capillary refill takes less than 2 seconds.  Neurological:     Mental Status: He is alert.  Psychiatric:        Mood and Affect: Mood normal.     ED Results / Procedures / Treatments   Labs (all labs ordered are listed, but only abnormal results are displayed) Labs Reviewed  CBC WITH DIFFERENTIAL/PLATELET - Abnormal; Notable for the following components:      Result Value   RBC 3.85  (*)    Hemoglobin 10.2 (*)    HCT 34.7 (*)    MCHC 29.4 (*)    RDW 24.2 (*)    All other components within normal limits  COMPREHENSIVE METABOLIC PANEL - Abnormal; Notable for the following components:   Glucose, Bld 109 (*)    Creatinine, Ser 1.53 (*)    Total Protein 6.3 (*)    Albumin 2.9 (*)    Total Bilirubin 1.5 (*)    GFR, Estimated 46 (*)    All other components within normal limits  BRAIN NATRIURETIC PEPTIDE - Abnormal; Notable for the following components:   B  Natriuretic Peptide 224.2 (*)    All other components within normal limits  PROTIME-INR - Abnormal; Notable for the following components:   Prothrombin Time 21.7 (*)    INR 1.9 (*)    All other components within normal limits  CBG MONITORING, ED - Abnormal; Notable for the following components:   Glucose-Capillary 102 (*)    All other components within normal limits  I-STAT ARTERIAL BLOOD GAS, ED - Abnormal; Notable for the following components:   pO2, Arterial 71 (*)    Bicarbonate 30.3 (*)    Acid-Base Excess 5.0 (*)    HCT 34.0 (*)    Hemoglobin 11.6 (*)    All other components within normal limits  I-STAT CHEM 8, ED - Abnormal; Notable for the following components:   Creatinine, Ser 1.50 (*)    Glucose, Bld 106 (*)    Calcium, Ion 1.11 (*)    Hemoglobin 12.2 (*)    HCT 36.0 (*)    All other components within normal limits  LACTIC ACID, PLASMA  LACTIC ACID, PLASMA  TROPONIN I (HIGH SENSITIVITY)  TROPONIN I (HIGH SENSITIVITY)    EKG EKG Interpretation  Date/Time:  Saturday June 10 2022 18:31:03 EDT Ventricular Rate:  127 PR Interval:    QRS Duration: 95 QT Interval:  359 QTC Calculation: 522 R Axis:   131 Text Interpretation: Atrial flutter with predominant 2:1 AV block Right axis deviation RSR' in V1 or V2, probably normal variant Nonspecific T abnormalities, inferior leads Prolonged QT interval No significant change was found Confirmed by Ezequiel Essex [phone]) on 06/10/2022 6:38:12  PM  Radiology CT Angio Chest PE W and/or Wo Contrast  Result Date: 06/10/2022 CLINICAL DATA:  Positive D-dimer. EXAM: CT ANGIOGRAPHY CHEST WITH CONTRAST TECHNIQUE: Multidetector CT imaging of the chest was performed using the standard protocol during bolus administration of intravenous contrast. Multiplanar CT image reconstructions and MIPs were obtained to evaluate the vascular anatomy. RADIATION DOSE REDUCTION: This exam was performed according to the departmental dose-optimization program which includes automated exposure control, adjustment of the mA and/or kV according to patient size and/or use of iterative reconstruction technique. CONTRAST:  71mL OMNIPAQUE IOHEXOL 350 MG/ML SOLN COMPARISON:  Chest CT 02/23/2022 FINDINGS: Cardiovascular: Satisfactory opacification of the pulmonary arteries to the segmental level. No evidence of pulmonary embolism. Normal heart size. No pericardial effusion. There are atherosclerotic calcifications of the aorta and coronary arteries. Mediastinum/Nodes: There surgical clips in the right hilar region. There are nonenlarged and enlarged right hilar lymph nodes measuring up to 16 mm. This appears unchanged. No new enlarged lymph nodes are seen. Visualized thyroid gland and esophagus are within normal limits. Lungs/Pleura: Severe emphysematous changes are again seen. There are small bilateral pleural effusions which are similar to the prior examination. There is some ground-glass opacities in the bilateral lower lungs, also unchanged. Scarring along the right major fissure is unchanged from prior. There is no new focal lung infiltrate or pneumothorax. Upper Abdomen: 4 cm left renal cyst is partially visualized. Rounded hyperdensity in the left kidney is too small to characterize and unchanged, possibly a complex cysts. Small right renal cyst is not well seen secondary to artifact. Musculoskeletal: No chest wall abnormality. No acute or significant osseous findings. Review of  the MIP images confirms the above findings. IMPRESSION: 1. No evidence for pulmonary embolism. 2. Stable small pleural effusions. 3. Stable ground-glass opacities in the bilateral lower lungs which may represent infection/inflammation or edema. 4. Stable severe emphysema. 5. Multiple lesions, including 4 cm  left Bosniak 1 benign simple cyst. No follow-up imaging is recommended. JACR 2018 Feb; 264-273, Management of the Incidental RenalMass on CT, [employer] 2021; 814-848, Bosniak Classification of Cystic Renal Masses, Version 2019. Aortic Atherosclerosis (ICD10-I70.0) and Emphysema (ICD10-J43.9). Electronically Signed   By: Ronney Asters M.D.   On: 06/10/2022 23:04   CT Head Wo Contrast  Result Date: 06/10/2022 CLINICAL DATA:  Altered mental status EXAM: CT HEAD WITHOUT CONTRAST TECHNIQUE: Contiguous axial images were obtained from the base of the skull through the vertex without intravenous contrast. RADIATION DOSE REDUCTION: This exam was performed according to the departmental dose-optimization program which includes automated exposure control, adjustment of the mA and/or kV according to patient size and/or use of iterative reconstruction technique. COMPARISON:  None Available. FINDINGS: Brain: No evidence of acute infarction, hemorrhage, hydrocephalus, extra-axial collection or mass lesion/mass effect. Chronic atrophic changes are noted as well as chronic white matter ischemic change. Findings of prior aneurysm clipping noted in the posterior fossa on the left. Vascular: No hyperdense vessel or unexpected calcification. Skull: Postsurgical changes are noted in the occipital bone on the left consistent with prior aneurysm clipping. Sinuses/Orbits: No acute finding. Other: None. IMPRESSION: Chronic atrophic and ischemic changes without acute abnormality. Electronically Signed   By: Inez Catalina M.D.   On: 06/10/2022 22:58   DG Chest Portable 1 View  Result Date: 06/10/2022 CLINICAL DATA:  Shortness of  breath EXAM: PORTABLE CHEST 1 VIEW COMPARISON:  06/08/2022 CT FINDINGS: Cardiac shadow is enlarged but stable. Aortic calcifications are noted. Diffuse emphysematous changes are noted with fibrosis similar to that seen on prior CT examination. The prior surgical changes are again seen in the right infrahilar region. Area of increased density in the right upper lobe seen on prior CT examination has resolved in the interval. No sizable effusion is noted. No bony abnormality is seen. IMPRESSION: Emphysematous changes with underlying fibrotic change. No acute abnormality noted. Electronically Signed   By: Inez Catalina M.D.   On: 06/10/2022 19:34    Procedures Procedures    Medications Ordered in ED Medications  iohexol (OMNIPAQUE) 350 MG/ML injection 80 mL (80 mLs Intravenous Contrast Given 06/10/22 2250)    ED Course/ Medical Decision Making/ A&P                           Medical Decision Making Amount and/or Complexity of Data Reviewed Labs: ordered. Radiology: ordered.  Risk Prescription drug management. Decision regarding hospitalization.   81 year old male with past medical history of atrial flutter on Eliquis, VTE, lung cancer s/p lobectomy 2017, RA on methotrexate, COPD on 4 L home nasal cannula, recent admission 4 months ago requiring intubation due to pneumonia and rhinovirus presenting with sudden onset decreased responsiveness, hypoxia, and shortness of breath. Family members report that patient suddenly lost consciousness and stopped breathing.  When EMS arrived patient was only arousable to painful stimuli and hypoxic to the 70s.  His mental status gradually improved and placed on oxygen therapy.  On arrival he is awake, alert, oriented x3.  Nonrebreather was removed and he was placed on 4 L nasal cannula, he gradually became hypoxic to the upper 70s.  He was then placed on BiPAP.  Differential diagnosis includes heart failure exacerbation, fluid overload, COPD exacerbation,  syncope, pneumonia, arrhythmia, AKI, electrolyte abnormalities, PE.  On reevaluation, patient appears comfortable on BiPAP.  He is satting 100%.  Labs notable for AKI with creatinine 1.5, increased from baseline of 0.9.  No lactic acidosis.  No leukocytosis.  Mild anemia to 10.2.  BNP mildly elevated to 24. Initial blood gas arterial showed normal pH of 7.4, with PCO2 44.  Troponin reassuring at 14. EKG reviewed and showed atrial flutter with RVR initially.  However once placed on BiPAP he then became rate controlled to the 80s, but continues to be in atrial flutter.  CT head did not show any intracranial hemorrhage or abnormalities.  CT chest did not show acute changes or PE, it re demonstrated chronic emphysematous changes.  On reevaluation, patient has been transitioned to below his baseline 4 L nasal cannula and reports feeling well overall.  Overall, concern for high risk syncope vs seizure with prolonged hypoxia. Patient had a similar episode last week but was not seen by physician since that time.  Patient admitted to hospitalist service for continued work up and observation.          Final Clinical Impression(s) / ED Diagnoses Final diagnoses:  Syncope and collapse    Rx / DC Orders ED Discharge Orders     None         Rosine Abe, MD 06/10/22 [id number]    Ezequiel Essex, MD 06/12/22 [phone]

## 2022-06-10 NOTE — ED Notes (Signed)
Pt wants to finish eating before CT @ 2147

## 2022-06-10 NOTE — Progress Notes (Signed)
RT set up BIPAP and placed on patient. Pt tolerating settings well at this time. RT obtained ABG and will monitor pt as needed.

## 2022-06-10 NOTE — H&P (Signed)
History and Physical    Jeffrey Stephens IEP:[id number] DOB: July 27, 1941 DOA: 06/10/2022  PCP: Lavone Orn, MD  Patient coming from: home  I have personally briefly reviewed patient's old medical records in [place]  Chief Complaint: syncope with hypoxemia  HPI: Jeffrey Stephens is a 81 y.o. male with medical history significant of atrial flutter on Eliquis, VTE, lung cancer s/p lobectomy 2017, RA on methotrexate, COPD home O2 4L who presents to ED s/p  episode of syncope associated with period of prolonged unresponsiveness . On ems arrival patient was noted to be hypoxic on his  4L [place]  70;s . Patient was  placed on on NRB. Per patient he has had 3 episode of syncope in the last 3 weeks. He states his prior episode ws one day ago with quick recovery. He notes no prodrome. He notes no weakness after episode, chest pain n/v , bladder or bowel incontinence  or seizure like activity. Currently he states he does not feel well but unable to describe symptoms.  He notes he has not felt well for the past few days. He notes no new sob, fever/chills/ cough / diarrhea/  abdominal pain/ dysuria or frequency.     ED Course:  On arrival to ED per ED provider patient was alert and oriented but still hypoxic requiring,brief support with  bipap. Patient  eventually able to be weaned back on 5L.  ED work up notes negative CTH, CTPA , Agb with normal PH.   There was no sz like activity described by EMS or family  Labs: Inr 1.9, wbc 8.9, hgb 12.2,plt 331 Na 140, K 3.8, cr 1.53 (0.99) Bnp 224.2 Ph 7.438 /44  CE 14,16 Lacti 1.1 Review of Systems: As per HPI otherwise 10 point review of systems negative.   Past Medical History:  Diagnosis Date   AAA (abdominal aortic aneurysm) ([place])    a. followed by Dr. Deitra Mayo - 01/2016 CT: 3.6 cm infrarenal AAA.   Abnormal nuclear cardiac imaging test 07/13/2014   a. 06/2014 MV: possible reversible inferior wall defect,no wma, nl EF, felt to be  artifact.   Basal cell carcinoma    "LLE; some on my head"   BPH (benign prostatic hyperplasia)    Cerebral aneurysm 1994   Constipation    Depression    DVT (deep venous thrombosis) ([place]) 2001   Hx of Left leg    Dysrhythmia    AFIB    Frequency of urination    GERD (gastroesophageal reflux disease)    History of echocardiogram    a. 2D ECHO: 06/11/2014: EF 55-60%. Normal wall thickness. Indeterminate DD ( atrial flutter ). No RWMA. Mild LA dilation. Normal RV size and systolic function. No significant valvular abnormalities.   History of palpitations    OCCASIONAL   Kidney stones    Lung nodule    a. 02/2016 PET scan: intensely hypermetabolic 2.0 x 1.5 cm central RLL nodule (SUV 23).   Paroxysmal atrial flutter ([place])    a. On eliquis and sotalol (CHA2DS2VASc = 1).   Rheumatoid arthritis ([place])    "qwhere"   Stroke [facility]) 1994   "when I had ruptured aneurysm in my head"   TIA (transient ischemic attack)    1994 WITH ANEYRYSM    Past Surgical History:  Procedure Laterality Date   BASAL CELL CARCINOMA EXCISION     "LLE; top of my head"   CEREBRAL ANEURYSM REPAIR  1994   Hx of ruptured brain aneurysm  Tx by Dr. Ellene Route   CYSTOSCOPY W/ STONE MANIPULATION  1980's X 1   EXCISIONAL HEMORRHOIDECTOMY  1970's   INTRAMEDULLARY (IM) NAIL INTERTROCHANTERIC Right 01/03/2022   Procedure: INTRAMEDULLARY (IM) NAIL INTERTROCHANTRIC; ASPIRATION OF RIGHT KNEE EFFUSION AND INJECTION.;  Surgeon: Meredith Pel, MD;  Location: [place];  Service: Orthopedics;  Laterality: Right;   JOINT REPLACEMENT     LT KNEE   TOTAL KNEE ARTHROPLASTY Left 05/14/2014   Procedure: LEFT TOTAL KNEE ARTHROPLASTY;  Surgeon: Johnn Hai, MD;  Location: [facility];  Service: Orthopedics;  Laterality: Left;   VIDEO ASSISTED THORACOSCOPY (VATS)/WEDGE RESECTION Right 05/08/2016   Procedure: VIDEO ASSISTED THORACOSCOPY (VATS)/LUNG RESECTION;  Surgeon: Ivin Poot, MD;  Location: [place];  Service: Thoracic;  Laterality:  Right;     reports that he quit smoking about 29 years ago. His smoking use included cigarettes. He has a 92.50 pack-year smoking history. He has never used smokeless tobacco. He reports that he does not drink alcohol and does not use drugs.  No Known Allergies  Family History  Problem Relation Age of Onset   Heart disease Father        Aneurysm   Diabetes Father    Heart attack Father    Heart disease Brother        Heart Disease before age 39   Cancer Brother    Hypertension Neg Hx    Stroke Neg Hx     Prior to Admission medications   Medication Sig Start Date End Date Taking? Authorizing Provider  acetaminophen (TYLENOL) 500 MG tablet Take 1,000 mg by mouth every 6 (six) hours as needed for moderate pain.   Yes [provider]  albuterol (VENTOLIN HFA) 108 (90 Base) MCG/ACT inhaler TAKE 2 PUFFS BY MOUTH EVERY 6 HOURS AS NEEDED FOR WHEEZE OR SHORTNESS OF BREATH Patient taking differently: Inhale 2 puffs into the lungs every 6 (six) hours as needed for shortness of breath or wheezing. 09/20/21  Yes Collene Gobble, MD  budesonide-formoterol [facility]) 160-4.5 MCG/ACT inhaler Inhale 2 puffs into the lungs 2 (two) times daily. 01/27/22  Yes Ghimire, Henreitta Leber, MD  tiotropium (SPIRIVA HANDIHALER) 18 MCG inhalation capsule Place 1 capsule (18 mcg total) into inhaler and inhale daily. 01/27/22 01/27/23 Yes Ghimire, Henreitta Leber, MD  albuterol (PROVENTIL) (2.5 MG/3ML) 0.083% nebulizer solution Take 3 mLs (2.5 mg total) by nebulization every 6 (six) hours as needed for wheezing or shortness of breath. 02/28/22   Collene Gobble, MD  Cholecalciferol (VITAMIN D3 PO) Take 2,000 mcg by mouth in the morning and at bedtime.    [provider]  diltiazem (CARDIZEM CD) 180 MG 24 hr capsule Take 1 capsule (180 mg total) by mouth daily. 01/09/22 04/09/22  Barb Merino, MD  ELIQUIS 5 MG TABS tablet TAKE 1 TABLET BY MOUTH TWICE A DAY Patient taking differently: Take 5 mg by mouth 2 (two) times  daily. 11/21/21   Croitoru, Mihai, MD  folic acid (FOLVITE) 1 MG tablet Take 1 mg by mouth every morning.     [provider]  furosemide (LASIX) 40 MG tablet Take 1 tablet (40 mg total) by mouth daily. 01/28/22   Ghimire, Henreitta Leber, MD  ipratropium-albuterol (DUONEB) 0.5-2.5 (3) MG/3ML SOLN Take 3 mLs by nebulization every 6 (six) hours as needed. 01/27/22   Ghimire, Henreitta Leber, MD  lactulose (CHRONULAC) 10 GM/15ML solution Take 30 mLs (20 g total) by mouth 2 (two) times daily. Patient taking differently: Take 20 g by  mouth daily. 01/08/22   Barb Merino, MD  lansoprazole (PREVACID) 30 MG capsule Take 30 mg by mouth every morning.    [provider]  methocarbamol (ROBAXIN) 500 MG tablet Take 1 tablet (500 mg total) by mouth every 6 (six) hours as needed for muscle spasms. 01/06/22   Meredith Pel, MD  methotrexate (RHEUMATREX) 5 MG tablet Take 10 mg by mouth every Monday. Caution: Chemotherapy. Protect from light.    [provider]  Multiple Vitamins-Minerals (CENTRUM SILVER ADULT 50+) TABS Take 50 mg by mouth daily.    [provider]  oxyCODONE (OXY IR/ROXICODONE) 5 MG immediate release tablet Take 1 tablet (5 mg total) by mouth daily as needed for severe pain. 03/20/22   Magnant, Charles L, PA-C  potassium chloride SA (KLOR-CON M) 20 MEQ tablet Take 1 tablet (20 mEq total) by mouth daily. 01/27/22   Jonetta Osgood, MD    Physical Exam: Vitals:   06/10/22 2000 06/10/22 2118 06/10/22 2130 06/10/22 2200  BP: 122/76 116/68 117/70 111/62  Pulse: 88 88 88 88  Resp: 15 19 19 15   Temp:      TempSrc:      SpO2: 100% 97% 94% 96%  Weight:      Height:        Vitals:   06/10/22 2000 06/10/22 2118 06/10/22 2130 06/10/22 2200  BP: 122/76 116/68 117/70 111/62  Pulse: 88 88 88 88  Resp: 15 19 19 15   Temp:      TempSrc:      SpO2: 100% 97% 94% 96%  Weight:      Height:       Constitutional: NAD, calm, comfortable Eyes: PERRL, lids and conjunctivae  normal ENMT: Mucous membranes are moist. Posterior pharynx clear of any exudate or lesions.Normal dentition.  Neck: normal, supple, no masses, no thyromegaly Respiratory: clear to auscultation bilaterally, no wheezing, no crackles. Normal respiratory effort. No accessory muscle use.  Cardiovascular: Regular rate and rhythm, no murmurs / rubs / gallops. +extremity edema. + pedal pulses. Abdomen: no tenderness, no masses palpated. No hepatosplenomegaly. Bowel sounds positive.  Musculoskeletal: no clubbing / cyanosis. No joint deformity upper and lower extremities. Good ROM, no contractures. Normal muscle tone.  Skin: no rashes, lesions, ulcers. No induration Neurologic: CN 2-12 grossly intact. Sensation intact, Strength 5/5 in all 4.  Psychiatric: Normal judgment and insight. Alert and oriented x 3. Normal mood.    Labs on Admission: I have personally reviewed following labs and imaging studies  CBC: Recent Labs  Lab 06/10/22 1836 06/10/22 1855 06/10/22 1858  WBC 8.9  --   --   NEUTROABS 7.4  --   --   HGB 10.2* 11.6* 12.2*  HCT 34.7* 34.0* 36.0*  MCV 90.1  --   --   PLT 331  --   --    Basic Metabolic Panel: Recent Labs  Lab 06/10/22 1836 06/10/22 1855 06/10/22 1858  NA 140 139 138  K 3.8 3.6 3.7  CL 99  --  99  CO2 28  --   --   GLUCOSE 109*  --  106*  BUN 13  --  13  CREATININE 1.53*  --  1.50*  CALCIUM 8.9  --   --    GFR: Estimated Creatinine Clearance: 47.5 mL/min (A) (by C-G formula based on SCr of 1.5 mg/dL (H)). Liver Function Tests: Recent Labs  Lab 06/10/22 1836  AST 20  ALT 12  ALKPHOS 38  BILITOT 1.5*  PROT  6.3*  ALBUMIN 2.9*   No results for input(s): "LIPASE", "AMYLASE" in the last 168 hours. No results for input(s): "AMMONIA" in the last 168 hours. Coagulation Profile: Recent Labs  Lab 06/10/22 1836  INR 1.9*   Cardiac Enzymes: No results for input(s): "CKTOTAL", "CKMB", "CKMBINDEX", "TROPONINI" in the last 168 hours. BNP (last 3  results) No results for input(s): "PROBNP" in the last 8760 hours. HbA1C: No results for input(s): "HGBA1C" in the last 72 hours. CBG: Recent Labs  Lab 06/10/22 1835  GLUCAP 102*   Lipid Profile: No results for input(s): "CHOL", "HDL", "LDLCALC", "TRIG", "CHOLHDL", "LDLDIRECT" in the last 72 hours. Thyroid Function Tests: No results for input(s): "TSH", "T4TOTAL", "FREET4", "T3FREE", "THYROIDAB" in the last 72 hours. Anemia Panel: No results for input(s): "VITAMINB12", "FOLATE", "FERRITIN", "TIBC", "IRON", "RETICCTPCT" in the last 72 hours. Urine analysis:    Component Value Date/Time   COLORURINE AMBER (A) 01/20/2022 0739   APPEARANCEUR HAZY (A) 01/20/2022 0739   LABSPEC 1.021 01/20/2022 0739   PHURINE 5.0 01/20/2022 0739   GLUCOSEU NEGATIVE 01/20/2022 0739   HGBUR MODERATE (A) 01/20/2022 0739   BILIRUBINUR NEGATIVE 01/20/2022 0739   BILIRUBINUR small (A) 01/26/2016 1119   KETONESUR NEGATIVE 01/20/2022 0739   PROTEINUR 30 (A) 01/20/2022 0739   UROBILINOGEN 0.2 01/26/2016 1119   UROBILINOGEN 0.2 05/07/2014 0905   NITRITE NEGATIVE 01/20/2022 0739   LEUKOCYTESUR NEGATIVE 01/20/2022 0739    Radiological Exams on Admission: CT Angio Chest PE W and/or Wo Contrast  Result Date: 06/10/2022 CLINICAL DATA:  Positive D-dimer. EXAM: CT ANGIOGRAPHY CHEST WITH CONTRAST TECHNIQUE: Multidetector CT imaging of the chest was performed using the standard protocol during bolus administration of intravenous contrast. Multiplanar CT image reconstructions and MIPs were obtained to evaluate the vascular anatomy. RADIATION DOSE REDUCTION: This exam was performed according to the departmental dose-optimization program which includes automated exposure control, adjustment of the mA and/or kV according to patient size and/or use of iterative reconstruction technique. CONTRAST:  7mL OMNIPAQUE IOHEXOL 350 MG/ML SOLN COMPARISON:  Chest CT 02/23/2022 FINDINGS: Cardiovascular: Satisfactory opacification of  the pulmonary arteries to the segmental level. No evidence of pulmonary embolism. Normal heart size. No pericardial effusion. There are atherosclerotic calcifications of the aorta and coronary arteries. Mediastinum/Nodes: There surgical clips in the right hilar region. There are nonenlarged and enlarged right hilar lymph nodes measuring up to 16 mm. This appears unchanged. No new enlarged lymph nodes are seen. Visualized thyroid gland and esophagus are within normal limits. Lungs/Pleura: Severe emphysematous changes are again seen. There are small bilateral pleural effusions which are similar to the prior examination. There is some ground-glass opacities in the bilateral lower lungs, also unchanged. Scarring along the right major fissure is unchanged from prior. There is no new focal lung infiltrate or pneumothorax. Upper Abdomen: 4 cm left renal cyst is partially visualized. Rounded hyperdensity in the left kidney is too small to characterize and unchanged, possibly a complex cysts. Small right renal cyst is not well seen secondary to artifact. Musculoskeletal: No chest wall abnormality. No acute or significant osseous findings. Review of the MIP images confirms the above findings. IMPRESSION: 1. No evidence for pulmonary embolism. 2. Stable small pleural effusions. 3. Stable ground-glass opacities in the bilateral lower lungs which may represent infection/inflammation or edema. 4. Stable severe emphysema. 5. Multiple lesions, including 4 cm left Bosniak 1 benign simple cyst. No follow-up imaging is recommended. JACR 2018 Feb; 264-273, Management of the Incidental RenalMass on CT, [employer] 2021; 814-848, Bosniak Classification of  Cystic Renal Masses, Version 2019. Aortic Atherosclerosis (ICD10-I70.0) and Emphysema (ICD10-J43.9). Electronically Signed   By: Ronney Asters M.D.   On: 06/10/2022 23:04   CT Head Wo Contrast  Result Date: 06/10/2022 CLINICAL DATA:  Altered mental status EXAM: CT HEAD WITHOUT  CONTRAST TECHNIQUE: Contiguous axial images were obtained from the base of the skull through the vertex without intravenous contrast. RADIATION DOSE REDUCTION: This exam was performed according to the departmental dose-optimization program which includes automated exposure control, adjustment of the mA and/or kV according to patient size and/or use of iterative reconstruction technique. COMPARISON:  None Available. FINDINGS: Brain: No evidence of acute infarction, hemorrhage, hydrocephalus, extra-axial collection or mass lesion/mass effect. Chronic atrophic changes are noted as well as chronic white matter ischemic change. Findings of prior aneurysm clipping noted in the posterior fossa on the left. Vascular: No hyperdense vessel or unexpected calcification. Skull: Postsurgical changes are noted in the occipital bone on the left consistent with prior aneurysm clipping. Sinuses/Orbits: No acute finding. Other: None. IMPRESSION: Chronic atrophic and ischemic changes without acute abnormality. Electronically Signed   By: Inez Catalina M.D.   On: 06/10/2022 22:58   DG Chest Portable 1 View  Result Date: 06/10/2022 CLINICAL DATA:  Shortness of breath EXAM: PORTABLE CHEST 1 VIEW COMPARISON:  06/08/2022 CT FINDINGS: Cardiac shadow is enlarged but stable. Aortic calcifications are noted. Diffuse emphysematous changes are noted with fibrosis similar to that seen on prior CT examination. The prior surgical changes are again seen in the right infrahilar region. Area of increased density in the right upper lobe seen on prior CT examination has resolved in the interval. No sizable effusion is noted. No bony abnormality is seen. IMPRESSION: Emphysematous changes with underlying fibrotic change. No acute abnormality noted. Electronically Signed   By: Inez Catalina M.D.   On: 06/10/2022 19:34    EKG: Independently reviewed.   Assessment/Plan Syncope  -patient has hx of orthostatic hypotension per last cardiology  note -recurrent episode  -current episode with period of prolonged unresponsiveness  - admit progressive care  -neuro checks / echo/ MRI /carotids  -cannot r/o sz , post itcal state, will order EEG -possible cardiac- related to arrhythmia monitor on tele consider cardiology consult in am , however with hx of orthostatic hypotension and current AKI  orthostatic hypotension may be cause of episodes  -check orthostatics now and in am  -consider neuro/cardiology consult in am for further evaluation based on results of above testing.  Acute on Chronic Hypoxemic respiratory failure  -resolved  -unclear cause , due to poor ventilation in unresponsive patient with hx of chronic hypoxemia with prolonged period to recovery.  -will order repeat abg to assess current baseline  -monitor on continuous pulse ox   AKI - cr 1.5 base around 0.9  - hold lasix  - gentle ivfs    AAA -4.5 followed by cardiology  -abdominal u/s in a to be complete  atrial flutter -continue on Eliquis -resume diltiazem for rate control  in as bp tolerates   Hx of VTE - cont on [facility]   Lung cancer s/p lobectomy 2017 -no active issues    RA  -continue on methotrexate   COPD  -on home O2  4L -resume home controller and prn medications  - no acute copd exacerbation   DVT prophylaxis: Eliquis Code Status:full Family Communication: none at beside  Disposition Plan: patient  expected to be admitted greater than 2 midnights  Consults called: neurology Admission status: progressive care  Clance Boll MD [facility]   If 7PM-7AM, please contact night-coverage [email or web address] Password [facility]  06/10/2022, 11:42 PM

## 2022-06-11 ENCOUNTER — Observation Stay (HOSPITAL_COMMUNITY): Payer: Medicare PPO

## 2022-06-11 ENCOUNTER — Other Ambulatory Visit (HOSPITAL_COMMUNITY): Payer: Medicare PPO

## 2022-06-11 DIAGNOSIS — E1122 Type 2 diabetes mellitus with diabetic chronic kidney disease: Secondary | ICD-10-CM | POA: Diagnosis present

## 2022-06-11 DIAGNOSIS — I483 Typical atrial flutter: Secondary | ICD-10-CM | POA: Diagnosis present

## 2022-06-11 DIAGNOSIS — Z7189 Other specified counseling: Secondary | ICD-10-CM | POA: Diagnosis not present

## 2022-06-11 DIAGNOSIS — Z66 Do not resuscitate: Secondary | ICD-10-CM | POA: Diagnosis not present

## 2022-06-11 DIAGNOSIS — E669 Obesity, unspecified: Secondary | ICD-10-CM | POA: Diagnosis present

## 2022-06-11 DIAGNOSIS — R55 Syncope and collapse: Secondary | ICD-10-CM | POA: Diagnosis present

## 2022-06-11 DIAGNOSIS — I441 Atrioventricular block, second degree: Secondary | ICD-10-CM | POA: Diagnosis present

## 2022-06-11 DIAGNOSIS — I5033 Acute on chronic diastolic (congestive) heart failure: Secondary | ICD-10-CM | POA: Diagnosis not present

## 2022-06-11 DIAGNOSIS — I4819 Other persistent atrial fibrillation: Secondary | ICD-10-CM | POA: Diagnosis not present

## 2022-06-11 DIAGNOSIS — J441 Chronic obstructive pulmonary disease with (acute) exacerbation: Secondary | ICD-10-CM | POA: Diagnosis not present

## 2022-06-11 DIAGNOSIS — J9811 Atelectasis: Secondary | ICD-10-CM | POA: Diagnosis not present

## 2022-06-11 DIAGNOSIS — J432 Centrilobular emphysema: Secondary | ICD-10-CM | POA: Diagnosis not present

## 2022-06-11 DIAGNOSIS — D631 Anemia in chronic kidney disease: Secondary | ICD-10-CM | POA: Diagnosis present

## 2022-06-11 DIAGNOSIS — Z9981 Dependence on supplemental oxygen: Secondary | ICD-10-CM | POA: Diagnosis not present

## 2022-06-11 DIAGNOSIS — I5082 Biventricular heart failure: Secondary | ICD-10-CM | POA: Diagnosis present

## 2022-06-11 DIAGNOSIS — N1832 Chronic kidney disease, stage 3b: Secondary | ICD-10-CM | POA: Diagnosis present

## 2022-06-11 DIAGNOSIS — N179 Acute kidney failure, unspecified: Secondary | ICD-10-CM | POA: Diagnosis present

## 2022-06-11 DIAGNOSIS — R230 Cyanosis: Secondary | ICD-10-CM | POA: Diagnosis not present

## 2022-06-11 DIAGNOSIS — I2781 Cor pulmonale (chronic): Secondary | ICD-10-CM | POA: Diagnosis present

## 2022-06-11 DIAGNOSIS — I50812 Chronic right heart failure: Secondary | ICD-10-CM | POA: Diagnosis not present

## 2022-06-11 DIAGNOSIS — I130 Hypertensive heart and chronic kidney disease with heart failure and stage 1 through stage 4 chronic kidney disease, or unspecified chronic kidney disease: Secondary | ICD-10-CM | POA: Diagnosis present

## 2022-06-11 DIAGNOSIS — L89322 Pressure ulcer of left buttock, stage 2: Secondary | ICD-10-CM | POA: Diagnosis present

## 2022-06-11 DIAGNOSIS — I7143 Infrarenal abdominal aortic aneurysm, without rupture: Secondary | ICD-10-CM | POA: Diagnosis present

## 2022-06-11 DIAGNOSIS — D849 Immunodeficiency, unspecified: Secondary | ICD-10-CM | POA: Diagnosis present

## 2022-06-11 DIAGNOSIS — Z515 Encounter for palliative care: Secondary | ICD-10-CM | POA: Diagnosis not present

## 2022-06-11 DIAGNOSIS — J9611 Chronic respiratory failure with hypoxia: Secondary | ICD-10-CM | POA: Diagnosis not present

## 2022-06-11 DIAGNOSIS — I951 Orthostatic hypotension: Secondary | ICD-10-CM | POA: Diagnosis present

## 2022-06-11 DIAGNOSIS — I480 Paroxysmal atrial fibrillation: Secondary | ICD-10-CM | POA: Diagnosis present

## 2022-06-11 DIAGNOSIS — J9621 Acute and chronic respiratory failure with hypoxia: Secondary | ICD-10-CM | POA: Diagnosis present

## 2022-06-11 DIAGNOSIS — M069 Rheumatoid arthritis, unspecified: Secondary | ICD-10-CM | POA: Diagnosis present

## 2022-06-11 LAB — URINALYSIS, ROUTINE W REFLEX MICROSCOPIC
Bilirubin Urine: NEGATIVE
Glucose, UA: NEGATIVE mg/dL
Ketones, ur: NEGATIVE mg/dL
Nitrite: NEGATIVE
Protein, ur: 30 mg/dL — AB
RBC / HPF: >50 RBC/hpf — ABNORMAL HIGH (ref 0–5)
Specific Gravity, Urine: >1.046 — ABNORMAL HIGH (ref 1.005–1.030)
WBC, UA: >50 WBC/hpf — ABNORMAL HIGH (ref 0–5)
pH: 5 (ref 5.0–8.0)

## 2022-06-11 LAB — BASIC METABOLIC PANEL
Anion gap: 8 (ref 5–15)
BUN: 12 mg/dL (ref 8–23)
CO2: 30 mmol/L (ref 22–32)
Calcium: 8.6 mg/dL — ABNORMAL LOW (ref 8.9–10.3)
Chloride: 99 mmol/L (ref 98–111)
Creatinine, Ser: 1.49 mg/dL — ABNORMAL HIGH (ref 0.61–1.24)
GFR, Estimated: 47 mL/min — ABNORMAL LOW (ref >60)
Glucose, Bld: 109 mg/dL — ABNORMAL HIGH (ref 70–99)
Potassium: 4.1 mmol/L (ref 3.5–5.1)
Sodium: 137 mmol/L (ref 135–145)

## 2022-06-11 LAB — HEMOGLOBIN A1C
Hgb A1c MFr Bld: 4.4 % — ABNORMAL LOW (ref 4.8–5.6)
Mean Plasma Glucose: 79.58 mg/dL

## 2022-06-11 LAB — I-STAT VENOUS BLOOD GAS, ED
Acid-Base Excess: 7 mmol/L — ABNORMAL HIGH (ref 0.0–2.0)
Bicarbonate: 31.1 mmol/L — ABNORMAL HIGH (ref 20.0–28.0)
Calcium, Ion: 1.21 mmol/L (ref 1.15–1.40)
HCT: 32 % — ABNORMAL LOW (ref 39.0–52.0)
Hemoglobin: 10.9 g/dL — ABNORMAL LOW (ref 13.0–17.0)
O2 Saturation: 65 %
Patient temperature: 98.5
Potassium: 3.7 mmol/L (ref 3.5–5.1)
Sodium: 137 mmol/L (ref 135–145)
TCO2: 32 mmol/L (ref 22–32)
pCO2, Ven: 42.9 mmHg — ABNORMAL LOW (ref 44–60)
pH, Ven: 7.468 — ABNORMAL HIGH (ref 7.25–7.43)
pO2, Ven: 32 mmHg (ref 32–45)

## 2022-06-11 LAB — TSH: TSH: 2.479 u[IU]/mL (ref 0.350–4.500)

## 2022-06-11 LAB — CBG MONITORING, ED: Glucose-Capillary: 93 mg/dL (ref 70–99)

## 2022-06-11 LAB — RESPIRATORY PANEL BY PCR

## 2022-06-11 LAB — TROPONIN I (HIGH SENSITIVITY)
Troponin I (High Sensitivity): 16 ng/L (ref <18)
Troponin I (High Sensitivity): 15 ng/L (ref <18)

## 2022-06-11 MED ORDER — PANTOPRAZOLE SODIUM 20 MG PO TBEC
20.0000 mg | DELAYED_RELEASE_TABLET | Freq: Every day | ORAL | Status: DC
  Administered 2022-06-11 – 2022-06-19 (×9): 20 mg via ORAL
  Filled 2022-06-11 (×9): qty 1

## 2022-06-11 MED ORDER — ALBUTEROL SULFATE (2.5 MG/3ML) 0.083% IN NEBU: 2.5000 mg | INHALATION_SOLUTION | Freq: Four times a day (QID) | RESPIRATORY_TRACT | Status: DC | PRN

## 2022-06-11 MED ORDER — MOMETASONE FURO-FORMOTEROL FUM 200-5 MCG/ACT IN AERO
2.0000 | INHALATION_SPRAY | Freq: Two times a day (BID) | RESPIRATORY_TRACT | Status: DC
  Administered 2022-06-12: 2 via RESPIRATORY_TRACT
  Filled 2022-06-11: qty 8.8

## 2022-06-11 MED ORDER — FUROSEMIDE 10 MG/ML IJ SOLN
40.0000 mg | Freq: Once | INTRAMUSCULAR | Status: AC
  Administered 2022-06-11: 40 mg via INTRAVENOUS
  Filled 2022-06-11: qty 4

## 2022-06-11 MED ORDER — SODIUM CHLORIDE 0.9% FLUSH
3.0000 mL | Freq: Two times a day (BID) | INTRAVENOUS | Status: DC
  Administered 2022-06-11 – 2022-06-14 (×7): 3 mL via INTRAVENOUS

## 2022-06-11 MED ORDER — DILTIAZEM HCL ER COATED BEADS 180 MG PO CP24
180.0000 mg | ORAL_CAPSULE | Freq: Every day | ORAL | Status: DC
  Administered 2022-06-12: 180 mg via ORAL
  Filled 2022-06-11 (×2): qty 1

## 2022-06-11 MED ORDER — SODIUM CHLORIDE 0.9 % IV SOLN: INTRAVENOUS | Status: DC

## 2022-06-11 MED ORDER — ACETAMINOPHEN 650 MG RE SUPP: 650.0000 mg | Freq: Four times a day (QID) | RECTAL | Status: DC | PRN

## 2022-06-11 MED ORDER — IPRATROPIUM-ALBUTEROL 0.5-2.5 (3) MG/3ML IN SOLN: 3.0000 mL | Freq: Four times a day (QID) | RESPIRATORY_TRACT | Status: DC | PRN

## 2022-06-11 MED ORDER — ADULT MULTIVITAMIN W/MINERALS CH
1.0000 | ORAL_TABLET | Freq: Every day | ORAL | Status: DC
  Administered 2022-06-11 – 2022-06-19 (×9): 1 via ORAL
  Filled 2022-06-11 (×10): qty 1

## 2022-06-11 MED ORDER — FOLIC ACID 1 MG PO TABS
1.0000 mg | ORAL_TABLET | Freq: Every morning | ORAL | Status: DC
  Administered 2022-06-11 – 2022-06-19 (×9): 1 mg via ORAL
  Filled 2022-06-11 (×9): qty 1

## 2022-06-11 MED ORDER — ONDANSETRON HCL 4 MG PO TABS: 4.0000 mg | ORAL_TABLET | Freq: Four times a day (QID) | ORAL | Status: DC | PRN

## 2022-06-11 MED ORDER — TIOTROPIUM BROMIDE MONOHYDRATE 18 MCG IN CAPS: 18.0000 ug | ORAL_CAPSULE | Freq: Every day | RESPIRATORY_TRACT | Status: DC

## 2022-06-11 MED ORDER — ACETAMINOPHEN 325 MG PO TABS: 650.0000 mg | ORAL_TABLET | Freq: Four times a day (QID) | ORAL | Status: DC | PRN

## 2022-06-11 MED ORDER — UMECLIDINIUM BROMIDE 62.5 MCG/ACT IN AEPB
1.0000 | INHALATION_SPRAY | Freq: Every day | RESPIRATORY_TRACT | Status: DC
  Administered 2022-06-12: 1 via RESPIRATORY_TRACT
  Filled 2022-06-11: qty 7

## 2022-06-11 MED ORDER — APIXABAN 5 MG PO TABS
5.0000 mg | ORAL_TABLET | Freq: Two times a day (BID) | ORAL | Status: DC
  Administered 2022-06-11 – 2022-06-19 (×18): 5 mg via ORAL
  Filled 2022-06-11 (×18): qty 1

## 2022-06-11 MED ORDER — DOCUSATE SODIUM 100 MG PO CAPS
100.0000 mg | ORAL_CAPSULE | Freq: Every day | ORAL | Status: DC
  Administered 2022-06-11 – 2022-06-13 (×3): 100 mg via ORAL
  Filled 2022-06-11 (×3): qty 1

## 2022-06-11 MED ORDER — ONDANSETRON HCL 4 MG/2ML IJ SOLN: 4.0000 mg | Freq: Four times a day (QID) | INTRAMUSCULAR | Status: DC | PRN

## 2022-06-11 MED ORDER — ACETAMINOPHEN 500 MG PO TABS
1000.0000 mg | ORAL_TABLET | Freq: Four times a day (QID) | ORAL | Status: DC | PRN
  Administered 2022-06-11 – 2022-06-15 (×3): 1000 mg via ORAL
  Filled 2022-06-11 (×3): qty 2

## 2022-06-11 NOTE — Progress Notes (Signed)
Carotid duplex has been completed.   Results can be found under chart review under CV PROC. 06/11/2022 3:41 PM Marilu Rylander RVT, RDMS

## 2022-06-11 NOTE — Procedures (Signed)
History: 81 year old male being evaluated for syncope  Sedation: None  Technique: This EEG was acquired with electrodes placed according to the International 10-20 electrode system (including Fp1, Fp2, F3, F4, C3, C4, P3, P4, O1, O2, T3, T4, T5, T6, A1, A2, Fz, Cz, Pz). The following electrodes were missing or displaced: none.   Background: There is some slowing of the background with the posterior dominant rhythm having a frequency of 7 to 8 Hz.  In addition, there is mild intrusion of generalized irregular delta and theta range activities into the waking background as well.  Sleep is not recorded.  No epileptiform discharges were seen.  Photic stimulation: Physiologic driving is now performed  EEG Abnormalities: 1) generalized irregular slow activity 2) slow posterior dominant rhythm  Clinical Interpretation: This EEG is consistent with a mild generalized nonspecific cerebral dysfunction (encephalopathy). There was no seizure or seizure predisposition recorded on this study. Please note that lack of epileptiform activity on EEG does not preclude the possibility of epilepsy.   Roland Rack, MD [facility] [phone]  If 7pm- 7am, please page neurology on call as listed in [place].

## 2022-06-11 NOTE — Progress Notes (Signed)
Attempted ABG, unsuccessful. Patient is moving and flinching. Although positive reinforcement was given patient was stating that it hurts and refused. VBG was collected.

## 2022-06-11 NOTE — Progress Notes (Signed)
EEG complete - results pending 

## 2022-06-11 NOTE — Consult Note (Signed)
Cardiology Consultation   Patient ID: Jeffrey Stephens MRN: [id number]; DOB: [birth year]  Admit date: 06/10/2022 Date of Consult: 06/11/2022  PCP:  Lavone Orn, MD   [facility] Cardiologist:  Sanda Klein, MD        Patient Profile:   Jeffrey Stephens is a 81 y.o. male with a hx of atrial flutter (bradycardia on sotalol >> DCd), remote DVT, abdominal aortic aneurysm (followed by Dr. Scot Dock), Lung CA s/p RLL wedge resection in 2017, Chronic Obstructive Pulmonary Disease on home O2, 4 cm ascending aorta on CT in 2017 with no dilation noted on follow up CT in 2018, coronary artery Ca2+ on CT, aortic atherosclerosis, cerebral aneurysm c/b secondary hemorrhagic stroke s/p clipping in 1994, orthostatic hypotension, rheumatoid arthritis who is being seen 06/11/2022 for the evaluation of syncope at the request of Dr. Darrick Meigs.  History of Present Illness:   Jeffrey Stephens was last seen by Dr. [name] in March 2023. He fell and was admitted with a R hip fx in April managed with IM nailing. Hospitalization was c/b rapid AFlutter. He was DC on Diltiazem for rate control. He was readmitted in April with a/c hypoxic/hypercarbic resp failure due to rhinovirus pneumonia/Chronic Obstructive Pulmonary Disease exacerbation c/b rapid AFib. Notes indicate he was back in NSR at DC.    The patient presented to the ED via EMS last night due to syncope/unresponsiveness while seated at dinner table. Family attempted CPR in chair due to no evidence of breathing. Breathing returned. Notes indicate his O2 was 76% and he was placed on NRB by first responders. He was alert to painful stimuli only and became more alert en route. He had 1 episode of vomiting. In the ED, he was more responsive and NRB was removed. On 4L [place] he gradually became more hypoxic with O2 in upper 70s. He was placed on BiPAP. He was in AFlutter w RVR initially but HR better controlled with improved O2 sats. He was again transitioned  to 4 L [place] with stable O2 sats.   Labs/data:  Creatinine 1.53 (up from 0.99), K+ 3.8, ALT 12, total protein 6.3, albumin 2.9 Troponin 16 >>16 >>15 BNP 224.2 Lactic acid 1.5 >>1.1 Hgb 10.2>>11.6>>12.2>>10.9 TSH 2.479 Respiratory panel negative CT: No PE, stable small pleural effusions, stable groundglass opacities in bilateral lower lungs, emphysema, renal cysts, aortic atherosclerosis EKG a flutter, 3:1 conduction, HR 87, normal axis no acute ST changes, QTc 401  Cardiology is asked to further evaluate for syncope, heart failure  Past Medical History:  Diagnosis Date   AAA (abdominal aortic aneurysm) ([place])    a. followed by Dr. Deitra Mayo - 01/2016 CT: 3.6 cm infrarenal AAA.   Abnormal nuclear cardiac imaging test 07/13/2014   a. 06/2014 MV: possible reversible inferior wall defect,no wma, nl EF, felt to be artifact.   Basal cell carcinoma    "LLE; some on my head"   BPH (benign prostatic hyperplasia)    Cerebral aneurysm 1994   Constipation    Depression    DVT (deep venous thrombosis) ([place]) 2001   Hx of Left leg    Dysrhythmia    AFIB    Frequency of urination    GERD (gastroesophageal reflux disease)    History of echocardiogram    a. 2D ECHO: 06/11/2014: EF 55-60%. Normal wall thickness. Indeterminate DD ( atrial flutter ). No RWMA. Mild LA dilation. Normal RV size and systolic function. No significant valvular abnormalities.   History of palpitations  OCCASIONAL   Kidney stones    Lung nodule    a. 02/2016 PET scan: intensely hypermetabolic 2.0 x 1.5 cm central RLL nodule (SUV 23).   Paroxysmal atrial flutter ([place])    a. On eliquis and sotalol (CHA2DS2VASc = 1).   Rheumatoid arthritis ([place])    "qwhere"   Stroke ([place]) 1994   "when I had ruptured aneurysm in my head"   TIA (transient ischemic attack)    1994 WITH ANEYRYSM    Past Surgical History:  Procedure Laterality Date   BASAL CELL CARCINOMA EXCISION     "LLE; top of my head"   CEREBRAL ANEURYSM  REPAIR  1994   Hx of ruptured brain aneurysm Tx by Dr. Ellene Route   CYSTOSCOPY W/ STONE MANIPULATION  1980's X 1   EXCISIONAL HEMORRHOIDECTOMY  1970's   INTRAMEDULLARY (IM) NAIL INTERTROCHANTERIC Right 01/03/2022   Procedure: INTRAMEDULLARY (IM) NAIL INTERTROCHANTRIC; ASPIRATION OF RIGHT KNEE EFFUSION AND INJECTION.;  Surgeon: Meredith Pel, MD;  Location: [facility];  Service: Orthopedics;  Laterality: Right;   JOINT REPLACEMENT     LT KNEE   TOTAL KNEE ARTHROPLASTY Left 05/14/2014   Procedure: LEFT TOTAL KNEE ARTHROPLASTY;  Surgeon: Johnn Hai, MD;  Location: [facility];  Service: Orthopedics;  Laterality: Left;   VIDEO ASSISTED THORACOSCOPY (VATS)/WEDGE RESECTION Right 05/08/2016   Procedure: VIDEO ASSISTED THORACOSCOPY (VATS)/LUNG RESECTION;  Surgeon: Ivin Poot, MD;  Location: [facility];  Service: Thoracic;  Laterality: Right;     Home Medications:  Prior to Admission medications   Medication Sig Start Date End Date Taking? Authorizing Provider  acetaminophen (TYLENOL) 500 MG tablet Take 1,000 mg by mouth every 6 (six) hours as needed for moderate pain.   Yes [provider]  albuterol (VENTOLIN HFA) 108 (90 Base) MCG/ACT inhaler TAKE 2 PUFFS BY MOUTH EVERY 6 HOURS AS NEEDED FOR WHEEZE OR SHORTNESS OF BREATH Patient taking differently: Inhale 2 puffs into the lungs every 6 (six) hours as needed for shortness of breath or wheezing. 09/20/21  Yes Collene Gobble, MD  budesonide-formoterol [facility]) 160-4.5 MCG/ACT inhaler Inhale 2 puffs into the lungs 2 (two) times daily. 01/27/22  Yes Ghimire, Henreitta Leber, MD  Cholecalciferol (VITAMIN D3 PO) Take 2,000 mcg by mouth in the morning and at bedtime.   Yes [provider]  diltiazem (CARDIZEM CD) 180 MG 24 hr capsule Take 1 capsule (180 mg total) by mouth daily. 01/09/22 06/11/23 Yes Barb Merino, MD  docusate sodium (COLACE) 100 MG capsule Take 100 mg by mouth at bedtime.   Yes [provider]  ELIQUIS 5 MG TABS tablet  TAKE 1 TABLET BY MOUTH TWICE A DAY Patient taking differently: Take 5 mg by mouth 2 (two) times daily. 11/21/21  Yes Merriel Zinger, MD  folic acid (FOLVITE) 1 MG tablet Take 1 mg by mouth every morning.    Yes [provider]  furosemide (LASIX) 40 MG tablet Take 1 tablet (40 mg total) by mouth daily. 01/28/22  Yes Ghimire, Henreitta Leber, MD  ipratropium-albuterol (DUONEB) 0.5-2.5 (3) MG/3ML SOLN Take 3 mLs by nebulization every 6 (six) hours as needed. 01/27/22  Yes Ghimire, Henreitta Leber, MD  lactulose (CHRONULAC) 10 GM/15ML solution Take 30 mLs (20 g total) by mouth 2 (two) times daily. Patient taking differently: Take 20 g by mouth daily as needed for moderate constipation. 01/08/22  Yes Barb Merino, MD  lansoprazole (PREVACID) 30 MG capsule Take 30 mg by mouth every morning.   Yes [provider]  methocarbamol (ROBAXIN) 500 MG tablet Take 1 tablet (500 mg total) by mouth every 6 (six) hours as needed for muscle spasms. 01/06/22  Yes Meredith Pel, MD  methotrexate 2.5 MG tablet Take 15 mg by mouth every Monday. Take 5 tablets  Caution: Chemotherapy. Protect from light.   Yes [provider]  Multiple Vitamins-Minerals (CENTRUM SILVER ADULT 50+) TABS Take 1 tablet by mouth daily.   Yes [provider]  oxyCODONE (OXY IR/ROXICODONE) 5 MG immediate release tablet Take 1 tablet (5 mg total) by mouth daily as needed for severe pain. 03/20/22  Yes Magnant, Charles L, PA-C  potassium chloride SA (KLOR-CON M) 20 MEQ tablet Take 1 tablet (20 mEq total) by mouth daily. 01/27/22  Yes Ghimire, Henreitta Leber, MD  tiotropium (SPIRIVA HANDIHALER) 18 MCG inhalation capsule Place 1 capsule (18 mcg total) into inhaler and inhale daily. 01/27/22 01/27/23 Yes Ghimire, Henreitta Leber, MD  albuterol (PROVENTIL) (2.5 MG/3ML) 0.083% nebulizer solution Take 3 mLs (2.5 mg total) by nebulization every 6 (six) hours as needed for wheezing or shortness of breath. 02/28/22   Collene Gobble, MD    Inpatient  Medications: Scheduled Meds:  apixaban  5 mg Oral BID   diltiazem  180 mg Oral Daily   docusate sodium  100 mg Oral Daily   folic acid  1 mg Oral q morning   mometasone-formoterol  2 puff Inhalation BID   multivitamin with minerals  1 tablet Oral Daily   pantoprazole  20 mg Oral Daily   sodium chloride flush  3 mL Intravenous Q12H   umeclidinium bromide  1 puff Inhalation Daily   Continuous Infusions:  PRN Meds: acetaminophen **OR** acetaminophen, acetaminophen, albuterol, ipratropium-albuterol, ondansetron **OR** ondansetron (ZOFRAN) IV  Allergies:   No Known Allergies  Social History:   Social History   Socioeconomic History   Marital status: Married    Spouse name: Not on file   Number of children: Not on file   Years of education: Not on file   Highest education level: Not on file  Occupational History   Not on file  Tobacco Use   Smoking status: Former    Packs/day: 2.50    Years: 37.00    Total pack years: 92.50    Types: Cigarettes    Quit date: 09/25/1992    Years since quitting: 29.7   Smokeless tobacco: Never  Vaping Use   Vaping Use: Never used  Substance and Sexual Activity   Alcohol use: No    Alcohol/week: 0.0 standard drinks of alcohol   Drug use: No   Sexual activity: Not Currently  Other Topics Concern   Not on file  Social History Narrative   Not on file   Social Determinants of Health   Financial Resource Strain: Not on file  Food Insecurity: Not on file  Transportation Needs: Not on file  Physical Activity: Not on file  Stress: Not on file  Social Connections: Not on file  Intimate Partner Violence: Not on file    Family History:    Family History  Problem Relation Age of Onset   Heart disease Father        Aneurysm   Diabetes Father    Heart attack Father    Heart disease Brother        Heart Disease before age 3   Cancer Brother    Hypertension Neg Hx    Stroke Neg Hx      ROS:  Please see the history of  present  illness.  All other ROS reviewed and negative.     Physical Exam/Data:   Vitals:   06/11/22 1015 06/11/22 1030 06/11/22 1045 06/11/22 1102  BP: 118/67 119/73 124/69   Pulse: 87 87 88   Resp: 18 18 16    Temp:    99 F (37.2 C)  TempSrc:    Oral  SpO2: 100% 100% 99%   Weight:      Height:       No intake or output data in the 24 hours ending 06/11/22 1117    06/10/2022    6:37 PM 03/16/2022    9:00 AM 02/28/2022    2:47 PM  Last 3 Weights  Weight (lbs) 230 lb 220 lb 220 lb  Weight (kg) 104.327 kg 99.791 kg 99.791 kg     Body mass index is 33.97 kg/m.  General:  Well nourished, well developed, in no acute distress obese HEENT: normal Neck:  JVD to the angle of the jaw bilaterally Vascular: No carotid bruits; Distal pulses 2+ bilaterally Cardiac:  normal S1, S2; RRR; no murmur  Lungs:  clear to auscultation bilaterally, no wheezing, rhonchi or rales  Abd: soft, nontender, no hepatomegaly  Ext: 3+ edema with a few blistering lesions on the shins of both lower extremities, almost to the knees bilaterally Musculoskeletal:  No deformities, BUE and BLE strength normal and equal Skin: warm and dry  Neuro:  CNs 2-12 intact, no focal abnormalities noted Psych:  Normal affect   EKG:  The EKG (during this admission and the hospitalization in April 2023) was personally reviewed and demonstrates: Atrial flutter with variable AV block Telemetry:  Telemetry was personally reviewed and demonstrates: Atrial flutter with consistent 3: 1 AV block and a ventricular rate of 88 bpm.  Relevant CV Studies: Echocardiogram pending   Laboratory Data:  High Sensitivity Troponin:   Recent Labs  Lab 06/10/22 1836 06/10/22 2037 06/11/22 0116 06/11/22 0429  TROPONINIHS 14 16 16 15      Chemistry Recent Labs  Lab 06/10/22 1836 06/10/22 1855 06/10/22 1858 06/11/22 0321  NA 140 139 138 137  K 3.8 3.6 3.7 3.7  CL 99  --  99  --   CO2 28  --   --   --   GLUCOSE 109*  --  106*  --   BUN 13   --  13  --   CREATININE 1.53*  --  1.50*  --   CALCIUM 8.9  --   --   --   GFRNONAA 46*  --   --   --   ANIONGAP 13  --   --   --     Recent Labs  Lab 06/10/22 1836  PROT 6.3*  ALBUMIN 2.9*  AST 20  ALT 12  ALKPHOS 38  BILITOT 1.5*   Lipids No results for input(s): "CHOL", "TRIG", "HDL", "LABVLDL", "LDLCALC", "CHOLHDL" in the last 168 hours.  Hematology Recent Labs  Lab 06/10/22 1836 06/10/22 1855 06/10/22 1858 06/11/22 0321  WBC 8.9  --   --   --   RBC 3.85*  --   --   --   HGB 10.2* 11.6* 12.2* 10.9*  HCT 34.7* 34.0* 36.0* 32.0*  MCV 90.1  --   --   --   MCH 26.5  --   --   --   MCHC 29.4*  --   --   --   RDW 24.2*  --   --   --  PLT 331  --   --   --    Thyroid  Recent Labs  Lab 06/11/22 0116  TSH 2.479    BNP Recent Labs  Lab 06/10/22 1837  BNP 224.2*    DDimer No results for input(s): "DDIMER" in the last 168 hours.   Radiology/Studies:  EEG adult  Result Date: 06/11/2022 Greta Doom, MD     06/11/2022 11:16 AM History: 81 year old male being evaluated for syncope Sedation: None Technique: This EEG was acquired with electrodes placed according to the International 10-20 electrode system (including Fp1, Fp2, F3, F4, C3, C4, P3, P4, O1, O2, T3, T4, T5, T6, A1, A2, Fz, Cz, Pz). The following electrodes were missing or displaced: none. Background: There is some slowing of the background with the posterior dominant rhythm having a frequency of 7 to 8 Hz.  In addition, there is mild intrusion of generalized irregular delta and theta range activities into the waking background as well.  Sleep is not recorded.  No epileptiform discharges were seen. Photic stimulation: Physiologic driving is now performed EEG Abnormalities: 1) generalized irregular slow activity 2) slow posterior dominant rhythm Clinical Interpretation: This EEG is consistent with a mild generalized nonspecific cerebral dysfunction (encephalopathy). There was no seizure or seizure  predisposition recorded on this study. Please note that lack of epileptiform activity on EEG does not preclude the possibility of epilepsy. Roland Rack, MD [facility] [phone] If 7pm- 7am, please page neurology on call as listed in [place].   CT Angio Chest PE W and/or Wo Contrast  Result Date: 06/10/2022 CLINICAL DATA:  Positive D-dimer. EXAM: CT ANGIOGRAPHY CHEST WITH CONTRAST TECHNIQUE: Multidetector CT imaging of the chest was performed using the standard protocol during bolus administration of intravenous contrast. Multiplanar CT image reconstructions and MIPs were obtained to evaluate the vascular anatomy. RADIATION DOSE REDUCTION: This exam was performed according to the departmental dose-optimization program which includes automated exposure control, adjustment of the mA and/or kV according to patient size and/or use of iterative reconstruction technique. CONTRAST:  62mL OMNIPAQUE IOHEXOL 350 MG/ML SOLN COMPARISON:  Chest CT 02/23/2022 FINDINGS: Cardiovascular: Satisfactory opacification of the pulmonary arteries to the segmental level. No evidence of pulmonary embolism. Normal heart size. No pericardial effusion. There are atherosclerotic calcifications of the aorta and coronary arteries. Mediastinum/Nodes: There surgical clips in the right hilar region. There are nonenlarged and enlarged right hilar lymph nodes measuring up to 16 mm. This appears unchanged. No new enlarged lymph nodes are seen. Visualized thyroid gland and esophagus are within normal limits. Lungs/Pleura: Severe emphysematous changes are again seen. There are small bilateral pleural effusions which are similar to the prior examination. There is some ground-glass opacities in the bilateral lower lungs, also unchanged. Scarring along the right major fissure is unchanged from prior. There is no new focal lung infiltrate or pneumothorax. Upper Abdomen: 4 cm left renal cyst is partially visualized. Rounded  hyperdensity in the left kidney is too small to characterize and unchanged, possibly a complex cysts. Small right renal cyst is not well seen secondary to artifact. Musculoskeletal: No chest wall abnormality. No acute or significant osseous findings. Review of the MIP images confirms the above findings. IMPRESSION: 1. No evidence for pulmonary embolism. 2. Stable small pleural effusions. 3. Stable ground-glass opacities in the bilateral lower lungs which may represent infection/inflammation or edema. 4. Stable severe emphysema. 5. Multiple lesions, including 4 cm left Bosniak 1 benign simple cyst. No follow-up imaging is recommended. JACR 2018 Feb; 264-273, Management of the  Incidental RenalMass on CT, [employer] 2021; 814-848, Bosniak Classification of Cystic Renal Masses, Version 2019. Aortic Atherosclerosis (ICD10-I70.0) and Emphysema (ICD10-J43.9). Electronically Signed   By: Ronney Asters M.D.   On: 06/10/2022 23:04   CT Head Wo Contrast  Result Date: 06/10/2022 CLINICAL DATA:  Altered mental status EXAM: CT HEAD WITHOUT CONTRAST TECHNIQUE: Contiguous axial images were obtained from the base of the skull through the vertex without intravenous contrast. RADIATION DOSE REDUCTION: This exam was performed according to the departmental dose-optimization program which includes automated exposure control, adjustment of the mA and/or kV according to patient size and/or use of iterative reconstruction technique. COMPARISON:  None Available. FINDINGS: Brain: No evidence of acute infarction, hemorrhage, hydrocephalus, extra-axial collection or mass lesion/mass effect. Chronic atrophic changes are noted as well as chronic white matter ischemic change. Findings of prior aneurysm clipping noted in the posterior fossa on the left. Vascular: No hyperdense vessel or unexpected calcification. Skull: Postsurgical changes are noted in the occipital bone on the left consistent with prior aneurysm clipping. Sinuses/Orbits: No  acute finding. Other: None. IMPRESSION: Chronic atrophic and ischemic changes without acute abnormality. Electronically Signed   By: Inez Catalina M.D.   On: 06/10/2022 22:58   DG Chest Portable 1 View  Result Date: 06/10/2022 CLINICAL DATA:  Shortness of breath EXAM: PORTABLE CHEST 1 VIEW COMPARISON:  06/08/2022 CT FINDINGS: Cardiac shadow is enlarged but stable. Aortic calcifications are noted. Diffuse emphysematous changes are noted with fibrosis similar to that seen on prior CT examination. The prior surgical changes are again seen in the right infrahilar region. Area of increased density in the right upper lobe seen on prior CT examination has resolved in the interval. No sizable effusion is noted. No bony abnormality is seen. IMPRESSION: Emphysematous changes with underlying fibrotic change. No acute abnormality noted. Electronically Signed   By: Inez Catalina M.D.   On: 06/10/2022 19:34   CT Super D Chest Wo Contrast  Result Date: 06/09/2022 CLINICAL DATA:  Follow-up ground-glass opacities and multifocal areas of consolidation noted on previous CT. History of lung cancer. Status post VATS and wedge resection. EXAM: CT CHEST WITHOUT CONTRAST TECHNIQUE: Multidetector CT imaging of the chest was performed using thin slice collimation for electromagnetic bronchoscopy planning purposes, without intravenous contrast. RADIATION DOSE REDUCTION: This exam was performed according to the departmental dose-optimization program which includes automated exposure control, adjustment of the mA and/or kV according to patient size and/or use of iterative reconstruction technique. body onc. COMPARISON:  02/23/2022 FINDINGS: Cardiovascular: The heart size appears within normal limits. Trace pericardial fluid noted. Aortic atherosclerosis and coronary artery calcifications. Mediastinum/Nodes: Thyroid gland, trachea and esophagus are unremarkable. No enlarged axillary or mediastinal lymph nodes. Lungs/Pleura: Small bilateral  pleural effusions, left greater than right. Central airways appear patent. Severe centrilobular and paraseptal emphysema. Postsurgical change within the anterior basal right lower lobe. Persistent, mild diffuse ground-glass attenuation is noted throughout both lungs. Peripheral and basilar predominant interstitial thickening is noted which may reflect mild changes of CHF. Significant improvement in airspace disease involving the left lower lobe and posterior basal right upper lobe. Residual postinflammatory changes noted within the right lung including subpleural thickening along the posterior aspect of the major fissure within the right upper lobe and subpleural posterior right lower lobe, image 59/6. Previous irregular nodule within the posterior left upper lobe has resolved in the interval (image 40/8 of the previous study). Scattered small lung nodules are stable from 03/15/2021. This includes: Right middle lobe lung nodule measures 4 mm,  image 113/8. Stable 4 mm nodule within the right upper lobe, image 92/8. Peripheral nodule within the left upper lobe is stable measuring 3 mm, image 78/8. Upper Abdomen: No acute abnormality. Unchanged appearance of bilateral kidney cysts compatible with Bosniak class 1 and 2 lesions. No follow-up imaging recommended for these lesions. Musculoskeletal: No acute or suspicious osseous findings. IMPRESSION: 1. Partial clearing of airspace disease involving the posterior basal right upper lobe and posterior right lower lobe compatible with resolving inflammation/infection. Residual subpleural thickening is noted along the posterior major fissure and subpleural right lower lobe 2. Previous irregular nodule within the posterior left upper lobe has resolved in the interval. 3. Additional small scattered lung nodules are unchanged from 03/15/2021. These all measure less than 5 mm and likely reflect a benign process. 4. Mild diffuse ground-glass attenuation, interlobular septal  thickening and small bilateral pleural effusions. Correlate for any clinical signs or symptoms of CHF. 5. Coronary artery calcifications. 6. Aortic Atherosclerosis (ICD10-I70.0) and Emphysema (ICD10-J43.9). Electronically Signed   By: Kerby Moors M.D.   On: 06/09/2022 14:37     Assessment and Plan:   1. Unresponsive episode/syncope Strongly suspect that this was an episode of severe hypoxemia leading to loss of consciousness and possible seizure, but cannot exclude arrhythmic syncope, possibly related to postconversion pause.  Recommended implantation of a loop recorder.  He expresses a lot of reluctance and skepticism, since this will not help his breathing in any way.  2. Atrial flutter Persistent atrial flutter since admission, also seen during his previous hospitalization in April, including on the ECG performed on 01/24/2022, after he had previously undergone cardioversion on 01/20/2022.  It is quite possible that he is in persistent atrial flutter all the time now.  In the past he responded well to sotalol, but this medication had to be stopped due to sinus bradycardia.  He declined pacemaker implantation repeatedly.  At this point, it is unknown how much the arrhythmia is actually contributing to his poor functional status.  Uncertain how well rate has been controlled as an outpatient.  Repeating echo, it is possible that he has tachycardia related cardiomyopathy if his atrial flutter has not been well rate controlled.  Several options are available for treatment including continued rate control with diltiazem (if EF is normal), initiation of dofetilide antiarrhythmic therapy which would necessitate a minimum of 72 hours in the hospital, even referral for radiofrequency ablation (this was discussed with our EP specialist who thought he be a poor candidate due to his comorbid conditions).  He is on chronic anticoagulation.  3. Chronic Obstructive Pulmonary Disease  PFTs performed on 01/12/2021  were really not that bad, with an FEV1 that was roughly 75% of predicted.  His deterioration was marked following his hospitalization with respiratory distress from rhinovirus infection in April 2023.  He has not had repeat pulmonary function test since then.  Severe emphysematous changes are seen on CT of the chest, as well as some groundglass opacities and small bilateral pleural effusions.  4. A/c hypoxic respiratory failure Suspect this is primarily due to his emphysema/chronic bronchitis.  While he does have evidence of hypervolemia, he has exclusively signs of right heart failure (severe peripheral edema and elevated jugular venous pulsations).  No rales are heard on lung exam.  Multiple echocardiograms including the most recent one performed in April of this year showed normal left ventricular systolic function.  Diastolic dysfunction hard to assess since he has atrial flutter.  Suspect that his swelling is related  to chronic cor pulmonale.  Repeat echo has been ordered.  Treat with diuretics to avoid complications related to chronic severe edema of the lower extremities, but not necessarily to achieve complete resolution of edema since this will lead to worsening renal function.  5. Abdominal aortic aneurysm/aortic atherosclerosis Stable size abdominal aortic aneurysm based on most recent ultrasound from June 2023.  Excellent cholesterol profile on statin.  6. Coronary Ca2+ Denies angina pectoris.  Normal cardiac enzymes x4.  Nonischemic ECG.  Based on imaging studies, he likely has CAD, but since this is not symptomatic no evaluation is indicated at this time.   Risk Assessment/Risk Scores:          CHA2DS2-VASc Score = 3   This indicates a 3.2% annual risk of stroke. The patient's score is based upon: CHF History: 0 HTN History: 0 Diabetes History: 0 Stroke History: 0 Vascular Disease History: 1 Age Score: 2 Gender Score: 0         For questions or updates, please contact  [place] Please consult [email or web address] for contact info under    Signed, Richardson Dopp, PA-C  06/11/2022 11:17 AM

## 2022-06-11 NOTE — Progress Notes (Signed)
[facility]  PROGRESS NOTE  Jeffrey Stephens IZT:[id number] DOB: 24-Sep-1941 DOA: 06/10/2022 PCP: Lavone Orn, MD   Brief HPI:   81 year old male with medical history of atrial flutter on Eliquis, VTE, lung cancer s/p lobectomy in 2017, rheumatoid arthritis on methotrexate, COPD on home O2 4 L/min presented to ED with complaints of syncope with prolonged unresponsiveness.  On EMS arrival patient was noted to be hypoxemic with O2 sats in 70s on oxygen on 4 L/min via nasal cannula.  Patient had 3 episodes of syncope in past 3 weeks.  There was no history of prodrome.  No bowel or bladder incontinence, no seizure-like activity. CT head was unremarkable, CTA chest showed no pulmonary embolism.   Subjective   Patient seen and examined, complains of shortness of breath.   Assessment/Plan:    Syncope -Patient has history of orthostatic hypotension -Patient was found to be in atrial flutter -EEG has been ordered -MRI brain ordered to rule out  stroke, carotid duplex done, will follow the results -We will consult cardiology for further recommendations  Acute on chronic hypoxemic respiratory failure -Likely due to underlying diastolic heart failure -He does have bilateral crackles on auscultation -Takes Lasix at home -We will give 1 dose of Lasix 40 mg IV -Continue oxygen 5 L/min via nasal cannula  Acute kidney injury -Baseline creatinine around 0.9 -Presented with creatinine of 1.5 -Was started on IV fluids, will discontinue IV fluids at this time as patient started on Lasix as above  Atrial flutter -Continue diltiazem; heart rate controlled -Continue Eliquis  History of VTE -Continue Eliquis for anticoagulation  COPD -Patient on home oxygen 4 L/min -Does not appear to be in exacerbation  Rheumatoid arthritis -Continue methotrexate  Lung cancer s/p lobectomy in 2017 -Stable   Medications     apixaban  5 mg Oral BID   diltiazem  180 mg Oral Daily   docusate  sodium  100 mg Oral Daily   folic acid  1 mg Oral q morning   mometasone-formoterol  2 puff Inhalation BID   multivitamin with minerals  1 tablet Oral Daily   pantoprazole  20 mg Oral Daily   sodium chloride flush  3 mL Intravenous Q12H   umeclidinium bromide  1 puff Inhalation Daily     Data Reviewed:   CBG:  Recent Labs  Lab 06/10/22 1835 06/11/22 0639  GLUCAP 102* 93    SpO2: 96 % O2 Flow Rate (L/min): 5 L/min    Vitals:   06/11/22 1102 06/11/22 1130 06/11/22 1145 06/11/22 1200  BP:  (!) 140/66 134/77 (!) 141/71  Pulse:  87 88 88  Resp:  (!) 22 17 18   Temp: 99 F (37.2 C)     TempSrc: Oral     SpO2:  94% 99% 96%  Weight:      Height:          Data Reviewed:  Basic Metabolic Panel: Recent Labs  Lab 06/10/22 1836 06/10/22 1855 06/10/22 1858 06/11/22 0321 06/11/22 1055  NA 140 139 138 137 137  K 3.8 3.6 3.7 3.7 4.1  CL 99  --  99  --  99  CO2 28  --   --   --  30  GLUCOSE 109*  --  106*  --  109*  BUN 13  --  13  --  12  CREATININE 1.53*  --  1.50*  --  1.49*  CALCIUM 8.9  --   --   --  8.6*  CBC: Recent Labs  Lab 06/10/22 1836 06/10/22 1855 06/10/22 1858 06/11/22 0321  WBC 8.9  --   --   --   NEUTROABS 7.4  --   --   --   HGB 10.2* 11.6* 12.2* 10.9*  HCT 34.7* 34.0* 36.0* 32.0*  MCV 90.1  --   --   --   PLT 331  --   --   --     LFT Recent Labs  Lab 06/10/22 1836  AST 20  ALT 12  ALKPHOS 38  BILITOT 1.5*  PROT 6.3*  ALBUMIN 2.9*     Antibiotics: Anti-infectives (From admission, onward)    None        DVT prophylaxis: Apixaban  Code Status: Full code  Family Communication: No family at bedside   CONSULTS    Objective    Physical Examination:   General-appears in no acute distress Heart-S1-S2, regular, no murmur auscultated Lungs-bibasilar crackles auscultated Abdomen-soft, nontender, no organomegaly Extremities-bilateral 3+ pitting edema of the lower extremities Neuro-alert, oriented x3, no focal  deficit noted  Status is: Inpatient:          [place]   [facility] If 7PM-7AM, please contact night-coverage at [email or web address], Office  [phone]   06/11/2022, 1:50 PM  LOS: 0 days

## 2022-06-11 NOTE — Evaluation (Signed)
Physical Therapy Evaluation Patient Details Name: Jeffrey Stephens MRN: [id number] DOB: 20-Aug-1941 Today's Date: 06/11/2022  History of Present Illness  81 year old male with medical history of atrial flutter on Eliquis, VTE, lung cancer s/p lobectomy in 2017, rheumatoid arthritis on methotrexate, COPD on home O2 4 L/min presented to ED with complaints of syncope with prolonged unresponsiveness.  On EMS arrival patient was noted to be hypoxemic with O2 sats in 70s on oxygen on 4 L/min via nasal cannula.  Patient had 3 episodes of syncope in past 3 weeks.  Clinical Impression   Pt admitted with above diagnosis. Lives at home with wife and help from adult children, in a single-level home with 4 steps to enter; Prior to admission, pt was able to walk household/room-to-room distances with RW; but had 3 falls in the week leading up to this admission; Presents to PT with generalized weakness, decr activity tolerance, with significant BP drop in standing preceded by dizziness;  Got up to Edge of stretcher with light mod assist, and able to stand to RW with min assist; Unable to stand long enough for a standing BP read, though, had to sit prematurely; Hopeful that as he is medically optimized we will see a big improvement in activity tolerance, and functional mobility; Pt currently with functional limitations due to the deficits listed below (see PT Problem List). Pt will benefit from skilled PT to increase their independence and safety with mobility to allow discharge to the venue listed below.          Recommendations for follow up therapy are one component of a multi-disciplinary discharge planning process, led by the attending physician.  Recommendations may be updated based on patient status, additional functional criteria and insurance authorization.  Follow Up Recommendations Home health PT (Depends on progress/ has home support; if slow progress though, must consider post-acute rehab)       Assistance Recommended at Discharge Intermittent Supervision/Assistance  Patient can return home with the following  A lot of help with walking and/or transfers;Assistance with cooking/housework;Assist for transportation;Help with stairs or ramp for entrance    Equipment Recommendations Rollator (4 wheels) (does he have a bari rollator?)  Recommendations for Other Services  OT consult    Functional Status Assessment Patient has had a recent decline in their functional status and demonstrates the ability to make significant improvements in function in a reasonable and predictable amount of time.     Precautions / Restrictions Precautions Precautions: Fall Precaution Comments: monitor orthostatics and O2 sats      Mobility  Bed Mobility Overal bed mobility: Needs Assistance Bed Mobility: Supine to Sit, Sit to Supine     Supine to sit: Mod assist Sit to supine: Mod assist   General bed mobility comments: Light mod handheld assist for LEs and to pull to sit up on edge of stretcher; light mod assist to help LEs back into bed    Transfers Overall transfer level: Needs assistance Equipment used: Rolling walker (2 wheels) Transfers: Sit to/from Stand Sit to Stand: Min assist           General transfer comment: min assist to rise and steady; Became dizzy standing, and had to sit before getting BP reading    Ambulation/Gait               General Gait Details: deferred  Stairs            Wheelchair Mobility    Modified Rankin (Stroke Patients Only)  Balance Overall balance assessment: Needs assistance   Sitting balance-Leahy Scale: Fair       Standing balance-Leahy Scale: Poor                               Pertinent Vitals/Pain Pain Assessment Pain Assessment: No/denies pain    Home Living Family/patient expects to be discharged to:: Private residence Living Arrangements: Spouse/significant other;Children (daughter, [name], in  the home) Available Help at Discharge: Family;Available 24 hours/day Type of Home: House Home Access: Stairs to enter;Ramped entrance Entrance Stairs-Rails: Right;Left Entrance Stairs-Number of Steps: 4   Home Layout: One level Home Equipment: Conservation officer, nature (2 wheels);BSC/3in1;Wheelchair - manual Additional Comments: Sleeps in recliner at home    Prior Function Prior Level of Function : Needs assist       Physical Assist : Mobility (physical) Mobility (physical): Gait   Mobility Comments: Gait with rollator at home; had a fall in April, TWB with slow recovery; currently walks short in-home distances       Hand Dominance   Dominant Hand: Right    Extremity/Trunk Assessment   Upper Extremity Assessment Upper Extremity Assessment: Defer to OT evaluation    Lower Extremity Assessment Lower Extremity Assessment: Generalized weakness       Communication   Communication: HOH  Cognition Arousal/Alertness: Awake/alert Behavior During Therapy: WFL for tasks assessed/performed Overall Cognitive Status: Within Functional Limits for tasks assessed                                          General Comments General comments (skin integrity, edema, etc.): Noted O2 sat drop to 83% with good wave with activity; increased supplemental O2 to 5L and notifed [place], RN;   06/11/22 1451  Orthostatic Lying   BP- Lying 112/58  Pulse- Lying 89  Orthostatic Sitting  BP- Sitting 103/61  Pulse- Sitting 89  Orthostatic Standing at 0 minutes  BP- Standing at 0 minutes (!) (S)  75/45 (Unable to stand long enough for BP, BP completed after sitting)  Pulse- Standing at 0 minutes 95  Orthostatic Standing at 3 minutes  BP- Standing at 3 minutes  (unable to stand long enough for 3 minutes)       Exercises     Assessment/Plan    PT Assessment Patient needs continued PT services  PT Problem List Decreased strength;Decreased activity tolerance;Decreased  balance;Decreased mobility;Decreased coordination;Decreased knowledge of use of DME;Decreased safety awareness;Decreased knowledge of precautions;Cardiopulmonary status limiting activity       PT Treatment Interventions DME instruction;Gait training;Stair training;Functional mobility training;Therapeutic activities;Therapeutic exercise;Balance training;Neuromuscular re-education;Cognitive remediation;Patient/family education    PT Goals (Current goals can be found in the Care Plan section)  Acute Rehab PT Goals Patient Stated Goal: be able to get up without worrying about falling PT Goal Formulation: With patient/family Time For Goal Achievement: 07/07/2022 Potential to Achieve Goals: Good    Frequency Min 3X/week     Co-evaluation               AM-PAC PT "6 Clicks" Mobility  Outcome Measure Help needed turning from your back to your side while in a flat bed without using bedrails?: A Little Help needed moving from lying on your back to sitting on the side of a flat bed without using bedrails?: A Lot Help needed moving to and from a bed to a  chair (including a wheelchair)?: A Lot Help needed standing up from a chair using your arms (e.g., wheelchair or bedside chair)?: A Little Help needed to walk in hospital room?: Total Help needed climbing 3-5 steps with a railing? : Total 6 Click Score: 12    End of Session Equipment Utilized During Treatment: Oxygen Activity Tolerance: Other (comment) (limited by decr O2 sats with activity adn BP drop in standing) Patient left: in bed;with call bell/phone within reach;with family/visitor present Nurse Communication: Mobility status;Other (comment) (and O2 sat drop) PT Visit Diagnosis: Other abnormalities of gait and mobility (R26.89);Repeated falls (R29.6);Muscle weakness (generalized) (M62.81);Dizziness and giddiness (R42)    Time: 8592-7639 PT Time Calculation (min) (ACUTE ONLY): 32 min   Charges:   PT Evaluation $PT Eval Moderate  Complexity: 1 Mod PT Treatments $Therapeutic Activity: 8-22 mins        Roney Marion, PT  Acute Rehabilitation Services Office [phone]   [place] 06/11/2022, 5:51 PM

## 2022-06-11 NOTE — ED Notes (Signed)
This RN reaches out to provider Dr Darrick Meigs regarding a concern from MRI

## 2022-06-12 ENCOUNTER — Other Ambulatory Visit (HOSPITAL_COMMUNITY): Payer: Self-pay

## 2022-06-12 ENCOUNTER — Inpatient Hospital Stay (HOSPITAL_COMMUNITY): Payer: Medicare PPO

## 2022-06-12 ENCOUNTER — Encounter (HOSPITAL_COMMUNITY)
Admission: EM | Disposition: A | Discharge status: Hospice | Payer: Self-pay | Source: Home / Self Care | Attending: Internal Medicine

## 2022-06-12 ENCOUNTER — Telehealth (HOSPITAL_COMMUNITY): Payer: Self-pay | Admitting: Pharmacy Technician

## 2022-06-12 ENCOUNTER — Encounter (HOSPITAL_COMMUNITY): Payer: Self-pay | Admitting: Cardiovascular Disease

## 2022-06-12 DIAGNOSIS — R55 Syncope and collapse: Secondary | ICD-10-CM

## 2022-06-12 DIAGNOSIS — I50812 Chronic right heart failure: Secondary | ICD-10-CM | POA: Diagnosis not present

## 2022-06-12 DIAGNOSIS — I483 Typical atrial flutter: Secondary | ICD-10-CM | POA: Diagnosis not present

## 2022-06-12 DIAGNOSIS — J9621 Acute and chronic respiratory failure with hypoxia: Secondary | ICD-10-CM

## 2022-06-12 DIAGNOSIS — L8990 Pressure ulcer of unspecified site, unspecified stage: Secondary | ICD-10-CM | POA: Insufficient documentation

## 2022-06-12 HISTORY — PX: LOOP RECORDER INSERTION: EP1214

## 2022-06-12 LAB — BASIC METABOLIC PANEL
Anion gap: 5 (ref 5–15)
BUN: 14 mg/dL (ref 8–23)
CO2: 30 mmol/L (ref 22–32)
Calcium: 8.5 mg/dL — ABNORMAL LOW (ref 8.9–10.3)
Chloride: 100 mmol/L (ref 98–111)
Creatinine, Ser: 1.54 mg/dL — ABNORMAL HIGH (ref 0.61–1.24)
GFR, Estimated: 46 mL/min — ABNORMAL LOW (ref >60)
Glucose, Bld: 143 mg/dL — ABNORMAL HIGH (ref 70–99)
Potassium: 3.9 mmol/L (ref 3.5–5.1)
Sodium: 135 mmol/L (ref 135–145)

## 2022-06-12 LAB — CBC
HCT: 31.3 % — ABNORMAL LOW (ref 39.0–52.0)
Hemoglobin: 9.1 g/dL — ABNORMAL LOW (ref 13.0–17.0)
MCH: 26.4 pg (ref 26.0–34.0)
MCHC: 29.1 g/dL — ABNORMAL LOW (ref 30.0–36.0)
MCV: 90.7 fL (ref 80.0–100.0)
Platelets: 278 10*3/uL (ref 150–400)
RBC: 3.45 MIL/uL — ABNORMAL LOW (ref 4.22–5.81)
RDW: 24.4 % — ABNORMAL HIGH (ref 11.5–15.5)
WBC: 9.2 10*3/uL (ref 4.0–10.5)
nRBC: 0.8 % — ABNORMAL HIGH (ref 0.0–0.2)

## 2022-06-12 LAB — ECHOCARDIOGRAM COMPLETE
AR max vel: 3.56 cm2
AV Area VTI: 2.79 cm2
AV Area mean vel: 3.37 cm2
AV Mean grad: 3 mmHg
AV Peak grad: 4.8 mmHg
Ao pk vel: 1.09 m/s
Area-P 1/2: 6.07 cm2
Calc EF: 57.8 %
Height: 69 in
MV M vel: 3.93 m/s
MV Peak grad: 61.8 mmHg
S' Lateral: 2.3 cm
Single Plane A2C EF: 55.1 %
Single Plane A4C EF: 58.5 %
Weight: 3985.92 oz

## 2022-06-12 LAB — COMPREHENSIVE METABOLIC PANEL
ALT: 9 U/L (ref 0–44)
AST: 21 U/L (ref 15–41)
Albumin: 2.5 g/dL — ABNORMAL LOW (ref 3.5–5.0)
Alkaline Phosphatase: 35 U/L — ABNORMAL LOW (ref 38–126)
Anion gap: 7 (ref 5–15)
BUN: 14 mg/dL (ref 8–23)
CO2: 31 mmol/L (ref 22–32)
Calcium: 8.8 mg/dL — ABNORMAL LOW (ref 8.9–10.3)
Chloride: 101 mmol/L (ref 98–111)
Creatinine, Ser: 1.71 mg/dL — ABNORMAL HIGH (ref 0.61–1.24)
GFR, Estimated: 40 mL/min — ABNORMAL LOW (ref >60)
Glucose, Bld: 108 mg/dL — ABNORMAL HIGH (ref 70–99)
Potassium: 3.9 mmol/L (ref 3.5–5.1)
Sodium: 139 mmol/L (ref 135–145)
Total Bilirubin: 0.9 mg/dL (ref 0.3–1.2)
Total Protein: 5.4 g/dL — ABNORMAL LOW (ref 6.5–8.1)

## 2022-06-12 LAB — C-REACTIVE PROTEIN: CRP: 1.8 mg/dL — ABNORMAL HIGH (ref <1.0)

## 2022-06-12 LAB — MAGNESIUM: Magnesium: 2 mg/dL (ref 1.7–2.4)

## 2022-06-12 LAB — PROCALCITONIN: Procalcitonin: <0.1 ng/mL

## 2022-06-12 LAB — GLUCOSE, CAPILLARY: Glucose-Capillary: 100 mg/dL — ABNORMAL HIGH (ref 70–99)

## 2022-06-12 LAB — SEDIMENTATION RATE: Sed Rate: 8 mm/hr (ref 0–16)

## 2022-06-12 SURGERY — LOOP RECORDER INSERTION

## 2022-06-12 MED ORDER — SODIUM CHLORIDE 0.9 % IV SOLN: 250.0000 mL | INTRAVENOUS | Status: DC | PRN

## 2022-06-12 MED ORDER — POTASSIUM CHLORIDE CRYS ER 20 MEQ PO TBCR
30.0000 meq | EXTENDED_RELEASE_TABLET | Freq: Once | ORAL | Status: AC
  Administered 2022-06-12: 30 meq via ORAL
  Filled 2022-06-12: qty 2

## 2022-06-12 MED ORDER — POTASSIUM CHLORIDE CRYS ER 20 MEQ PO TBCR
40.0000 meq | EXTENDED_RELEASE_TABLET | Freq: Once | ORAL | Status: AC
  Administered 2022-06-12: 40 meq via ORAL
  Filled 2022-06-12: qty 2

## 2022-06-12 MED ORDER — SODIUM CHLORIDE 0.9% FLUSH
3.0000 mL | Freq: Two times a day (BID) | INTRAVENOUS | Status: DC
  Administered 2022-06-12 – 2022-06-15 (×5): 3 mL via INTRAVENOUS

## 2022-06-12 MED ORDER — REVEFENACIN 175 MCG/3ML IN SOLN
175.0000 ug | Freq: Every day | RESPIRATORY_TRACT | Status: DC
  Administered 2022-06-13 – 2022-06-19 (×7): 175 ug via RESPIRATORY_TRACT
  Filled 2022-06-12 (×8): qty 3

## 2022-06-12 MED ORDER — POTASSIUM CHLORIDE CRYS ER 20 MEQ PO TBCR
20.0000 meq | EXTENDED_RELEASE_TABLET | Freq: Every day | ORAL | Status: DC
  Administered 2022-06-13 – 2022-06-14 (×2): 20 meq via ORAL
  Filled 2022-06-12 (×2): qty 1

## 2022-06-12 MED ORDER — LIDOCAINE-EPINEPHRINE 1 %-1:100000 IJ SOLN
INTRAMUSCULAR | Status: DC | PRN
  Administered 2022-06-12: 5 mL

## 2022-06-12 MED ORDER — FUROSEMIDE 10 MG/ML IJ SOLN
40.0000 mg | Freq: Two times a day (BID) | INTRAMUSCULAR | Status: DC
  Administered 2022-06-12 – 2022-06-14 (×4): 40 mg via INTRAVENOUS
  Filled 2022-06-12 (×4): qty 4

## 2022-06-12 MED ORDER — MAGNESIUM SULFATE 2 GM/50ML IV SOLN
2.0000 g | Freq: Once | INTRAVENOUS | Status: AC
  Administered 2022-06-12: 2 g via INTRAVENOUS
  Filled 2022-06-12: qty 50

## 2022-06-12 MED ORDER — DOFETILIDE 250 MCG PO CAPS
250.0000 ug | ORAL_CAPSULE | Freq: Two times a day (BID) | ORAL | Status: DC
  Filled 2022-06-12: qty 1

## 2022-06-12 MED ORDER — ARFORMOTEROL TARTRATE 15 MCG/2ML IN NEBU
15.0000 ug | INHALATION_SOLUTION | Freq: Two times a day (BID) | RESPIRATORY_TRACT | Status: DC
  Administered 2022-06-13 – 2022-06-19 (×13): 15 ug via RESPIRATORY_TRACT
  Filled 2022-06-12 (×14): qty 2

## 2022-06-12 MED ORDER — BUDESONIDE 0.5 MG/2ML IN SUSP
0.5000 mg | Freq: Two times a day (BID) | RESPIRATORY_TRACT | Status: DC
  Administered 2022-06-13 – 2022-06-19 (×13): 0.5 mg via RESPIRATORY_TRACT
  Filled 2022-06-12 (×14): qty 2

## 2022-06-12 MED ORDER — DILTIAZEM HCL ER COATED BEADS 240 MG PO CP24
240.0000 mg | ORAL_CAPSULE | Freq: Every day | ORAL | Status: DC
  Administered 2022-06-13 – 2022-06-16 (×4): 240 mg via ORAL
  Filled 2022-06-12 (×4): qty 1

## 2022-06-12 MED ORDER — FUROSEMIDE 10 MG/ML IJ SOLN: 20.0000 mg | Freq: Two times a day (BID) | INTRAMUSCULAR | Status: DC

## 2022-06-12 MED ORDER — DOFETILIDE 250 MCG PO CAPS
250.0000 ug | ORAL_CAPSULE | Freq: Two times a day (BID) | ORAL | Status: DC
  Administered 2022-06-12 – 2022-06-19 (×14): 250 ug via ORAL
  Filled 2022-06-12 (×14): qty 1

## 2022-06-12 MED ORDER — SODIUM CHLORIDE 0.9% FLUSH: 3.0000 mL | INTRAVENOUS | Status: DC | PRN

## 2022-06-12 MED ORDER — LIDOCAINE-EPINEPHRINE 1 %-1:100000 IJ SOLN
INTRAMUSCULAR | Status: AC
  Filled 2022-06-12: qty 1

## 2022-06-12 MED ORDER — ALBUMIN HUMAN 25 % IV SOLN
25.0000 g | Freq: Four times a day (QID) | INTRAVENOUS | Status: AC
  Administered 2022-06-12 – 2022-06-13 (×3): 25 g via INTRAVENOUS
  Filled 2022-06-12 (×4): qty 100

## 2022-06-12 MED ORDER — MAGNESIUM OXIDE -MG SUPPLEMENT 400 (240 MG) MG PO TABS
400.0000 mg | ORAL_TABLET | Freq: Every day | ORAL | Status: DC
  Administered 2022-06-13 – 2022-06-19 (×7): 400 mg via ORAL
  Filled 2022-06-12 (×7): qty 1

## 2022-06-12 MED ORDER — SODIUM CHLORIDE 0.9% FLUSH
3.0000 mL | Freq: Two times a day (BID) | INTRAVENOUS | Status: DC
  Administered 2022-06-12 – 2022-06-19 (×14): 3 mL via INTRAVENOUS

## 2022-06-12 SURGICAL SUPPLY — 2 items
PACK LOOP INSERTION (CUSTOM PROCEDURE TRAY) ×1 IMPLANT
SYSTEM MONITOR REVEAL LINQ II (Prosthesis & Implant Heart) IMPLANT

## 2022-06-12 NOTE — Telephone Encounter (Signed)
Pharmacy Patient Advocate Encounter  Insurance verification completed.    The patient is insured through [facility]   The patient is currently admitted and ran test claims for the following: Tikosyn.  Copays and coinsurance results were relayed to Inpatient clinical team.

## 2022-06-12 NOTE — Progress Notes (Signed)
OT Cancellation Note  Patient Details Name: Jeffrey Stephens MRN: [id number] DOB: 12-13-1940   Cancelled Treatment:    Reason Eval/Treat Not Completed: Patient at procedure or test/ unavailable (Cath Lab)  Jeri Modena 06/12/2022, 1:15 PM

## 2022-06-12 NOTE — Progress Notes (Signed)
Pharmacy: Dofetilide (Tikosyn) - Initial Consult Assessment and Electrolyte Replacement  Pharmacy consulted to assist in monitoring and replacing electrolytes in this 81 y.o. male admitted on 06/10/2022 undergoing dofetilide initiation. First dofetilide dose scheduled for: 9/18 @ 2000  Assessment:  Patient Exclusion Criteria: If any screening criteria checked as "Yes", then  patient  should NOT receive dofetilide until criteria item is corrected.  If "Yes" please indicate correction plan.  YES  NO Patient  Exclusion Criteria Correction Plan   []   [x]   Baseline QTc interval is greater than or equal to 440 msec. IF above YES box checked dofetilide contraindicated unless patient has ICD; then may proceed if QTc 500-550 msec or with known ventricular conduction abnormalities may proceed with QTc 550-600 msec. QTc = 467ms per cardiologist calculation on 9/18, 45ms on EKG    []   [x]   Patient is known or suspected to have a digoxin level greater than 2 ng/ml: No results found for: "DIGOXIN"     []   [x]   Creatinine clearance less than 20 ml/min (calculated using Cockcroft-Gault, actual body weight and serum creatinine): Estimated Creatinine Clearance: 43.4 mL/min (A) (by C-G formula based on SCr of 1.71 mg/dL (H)).     []   [x]  Patient has received drugs known to prolong the QT intervals within the last 48 hours (phenothiazines, tricyclics or tetracyclic antidepressants, erythromycin, H-1 antihistamines, cisapride, fluoroquinolones, azithromycin, ondansetron).   Updated information on QT prolonging agents is available to be searched on the following database:QT prolonging agents  PRN ondansetron order on [facility] but has not used  - asked MD if ok to d/c.    []   [x]   Patient received a dose of hydrochlorothiazide (Oretic) alone or in any combination including triamterene (Dyazide, Maxzide) in the last 48 hours.    []   [x]  Patient received a medication known to increase dofetilide plasma  concentrations prior to initial dofetilide dose:  Trimethoprim (Primsol, Proloprim) in the last 36 hours Verapamil (Calan, Verelan) in the last 36 hours or a sustained release dose in the last 72 hours Megestrol (Megace) in the last 5 days  Cimetidine (Tagamet) in the last 6 hours Ketoconazole (Nizoral) in the last 24 hours Itraconazole (Sporanox) in the last 48 hours  Prochlorperazine (Compazine) in the last 36 hours     []   [x]   Patient is known to have a history of torsades de pointes; congenital or acquired long QT syndromes.    []   [x]   Patient has received a Class 1 antiarrhythmic with less than 2 half-lives since last dose. (Disopyramide, Quinidine, Procainamide, Lidocaine, Mexiletine, Flecainide, Propafenone)    []   [x]   Patient has received amiodarone therapy in the past 3 months or amiodarone level is greater than 0.3 ng/ml.    Labs:    Component Value Date/Time   K 3.9 06/12/2022 0244   MG 2.2 01/21/2022 0223     Plan: Select One Calculated CrCl  Dose q12h  []  > 60 ml/min 500 mcg  [x]  40-60 ml/min 250 mcg  []  20-40 ml/min 125 mcg   [x]   Physician selected initial dose within range recommended for patients level of renal function - will monitor for response.  []   Physician selected initial dose outside of range recommended for patients level of renal function - will discuss if the dose should be altered at this time.   Patient has been appropriately anticoagulated with Eliquis 5mg  PO BID.  Potassium: K 3.9 - give Kcl 30 mEq PO x1 per cardiology, then begin  Kcl 20 mEq QD on 9/19 Repeat BMET this evening  Magnesium: No Mg level this admit - follow-up Mg level this evening   Thank you for allowing pharmacy to participate in this patient's care   Dimple Nanas, PharmD, BCPS 06/12/2022 1:56 PM

## 2022-06-12 NOTE — Consult Note (Signed)
NAME:  Jeffrey Stephens, MRN:  [id number], DOB:  02-Nov-1940, LOS: 1 ADMISSION DATE:  06/10/2022, CONSULTATION DATE:  06/12/2022 REFERRING MD:  Dr. [name], CHIEF COMPLAINT:  syncope   History of Present Illness:  81 year old male with prior history significant for but not limited to COPD, chronic hypoxic respiratory failure on 4-5L HOT, lung ca s/p RLL lobectomy 2017, atrial flutter and hx of VTE on Eliquis, RA on methotrexate who presented from home after syncopal episode with prolonged altered mental status at home on 9/16.   Most history obtained from daughter at bedside who helps with his care at home.  Reports worsening exertional dyspnea dating back prior to his fall and hip fracture back in February 2023.  Then hospitalized in April 2023 with CAP, rhinovirus, and treated for atrial flutter with RVR.  Daughter reports since that admission, he has required home O2, had ongoing fluid build up in his legs, minimal mobility at home attributed to exertional dyspnea, and trouble with orthostatic hypotension (noted back on cardiology notes in March).  He is followed in our clinic by Dr. Lamonte Sakai, last seen 02/28/22 for follow-up of right sided pneumonia and rhinovirus in April 2023; was sent for follow-up chest CT on 9/14 which showed partial clearing of right sided pna from 12/2021, mild diffuse GG attenuation, interlobular septal thickening, and small bilateral effusions, and some residual small lung nodules less than 69m.   Reports brief syncopal episode after standing up on 9/14 with reported bowel/bladder incontinence.  Daughter then increased his home diuretics over the weekend given his increasing leg edema.  Denies recent fever.  Some chronic cough, intermittently yellow/ clear.  On 9/16, daughter helped him out of recliner with few steps to sit down for dinner.  Noticed he had passed out after sitting down and wasn't breathing and blue so she attempted CPR in chair, afraid to lay him down for CPR in  fears of aspiration.  He was breathing, hypoxic in the 70s, minimally responsive on EMS arrival.  HR in 130's and normotensive.  Alert by arrival to ER but still hypoxic, requiring brief BiPAP and in atrial flutter with RVR.  VBG 7.468/ 42.9.  CTA PE neg for PE, but showed small bilateral pleural effusions, stable bibasilar GGO, and severe emphysema.  [facility]  EEG negative and CTH non acute.  Diuresis started.  Repeat TTE shows EF 60-65 but new moderate RV dysfunction since April 2023.  Plans to start patient on Tikosyn 9/18. Underwent loop recorder placement 9/18.  Pulmonary consulted for possible pulmonary involvement related to syncope and further recommendations.   Pertinent  Medical History  Former smoker, COPD, chronic hypoxic respiratory failure on 4-5L HOT, lung ca s/p RLL lobectomy 2017, atrial flutter and hx of VTE on Eliquis, RA on methotrexate, AAA, aortic atherosclerosis, cerebral aneurysm c/b secondary hemorrhagic stroke s/p clipping in 1994, orthostatic hypotension  Significant Hospital Events: Including procedures, antibiotic start and stop dates in addition to other pertinent events   [facility] Subjective:  Daughter reports patient desaturated getting into recliner earlier but quickly recovers.   Reported to be a mouth breather.    Objective   Blood pressure 109/70, pulse 92, temperature 98 F (36.7 C), temperature source Oral, resp. rate 20, height '5\' 9"'  (1.753 m), weight 113 kg, SpO2 92 %.        Intake/Output Summary (Last 24 hours) at 06/12/2022 1444 Last data filed  at 06/12/2022 1200 Gross per 24 hour  Intake 300 ml  Output 360 ml  Net -60 ml   Filed Weights   06/10/22 1837 06/12/22 0500  Weight: 104.3 kg 113 kg   Examination: General:  chronically ill obese elderly male sitting upright in bed in NAD HEENT: MM pink/moist Neuro: Awake, oriented, appropriate, MAE CV: rr, no  murmur PULM:  non labored, clear and diminished, slight exp wheeze, mild posterior bibasilar rales, L>R Extremities: warm/dry, +3 LE pitting edema   Resolved Hospital Problem list    Assessment & Plan:   Syncope Hx orthostatic hypotension - Likely multifactorial related to chronic hypoxic respiratory failure, severe underlying lung disease, right heart failure with volume overload, pulmonary edema, Atrial flutter with RVR, and overall deconditioning/ debility  - will discuss with cards utility of bubble study r/o PFO - check RUQ [place] to evaluate liver given new right heart failure since April - ongoing education with patient to change positions slowly.  Patient acknowledges non-compliance with this in the past.   Acute on chronic hypoxic respiratory failure  COPD, no AE - CT 9/14> partial clearing of right sided pna from 12/2021. Mild diffuse GG attenuation, interlobular septal thickening, and small bilateral effusions.  Some residual small lung nodules less than 86m.  - CTA PE 9/16> neg for PE, small bilateral pleural effusion, stable bibasilar GGO, severe emphysema  P:  - suspect worsening SOB is multifactorial as well related to above> HF, Aflutter with RVR, deconditioning  - continue supplemental O2 for sat goal 88-95% - chest ESR, CRP given RA hx - Check PCT.  Remains afebrile/ no change in baseline cough - inhalers changed to brovanna, pulmicort, yupelri, and duonebs prn  - PT consult - aggressive pulmonary hygiene/ IS  - encourage patient to avoid mouth breathing while trying to recover from exertional dyspnea/ desaturation   Heart failure/ cor pulmonale  - new since echo 12/2021, EF 60-65, mod RV dysfunction, no PASP noted - increased lasix to 45mBID with albumin x 3 doses.  Scheduled K and Mag.   - strict I/Os/ daily weights  - monitor electrolytes closely given pt starting tikosyn, K>4, Mag>2.  BMET/ Mag in am - consider RHC if able to convert to SR, [place] with  R[place] - per Cards.  S/p loop recorder placement 9/18.   - tele monitoring - Cards starting tikosyn tonight, mandatory 72hr inpt monitoring.  Hx of severe bradycardia with prior sotalol/ pt declined pacemaker.  Cardizem dose decreased.  - Eliquis   Suspected OSA - Snores per family with apneic episodes.  Needs outpt sleep study.  Not interested in inpt sleep study.  Would benefit from weight loss as well.  - CPAP q HS prn   Hx RA on methotrexate   Remainder per primary team.  Pulmonary will continue to follow.   Best Practice (right click and "Reselect all SmartList Selections" daily)  Per primary team.   Labs   CBC: Recent Labs  Lab 06/10/22 1836 06/10/22 1855 06/10/22 1858 06/11/22 0321 06/12/22 0244  WBC 8.9  --   --   --  9.2  NEUTROABS 7.4  --   --   --   --   HGB 10.2* 11.6* 12.2* 10.9* 9.1*  HCT 34.7* 34.0* 36.0* 32.0* 31.3*  MCV 90.1  --   --   --  90.7  PLT 331  --   --   --  27867  Basic Metabolic Panel: Recent Labs  Lab 06/10/22 1836 06/10/22 1855 06/10/22 1858 06/11/22 0321 06/11/22 1055 06/12/22 0244  NA 140 139 138 137 137 139  K 3.8 3.6 3.7 3.7 4.1 3.9  CL 99  --  99  --  99 101  CO2 28  --   --   --  30 31  GLUCOSE 109*  --  106*  --  109* 108*  BUN 13  --  13  --  12 14  CREATININE 1.53*  --  1.50*  --  1.49* 1.71*  CALCIUM 8.9  --   --   --  8.6* 8.8*   GFR: Estimated Creatinine Clearance: 43.4 mL/min (A) (by C-G formula based on SCr of 1.71 mg/dL (H)). Recent Labs  Lab 06/10/22 1836 06/10/22 1837 06/10/22 2037 06/12/22 0244  WBC 8.9  --   --  9.2  LATICACIDVEN  --  1.5 1.1  --     Liver Function Tests: Recent Labs  Lab 06/10/22 1836 06/12/22 0244  AST 20 21  ALT 12 9  ALKPHOS 38 35*  BILITOT 1.5* 0.9  PROT 6.3* 5.4*  ALBUMIN 2.9* 2.5*   No results for input(s): "LIPASE", "AMYLASE" in the last 168 hours. No results for input(s): "AMMONIA" in the last 168 hours.  ABG    Component Value Date/Time   PHART 7.438 06/10/2022  1855   PCO2ART 44.5 06/10/2022 1855   PO2ART 71 (L) 06/10/2022 1855   HCO3 31.1 (H) 06/11/2022 0321   TCO2 32 06/11/2022 0321   ACIDBASEDEF 2.3 (H) 05/09/2016 0520   O2SAT 65 06/11/2022 0321     Coagulation Profile: Recent Labs  Lab 06/10/22 1836  INR 1.9*    Cardiac Enzymes: No results for input(s): "CKTOTAL", "CKMB", "CKMBINDEX", "TROPONINI" in the last 168 hours.  HbA1C: Hgb A1c MFr Bld  Date/Time Value Ref Range Status  06/11/2022 01:16 AM 4.4 (L) 4.8 - 5.6 % Final    Comment:    (NOTE) Pre diabetes:          5.7%-6.4%  Diabetes:              >6.4%  Glycemic control for   <7.0% adults with diabetes   01/21/2022 02:38 AM 5.1 4.8 - 5.6 % Final    Comment:    (NOTE) **Verified by repeat analysis**         Prediabetes: 5.7 - 6.4         Diabetes: >6.4         Glycemic control for adults with diabetes: <7.0     CBG: Recent Labs  Lab 06/10/22 1835 06/11/22 0639 06/12/22 0607  GLUCAP 102* 93 100*    Review of Systems:   As per HPI otherwise negative.   Past Medical History:  He,  has a past medical history of AAA (abdominal aortic aneurysm) ([place]), Abnormal nuclear cardiac imaging test (07/13/2014), Basal cell carcinoma, BPH (benign prostatic hyperplasia), Cerebral aneurysm (1994), Constipation, Depression, DVT (deep venous thrombosis) ([place]) (2001), Dysrhythmia, Frequency of urination, GERD (gastroesophageal reflux disease), History of echocardiogram, History of palpitations, Kidney stones, Lung nodule, Paroxysmal atrial flutter ([place]), Rheumatoid arthritis ([place]), Stroke ([place]) (1994), and TIA (transient ischemic attack).   Surgical History:   Past Surgical History:  Procedure Laterality Date   BASAL CELL CARCINOMA EXCISION     "LLE; top of my head"   CEREBRAL ANEURYSM REPAIR  1994   Hx of ruptured brain aneurysm Tx by Dr. Ellene Route   CYSTOSCOPY W/ STONE MANIPULATION  1980's X 1  EXCISIONAL HEMORRHOIDECTOMY  1970's   INTRAMEDULLARY (IM) NAIL  INTERTROCHANTERIC Right 01/03/2022   Procedure: INTRAMEDULLARY (IM) NAIL INTERTROCHANTRIC; ASPIRATION OF RIGHT KNEE EFFUSION AND INJECTION.;  Surgeon: Meredith Pel, MD;  Location: [facility];  Service: Orthopedics;  Laterality: Right;   JOINT REPLACEMENT     LT KNEE   LOOP RECORDER INSERTION N/A 06/12/2022   Procedure: LOOP RECORDER INSERTION;  Surgeon: Sanda Klein, MD;  Location: [facility];  Service: Cardiovascular;  Laterality: N/A;   TOTAL KNEE ARTHROPLASTY Left 05/14/2014   Procedure: LEFT TOTAL KNEE ARTHROPLASTY;  Surgeon: Johnn Hai, MD;  Location: [facility];  Service: Orthopedics;  Laterality: Left;   VIDEO ASSISTED THORACOSCOPY (VATS)/WEDGE RESECTION Right 05/08/2016   Procedure: VIDEO ASSISTED THORACOSCOPY (VATS)/LUNG RESECTION;  Surgeon: Ivin Poot, MD;  Location: [facility];  Service: Thoracic;  Laterality: Right;     Social History:   reports that he quit smoking about 29 years ago. His smoking use included cigarettes. He has a 92.50 pack-year smoking history. He has never used smokeless tobacco. He reports that he does not drink alcohol and does not use drugs.   Family History:  His family history includes Cancer in his brother; Diabetes in his father; Heart attack in his father; Heart disease in his brother and father. There is no history of Hypertension or Stroke.   Allergies No Known Allergies   Home Medications  Prior to Admission medications   Medication Sig Start Date End Date Taking? Authorizing Provider  acetaminophen (TYLENOL) 500 MG tablet Take 1,000 mg by mouth every 6 (six) hours as needed for moderate pain.   Yes [provider]  albuterol (VENTOLIN HFA) 108 (90 Base) MCG/ACT inhaler TAKE 2 PUFFS BY MOUTH EVERY 6 HOURS AS NEEDED FOR WHEEZE OR SHORTNESS OF BREATH Patient taking differently: Inhale 2 puffs into the lungs every 6 (six) hours as needed for shortness of breath or wheezing. 09/20/21  Yes Collene Gobble, MD  budesonide-formoterol  [facility]) 160-4.5 MCG/ACT inhaler Inhale 2 puffs into the lungs 2 (two) times daily. 01/27/22  Yes Ghimire, Henreitta Leber, MD  Cholecalciferol (VITAMIN D3 PO) Take 2,000 mcg by mouth in the morning and at bedtime.   Yes [provider]  diltiazem (CARDIZEM CD) 180 MG 24 hr capsule Take 1 capsule (180 mg total) by mouth daily. 01/09/22 06/11/23 Yes Barb Merino, MD  docusate sodium (COLACE) 100 MG capsule Take 100 mg by mouth at bedtime.   Yes [provider]  ELIQUIS 5 MG TABS tablet TAKE 1 TABLET BY MOUTH TWICE A DAY Patient taking differently: Take 5 mg by mouth 2 (two) times daily. 11/21/21  Yes Croitoru, Mihai, MD  folic acid (FOLVITE) 1 MG tablet Take 1 mg by mouth every morning.    Yes [provider]  furosemide (LASIX) 40 MG tablet Take 1 tablet (40 mg total) by mouth daily. 01/28/22  Yes Ghimire, Henreitta Leber, MD  ipratropium-albuterol (DUONEB) 0.5-2.5 (3) MG/3ML SOLN Take 3 mLs by nebulization every 6 (six) hours as needed. 01/27/22  Yes Ghimire, Henreitta Leber, MD  lactulose (CHRONULAC) 10 GM/15ML solution Take 30 mLs (20 g total) by mouth 2 (two) times daily. Patient taking differently: Take 20 g by mouth daily as needed for moderate constipation. 01/08/22  Yes Barb Merino, MD  lansoprazole (PREVACID) 30 MG capsule Take 30 mg by mouth every morning.   Yes [provider]  methocarbamol (ROBAXIN) 500 MG tablet Take 1 tablet (500 mg total) by mouth every 6 (six)  hours as needed for muscle spasms. 01/06/22  Yes Meredith Pel, MD  methotrexate 2.5 MG tablet Take 15 mg by mouth every Monday. Take 5 tablets  Caution: Chemotherapy. Protect from light.   Yes [provider]  Multiple Vitamins-Minerals (CENTRUM SILVER ADULT 50+) TABS Take 1 tablet by mouth daily.   Yes [provider]  oxyCODONE (OXY IR/ROXICODONE) 5 MG immediate release tablet Take 1 tablet (5 mg total) by mouth daily as needed for severe pain. 03/20/22  Yes Magnant, Charles L, PA-C   potassium chloride SA (KLOR-CON M) 20 MEQ tablet Take 1 tablet (20 mEq total) by mouth daily. 01/27/22  Yes Ghimire, Henreitta Leber, MD  tiotropium (SPIRIVA HANDIHALER) 18 MCG inhalation capsule Place 1 capsule (18 mcg total) into inhaler and inhale daily. 01/27/22 01/27/23 Yes Ghimire, Henreitta Leber, MD  albuterol (PROVENTIL) (2.5 MG/3ML) 0.083% nebulizer solution Take 3 mLs (2.5 mg total) by nebulization every 6 (six) hours as needed for wheezing or shortness of breath. 02/28/22   Collene Gobble, MD     Critical care time: n/a       Kennieth Rad, MSN, AG-ACNP-BC [facility] 06/12/2022, 4:53 PM  See Amion for pager If no response to pager, please call PCCM consult pager After 7:00 pm call Elink

## 2022-06-12 NOTE — Progress Notes (Signed)
Rounding Note    Patient Name: Jeffrey Stephens Date of Encounter: 06/12/2022  [place] Cardiologist: Sanda Klein, MD   Subjective   Ver dyspneic with minimal activity. OK at rest. Monitor shows persistent atrial flutter, usually 3:1 AV block and HR 90, but also frequent periods of sustained RVR with VR 130s  Inpatient Medications    Scheduled Meds:  apixaban  5 mg Oral BID   diltiazem  180 mg Oral Daily   docusate sodium  100 mg Oral Daily   dofetilide  250 mcg Oral BID   folic acid  1 mg Oral q morning   mometasone-formoterol  2 puff Inhalation BID   multivitamin with minerals  1 tablet Oral Daily   pantoprazole  20 mg Oral Daily   potassium chloride  30 mEq Oral Once   sodium chloride flush  3 mL Intravenous Q12H   sodium chloride flush  3 mL Intravenous Q12H   sodium chloride flush  3 mL Intravenous Q12H   umeclidinium bromide  1 puff Inhalation Daily   Continuous Infusions:  sodium chloride     sodium chloride     PRN Meds: sodium chloride, sodium chloride, acetaminophen **OR** acetaminophen, acetaminophen, albuterol, ipratropium-albuterol, ondansetron **OR** ondansetron (ZOFRAN) IV, sodium chloride flush, sodium chloride flush   Vital Signs    Vitals:   06/12/22 0400 06/12/22 0500 06/12/22 1046 06/12/22 1252  BP: 112/68  103/65   Pulse: (!) 110  92   Resp: 20  16   Temp: 98.2 F (36.8 C)  97.7 F (36.5 C)   TempSrc: Axillary  Oral   SpO2: 93%  93% 91%  Weight:  113 kg    Height:        Intake/Output Summary (Last 24 hours) at 06/12/2022 1336 Last data filed at 06/12/2022 1200 Gross per 24 hour  Intake 300 ml  Output 360 ml  Net -60 ml      06/12/2022    5:00 AM 06/10/2022    6:37 PM 03/16/2022    9:00 AM  Last 3 Weights  Weight (lbs) 249 lb 1.9 oz 230 lb 220 lb  Weight (kg) 113 kg 104.327 kg 99.791 kg      Telemetry    AFlutter with variable AV block - Personally Reviewed  ECG    AFlutter - Personally Reviewed Baseline  QT interval is inaccurate on ECG 09/16 due to flutter waves and artifact. O On today's monitor strip  by my measurement QTc 430 ms.  On last ECG in [facility] on 01/20/2022, QTc was 416 ms Physical Exam  Obese GEN: No acute distress.   Neck: 10-11 cm JVD Cardiac: RRR, no murmurs, rubs, or gallops.  Respiratory: Clear to auscultation bilaterally. GI: Soft, nontender, non-distended  MS: 3+ soft pitting edema almost to knees; No deformity. Neuro:  Nonfocal  Psych: Normal affect   Labs    High Sensitivity Troponin:   Recent Labs  Lab 06/10/22 1836 06/10/22 2037 06/11/22 0116 06/11/22 0429  TROPONINIHS 14 16 16 15      Chemistry Recent Labs  Lab 06/10/22 1836 06/10/22 1855 06/10/22 1858 06/11/22 0321 06/11/22 1055 06/12/22 0244  NA 140   < > 138 137 137 139  K 3.8   < > 3.7 3.7 4.1 3.9  CL 99  --  99  --  99 101  CO2 28  --   --   --  30 31  GLUCOSE 109*  --  106*  --  109* 108*  BUN 13  --  13  --  12 14  CREATININE 1.53*  --  1.50*  --  1.49* 1.71*  CALCIUM 8.9  --   --   --  8.6* 8.8*  PROT 6.3*  --   --   --   --  5.4*  ALBUMIN 2.9*  --   --   --   --  2.5*  AST 20  --   --   --   --  21  ALT 12  --   --   --   --  9  ALKPHOS 38  --   --   --   --  35*  BILITOT 1.5*  --   --   --   --  0.9  GFRNONAA 46*  --   --   --  47* 40*  ANIONGAP 13  --   --   --  8 7   < > = values in this interval not displayed.    Lipids No results for input(s): "CHOL", "TRIG", "HDL", "LABVLDL", "LDLCALC", "CHOLHDL" in the last 168 hours.  Hematology Recent Labs  Lab 06/10/22 1836 06/10/22 1855 06/10/22 1858 06/11/22 0321 06/12/22 0244  WBC 8.9  --   --   --  9.2  RBC 3.85*  --   --   --  3.45*  HGB 10.2*   < > 12.2* 10.9* 9.1*  HCT 34.7*   < > 36.0* 32.0* 31.3*  MCV 90.1  --   --   --  90.7  MCH 26.5  --   --   --  26.4  MCHC 29.4*  --   --   --  29.1*  RDW 24.2*  --   --   --  24.4*  PLT 331  --   --   --  278   < > = values in this interval not displayed.   Thyroid  Recent  Labs  Lab 06/11/22 0116  TSH 2.479    BNP Recent Labs  Lab 06/10/22 1837  BNP 224.2*    DDimer No results for input(s): "DDIMER" in the last 168 hours.   Radiology    EP PPM/ICD IMPLANT  Result Date: 06/12/2022 Successful loop recorder implantation.   ECHOCARDIOGRAM COMPLETE  Result Date: 06/12/2022    ECHOCARDIOGRAM REPORT   Patient Name:   KAYE MITRO Date of Exam: 06/12/2022 Medical Rec #:  [id number]       Height:       69.0 in Accession #:    [id number]      Weight:       249.1 lb Date of Birth:  30-May-1941      BSA:          2.268 m Patient Age:    41 years        BP:           112/68 mmHg Patient Gender: M               HR:           131 bpm. Exam Location:  Inpatient Procedure: 2D Echo Indications:    syncope  History:        Patient has prior history of Echocardiogram examinations, most                 recent 01/20/2022. COPD, Arrythmias:Atrial Flutter and Atrial  Fibrillation; Signs/Symptoms:Edema.  Sonographer:    Harvie Junior Referring Phys: [id number] SARA-MAIZ A THOMAS  Sonographer Comments: Technically difficult study due to poor echo windows. Patient in chair and Image acquisition challenging due to respiratory motion. IMPRESSIONS  1. Left ventricular ejection fraction, by estimation, is 60 to 65%. The left ventricle has normal function. The left ventricle has no regional wall motion abnormalities. Left ventricular diastolic function could not be evaluated.  2. Right ventricular systolic function is moderately reduced. The right ventricular size is mildly enlarged.  3. The mitral valve is normal in structure. Trivial mitral valve regurgitation. No evidence of mitral stenosis.  4. The aortic valve is tricuspid. Aortic valve regurgitation is not visualized. Aortic valve sclerosis is present, with no evidence of aortic valve stenosis. Aortic valve area, by VTI measures 2.79 cm. Aortic valve mean gradient measures 3.0 mmHg. Aortic valve Vmax measures 1.09 m/s.  5.  There is borderline dilatation of the aortic root, measuring 37 mm. There is mild dilatation of the ascending aorta, measuring 39 mm. FINDINGS  Left Ventricle: Left ventricular ejection fraction, by estimation, is 60 to 65%. The left ventricle has normal function. The left ventricle has no regional wall motion abnormalities. The left ventricular internal cavity size was normal in size. There is  no left ventricular hypertrophy. Left ventricular diastolic function could not be evaluated due to atrial fibrillation. Left ventricular diastolic function could not be evaluated. Normal left ventricular filling pressure. Right Ventricle: The right ventricular size is mildly enlarged. No increase in right ventricular wall thickness. Right ventricular systolic function is moderately reduced. Left Atrium: Left atrial size was normal in size. Right Atrium: Right atrial size was normal in size. Pericardium: Trivial pericardial effusion is present. The pericardial effusion is anterior to the right ventricle and surrounding the apex. Mitral Valve: The mitral valve is normal in structure. Trivial mitral valve regurgitation. No evidence of mitral valve stenosis. Tricuspid Valve: The tricuspid valve is normal in structure. Tricuspid valve regurgitation is trivial. No evidence of tricuspid stenosis. Aortic Valve: The aortic valve is tricuspid. Aortic valve regurgitation is not visualized. Aortic valve sclerosis is present, with no evidence of aortic valve stenosis. Aortic valve mean gradient measures 3.0 mmHg. Aortic valve peak gradient measures 4.8  mmHg. Aortic valve area, by VTI measures 2.79 cm. Pulmonic Valve: The pulmonic valve was normal in structure. Pulmonic valve regurgitation is not visualized. No evidence of pulmonic stenosis. Aorta: The aortic root is normal in size and structure. There is borderline dilatation of the aortic root, measuring 37 mm. There is mild dilatation of the ascending aorta, measuring 39 mm. Venous:  The inferior vena cava was not well visualized. IAS/Shunts: No atrial level shunt detected by color flow Doppler.  LEFT VENTRICLE PLAX 2D LVIDd:         3.50 cm     Diastology LVIDs:         2.30 cm     LV e' medial:    8.51 cm/s LV PW:         1.10 cm     LV E/e' medial:  13.7 LV IVS:        1.10 cm     LV e' lateral:   14.40 cm/s LVOT diam:     2.20 cm     LV E/e' lateral: 8.1 LV SV:         63 LV SV Index:   28 LVOT Area:     3.80 cm  LV Volumes (MOD) LV  vol d, MOD A2C: 63.7 ml LV vol d, MOD A4C: 58.8 ml LV vol s, MOD A2C: 28.6 ml LV vol s, MOD A4C: 24.4 ml LV SV MOD A2C:     35.1 ml LV SV MOD A4C:     58.8 ml LV SV MOD BP:      36.9 ml RIGHT VENTRICLE RV Basal diam:  4.30 cm RV Mid diam:    3.90 cm RV S prime:     16.60 cm/s TAPSE (M-mode): 1.2 cm LEFT ATRIUM           Index        RIGHT ATRIUM           Index LA diam:      3.60 cm 1.59 cm/m   RA Area:     23.60 cm LA Vol (A4C): 67.5 ml 29.76 ml/m  RA Volume:   72.00 ml  31.74 ml/m  AORTIC VALVE                    PULMONIC VALVE AV Area (Vmax):    3.56 cm     PV Vmax:       1.06 m/s AV Area (Vmean):   3.37 cm     PV Peak grad:  4.5 mmHg AV Area (VTI):     2.79 cm AV Vmax:           109.00 cm/s AV Vmean:          72.800 cm/s AV VTI:            0.225 m AV Peak Grad:      4.8 mmHg AV Mean Grad:      3.0 mmHg LVOT Vmax:         102.00 cm/s LVOT Vmean:        64.600 cm/s LVOT VTI:          0.165 m LVOT/AV VTI ratio: 0.73  AORTA Ao Root diam: 3.70 cm Ao Asc diam:  3.90 cm MITRAL VALVE                TRICUSPID VALVE MV Area (PHT): 6.07 cm     TR Peak grad:   17.1 mmHg MV Decel Time: 125 msec     TR Vmax:        207.00 cm/s MR Peak grad: 61.8 mmHg MR Vmax:      393.00 cm/s   SHUNTS MV E velocity: 117.00 cm/s  Systemic VTI:  0.16 m MV A velocity: 69.20 cm/s   Systemic Diam: 2.20 cm MV E/A ratio:  1.69 Fransico Him MD Electronically signed by Fransico Him MD Signature Date/Time: 06/12/2022/10:38:34 AM    Final    VAS US CAROTID  Result Date: 06/11/2022 Carotid  Arterial Duplex Study Patient Name:  LEANARD DIMAIO  Date of Exam:   06/11/2022 Medical Rec #: [id number]        Accession #:    [id number] Date of Birth: 1941/09/05       Patient Gender: M Patient Age:   18 years Exam Location:  [facility] Procedure:      VAS US CAROTID Referring Phys: Myles Rosenthal --------------------------------------------------------------------------------  Indications:       Syncope. Risk Factors:      Past history of smoking, prior CVA. Other Factors:     AAA, AFIB, TIA, CKD. Limitations        Today's exam was limited due to heavy calcification and the  resulting shadowing, body habitus and the body habitus of the                    patient. Comparison Study:  No previous exams Performing Technologist: Jody Hill RVT, RDMS  Examination Guidelines: A complete evaluation includes B-mode imaging, spectral Doppler, color Doppler, and power Doppler as needed of all accessible portions of each vessel. Bilateral testing is considered an integral part of a complete examination. Limited examinations for reoccurring indications may be performed as noted.  Right Carotid Findings: +----------+--------+--------+--------+------------------+------------------+           PSV cm/sEDV cm/sStenosisPlaque DescriptionComments           +----------+--------+--------+--------+------------------+------------------+ CCA Prox  85      9                                                    +----------+--------+--------+--------+------------------+------------------+ CCA Distal73      9                                 intimal thickening +----------+--------+--------+--------+------------------+------------------+ ICA Prox  142     22      40-59%  calcific          Shadowing          +----------+--------+--------+--------+------------------+------------------+ ICA Mid   78      17                                                    +----------+--------+--------+--------+------------------+------------------+ ICA Distal48      12                                                   +----------+--------+--------+--------+------------------+------------------+ ECA       170                                                          +----------+--------+--------+--------+------------------+------------------+ +----------+--------+-------+----------------+-------------------+           PSV cm/sEDV cmsDescribe        Arm Pressure (mmHG) +----------+--------+-------+----------------+-------------------+ [id number]             Multiphasic, WNL                    +----------+--------+-------+----------------+-------------------+ +---------+--------+--+--------+-+---------+ VertebralPSV cm/s32EDV cm/s7Antegrade +---------+--------+--+--------+-+---------+  Left Carotid Findings: +----------+--------+--------+--------+------------------+------------------+           PSV cm/sEDV cm/sStenosisPlaque DescriptionComments           +----------+--------+--------+--------+------------------+------------------+ CCA Prox  110     10                                                   +----------+--------+--------+--------+------------------+------------------+  CCA Distal96      16                                                   +----------+--------+--------+--------+------------------+------------------+ ICA Prox  66      16                                intimal thickening +----------+--------+--------+--------+------------------+------------------+ ICA Distal71      14                                                   +----------+--------+--------+--------+------------------+------------------+ ECA       67                                                           +----------+--------+--------+--------+------------------+------------------+  +----------+--------+--------+----------------+-------------------+           PSV cm/sEDV cm/sDescribe        Arm Pressure (mmHG) +----------+--------+--------+----------------+-------------------+ [id number]              Multiphasic, WNL                    +----------+--------+--------+----------------+-------------------+ +---------+--------+--+--------+--+---------+ VertebralPSV cm/s43EDV cm/s10Antegrade +---------+--------+--+--------+--+---------+   Summary: Right Carotid: Velocities in the right ICA are consistent with a 40-59%                stenosis. The CCA & ECA were near-normal with only minimal wall                thickening or plaque. Left Carotid: The extracranial vessels were near-normal with only minimal wall               thickening or plaque. Vertebrals:  Bilateral vertebral arteries demonstrate antegrade flow. Subclavians: Normal flow hemodynamics were seen in bilateral subclavian              arteries. *See table(s) above for measurements and observations.     Preliminary    EEG adult  Result Date: 06/11/2022 Greta Doom, MD     06/11/2022 11:16 AM History: 81 year old male being evaluated for syncope Sedation: None Technique: This EEG was acquired with electrodes placed according to the International 10-20 electrode system (including Fp1, Fp2, F3, F4, C3, C4, P3, P4, O1, O2, T3, T4, T5, T6, A1, A2, Fz, Cz, Pz). The following electrodes were missing or displaced: none. Background: There is some slowing of the background with the posterior dominant rhythm having a frequency of 7 to 8 Hz.  In addition, there is mild intrusion of generalized irregular delta and theta range activities into the waking background as well.  Sleep is not recorded.  No epileptiform discharges were seen. Photic stimulation: Physiologic driving is now performed EEG Abnormalities: 1) generalized irregular slow activity 2) slow posterior dominant rhythm Clinical Interpretation: This EEG is  consistent with a mild generalized nonspecific cerebral dysfunction (encephalopathy). There was no seizure or seizure predisposition recorded on this study. Please note that lack  of epileptiform activity on EEG does not preclude the possibility of epilepsy. Roland Rack, MD [facility] [phone] If 7pm- 7am, please page neurology on call as listed in [place].   CT Angio Chest PE W and/or Wo Contrast  Result Date: 06/10/2022 CLINICAL DATA:  Positive D-dimer. EXAM: CT ANGIOGRAPHY CHEST WITH CONTRAST TECHNIQUE: Multidetector CT imaging of the chest was performed using the standard protocol during bolus administration of intravenous contrast. Multiplanar CT image reconstructions and MIPs were obtained to evaluate the vascular anatomy. RADIATION DOSE REDUCTION: This exam was performed according to the departmental dose-optimization program which includes automated exposure control, adjustment of the mA and/or kV according to patient size and/or use of iterative reconstruction technique. CONTRAST:  38mL OMNIPAQUE IOHEXOL 350 MG/ML SOLN COMPARISON:  Chest CT 02/23/2022 FINDINGS: Cardiovascular: Satisfactory opacification of the pulmonary arteries to the segmental level. No evidence of pulmonary embolism. Normal heart size. No pericardial effusion. There are atherosclerotic calcifications of the aorta and coronary arteries. Mediastinum/Nodes: There surgical clips in the right hilar region. There are nonenlarged and enlarged right hilar lymph nodes measuring up to 16 mm. This appears unchanged. No new enlarged lymph nodes are seen. Visualized thyroid gland and esophagus are within normal limits. Lungs/Pleura: Severe emphysematous changes are again seen. There are small bilateral pleural effusions which are similar to the prior examination. There is some ground-glass opacities in the bilateral lower lungs, also unchanged. Scarring along the right major fissure is unchanged from prior. There is no new  focal lung infiltrate or pneumothorax. Upper Abdomen: 4 cm left renal cyst is partially visualized. Rounded hyperdensity in the left kidney is too small to characterize and unchanged, possibly a complex cysts. Small right renal cyst is not well seen secondary to artifact. Musculoskeletal: No chest wall abnormality. No acute or significant osseous findings. Review of the MIP images confirms the above findings. IMPRESSION: 1. No evidence for pulmonary embolism. 2. Stable small pleural effusions. 3. Stable ground-glass opacities in the bilateral lower lungs which may represent infection/inflammation or edema. 4. Stable severe emphysema. 5. Multiple lesions, including 4 cm left Bosniak 1 benign simple cyst. No follow-up imaging is recommended. JACR 2018 Feb; 264-273, Management of the Incidental RenalMass on CT, [employer] 2021; 814-848, Bosniak Classification of Cystic Renal Masses, Version 2019. Aortic Atherosclerosis (ICD10-I70.0) and Emphysema (ICD10-J43.9). Electronically Signed   By: Ronney Asters M.D.   On: 06/10/2022 23:04   CT Head Wo Contrast  Result Date: 06/10/2022 CLINICAL DATA:  Altered mental status EXAM: CT HEAD WITHOUT CONTRAST TECHNIQUE: Contiguous axial images were obtained from the base of the skull through the vertex without intravenous contrast. RADIATION DOSE REDUCTION: This exam was performed according to the departmental dose-optimization program which includes automated exposure control, adjustment of the mA and/or kV according to patient size and/or use of iterative reconstruction technique. COMPARISON:  None Available. FINDINGS: Brain: No evidence of acute infarction, hemorrhage, hydrocephalus, extra-axial collection or mass lesion/mass effect. Chronic atrophic changes are noted as well as chronic white matter ischemic change. Findings of prior aneurysm clipping noted in the posterior fossa on the left. Vascular: No hyperdense vessel or unexpected calcification. Skull: Postsurgical  changes are noted in the occipital bone on the left consistent with prior aneurysm clipping. Sinuses/Orbits: No acute finding. Other: None. IMPRESSION: Chronic atrophic and ischemic changes without acute abnormality. Electronically Signed   By: Inez Catalina M.D.   On: 06/10/2022 22:58   DG Chest Portable 1 View  Result Date: 06/10/2022 CLINICAL DATA:  Shortness of breath EXAM: PORTABLE  CHEST 1 VIEW COMPARISON:  06/08/2022 CT FINDINGS: Cardiac shadow is enlarged but stable. Aortic calcifications are noted. Diffuse emphysematous changes are noted with fibrosis similar to that seen on prior CT examination. The prior surgical changes are again seen in the right infrahilar region. Area of increased density in the right upper lobe seen on prior CT examination has resolved in the interval. No sizable effusion is noted. No bony abnormality is seen. IMPRESSION: Emphysematous changes with underlying fibrotic change. No acute abnormality noted. Electronically Signed   By: Inez Catalina M.D.   On: 06/10/2022 19:34    Cardiac Studies   ECHO 06/12/2022    1. Left ventricular ejection fraction, by estimation, is 60 to 65%. The  left ventricle has normal function. The left ventricle has no regional  wall motion abnormalities. Left ventricular diastolic function could not  be evaluated.   2. Right ventricular systolic function is moderately reduced. The right  ventricular size is mildly enlarged.   3. The mitral valve is normal in structure. Trivial mitral valve  regurgitation. No evidence of mitral stenosis.   4. The aortic valve is tricuspid. Aortic valve regurgitation is not  visualized. Aortic valve sclerosis is present, with no evidence of aortic  valve stenosis. Aortic valve area, by VTI measures 2.79 cm. Aortic valve  mean gradient measures 3.0 mmHg.  Aortic valve Vmax measures 1.09 m/s.   5. There is borderline dilatation of the aortic root, measuring 37 mm.  There is mild dilatation of the ascending  aorta, measuring 39 mm.   Patient Profile     81 y.o. male with a hx of atrial flutter (bradycardia on sotalol >> DCd), remote DVT, abdominal aortic aneurysm (followed by Dr. Scot Dock), Lung CA s/p RLL wedge resection in 2017, Chronic Obstructive Pulmonary Disease on home O2, borderline dilation of the ascending aorta, coronary artery Ca2+ on CT, aortic atherosclerosis, cerebral aneurysm c/b secondary hemorrhagic stroke s/p clipping in 1994, orthostatic hypotension, rheumatoid arthritis who was admitted for unheralded syncope 06/10/2022  Assessment & Plan    Syncope: suspect hypoxia related, but cannot exclude arrhythmia. He agrees to proceed with loop recorder implantation today. This procedure has been fully reviewed with the patient and written informed consent has been obtained. AFlutter: persistent, frequently with RVR. Discussed management w Dr. Curt Bears. Not great ablation candidate. Had severe sinus bradycardia w sotalol. Will initiate dofetilide, adjusted for GFR approx 45. He understands there is a mandatory 72 h in-hospital monitoring period. Baseline QT interval is inaccurate on ECG 09/16 due to flutter waves and artifact. On monitor strip  by my measurement QTc 430 ms. On last ECG in [facility] on 01/20/2022, QTc was 416 ms. Needs better rate control. Increase diltiazem dose to 240 mg daily.  COPD/s/p lobectomy: was well compensated until severe respiratory illness about 5 months ago. On O2 ever since. Had an appt scheduled this week with Dr. Lamonte Sakai to follow up on CT performed 09/14. CT does raise possibility there is a component of CHF. Will ask inpatient Pulmonary specialist to review. CHF: clinically and by echo, he has clear evidence of right heart dysfunction c/w cor pulmonale. There is less proof for left heart failure. Echo LV diastolic function analysis is limited by the atrial flutter. BNP is only very mildly abnormal at 224, but higher than earlier in the year at 143. Start low dose loop  diuretics, K and Mg supplement, being careful to keep electrolytes in safe range for dofetilide. Ac on chronic resp failure w hypoxia: most  likely cause of syncope in my opinion.     For questions or updates, please contact [place] Please consult [email or web address] for contact info under        Signed, Sanda Klein, MD  06/12/2022, 1:36 PM

## 2022-06-12 NOTE — TOC Initial Note (Signed)
Transition of Care [facility]) - Initial/Assessment Note    Patient Details  Name: Jeffrey Stephens MRN: [id number] Date of Birth: July 20, 1941  Transition of Care [facility]) CM/SW Contact:    Pollie Friar, RN Phone Number: 06/12/2022, 1:46 PM  Clinical Narrative:                 Patient is from home with his spouse. His daughter has been assisting at home but is going to be going to [place] soon. Wife is not able to provide the assistance at home he will need. Family is interested in SNF rehab prior to home.  Awaiting OT eval.  Family agreeable to him being faxed out for SNF rehab.  TOC following.   Expected Discharge Plan: Skilled Nursing Facility Barriers to Discharge: Continued Medical Work up   Patient Goals and CMS Choice   CMS Medicare.gov Compare Post Acute Care list provided to:: Patient Represenative (must comment) Choice offered to / list presented to : Spouse, Adult Children  Expected Discharge Plan and Services Expected Discharge Plan: [place]   Discharge Planning Services: CM Consult Post Acute Care Choice: [place] Living arrangements for the past 2 months: Single Family Home                                      Prior Living Arrangements/Services Living arrangements for the past 2 months: Single Family Home Lives with:: Spouse, Adult Children Patient language and need for interpreter reviewed:: Yes Do you feel safe going back to the place where you live?: Yes            Criminal Activity/Legal Involvement Pertinent to Current Situation/Hospitalization: No - Comment as needed  Activities of Daily Living Home Assistive Devices/Equipment: Environmental consultant (specify type), Wheelchair ADL Screening (condition at time of admission) Patient's cognitive ability adequate to safely complete daily activities?: Yes Is the patient deaf or have difficulty hearing?: No Does the patient have difficulty seeing, even when wearing glasses/contacts?:  No Does the patient have difficulty concentrating, remembering, or making decisions?: No Patient able to express need for assistance with ADLs?: Yes Does the patient have difficulty dressing or bathing?: Yes Independently performs ADLs?: No Communication: Needs assistance Is this a change from baseline?: Pre-admission baseline Dressing (OT): Needs assistance Is this a change from baseline?: Pre-admission baseline Grooming: Needs assistance Is this a change from baseline?: Pre-admission baseline Feeding: Independent Bathing: Needs assistance Is this a change from baseline?: Pre-admission baseline In/Out Bed: Needs assistance Is this a change from baseline?: Pre-admission baseline Walks in Home: Needs assistance Is this a change from baseline?: Pre-admission baseline Does the patient have difficulty walking or climbing stairs?: Yes Weakness of Legs: Both Weakness of Arms/Hands: None  Permission Sought/Granted                  Emotional Assessment Appearance:: Appears stated age     Orientation: : Oriented to Self, Oriented to Place, Oriented to  Time, Oriented to Situation   Psych Involvement: No (comment)  Admission diagnosis:  Syncope and collapse [R55] Syncope [R55] Patient Active Problem List   Diagnosis Date Noted   Syncope and collapse    Syncope 06/11/2022   Arthritis of right knee    Acute metabolic encephalopathy 41/74/0814   CAP (community acquired pneumonia) 01/21/2022   Atrial fibrillation with rapid ventricular response ([place]) 01/21/2022   COPD exacerbation ([place]) 01/21/2022   CKD  stage G3a/A1, GFR 45-59 and albumin creatinine ratio <30 mg/g (HCC) 01/21/2022   Hyponatremia 01/21/2022   Pneumonia due to virus 01/21/2022   Acute pulmonary edema ([place]) 01/21/2022   Acute respiratory failure ([place]) 01/20/2022   Hip fracture ([place]) 01/02/2022   Leukocytosis 01/02/2022   Normocytic anemia 01/02/2022   History of DVT (deep vein thrombosis) 01/02/2022   Acute  on chronic respiratory failure with hypoxemia due to rhinovirus pneumonia ([place]) 03/22/2021   COPD with emphysema ([place]) 03/03/2020   Aortic atherosclerosis ([place]) 08/30/2018   Long term current use of anticoagulant 01/18/2017   Lung cancer, lower lobe ([place]) 05/08/2016   Anticoagulation adequate 08/14/2014   Paroxysmal atrial flutter ([place]) 08/12/2014   Atrial flutter ([place]) 08/12/2014   Abnormal nuclear cardiac imaging test 07/13/2014   Coronary artery calcification seen on CT scan 06/17/2014   Atrial flutter with rapid ventricular response, converted with sotalol 06/11/2014   Left knee DJD 05/14/2014   Abdominal aortic aneurysm ([place]) 07/31/2008   Pulmonary nodules 07/31/2008   Rheumatoid arthritis ([place]) 07/31/2008   SKIN CANCER, HX OF 07/31/2008   NEPHROLITHIASIS, HX OF 07/31/2008   BENIGN PROSTATIC HYPERTROPHY, HX OF 07/31/2008   PCP:  Lavone Orn, MD Pharmacy:   [id number] - [place], [place] - [address] [place] [address] Phone: [phone] Fax: [phone]     Social Determinants of Health (SDOH) Interventions    Readmission Risk Interventions     No data to display

## 2022-06-12 NOTE — Progress Notes (Signed)
Physical Therapy Treatment Patient Details Name: Jeffrey Stephens MRN: [id number] DOB: 20-Mar-1941 Today's Date: 06/12/2022   History of Present Illness 81 year old male with medical history of atrial flutter on Eliquis, VTE, lung cancer s/p lobectomy in 2017, rheumatoid arthritis on methotrexate, COPD on home O2 4 L/min presented to ED with complaints of syncope with prolonged unresponsiveness.  On EMS arrival patient was noted to be hypoxemic with O2 sats in 70s on oxygen on 4 L/min via nasal cannula.  Patient had 3 episodes of syncope in past 3 weeks.    PT Comments    Pt sitting EOB on arrival on 5L O2. He required mod assist sit to stand and min assist SPT with RW. Mobility limited by DOE/desat and +orthostatics. Pt with c/o dizziness with standing/mobility. SpO2 95% at rest and desat to 80% with minimal activity. Max HR 133. Pt in recliner with feet elevated at end of session.                                                  BP Sitting prior to mobility      108/71 Standing                              102/61 In recliner after SPT             85/65 After 3 min in recliner         123/78       Recommendations for follow up therapy are one component of a multi-disciplinary discharge planning process, led by the attending physician.  Recommendations may be updated based on patient status, additional functional criteria and insurance authorization.  Follow Up Recommendations  Home health PT (will need to consider STR, if pt does not progress)     Assistance Recommended at Discharge Intermittent Supervision/Assistance  Patient can return home with the following A lot of help with walking and/or transfers;Assistance with cooking/housework;Assist for transportation;Help with stairs or ramp for entrance;A lot of help with bathing/dressing/bathroom   Equipment Recommendations  Rollator (4 wheels) (bariatric)    Recommendations for Other Services       Precautions / Restrictions  Precautions Precautions: Fall;Other (comment) Precaution Comments: monitor orthostatics, HR, and O2 sats     Mobility  Bed Mobility               General bed mobility comments: Pt sitting EOB on arrival    Transfers Overall transfer level: Needs assistance Equipment used: Rolling walker (2 wheels) Transfers: Sit to/from Stand, Bed to chair/wheelchair/BSC Sit to Stand: Mod assist   Step pivot transfers: Min assist       General transfer comment: sit to stand x 2 trials from EOB, pivot to recliner after 2nd stand, cues for hand placement and sequencing    Ambulation/Gait               General Gait Details: deferred due to +orthostatics, DOE and desat with minimal activity   Stairs             Wheelchair Mobility    Modified Rankin (Stroke Patients Only)       Balance Overall balance assessment: Needs assistance Sitting-balance support: No upper extremity supported, Feet supported Sitting balance-Leahy Scale: Fair     Standing balance support: Bilateral upper  extremity supported, During functional activity, Reliant on assistive device for balance Standing balance-Leahy Scale: Poor                              Cognition Arousal/Alertness: Awake/alert Behavior During Therapy: WFL for tasks assessed/performed Overall Cognitive Status: Within Functional Limits for tasks assessed                                          Exercises      General Comments General comments (skin integrity, edema, etc.): SpO2 on 5L at rest 95% and desat to 80% during minimal activity. Resting HR 103. Max HR 133.      Pertinent Vitals/Pain Pain Assessment Pain Assessment: No/denies pain    Home Living                          Prior Function            PT Goals (current goals can now be found in the care plan section) Acute Rehab PT Goals Patient Stated Goal: breathe better when he moves Progress towards PT goals:  Progressing toward goals    Frequency    Min 3X/week      PT Plan Current plan remains appropriate    Co-evaluation              AM-PAC PT "6 Clicks" Mobility   Outcome Measure  Help needed turning from your back to your side while in a flat bed without using bedrails?: A Little Help needed moving from lying on your back to sitting on the side of a flat bed without using bedrails?: A Lot Help needed moving to and from a bed to a chair (including a wheelchair)?: A Little Help needed standing up from a chair using your arms (e.g., wheelchair or bedside chair)?: A Lot Help needed to walk in hospital room?: Total Help needed climbing 3-5 steps with a railing? : Total 6 Click Score: 12    End of Session Equipment Utilized During Treatment: Gait belt;Oxygen Activity Tolerance: Treatment limited secondary to medical complications (Comment) (+orhtostatics, DOE) Patient left: in chair;with call bell/phone within reach;with chair alarm set;with family/visitor present Nurse Communication: Mobility status PT Visit Diagnosis: Other abnormalities of gait and mobility (R26.89);Repeated falls (R29.6);Muscle weakness (generalized) (M62.81);Dizziness and giddiness (R42)     Time: 2836-6294 PT Time Calculation (min) (ACUTE ONLY): 30 min  Charges:  $Therapeutic Activity: 23-37 mins                     Lorrin Goodell, PT  Office # [phone] Pager [phone]    Lorriane Shire 06/12/2022, 9:04 AM

## 2022-06-12 NOTE — Plan of Care (Signed)

## 2022-06-12 NOTE — Op Note (Signed)
LOOP RECORDER IMPLANT   Procedure report  Procedure performed:  Loop recorder implantation   Reason for procedure:  1. Recurrent syncope/near-syncope 2. Persistent atrial flutter Procedure performed by:  Sanda Klein, MD  Complications:  None  Estimated blood loss:  <5 mL  Medications administered during procedure:  Lidocaine 1% with 1/10,000 epinephrine 10 mL locally Device details:  Medtronic Reveal Linq model number [id number], serial number [id number] Procedure details:  After the risks and benefits of the procedure were discussed the patient provided informed consent. The patient was prepped and draped in usual sterile fashion. Local anesthesia was administered to an area 2 cm to the left of the sternum in the 4th intercostal space. A cutaneous incision was made using the incision tool. The introducer was then used to create a subcutaneous tunnel and carefully deploy the device. Local pressure was held to ensure hemostasis.  The incision was closed with SteriStrips and a sterile dressing was applied.   Sanda Klein, MD, [facility] [phone] office [phone] pager 06/12/2022 1:22 PM

## 2022-06-12 NOTE — TOC Benefit Eligibility Note (Signed)
Patient Research scientist (life sciences) completed.     The patient is currently admitted and upon discharge could be taking Tikosyn.   The current 30 day co-pay is, $10.   The patient is insured through [employer].

## 2022-06-12 NOTE — NC FL2 (Signed)
[place] LEVEL OF CARE SCREENING TOOL     IDENTIFICATION  Patient Name: Jeffrey Stephens Birthdate: [birth year] Sex: male Admission Date (Current Location): 06/10/2022  [facility] and [place] Number:  Herbalist and Address:  [facility] [place], [place] [address], [place], [place] [address]      Provider Number: [id number]  Attending Physician Name and Address:  Oswald Hillock, MD  Relative Name and Phone Number:       Current Level of Care: Hospital Recommended Level of Care: [place] Prior Approval Number:    Date Approved/Denied:   PASRR Number: [id number]  Discharge Plan: SNF    Current Diagnoses: Patient Active Problem List   Diagnosis Date Noted   Syncope and collapse    Syncope 06/11/2022   Arthritis of right knee    Acute metabolic encephalopathy 71/69/6789   CAP (community acquired pneumonia) 01/21/2022   Atrial fibrillation with rapid ventricular response ([place]) 01/21/2022   COPD exacerbation ([place]) 01/21/2022   CKD stage G3a/A1, GFR 45-59 and albumin creatinine ratio <30 mg/g ([place]) 01/21/2022   Hyponatremia 01/21/2022   Pneumonia due to virus 01/21/2022   Acute pulmonary edema ([place]) 01/21/2022   Acute respiratory failure ([place]) 01/20/2022   Hip fracture ([place]) 01/02/2022   Leukocytosis 01/02/2022   Normocytic anemia 01/02/2022   History of DVT (deep vein thrombosis) 01/02/2022   Acute on chronic respiratory failure with hypoxemia due to rhinovirus pneumonia ([place]) 03/22/2021   COPD with emphysema ([place]) 03/03/2020   Aortic atherosclerosis ([place]) 08/30/2018   Long term current use of anticoagulant 01/18/2017   Lung cancer, lower lobe ([place]) 05/08/2016   Anticoagulation adequate 08/14/2014   Paroxysmal atrial flutter ([place]) 08/12/2014   Atrial flutter ([place]) 08/12/2014   Abnormal nuclear cardiac imaging test 07/13/2014   Coronary artery calcification seen on CT scan 06/17/2014   Atrial flutter with rapid  ventricular response, converted with sotalol 06/11/2014   Left knee DJD 05/14/2014   Abdominal aortic aneurysm ([place]) 07/31/2008   Pulmonary nodules 07/31/2008   Rheumatoid arthritis ([place]) 07/31/2008   SKIN CANCER, HX OF 07/31/2008   NEPHROLITHIASIS, HX OF 07/31/2008   BENIGN PROSTATIC HYPERTROPHY, HX OF 07/31/2008    Orientation RESPIRATION BLADDER Height & Weight     Self, Time, Situation, Place  Normal, O2 (See d/c summary for oxygen requirements) Continent Weight: 113 kg Height:  5\' 9"  (175.3 cm)  BEHAVIORAL SYMPTOMS/MOOD NEUROLOGICAL BOWEL NUTRITION STATUS      Continent Diet (heart healthy with thin liquids)  AMBULATORY STATUS COMMUNICATION OF NEEDS Skin   Extensive Assist Verbally PU Stage and Appropriate Care (Foam dressing to buttocks/ loop recorder in left upper chest)   PU Stage 2 Dressing: Daily                   Personal Care Assistance Level of Assistance  Bathing, Feeding, Dressing Bathing Assistance: Maximum assistance Feeding assistance: Limited assistance Dressing Assistance: Maximum assistance     Functional Limitations Info  Sight, Hearing, Speech Sight Info: Impaired Hearing Info: Impaired Speech Info: Adequate    SPECIAL CARE FACTORS FREQUENCY  PT (By licensed PT), OT (By licensed OT)     PT Frequency: 5x/wk OT Frequency: 5x/wk            Contractures Contractures Info: Not present    Additional Factors Info  Code Status, Allergies Code Status Info: Full Allergies Info: NKA           Current Medications (06/12/2022):  This is the current hospital active medication list Current Facility-Administered Medications  Medication Dose Route Frequency Provider Last Rate Last Admin   0.9 %  sodium chloride infusion  250 mL Intravenous PRN Croitoru, Mihai, MD       0.9 %  sodium chloride infusion  250 mL Intravenous PRN Croitoru, Mihai, MD       acetaminophen (TYLENOL) tablet 650 mg  650 mg Oral Q6H PRN Clance Boll, MD       Or    acetaminophen (TYLENOL) suppository 650 mg  650 mg Rectal Q6H PRN Clance Boll, MD       acetaminophen (TYLENOL) tablet 1,000 mg  1,000 mg Oral Q6H PRN Myles Rosenthal A, MD   1,000 mg at 06/11/22 2135   albuterol (PROVENTIL) (2.5 MG/3ML) 0.083% nebulizer solution 2.5 mg  2.5 mg Nebulization Q6H PRN Clance Boll, MD       apixaban [place]) tablet 5 mg  5 mg Oral BID Myles Rosenthal A, MD   5 mg at 06/12/22 1022   [START ON 06/13/2022] diltiazem (CARDIZEM CD) 24 hr capsule 240 mg  240 mg Oral Daily Croitoru, Mihai, MD       docusate sodium (COLACE) capsule 100 mg  100 mg Oral Daily Myles Rosenthal A, MD   100 mg at 06/12/22 1022   dofetilide (TIKOSYN) capsule 250 mcg  250 mcg Oral BID Croitoru, Mihai, MD       folic acid (FOLVITE) tablet 1 mg  1 mg Oral q morning Myles Rosenthal A, MD   1 mg at 06/12/22 1022   furosemide (LASIX) injection 20 mg  20 mg Intravenous BID Croitoru, Mihai, MD       ipratropium-albuterol (DUONEB) 0.5-2.5 (3) MG/3ML nebulizer solution 3 mL  3 mL Nebulization Q6H PRN Clance Boll, MD       [START ON 06/13/2022] magnesium oxide (MAG-OX) tablet 400 mg  400 mg Oral Daily Croitoru, Mihai, MD       mometasone-formoterol (DULERA) 200-5 MCG/ACT inhaler 2 puff  2 puff Inhalation BID Clance Boll, MD   2 puff at 06/12/22 0749   multivitamin with minerals tablet 1 tablet  1 tablet Oral Daily Clance Boll, MD   1 tablet at 06/12/22 1022   pantoprazole (PROTONIX) EC tablet 20 mg  20 mg Oral Daily Myles Rosenthal A, MD   20 mg at 06/12/22 1022   [START ON 06/13/2022] potassium chloride SA (KLOR-CON M) CR tablet 20 mEq  20 mEq Oral Daily Croitoru, Mihai, MD       potassium chloride SA (KLOR-CON M) CR tablet 30 mEq  30 mEq Oral Once Croitoru, Mihai, MD       sodium chloride flush (NS) 0.9 % injection 3 mL  3 mL Intravenous Q12H Myles Rosenthal A, MD   3 mL at 06/12/22 1022   sodium chloride flush (NS) 0.9 % injection 3 mL  3 mL Intravenous Q12H  Croitoru, Mihai, MD       sodium chloride flush (NS) 0.9 % injection 3 mL  3 mL Intravenous PRN Croitoru, Mihai, MD       sodium chloride flush (NS) 0.9 % injection 3 mL  3 mL Intravenous Q12H Croitoru, Mihai, MD       sodium chloride flush (NS) 0.9 % injection 3 mL  3 mL Intravenous PRN Croitoru, Mihai, MD       umeclidinium bromide (INCRUSE ELLIPTA) 62.5 MCG/ACT 1 puff  1 puff Inhalation Daily Clance Boll, MD  1 puff at 06/12/22 0749     Discharge Medications: Please see discharge summary for a list of discharge medications.  Relevant Imaging Results:  Relevant Lab Results:   Additional Information SS#: [id number]  Pollie Friar, RN

## 2022-06-12 NOTE — Progress Notes (Signed)
[facility]  PROGRESS NOTE  Jeffrey Stephens XTK:[id number] DOB: 19-Sep-1941 DOA: 06/10/2022 PCP: Lavone Orn, MD   Brief HPI:   81 year old male with medical history of atrial flutter on Eliquis, VTE, lung cancer s/p lobectomy in 2017, rheumatoid arthritis on methotrexate, COPD on home O2 4 L/min presented to ED with complaints of syncope with prolonged unresponsiveness.  On EMS arrival patient was noted to be hypoxemic with O2 sats in 70s on oxygen on 4 L/min via nasal cannula.  Patient had 3 episodes of syncope in past 3 weeks.  There was no history of prodrome.  No bowel or bladder incontinence, no seizure-like activity. CT head was unremarkable, CTA chest showed no pulmonary embolism.   Subjective   Patient seen and examined, breathing has somewhat improved.  Diuresed well with Lasix.   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   -Cardiology was consulted, underwent implanted loop recorder placement today  Acute on chronic hypoxemic respiratory failure -Likely due to underlying diastolic heart failure versus underlying COPD -He does have bilateral crackles on auscultation -Takes Lasix at home -Diuresed well with IV Lasix given yesterday -Continue oxygen 5 L/min via nasal cannula -Started on furosemide 20 mg p.o. twice daily per cardiology  Acute kidney injury -Baseline creatinine around 0.9 -Presented with creatinine of 1.5 -Today creatinine is up to 1.7  Atrial flutter -Continue diltiazem; heart rate controlled -Started on dofetilide -Continue Eliquis  History of VTE -Continue Eliquis for anticoagulation  COPD -Patient on home oxygen 4 L/min -Does not appear to be in exacerbation  Rheumatoid  arthritis -Methotrexate on hold  Lung cancer s/p lobectomy in 2017 -Stable   Medications     apixaban  5 mg Oral BID   [START ON 06/13/2022] diltiazem  240 mg Oral Daily   docusate sodium  100 mg Oral Daily   dofetilide  250 mcg Oral BID   folic acid  1 mg Oral q morning   furosemide  20 mg Intravenous BID   [START ON 06/13/2022] magnesium oxide  400 mg Oral Daily   mometasone-formoterol  2 puff Inhalation BID   multivitamin with minerals  1 tablet Oral Daily   pantoprazole  20 mg Oral Daily   [START ON 06/13/2022] potassium chloride  20 mEq Oral Daily   potassium chloride  30 mEq Oral Once   sodium chloride flush  3 mL Intravenous Q12H   sodium chloride flush  3 mL Intravenous Q12H   sodium chloride flush  3 mL Intravenous Q12H   umeclidinium bromide  1 puff Inhalation Daily     Data Reviewed:   CBG:  Recent Labs  Lab 06/10/22 1835 06/11/22 0639 06/12/22 0607  GLUCAP 102* 93 100*    SpO2: 92 % O2 Flow Rate (L/min): 4 L/min    Vitals:   06/12/22 0500 06/12/22 1046 06/12/22 1252 06/12/22 1336  BP:  103/65  109/70  Pulse:  92    Resp:  16  20  Temp:  97.7 F (36.5 C)  98 F (36.7 C)  TempSrc:  Oral  Oral  SpO2:  93% 91% 92%  Weight: 113 kg     Height:          Data Reviewed:  Basic Metabolic Panel: Recent Labs  Lab 06/10/22 1836  06/10/22 1855 06/10/22 1858 06/11/22 0321 06/11/22 1055 06/12/22 0244  NA 140 139 138 137 137 139  K 3.8 3.6 3.7 3.7 4.1 3.9  CL 99  --  99  --  99 101  CO2 28  --   --   --  30 31  GLUCOSE 109*  --  106*  --  109* 108*  BUN 13  --  13  --  12 14  CREATININE 1.53*  --  1.50*  --  1.49* 1.71*  CALCIUM 8.9  --   --   --  8.6* 8.8*    CBC: Recent Labs  Lab 06/10/22 1836 06/10/22 1855 06/10/22 1858 06/11/22 0321 06/12/22 0244  WBC 8.9  --   --   --  9.2  NEUTROABS 7.4  --   --   --   --   HGB 10.2* 11.6* 12.2* 10.9* 9.1*  HCT 34.7* 34.0* 36.0* 32.0* 31.3*  MCV 90.1  --   --   --  90.7  PLT 331  --   --   --   278    LFT Recent Labs  Lab 06/10/22 1836 06/12/22 0244  AST 20 21  ALT 12 9  ALKPHOS 38 35*  BILITOT 1.5* 0.9  PROT 6.3* 5.4*  ALBUMIN 2.9* 2.5*     Antibiotics: Anti-infectives (From admission, onward)    None        DVT prophylaxis: Apixaban  Code Status: Full code  Family Communication: No family at bedside   CONSULTS    Objective    Physical Examination:   General-appears in no acute distress Heart-S1-S2, regular, no murmur auscultated Lungs-clear to auscultation bilaterally, no wheezing or crackles auscultated Abdomen-soft, nontender, no organomegaly Extremities-bilateral 3+ pitting edema in the lower extremities  Neuro-alert, oriented x3, no focal deficit noted  Status is: Inpatient:      Pressure Injury 06/11/22 Buttocks Left Stage 2 -  Partial thickness loss of dermis presenting as a shallow open injury with a red, pink wound bed without slough. red, edges unattached (Active)  06/11/22 2000  Location: Buttocks  Location Orientation: Left  Staging: Stage 2 -  Partial thickness loss of dermis presenting as a shallow open injury with a red, pink wound bed without slough.  Wound Description (Comments): red, edges unattached  Present on Admission: Yes     [place]   [facility] If 7PM-7AM, please contact night-coverage at [email or web address], Office  [phone]   06/12/2022, 2:34 PM  LOS: 1 day

## 2022-06-12 NOTE — Progress Notes (Signed)
Placed pt. On overnight pulse ox pt. Is also on 4L oxygen Nasal [place].

## 2022-06-13 ENCOUNTER — Inpatient Hospital Stay (HOSPITAL_COMMUNITY): Payer: Medicare PPO

## 2022-06-13 DIAGNOSIS — R55 Syncope and collapse: Secondary | ICD-10-CM

## 2022-06-13 DIAGNOSIS — J9611 Chronic respiratory failure with hypoxia: Secondary | ICD-10-CM | POA: Diagnosis not present

## 2022-06-13 DIAGNOSIS — I483 Typical atrial flutter: Secondary | ICD-10-CM | POA: Diagnosis not present

## 2022-06-13 DIAGNOSIS — I2781 Cor pulmonale (chronic): Secondary | ICD-10-CM | POA: Diagnosis not present

## 2022-06-13 DIAGNOSIS — J9621 Acute and chronic respiratory failure with hypoxia: Secondary | ICD-10-CM | POA: Diagnosis not present

## 2022-06-13 LAB — BASIC METABOLIC PANEL
Anion gap: 6 (ref 5–15)
BUN: 15 mg/dL (ref 8–23)
CO2: 31 mmol/L (ref 22–32)
Calcium: 9 mg/dL (ref 8.9–10.3)
Chloride: 102 mmol/L (ref 98–111)
Creatinine, Ser: 1.64 mg/dL — ABNORMAL HIGH (ref 0.61–1.24)
GFR, Estimated: 42 mL/min — ABNORMAL LOW (ref >60)
Glucose, Bld: 92 mg/dL (ref 70–99)
Potassium: 4.7 mmol/L (ref 3.5–5.1)
Sodium: 139 mmol/L (ref 135–145)

## 2022-06-13 LAB — GLUCOSE, CAPILLARY: Glucose-Capillary: 93 mg/dL (ref 70–99)

## 2022-06-13 LAB — MAGNESIUM: Magnesium: 2.3 mg/dL (ref 1.7–2.4)

## 2022-06-13 NOTE — Progress Notes (Signed)
NAME:  Jeffrey Stephens, MRN:  [id number], DOB:  Nov 20, 1940, LOS: 2 ADMISSION DATE:  06/10/2022, CONSULTATION DATE:  06/12/2022 REFERRING MD:  Dr. [name], CHIEF COMPLAINT:  syncope   History of Present Illness:  81 year old male with prior history significant for but not limited to COPD, chronic hypoxic respiratory failure on 4-5L HOT, lung ca s/p RLL lobectomy 2017, atrial flutter and hx of VTE on Eliquis, RA on methotrexate who presented from home after syncopal episode with prolonged altered mental status at home on 9/16.   Most history obtained from daughter at bedside who helps with his care at home.  Reports worsening exertional dyspnea dating back prior to his fall and hip fracture back in February 2023.  Then hospitalized in April 2023 with CAP, rhinovirus, and treated for atrial flutter with RVR.  Daughter reports since that admission, he has required home O2, had ongoing fluid build up in his legs, minimal mobility at home attributed to exertional dyspnea, and trouble with orthostatic hypotension (noted back on cardiology notes in March).  He is followed in our clinic by Dr. Lamonte Sakai, last seen 02/28/22 for follow-up of right sided pneumonia and rhinovirus in April 2023; was sent for follow-up chest CT on 9/14 which showed partial clearing of right sided pna from 12/2021, mild diffuse GG attenuation, interlobular septal thickening, and small bilateral effusions, and some residual small lung nodules less than 71mm.   Reports brief syncopal episode after standing up on 9/14 with reported bowel/bladder incontinence.  Daughter then increased his home diuretics over the weekend given his increasing leg edema.  Denies recent fever.  Some chronic cough, intermittently yellow/ clear.  On 9/16, daughter helped him out of recliner with few steps to sit down for dinner.  Noticed he had passed out after sitting down and wasn't breathing and blue so she attempted CPR in chair, afraid to lay him down for CPR in  fears of aspiration.  He was breathing, hypoxic in the 70s, minimally responsive on EMS arrival.  HR in 130's and normotensive.  Alert by arrival to ER but still hypoxic, requiring brief BiPAP and in atrial flutter with RVR.  VBG 7.468/ 42.9.  CTA PE neg for PE, but showed small bilateral pleural effusions, stable bibasilar GGO, and severe emphysema.  Admitted to [facility] for syncopal workup with cardiology consulting.  EEG negative and CTH non acute.  Diuresis started.  Repeat TTE shows EF 60-65 but new moderate RV dysfunction since April 2023.  Plans to start patient on Tikosyn 9/18. Underwent loop recorder placement 9/18.  Pulmonary consulted for possible pulmonary involvement related to syncope and further recommendations.   Pertinent  Medical History  Former smoker, COPD, chronic hypoxic respiratory failure on 4-5L HOT, lung ca s/p RLL lobectomy 2017, atrial flutter and hx of VTE on Eliquis, RA on methotrexate, AAA, aortic atherosclerosis, cerebral aneurysm c/b secondary hemorrhagic stroke s/p clipping in 1994, orthostatic hypotension  Significant Hospital Events: Including procedures, antibiotic start and stop dates in addition to other pertinent events   9/16 admitted to [facility]  Interim History / Subjective:   States that he feels about the same.  He was exhausted, dyspneic just with trying to sit up to the side of the bed Currently on 5 L/min, uses 4-5 L/min at home but states that he sees desaturations frequently, with almost any exertion. Diuresis was increased on 9/18  Objective   Blood pressure (!) 110/57, pulse 63, temperature 97.8 F (36.6 C), temperature source Oral, resp. rate  15, height 5\' 9"  (1.753 m), weight 112 kg, SpO2 95 %.        Intake/Output Summary (Last 24 hours) at 06/13/2022 1608 Last data filed at 06/13/2022 1100 Gross per 24 hour  Intake 208.3 ml  Output 1230 ml  Net -1021.7 ml   Filed Weights   06/12/22 0500 06/12/22 1647 06/13/22 0500  Weight: 113 kg 113 kg 112 kg    Examination: General: Chronically ill man laying in bed, somewhat uncomfortable HEENT: Oropharynx dry, no stridor Neuro: Awake, alert, interacting, answers questions appropriately, moves all extremities CV: Irregular, 60-70, no murmur PULM: Bilateral inspiratory crackles Extremities: warm/dry, 2-3+ pitting edema  Resolved Hospital Problem list    Assessment & Plan:  -COPD/emphysema without clear evidence acute exacerbation -Bilateral groundglass pulmonary infiltrates, consider inflammatory versus pulmonary edema.  Less consistent with primary or opportunistic infection (although he is immunosuppressed).  He has a history of rheumatoid arthritis, methotrexate use both of which can cause pneumonitis.  CRP elevated at 1.8 -Diastolic CHF with A-fib/flutter -Acute on chronic hypoxemic respiratory failure due to the above.  Consider also possibility of shunt physiology -Severe pulmonary hypertension with RV failure.  Based on his RV dysfunction suspect that he has been chronically hypoxemic, likely more than he or others realized.  Also question undiagnosed OSA/OHS  Recommendations: -Agree with aggressive diuresis.  Follow chest x-ray for resolution, improvement in hypoxemia and clinical status. -Wean FiO2 as able -If he continues to have diffuse infiltrates, hypoxemia with diuresis then I would favor an empiric steroid trial for possible autoimmune mediated pneumonitis. -May need to consider holding his methotrexate going forward -His deconditioning, oxygen needs preclude bronchoscopy for culture data at this time -Continue nebulized BD regimen for now: Brovana, Pulmicort, Mayer cardiology management of his atrial flutter, diuresis, heart failure.  He has been started on dofetilide.  He has a loop recorder in place -Echocardiogram/shunt study pending -Consider right heart catheterization -Needs an outpatient sleep study   Best Practice (right click and "Reselect all  SmartList Selections" daily)  Per primary team.   Labs   CBC: Recent Labs  Lab 06/10/22 1836 06/10/22 1855 06/10/22 1858 06/11/22 0321 06/12/22 0244  WBC 8.9  --   --   --  9.2  NEUTROABS 7.4  --   --   --   --   HGB 10.2* 11.6* 12.2* 10.9* 9.1*  HCT 34.7* 34.0* 36.0* 32.0* 31.3*  MCV 90.1  --   --   --  90.7  PLT 331  --   --   --  654    Basic Metabolic Panel: Recent Labs  Lab 06/10/22 1836 06/10/22 1855 06/10/22 1858 06/11/22 0321 06/11/22 1055 06/12/22 0244 06/12/22 1633 06/12/22 1906 06/13/22 0622  NA 140   < > 138 137 137 139  --  135 139  K 3.8   < > 3.7 3.7 4.1 3.9  --  3.9 4.7  CL 99  --  99  --  99 101  --  100 102  CO2 28  --   --   --  30 31  --  30 31  GLUCOSE 109*  --  106*  --  109* 108*  --  143* 92  BUN 13  --  13  --  12 14  --  14 15  CREATININE 1.53*  --  1.50*  --  1.49* 1.71*  --  1.54* 1.64*  CALCIUM 8.9  --   --   --  8.6*  8.8*  --  8.5* 9.0  MG  --   --   --   --   --   --  2.0  --  2.3   < > = values in this interval not displayed.   GFR: Estimated Creatinine Clearance: 45 mL/min (A) (by C-G formula based on SCr of 1.64 mg/dL (H)). Recent Labs  Lab 06/10/22 1836 06/10/22 1837 06/10/22 2037 06/12/22 0244 06/12/22 1601  PROCALCITON  --   --   --   --  <0.10  WBC 8.9  --   --  9.2  --   LATICACIDVEN  --  1.5 1.1  --   --     Liver Function Tests: Recent Labs  Lab 06/10/22 1836 06/12/22 0244  AST 20 21  ALT 12 9  ALKPHOS 38 35*  BILITOT 1.5* 0.9  PROT 6.3* 5.4*  ALBUMIN 2.9* 2.5*   No results for input(s): "LIPASE", "AMYLASE" in the last 168 hours. No results for input(s): "AMMONIA" in the last 168 hours.  ABG    Component Value Date/Time   PHART 7.438 06/10/2022 1855   PCO2ART 44.5 06/10/2022 1855   PO2ART 71 (L) 06/10/2022 1855   HCO3 31.1 (H) 06/11/2022 0321   TCO2 32 06/11/2022 0321   ACIDBASEDEF 2.3 (H) 05/09/2016 0520   O2SAT 65 06/11/2022 0321     Coagulation Profile: Recent Labs  Lab 06/10/22 1836   INR 1.9*    Cardiac Enzymes: No results for input(s): "CKTOTAL", "CKMB", "CKMBINDEX", "TROPONINI" in the last 168 hours.  HbA1C: Hgb A1c MFr Bld  Date/Time Value Ref Range Status  06/11/2022 01:16 AM 4.4 (L) 4.8 - 5.6 % Final    Comment:    (NOTE) Pre diabetes:          5.7%-6.4%  Diabetes:              >6.4%  Glycemic control for   <7.0% adults with diabetes   01/21/2022 02:38 AM 5.1 4.8 - 5.6 % Final    Comment:    (NOTE) **Verified by repeat analysis**         Prediabetes: 5.7 - 6.4         Diabetes: >6.4         Glycemic control for adults with diabetes: <7.0     CBG: Recent Labs  Lab 06/10/22 1835 06/11/22 0639 06/12/22 0607 06/13/22 0516  GLUCAP 102* 93 100* 93    Review of Systems:   As per HPI otherwise negative.   Past Medical History:  He,  has a past medical history of AAA (abdominal aortic aneurysm) ([place]), Abnormal nuclear cardiac imaging test (07/13/2014), Basal cell carcinoma, BPH (benign prostatic hyperplasia), Cerebral aneurysm (1994), Constipation, Depression, DVT (deep venous thrombosis) ([place]) (2001), Dysrhythmia, Frequency of urination, GERD (gastroesophageal reflux disease), History of echocardiogram, History of palpitations, Kidney stones, Lung nodule, Paroxysmal atrial flutter ([place]), Rheumatoid arthritis ([place]), Stroke ([place]) (1994), and TIA (transient ischemic attack).   Surgical History:   Past Surgical History:  Procedure Laterality Date   BASAL CELL CARCINOMA EXCISION     "LLE; top of my head"   CEREBRAL ANEURYSM REPAIR  1994   Hx of ruptured brain aneurysm Tx by Dr. Ellene Route   CYSTOSCOPY W/ STONE MANIPULATION  1980's X 1   EXCISIONAL HEMORRHOIDECTOMY  1970's   INTRAMEDULLARY (IM) NAIL INTERTROCHANTERIC Right 01/03/2022   Procedure: INTRAMEDULLARY (IM) NAIL INTERTROCHANTRIC; ASPIRATION OF RIGHT KNEE EFFUSION AND INJECTION.;  Surgeon: Meredith Pel, MD;  Location: [facility];  Service: Orthopedics;  Laterality: Right;   JOINT REPLACEMENT      LT KNEE   LOOP RECORDER INSERTION N/A 06/12/2022   Procedure: LOOP RECORDER INSERTION;  Surgeon: Sanda Klein, MD;  Location: [facility];  Service: Cardiovascular;  Laterality: N/A;   TOTAL KNEE ARTHROPLASTY Left 05/14/2014   Procedure: LEFT TOTAL KNEE ARTHROPLASTY;  Surgeon: Johnn Hai, MD;  Location: [facility];  Service: Orthopedics;  Laterality: Left;   VIDEO ASSISTED THORACOSCOPY (VATS)/WEDGE RESECTION Right 05/08/2016   Procedure: VIDEO ASSISTED THORACOSCOPY (VATS)/LUNG RESECTION;  Surgeon: Ivin Poot, MD;  Location: [facility];  Service: Thoracic;  Laterality: Right;     Social History:   reports that he quit smoking about 29 years ago. His smoking use included cigarettes. He has a 92.50 pack-year smoking history. He has never used smokeless tobacco. He reports that he does not drink alcohol and does not use drugs.   Family History:  His family history includes Cancer in his brother; Diabetes in his father; Heart attack in his father; Heart disease in his brother and father. There is no history of Hypertension or Stroke.   Allergies No Known Allergies   Home Medications  Prior to Admission medications   Medication Sig Start Date End Date Taking? Authorizing Provider  acetaminophen (TYLENOL) 500 MG tablet Take 1,000 mg by mouth every 6 (six) hours as needed for moderate pain.   Yes [provider]  albuterol (VENTOLIN HFA) 108 (90 Base) MCG/ACT inhaler TAKE 2 PUFFS BY MOUTH EVERY 6 HOURS AS NEEDED FOR WHEEZE OR SHORTNESS OF BREATH Patient taking differently: Inhale 2 puffs into the lungs every 6 (six) hours as needed for shortness of breath or wheezing. 09/20/21  Yes Collene Gobble, MD  budesonide-formoterol [facility]) 160-4.5 MCG/ACT inhaler Inhale 2 puffs into the lungs 2 (two) times daily. 01/27/22  Yes Ghimire, Henreitta Leber, MD  Cholecalciferol (VITAMIN D3 PO) Take 2,000 mcg by mouth in the morning and at bedtime.   Yes [provider]  diltiazem  (CARDIZEM CD) 180 MG 24 hr capsule Take 1 capsule (180 mg total) by mouth daily. 01/09/22 06/11/23 Yes Barb Merino, MD  docusate sodium (COLACE) 100 MG capsule Take 100 mg by mouth at bedtime.   Yes [provider]  ELIQUIS 5 MG TABS tablet TAKE 1 TABLET BY MOUTH TWICE A DAY Patient taking differently: Take 5 mg by mouth 2 (two) times daily. 11/21/21  Yes Croitoru, Mihai, MD  folic acid (FOLVITE) 1 MG tablet Take 1 mg by mouth every morning.    Yes [provider]  furosemide (LASIX) 40 MG tablet Take 1 tablet (40 mg total) by mouth daily. 01/28/22  Yes Ghimire, Henreitta Leber, MD  ipratropium-albuterol (DUONEB) 0.5-2.5 (3) MG/3ML SOLN Take 3 mLs by nebulization every 6 (six) hours as needed. 01/27/22  Yes Ghimire, Henreitta Leber, MD  lactulose (CHRONULAC) 10 GM/15ML solution Take 30 mLs (20 g total) by mouth 2 (two) times daily. Patient taking differently: Take 20 g by mouth daily as needed for moderate constipation. 01/08/22  Yes Barb Merino, MD  lansoprazole (PREVACID) 30 MG capsule Take 30 mg by mouth every morning.   Yes [provider]  methocarbamol (ROBAXIN) 500 MG tablet Take 1 tablet (500 mg total) by mouth every 6 (six) hours as needed for muscle spasms. 01/06/22  Yes Meredith Pel, MD  methotrexate 2.5 MG tablet Take 15 mg by mouth every Monday. Take 5 tablets  Caution: Chemotherapy. Protect from light.   Yes  [provider]  Multiple Vitamins-Minerals (CENTRUM SILVER ADULT 50+) TABS Take 1 tablet by mouth daily.   Yes [provider]  oxyCODONE (OXY IR/ROXICODONE) 5 MG immediate release tablet Take 1 tablet (5 mg total) by mouth daily as needed for severe pain. 03/20/22  Yes Magnant, Charles L, PA-C  potassium chloride SA (KLOR-CON M) 20 MEQ tablet Take 1 tablet (20 mEq total) by mouth daily. 01/27/22  Yes Ghimire, Henreitta Leber, MD  tiotropium (SPIRIVA HANDIHALER) 18 MCG inhalation capsule Place 1 capsule (18 mcg total) into inhaler and inhale daily.  01/27/22 01/27/23 Yes Ghimire, Henreitta Leber, MD  albuterol (PROVENTIL) (2.5 MG/3ML) 0.083% nebulizer solution Take 3 mLs (2.5 mg total) by nebulization every 6 (six) hours as needed for wheezing or shortness of breath. 02/28/22   Collene Gobble, MD     Critical care time: n/a     Baltazar Apo, MD, PhD 06/13/2022, 4:08 PM [facility] [phone] or if no answer before 7:00PM call [phone] For any issues after 7:00PM please call eLink [phone]

## 2022-06-13 NOTE — Evaluation (Signed)
Occupational Therapy Evaluation Patient Details Name: Jeffrey Stephens MRN: [id number] DOB: 03/18/1941 Today's Date: 06/13/2022   History of Present Illness 81 year old male with medical history of atrial flutter on Eliquis, VTE, lung cancer s/p lobectomy in 2017, rheumatoid arthritis on methotrexate, COPD on home O2 4 L/min presented to ED with complaints of syncope with prolonged unresponsiveness.  On EMS arrival patient was noted to be hypoxemic with O2 sats in 70s on oxygen on 4 L/min via nasal cannula.  Patient had 3 episodes of syncope in past 3 weeks.   Clinical Impression   Pt needing assist at baseline from his spouse/daughter for ADLs, typically transfers only with assistance and spends most of his day in a lift chair. Pt currently min-total A for ADLs and mod A +2 for bed mobility. Pt needing x2 rest breaks to sit up at EOB, able to sit up for ~1 min before needing to return to supine. SpO2 dropping to 80% on 4L, needing ~3 min to recover to 87-89%. RN in room increasing pt's O2 to 5L, spouse states pt uses 5L O2 when transferring at home as well. Pt presenting with impairments listed below, will follow acutely. Recommend SNF at d/c.     Recommendations for follow up therapy are one component of a multi-disciplinary discharge planning process, led by the attending physician.  Recommendations may be updated based on patient status, additional functional criteria and insurance authorization.   Follow Up Recommendations  Skilled nursing-short term rehab (<3 hours/day)    Assistance Recommended at Discharge Frequent or constant Supervision/Assistance  Patient can return home with the following A lot of help with walking and/or transfers;A lot of help with bathing/dressing/bathroom;Assistance with cooking/housework;Direct supervision/assist for medications management;Direct supervision/assist for financial management;Help with stairs or ramp for entrance;Assist for transportation     Functional Status Assessment  Patient has had a recent decline in their functional status and demonstrates the ability to make significant improvements in function in a reasonable and predictable amount of time.  Equipment Recommendations  None recommended by OT (Defer)    Recommendations for Other Services PT consult     Precautions / Restrictions Precautions Precautions: Fall;Other (comment) Precaution Comments: monitor orthostatics, HR, and O2 sats Restrictions Weight Bearing Restrictions: No      Mobility Bed Mobility Overal bed mobility: Needs Assistance Bed Mobility: Sidelying to Sit     Supine to sit: Mod assist, +2 for physical assistance Sit to supine: Mod assist, +2 for physical assistance   General bed mobility comments: pt able to sit upright at EOB for ~1 min before needing to return to supine due to fatigue, SpO2 dropping to 80%    Transfers                   General transfer comment: deferred due to fatigue/desat      Balance Overall balance assessment: Needs assistance Sitting-balance support: No upper extremity supported, Feet supported Sitting balance-Leahy Scale: Fair                                     ADL either performed or assessed with clinical judgement   ADL Overall ADL's : Needs assistance/impaired Eating/Feeding: Minimal assistance   Grooming: Minimal assistance   Upper Body Bathing: Maximal assistance   Lower Body Bathing: Maximal assistance   Upper Body Dressing : Maximal assistance   Lower Body Dressing: Total assistance   Toilet Transfer: Maximal assistance  Toileting- Clothing Manipulation and Hygiene: Maximal assistance       Functional mobility during ADLs: Maximal assistance;+2 for physical assistance       Vision   Vision Assessment?: No apparent visual deficits     Perception     Praxis      Pertinent Vitals/Pain Pain Assessment Pain Assessment: No/denies pain     Hand  Dominance Right   Extremity/Trunk Assessment Upper Extremity Assessment Upper Extremity Assessment: Generalized weakness   Lower Extremity Assessment Lower Extremity Assessment: Defer to PT evaluation   Cervical / Trunk Assessment Cervical / Trunk Assessment: Normal   Communication Communication Communication: HOH   Cognition Arousal/Alertness: Awake/alert Behavior During Therapy: WFL for tasks assessed/performed Overall Cognitive Status: Within Functional Limits for tasks assessed                                       General Comments  SpO2 90% at rest on 4L O2, desatting to 80% with sitting EOB and gentle AROM, pt needing ~3 min to recover to 87%, RN in room bumping pt up to 5L    Exercises     Shoulder Instructions      Home Living Family/patient expects to be discharged to:: Private residence Living Arrangements: Spouse/significant other;Children Available Help at Discharge: Family;Available 24 hours/day Type of Home: House Home Access: Stairs to enter;Ramped entrance Entrance Stairs-Number of Steps: 4 Entrance Stairs-Rails: Right;Left Home Layout: One level     Bathroom Shower/Tub: Occupational psychologist: Standard Bathroom Accessibility: Yes   Home Equipment: Conservation officer, nature (2 wheels);BSC/3in1;Wheelchair - manual   Additional Comments: Sleeps in recliner at home; has been using outdoor chair as shower seat,bathes on BSC primarily      Prior Functioning/Environment Prior Level of Function : Needs assist         Mobility (physical): Gait   Mobility Comments: walks 2-3 steps with walker to w/c, does not do alot of walking at home ADLs Comments: assist for ADLs        OT Problem List: Decreased activity tolerance;Decreased range of motion;Decreased strength;Impaired balance (sitting and/or standing);Cardiopulmonary status limiting activity      OT Treatment/Interventions: Self-care/ADL training;Therapeutic exercise;Energy  conservation;DME and/or AE instruction;Therapeutic activities;Patient/family education;Balance training    OT Goals(Current goals can be found in the care plan section) Acute Rehab OT Goals Patient Stated Goal: to return home OT Goal Formulation: With patient Time For Goal Achievement: 06/27/22 Potential to Achieve Goals: Fair ADL Goals Pt Will Perform Grooming: sitting;with supervision Pt Will Perform Upper Body Dressing: with min guard assist;sitting Additional ADL Goal #1: pt will be able to sit EOB for ~5 min for functional task in order to improve activity tolerance for ADLs  OT Frequency: Min 2X/week    Co-evaluation              AM-PAC OT "6 Clicks" Daily Activity     Outcome Measure Help from another person eating meals?: A Little Help from another person taking care of personal grooming?: A Little Help from another person toileting, which includes using toliet, bedpan, or urinal?: A Lot Help from another person bathing (including washing, rinsing, drying)?: A Lot Help from another person to put on and taking off regular upper body clothing?: A Lot Help from another person to put on and taking off regular lower body clothing?: Total 6 Click Score: 13   End of Session Equipment Utilized  During Treatment: Oxygen Nurse Communication: Mobility status  Activity Tolerance: Patient limited by fatigue Patient left: in bed;with call bell/phone within reach;with bed alarm set;with family/visitor present  OT Visit Diagnosis: Unsteadiness on feet (R26.81);Other abnormalities of gait and mobility (R26.89);Muscle weakness (generalized) (M62.81)                Time: 3462-1947 OT Time Calculation (min): 23 min Charges:  OT General Charges $OT Visit: 1 Visit OT Evaluation $OT Eval Moderate Complexity: 1 Mod OT Treatments $Therapeutic Activity: 8-22 mins  Lynnda Child, OTD, OTR/L Acute Rehab [phone] - [place] 06/13/2022, 12:30 PM

## 2022-06-13 NOTE — TOC Progression Note (Signed)
Transition of Care [facility]) - Progression Note    Patient Details  Name: Jeffrey Stephens MRN: [id number] Date of Birth: [birth year]  Transition of Care [facility]) CM/SW [place], [place] Phone Number: 06/13/2022, 12:56 PM  Clinical Narrative:     CSW provided medicare compare ratings list with accepted SNF bed offers to patient at bedside. Patient informed CSW he is going to review with his family. CSW will follow back up with patient on SNF choice. CSW will continue to follow and assist with patients dc planning needs.  Expected Discharge Plan: [place] Barriers to Discharge: Continued Medical Work up  Expected Discharge Plan and Services Expected Discharge Plan: [place]   Discharge Planning Services: CM Consult Post Acute Care Choice: [place] Living arrangements for the past 2 months: Single Family Home                                       Social Determinants of Health (SDOH) Interventions    Readmission Risk Interventions     No data to display

## 2022-06-13 NOTE — Progress Notes (Signed)
  Echocardiogram 2D Echocardiogram has been performed.  Jeffrey Stephens 06/13/2022, 5:48 PM

## 2022-06-13 NOTE — Progress Notes (Signed)
Pt refused to wear the CPAP tonight, states he will call if he wants to wear one.

## 2022-06-13 NOTE — Progress Notes (Signed)
Pharmacy: Dofetilide (Tikosyn) - Follow Up Assessment and Electrolyte Replacement  Pharmacy consulted to assist in monitoring and replacing electrolytes in this 81 y.o. male admitted on 06/10/2022 undergoing dofetilide initiation.   Labs:    Component Value Date/Time   K 4.7 06/13/2022 0622   MG 2.3 06/13/2022 0622     Plan: Potassium: K >/= 4: No additional supplementation needed  Magnesium: Mg > 2: No additional supplementation needed   Thank you for allowing pharmacy to participate in this patient's care   Hildred Laser, PharmD Clinical Pharmacist **Pharmacist phone directory can now be found on [email or web address] ([email or web address]).  Listed under [place].

## 2022-06-13 NOTE — Progress Notes (Signed)
Rounding Note    Patient Name: Jeffrey Stephens Date of Encounter: 06/13/2022  [place] Cardiologist: Sanda Klein, MD   Subjective    Remains in atrial flutter but rate control is greatly improved, rarely exceeds 80 beats a minute. Continues to require 4 L oxygen by nasal cannula at rest. When he inadvertently came off O2 (he took the nebulizer mask off without nasal cannula being reconnected), desaturated to 64% in a matter of minutes, with overt cyanosis and slight confusion.  Inpatient Medications    Scheduled Meds:  apixaban  5 mg Oral BID   arformoterol  15 mcg Nebulization BID   budesonide (PULMICORT) nebulizer solution  0.5 mg Nebulization BID   diltiazem  240 mg Oral Daily   docusate sodium  100 mg Oral Daily   dofetilide  250 mcg Oral BID   folic acid  1 mg Oral q morning   furosemide  40 mg Intravenous BID   magnesium oxide  400 mg Oral Daily   multivitamin with minerals  1 tablet Oral Daily   pantoprazole  20 mg Oral Daily   potassium chloride  20 mEq Oral Daily   revefenacin  175 mcg Nebulization Daily   sodium chloride flush  3 mL Intravenous Q12H   sodium chloride flush  3 mL Intravenous Q12H   sodium chloride flush  3 mL Intravenous Q12H   Continuous Infusions:  sodium chloride     sodium chloride     PRN Meds: sodium chloride, sodium chloride, acetaminophen **OR** acetaminophen, acetaminophen, albuterol, ipratropium-albuterol, sodium chloride flush, sodium chloride flush   Vital Signs    Vitals:   06/12/22 2243 06/13/22 0200 06/13/22 0500 06/13/22 0753  BP:   (!) 114/59 (!) 114/55  Pulse: 87  63 63  Resp: 18  20 18   Temp:   98 F (36.7 C) 97.6 F (36.4 C)  TempSrc:   Oral Oral  SpO2: 96% 94% 96% 93%  Weight:   112 kg   Height:        Intake/Output Summary (Last 24 hours) at 06/13/2022 0858 Last data filed at 06/13/2022 0843 Gross per 24 hour  Intake 208.3 ml  Output 930 ml  Net -721.7 ml      06/13/2022    5:00 AM  06/12/2022    4:47 PM 06/12/2022    5:00 AM  Last 3 Weights  Weight (lbs) 246 lb 14.6 oz 249 lb 1.9 oz 249 lb 1.9 oz  Weight (kg) 112 kg 113 kg 113 kg      Telemetry    Atrial flutter with mostly 4: 1 AV block, good rate control- Personally Reviewed  ECG    Atrial flutter with variable AV block, QTc 449 ms- Personally Reviewed  Physical Exam  Cyanotic GEN: No acute distress.   Neck: No JVD Cardiac: RRR, no murmurs, rubs, or gallops.  Slight ooze on dressing from loop recorder site, no active bleeding Respiratory: Diminished bilaterally, but no audible wheezes or rales GI: Soft, nontender, non-distended  MS: 3+ symmetrical lower extremity; No deformity. Neuro:  Nonfocal  Psych: Normal affect   Labs    High Sensitivity Troponin:   Recent Labs  Lab 06/10/22 1836 06/10/22 2037 06/11/22 0116 06/11/22 0429  TROPONINIHS 14 16 16 15      Chemistry Recent Labs  Lab 06/10/22 1836 06/10/22 1855 06/12/22 0244 06/12/22 1633 06/12/22 1906 06/13/22 0622  NA 140   < > 139  --  135 139  K 3.8   < >  3.9  --  3.9 4.7  CL 99   < > 101  --  100 102  CO2 28   < > 31  --  30 31  GLUCOSE 109*   < > 108*  --  143* 92  BUN 13   < > 14  --  14 15  CREATININE 1.53*   < > 1.71*  --  1.54* 1.64*  CALCIUM 8.9   < > 8.8*  --  8.5* 9.0  MG  --   --   --  2.0  --  2.3  PROT 6.3*  --  5.4*  --   --   --   ALBUMIN 2.9*  --  2.5*  --   --   --   AST 20  --  21  --   --   --   ALT 12  --  9  --   --   --   ALKPHOS 38  --  35*  --   --   --   BILITOT 1.5*  --  0.9  --   --   --   GFRNONAA 46*   < > 40*  --  46* 42*  ANIONGAP 13   < > 7  --  5 6   < > = values in this interval not displayed.    Lipids No results for input(s): "CHOL", "TRIG", "HDL", "LABVLDL", "LDLCALC", "CHOLHDL" in the last 168 hours.  Hematology Recent Labs  Lab 06/10/22 1836 06/10/22 1855 06/10/22 1858 06/11/22 0321 06/12/22 0244  WBC 8.9  --   --   --  9.2  RBC 3.85*  --   --   --  3.45*  HGB 10.2*   < > 12.2*  10.9* 9.1*  HCT 34.7*   < > 36.0* 32.0* 31.3*  MCV 90.1  --   --   --  90.7  MCH 26.5  --   --   --  26.4  MCHC 29.4*  --   --   --  29.1*  RDW 24.2*  --   --   --  24.4*  PLT 331  --   --   --  278   < > = values in this interval not displayed.   Thyroid  Recent Labs  Lab 06/11/22 0116  TSH 2.479    BNP Recent Labs  Lab 06/10/22 1837  BNP 224.2*    DDimer No results for input(s): "DDIMER" in the last 168 hours.   Radiology    VAS US CAROTID  Result Date: 06/12/2022 Carotid Arterial Duplex Study Patient Name:  MUAAZ BRAU  Date of Exam:   06/11/2022 Medical Rec #: [id number]        Accession #:    [id number] Date of Birth: 08-04-1941       Patient Gender: M Patient Age:   81 years Exam Location:  [facility] Procedure:      VAS US CAROTID Referring Phys: Myles Rosenthal --------------------------------------------------------------------------------  Indications:       Syncope. Risk Factors:      Past history of smoking, prior CVA. Other Factors:     AAA, AFIB, TIA, CKD. Limitations        Today's exam was limited due to heavy calcification and the                    resulting shadowing, body habitus and the body habitus of the  patient. Comparison Study:  No previous exams Performing Technologist: Jody Hill RVT, RDMS  Examination Guidelines: A complete evaluation includes B-mode imaging, spectral Doppler, color Doppler, and power Doppler as needed of all accessible portions of each vessel. Bilateral testing is considered an integral part of a complete examination. Limited examinations for reoccurring indications may be performed as noted.  Right Carotid Findings: +----------+--------+--------+--------+------------------+------------------+           PSV cm/sEDV cm/sStenosisPlaque DescriptionComments           +----------+--------+--------+--------+------------------+------------------+ CCA Prox  85      9                                                     +----------+--------+--------+--------+------------------+------------------+ CCA Distal73      9                                 intimal thickening +----------+--------+--------+--------+------------------+------------------+ ICA Prox  142     22      40-59%  calcific          Shadowing          +----------+--------+--------+--------+------------------+------------------+ ICA Mid   78      17                                                   +----------+--------+--------+--------+------------------+------------------+ ICA Distal48      12                                                   +----------+--------+--------+--------+------------------+------------------+ ECA       170                                                          +----------+--------+--------+--------+------------------+------------------+ +----------+--------+-------+----------------+-------------------+           PSV cm/sEDV cmsDescribe        Arm Pressure (mmHG) +----------+--------+-------+----------------+-------------------+ [id number]             Multiphasic, WNL                    +----------+--------+-------+----------------+-------------------+ +---------+--------+--+--------+-+---------+ VertebralPSV cm/s32EDV cm/s7Antegrade +---------+--------+--+--------+-+---------+  Left Carotid Findings: +----------+--------+--------+--------+------------------+------------------+           PSV cm/sEDV cm/sStenosisPlaque DescriptionComments           +----------+--------+--------+--------+------------------+------------------+ CCA Prox  110     10                                                   +----------+--------+--------+--------+------------------+------------------+ CCA Distal96      16                                                   +----------+--------+--------+--------+------------------+------------------+  ICA Prox  66      16                                 intimal thickening +----------+--------+--------+--------+------------------+------------------+ ICA Distal71      14                                                   +----------+--------+--------+--------+------------------+------------------+ ECA       67                                                           +----------+--------+--------+--------+------------------+------------------+ +----------+--------+--------+----------------+-------------------+           PSV cm/sEDV cm/sDescribe        Arm Pressure (mmHG) +----------+--------+--------+----------------+-------------------+ [id number]              Multiphasic, WNL                    +----------+--------+--------+----------------+-------------------+ +---------+--------+--+--------+--+---------+ VertebralPSV cm/s43EDV cm/s10Antegrade +---------+--------+--+--------+--+---------+   Summary: Right Carotid: Velocities in the right ICA are consistent with a 40-59%                stenosis. The CCA & ECA were near-normal with only minimal wall                thickening or plaque. Left Carotid: The extracranial vessels were near-normal with only minimal wall               thickening or plaque. Vertebrals:  Bilateral vertebral arteries demonstrate antegrade flow. Subclavians: Normal flow hemodynamics were seen in bilateral subclavian              arteries. *See table(s) above for measurements and observations.  Electronically signed by Monica Martinez MD on 06/12/2022 at 2:55:01 PM.    Final    EP PPM/ICD IMPLANT  Result Date: 06/12/2022 Successful loop recorder implantation.   ECHOCARDIOGRAM COMPLETE  Result Date: 06/12/2022    ECHOCARDIOGRAM REPORT   Patient Name:   ABIR CRAINE Date of Exam: 06/12/2022 Medical Rec #:  [id number]       Height:       69.0 in Accession #:    [id number]      Weight:       249.1 lb Date of Birth:  1941-04-07      BSA:          2.268 m Patient Age:    88 years        BP:            112/68 mmHg Patient Gender: M               HR:           131 bpm. Exam Location:  Inpatient Procedure: 2D Echo Indications:    syncope  History:        Patient has prior history of Echocardiogram examinations, most                 recent 01/20/2022. COPD, Arrythmias:Atrial Flutter and Atrial  Fibrillation; Signs/Symptoms:Edema.  Sonographer:    Harvie Junior Referring Phys: [id number] SARA-MAIZ A THOMAS  Sonographer Comments: Technically difficult study due to poor echo windows. Patient in chair and Image acquisition challenging due to respiratory motion. IMPRESSIONS  1. Left ventricular ejection fraction, by estimation, is 60 to 65%. The left ventricle has normal function. The left ventricle has no regional wall motion abnormalities. Left ventricular diastolic function could not be evaluated.  2. Right ventricular systolic function is moderately reduced. The right ventricular size is mildly enlarged.  3. The mitral valve is normal in structure. Trivial mitral valve regurgitation. No evidence of mitral stenosis.  4. The aortic valve is tricuspid. Aortic valve regurgitation is not visualized. Aortic valve sclerosis is present, with no evidence of aortic valve stenosis. Aortic valve area, by VTI measures 2.79 cm. Aortic valve mean gradient measures 3.0 mmHg. Aortic valve Vmax measures 1.09 m/s.  5. There is borderline dilatation of the aortic root, measuring 37 mm. There is mild dilatation of the ascending aorta, measuring 39 mm. FINDINGS  Left Ventricle: Left ventricular ejection fraction, by estimation, is 60 to 65%. The left ventricle has normal function. The left ventricle has no regional wall motion abnormalities. The left ventricular internal cavity size was normal in size. There is  no left ventricular hypertrophy. Left ventricular diastolic function could not be evaluated due to atrial fibrillation. Left ventricular diastolic function could not be evaluated. Normal left ventricular filling  pressure. Right Ventricle: The right ventricular size is mildly enlarged. No increase in right ventricular wall thickness. Right ventricular systolic function is moderately reduced. Left Atrium: Left atrial size was normal in size. Right Atrium: Right atrial size was normal in size. Pericardium: Trivial pericardial effusion is present. The pericardial effusion is anterior to the right ventricle and surrounding the apex. Mitral Valve: The mitral valve is normal in structure. Trivial mitral valve regurgitation. No evidence of mitral valve stenosis. Tricuspid Valve: The tricuspid valve is normal in structure. Tricuspid valve regurgitation is trivial. No evidence of tricuspid stenosis. Aortic Valve: The aortic valve is tricuspid. Aortic valve regurgitation is not visualized. Aortic valve sclerosis is present, with no evidence of aortic valve stenosis. Aortic valve mean gradient measures 3.0 mmHg. Aortic valve peak gradient measures 4.8  mmHg. Aortic valve area, by VTI measures 2.79 cm. Pulmonic Valve: The pulmonic valve was normal in structure. Pulmonic valve regurgitation is not visualized. No evidence of pulmonic stenosis. Aorta: The aortic root is normal in size and structure. There is borderline dilatation of the aortic root, measuring 37 mm. There is mild dilatation of the ascending aorta, measuring 39 mm. Venous: The inferior vena cava was not well visualized. IAS/Shunts: No atrial level shunt detected by color flow Doppler.  LEFT VENTRICLE PLAX 2D LVIDd:         3.50 cm     Diastology LVIDs:         2.30 cm     LV e' medial:    8.51 cm/s LV PW:         1.10 cm     LV E/e' medial:  13.7 LV IVS:        1.10 cm     LV e' lateral:   14.40 cm/s LVOT diam:     2.20 cm     LV E/e' lateral: 8.1 LV SV:         63 LV SV Index:   28 LVOT Area:     3.80 cm  LV Volumes (MOD) LV vol  d, MOD A2C: 63.7 ml LV vol d, MOD A4C: 58.8 ml LV vol s, MOD A2C: 28.6 ml LV vol s, MOD A4C: 24.4 ml LV SV MOD A2C:     35.1 ml LV SV MOD A4C:      58.8 ml LV SV MOD BP:      36.9 ml RIGHT VENTRICLE RV Basal diam:  4.30 cm RV Mid diam:    3.90 cm RV S prime:     16.60 cm/s TAPSE (M-mode): 1.2 cm LEFT ATRIUM           Index        RIGHT ATRIUM           Index LA diam:      3.60 cm 1.59 cm/m   RA Area:     23.60 cm LA Vol (A4C): 67.5 ml 29.76 ml/m  RA Volume:   72.00 ml  31.74 ml/m  AORTIC VALVE                    PULMONIC VALVE AV Area (Vmax):    3.56 cm     PV Vmax:       1.06 m/s AV Area (Vmean):   3.37 cm     PV Peak grad:  4.5 mmHg AV Area (VTI):     2.79 cm AV Vmax:           109.00 cm/s AV Vmean:          72.800 cm/s AV VTI:            0.225 m AV Peak Grad:      4.8 mmHg AV Mean Grad:      3.0 mmHg LVOT Vmax:         102.00 cm/s LVOT Vmean:        64.600 cm/s LVOT VTI:          0.165 m LVOT/AV VTI ratio: 0.73  AORTA Ao Root diam: 3.70 cm Ao Asc diam:  3.90 cm MITRAL VALVE                TRICUSPID VALVE MV Area (PHT): 6.07 cm     TR Peak grad:   17.1 mmHg MV Decel Time: 125 msec     TR Vmax:        207.00 cm/s MR Peak grad: 61.8 mmHg MR Vmax:      393.00 cm/s   SHUNTS MV E velocity: 117.00 cm/s  Systemic VTI:  0.16 m MV A velocity: 69.20 cm/s   Systemic Diam: 2.20 cm MV E/A ratio:  1.69 Fransico Him MD Electronically signed by Fransico Him MD Signature Date/Time: 06/12/2022/10:38:34 AM    Final    EEG adult  Result Date: 06/11/2022 Greta Doom, MD     06/11/2022 11:16 AM History: 81 year old male being evaluated for syncope Sedation: None Technique: This EEG was acquired with electrodes placed according to the International 10-20 electrode system (including Fp1, Fp2, F3, F4, C3, C4, P3, P4, O1, O2, T3, T4, T5, T6, A1, A2, Fz, Cz, Pz). The following electrodes were missing or displaced: none. Background: There is some slowing of the background with the posterior dominant rhythm having a frequency of 7 to 8 Hz.  In addition, there is mild intrusion of generalized irregular delta and theta range activities into the waking background as  well.  Sleep is not recorded.  No epileptiform discharges were seen. Photic stimulation: Physiologic driving is now performed EEG Abnormalities: 1) generalized irregular slow  activity 2) slow posterior dominant rhythm Clinical Interpretation: This EEG is consistent with a mild generalized nonspecific cerebral dysfunction (encephalopathy). There was no seizure or seizure predisposition recorded on this study. Please note that lack of epileptiform activity on EEG does not preclude the possibility of epilepsy. Roland Rack, MD [facility] [phone] If 7pm- 7am, please page neurology on call as listed in [place].    Cardiac Studies   Echocardiogram 06/12/2022    1. Left ventricular ejection fraction, by estimation, is 60 to 65%. The  left ventricle has normal function. The left ventricle has no regional  wall motion abnormalities. Left ventricular diastolic function could not  be evaluated.   2. Right ventricular systolic function is moderately reduced. The right  ventricular size is mildly enlarged.   3. The mitral valve is normal in structure. Trivial mitral valve  regurgitation. No evidence of mitral stenosis.   4. The aortic valve is tricuspid. Aortic valve regurgitation is not  visualized. Aortic valve sclerosis is present, with no evidence of aortic  valve stenosis. Aortic valve area, by VTI measures 2.79 cm. Aortic valve  mean gradient measures 3.0 mmHg.  Aortic valve Vmax measures 1.09 m/s.   5. There is borderline dilatation of the aortic root, measuring 37 mm.  There is mild dilatation of the ascending aorta, measuring 39 mm.   Patient Profile     81 y.o. male with a hx of atrial flutter (bradycardia on sotalol >> DCd), remote DVT, abdominal aortic aneurysm (followed by Dr. Scot Dock), Lung CA s/p RLL wedge resection in 2017, Chronic Obstructive Pulmonary Disease on home O2, borderline dilation of the ascending aorta, coronary artery Ca2+ on CT, aortic  atherosclerosis, cerebral aneurysm c/b secondary hemorrhagic stroke s/p clipping in 1994, orthostatic hypotension, rheumatoid arthritis who was admitted for unheralded syncope while sitting on 06/10/2022  Assessment & Plan    Syncope: suspect hypoxia related, but cannot exclude arrhythmia.  He has had symptoms of orthostatic hypotension, but was not changing position when this occurred.  Loop Corder implanted yesterday in case he has occasional breaks and what appears to be persistent atrial flutter, that might be followed by postconversion pauses.  He does have a history of severe bradycardia on sotalol which led to discontinuation of therapy.  Likely has sinus node dysfunction. AFlutter: As far as I can tell this has been persistent at least since April of this year.  Started treatment with dofetilide last night.  QTc has increased from a baseline of roughly 416-430 ms to 450 ms after the first dose.  Electrolytes are in order.  Much better rate control on current dose of diltiazem.  Fully anticoagulated. COPD/s/p lobectomy:  PFTs in 2021, years after he had already had his lobectomy, were not that bad.  He was well compensated until severe respiratory illness about 5 months ago.  On O2 ever since.  Went back and looked at his echocardiogram from January 05, 2022 and he had normal RV function at that time.  Overall impression suggests that his severe respiratory illness in April-May of this year led to a marked change in pulmonary function and cor pulmonale since then.   CHF: clinically and by echo, he has clear evidence of right heart dysfunction c/w cor pulmonale. There is less proof for left heart failure. Echo LV diastolic function analysis is limited by the atrial flutter. BNP is only very mildly abnormal at 224, but higher than earlier in the year at 143.  Started diuretics, so far with fairly meager  diuresis. Ac on chronic resp failure w hypoxia: most likely cause of syncope in my opinion. CKD3: Watch  renal function and electrolytes closely while loading dofetilide. ILR: implanted yesterday.  Looks healthy.  Will remove outer dressing tomorrow.     For questions or updates, please contact [place] Please consult [email or web address] for contact info under        Signed, Sanda Klein, MD  06/13/2022, 8:58 AM

## 2022-06-13 NOTE — Progress Notes (Signed)
[facility]  PROGRESS NOTE  Jeffrey Stephens QMG:[id number] DOB: 07/05/1941 DOA: 06/10/2022 PCP: Jeffrey Orn, MD   Brief HPI:   81 year old male with medical history of atrial flutter on Eliquis, VTE, lung cancer s/p lobectomy in 2017, rheumatoid arthritis on methotrexate, COPD on home O2 4 L/min presented to ED with complaints of syncope with prolonged unresponsiveness.  On EMS arrival patient was noted to be hypoxemic with O2 sats in 70s on oxygen on 4 L/min via nasal cannula.  Patient had 3 episodes of syncope in past 3 weeks.  There was no history of prodrome.  No bowel or bladder incontinence, no seizure-like activity. CT head was unremarkable, CTA chest showed no pulmonary embolism.   Subjective   Patient seen and examined, breathing has improved.  Was seen by pulmonology yesterday.   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   -Cardiology was consulted, underwent implanted loop recorder placement yesterday  Acute on chronic hypoxemic respiratory failure -Likely due to underlying diastolic heart failure versus underlying COPD -He does have bilateral crackles on auscultation -Takes Lasix at home -Diuresed well with IV Lasix given yesterday -Continue oxygen 5 L/min via nasal cannula -Started on furosemide 20 mg p.o. twice daily per cardiology; dose changed to furosemide 40 mg IV twice daily  Bilateral lower extremity edema -Patient has bilateral lower extremity edema likely from cor pulmonale, right heart failure from underlying COPD -Started on high-dose Lasix 40 mg twice daily -BNP only minimally elevated -Continue strict intake and output  Acute kidney injury -Baseline creatinine around 0.9 -Presented with  creatinine of 1.5 -Today creatinine is down to 1.64  Atrial flutter -Continue diltiazem; heart rate controlled -Started on dofetilide -Continue Eliquis  History of VTE -Continue Eliquis for anticoagulation  COPD -Patient on home oxygen 4 L/min -Does not appear to be in exacerbation  Rheumatoid arthritis -Methotrexate on hold  Lung cancer s/p lobectomy in 2017 -Stable   Medications     apixaban  5 mg Oral BID   arformoterol  15 mcg Nebulization BID   budesonide (PULMICORT) nebulizer solution  0.5 mg Nebulization BID   diltiazem  240 mg Oral Daily   docusate sodium  100 mg Oral Daily   dofetilide  250 mcg Oral BID   folic acid  1 mg Oral q morning   furosemide  40 mg Intravenous BID   magnesium oxide  400 mg Oral Daily   multivitamin with minerals  1 tablet Oral Daily   pantoprazole  20 mg Oral Daily   potassium chloride  20 mEq Oral Daily   revefenacin  175 mcg Nebulization Daily   sodium chloride flush  3 mL Intravenous Q12H   sodium chloride flush  3 mL Intravenous Q12H   sodium chloride flush  3 mL Intravenous Q12H     Data Reviewed:   CBG:  Recent Labs  Lab 06/10/22 1835 06/11/22 0639 06/12/22 0607 06/13/22 0516  GLUCAP 102* 93 100* 93    SpO2: (!) 87 % O2 Flow Rate (L/min): 5 L/min    Vitals:   06/13/22 0753 06/13/22 0852 06/13/22 1000 06/13/22 1124  BP: (!) 114/55   (!) 107/55  Pulse: 63 93  82  Resp: 18 18  15   Temp: 97.6 F (36.4 C)   97.6  F (36.4 C)  TempSrc: Oral   Oral  SpO2: 93% 93% (!) 87%   Weight:      Height:          Data Reviewed:  Basic Metabolic Panel: Recent Labs  Lab 06/10/22 1836 06/10/22 1855 06/10/22 1858 06/11/22 0321 06/11/22 1055 06/12/22 0244 06/12/22 1633 06/12/22 1906 06/13/22 0622  NA 140   < > 138 137 137 139  --  135 139  K 3.8   < > 3.7 3.7 4.1 3.9  --  3.9 4.7  CL 99  --  99  --  99 101  --  100 102  CO2 28  --   --   --  30 31  --  30 31  GLUCOSE 109*  --  106*  --  109* 108*  --  143*  92  BUN 13  --  13  --  12 14  --  14 15  CREATININE 1.53*  --  1.50*  --  1.49* 1.71*  --  1.54* 1.64*  CALCIUM 8.9  --   --   --  8.6* 8.8*  --  8.5* 9.0  MG  --   --   --   --   --   --  2.0  --  2.3   < > = values in this interval not displayed.    CBC: Recent Labs  Lab 06/10/22 1836 06/10/22 1855 06/10/22 1858 06/11/22 0321 06/12/22 0244  WBC 8.9  --   --   --  9.2  NEUTROABS 7.4  --   --   --   --   HGB 10.2* 11.6* 12.2* 10.9* 9.1*  HCT 34.7* 34.0* 36.0* 32.0* 31.3*  MCV 90.1  --   --   --  90.7  PLT 331  --   --   --  278    LFT Recent Labs  Lab 06/10/22 1836 06/12/22 0244  AST 20 21  ALT 12 9  ALKPHOS 38 35*  BILITOT 1.5* 0.9  PROT 6.3* 5.4*  ALBUMIN 2.9* 2.5*     Antibiotics: Anti-infectives (From admission, onward)    None        DVT prophylaxis: Apixaban  Code Status: Full code  Family Communication: No family at bedside   CONSULTS    Objective    Physical Examination:   General-appears in no acute distress Heart-S1-S2, regular, no murmur auscultated Lungs-clear to auscultation bilaterally, no wheezing or crackles auscultated Abdomen-soft, nontender, no organomegaly Extremities-no edema in the lower extremities Neuro-alert, oriented x3, no focal deficit noted  Status is: Inpatient:      Pressure Injury 06/11/22 Buttocks Left Stage 2 -  Partial thickness loss of dermis presenting as a shallow open injury with a red, pink wound bed without slough. red, edges unattached (Active)  06/11/22 2000  Location: Buttocks  Location Orientation: Left  Staging: Stage 2 -  Partial thickness loss of dermis presenting as a shallow open injury with a red, pink wound bed without slough.  Wound Description (Comments): red, edges unattached  Present on Admission: Yes     Jeffrey Stephens   [facility] If 7PM-7AM, please contact night-coverage at [email or web address], Office  [phone]   06/13/2022, 1:53 PM  LOS: 2 days

## 2022-06-13 NOTE — Progress Notes (Signed)
Physical Therapy Treatment Patient Details Name: Jeffrey Stephens MRN: [id number] DOB: [birth year] Today's Date: 06/13/2022   History of Present Illness 81 year old male with medical history of atrial flutter on Eliquis, VTE, lung cancer s/p lobectomy in 2017, rheumatoid arthritis on methotrexate, COPD on home O2 4 L/min presented to ED with complaints of syncope with prolonged unresponsiveness.  On EMS arrival patient was noted to be hypoxemic with O2 sats in 70s on oxygen on 4 L/min via nasal cannula.  Patient had 3 episodes of syncope in past 3 weeks.    PT Comments    Pt not orthostatic with treatment today but limited by fatigue and respiratory status. Do not feel like pt can manage at home at this time and updated dc recommendations to SNF. Pt on 6L O2. SpO2 94% at rest. With activity especially standing and stepping SpO2 down to 84% with pt needing 4-5 minutes of sitting recovery to come back up above 89%. BP 105-114/50's throughout except initially on sitting BP 95/50's briefly before returning to the above.   Recommendations for follow up therapy are one component of a multi-disciplinary discharge planning process, led by the attending physician.  Recommendations may be updated based on patient status, additional functional criteria and insurance authorization.  Follow Up Recommendations  Skilled nursing-short term rehab (<3 hours/day) Can patient physically be transported by private vehicle: No   Assistance Recommended at Discharge Frequent or constant Supervision/Assistance  Patient can return home with the following A lot of help with walking and/or transfers;Assistance with cooking/housework;A lot of help with bathing/dressing/bathroom;Assist for transportation;Help with stairs or ramp for entrance   Equipment Recommendations  None recommended by PT    Recommendations for Other Services       Precautions / Restrictions Precautions Precautions: Fall;Other (comment) Precaution  Comments: monitor orthostatics, HR, and O2 sats Restrictions Weight Bearing Restrictions: No     Mobility  Bed Mobility Overal bed mobility: Needs Assistance Bed Mobility: Sidelying to Sit, Sit to Sidelying, Rolling Rolling: Min assist Sidelying to sit: Mod assist     Sit to sidelying: Mod assist General bed mobility comments: Assist to bring legs off of bed, elevate trunk into sitting and bring hips to EOB. Assist to bring legs back up into bed returning to sidelying    Transfers Overall transfer level: Needs assistance Equipment used: Rolling walker (2 wheels) Transfers: Sit to/from Stand Sit to Stand: Min assist, From elevated surface           General transfer comment: Assist to bring hips up.    Ambulation/Gait             Pre-gait activities: Side stepped up toward head of bed x 2 (1-2') each time with min assist     Stairs             Wheelchair Mobility    Modified Rankin (Stroke Patients Only)       Balance Overall balance assessment: Needs assistance Sitting-balance support: No upper extremity supported, Feet supported Sitting balance-Leahy Scale: Fair     Standing balance support: Bilateral upper extremity supported, During functional activity Standing balance-Leahy Scale: Poor Standing balance comment: walker and min assist for static standing                            Cognition Arousal/Alertness: Awake/alert Behavior During Therapy: WFL for tasks assessed/performed Overall Cognitive Status: Within Functional Limits for tasks assessed  Exercises      General Comments General comments (skin integrity, edema, etc.): Pt on 6L O2. SpO2 94% at rest. With activity especially standing and stepping SpO2 down to 84% with pt needing 4-5 minutes of sitting recovery to come back up above 89%. BP 105-114/50's throughout except initially on sitting BP 95/50's briefly before  returning to the above.      Pertinent Vitals/Pain Pain Assessment Pain Assessment: No/denies pain    Home Living                          Prior Function            PT Goals (current goals can now be found in the care plan section) Progress towards PT goals: Progressing toward goals    Frequency    Min 3X/week      PT Plan Discharge plan needs to be updated    Co-evaluation              AM-PAC PT "6 Clicks" Mobility   Outcome Measure  Help needed turning from your back to your side while in a flat bed without using bedrails?: A Little Help needed moving from lying on your back to sitting on the side of a flat bed without using bedrails?: A Lot Help needed moving to and from a bed to a chair (including a wheelchair)?: A Lot Help needed standing up from a chair using your arms (e.g., wheelchair or bedside chair)?: A Little Help needed to walk in hospital room?: Total Help needed climbing 3-5 steps with a railing? : Total 6 Click Score: 12    End of Session Equipment Utilized During Treatment: Gait belt;Oxygen Activity Tolerance: Patient limited by fatigue Patient left: in bed;with call bell/phone within reach;with bed alarm set;with family/visitor present   PT Visit Diagnosis: Other abnormalities of gait and mobility (R26.89);Repeated falls (R29.6);Muscle weakness (generalized) (M62.81);Dizziness and giddiness (R42)     Time: 6283-6629 PT Time Calculation (min) (ACUTE ONLY): 32 min  Charges:  $Therapeutic Activity: 23-37 mins                     [place] 06/13/2022, 4:53 PM

## 2022-06-14 ENCOUNTER — Telehealth: Payer: Self-pay | Admitting: Pulmonary Disease

## 2022-06-14 DIAGNOSIS — I483 Typical atrial flutter: Secondary | ICD-10-CM | POA: Diagnosis not present

## 2022-06-14 DIAGNOSIS — J9621 Acute and chronic respiratory failure with hypoxia: Secondary | ICD-10-CM | POA: Diagnosis not present

## 2022-06-14 DIAGNOSIS — I50812 Chronic right heart failure: Secondary | ICD-10-CM

## 2022-06-14 DIAGNOSIS — R55 Syncope and collapse: Secondary | ICD-10-CM | POA: Diagnosis not present

## 2022-06-14 LAB — HEMOGLOBIN AND HEMATOCRIT, BLOOD
HCT: 30.3 % — ABNORMAL LOW (ref 39.0–52.0)
Hemoglobin: 9 g/dL — ABNORMAL LOW (ref 13.0–17.0)

## 2022-06-14 LAB — CBC
HCT: 30.5 % — ABNORMAL LOW (ref 39.0–52.0)
Hemoglobin: 9.1 g/dL — ABNORMAL LOW (ref 13.0–17.0)
MCH: 26.8 pg (ref 26.0–34.0)
MCHC: 29.8 g/dL — ABNORMAL LOW (ref 30.0–36.0)
MCV: 90 fL (ref 80.0–100.0)
Platelets: 224 10*3/uL (ref 150–400)
RBC: 3.39 MIL/uL — ABNORMAL LOW (ref 4.22–5.81)
RDW: 25 % — ABNORMAL HIGH (ref 11.5–15.5)
WBC: 12.7 10*3/uL — ABNORMAL HIGH (ref 4.0–10.5)
nRBC: 0.2 % (ref 0.0–0.2)

## 2022-06-14 LAB — BASIC METABOLIC PANEL
Anion gap: 11 (ref 5–15)
BUN: 15 mg/dL (ref 8–23)
CO2: 28 mmol/L (ref 22–32)
Calcium: 9.1 mg/dL (ref 8.9–10.3)
Chloride: 99 mmol/L (ref 98–111)
Creatinine, Ser: 1.63 mg/dL — ABNORMAL HIGH (ref 0.61–1.24)
GFR, Estimated: 43 mL/min — ABNORMAL LOW (ref >60)
Glucose, Bld: 93 mg/dL (ref 70–99)
Potassium: 4.4 mmol/L (ref 3.5–5.1)
Sodium: 138 mmol/L (ref 135–145)

## 2022-06-14 LAB — MAGNESIUM: Magnesium: 2.1 mg/dL (ref 1.7–2.4)

## 2022-06-14 MED ORDER — DILTIAZEM HCL 60 MG PO TABS: 30.0000 mg | ORAL_TABLET | Freq: Three times a day (TID) | ORAL | Status: DC | PRN

## 2022-06-14 MED ORDER — POTASSIUM CHLORIDE CRYS ER 20 MEQ PO TBCR
20.0000 meq | EXTENDED_RELEASE_TABLET | Freq: Two times a day (BID) | ORAL | Status: DC
  Administered 2022-06-14 – 2022-06-15 (×3): 20 meq via ORAL
  Filled 2022-06-14 (×3): qty 1

## 2022-06-14 MED ORDER — GUAIFENESIN-DM 100-10 MG/5ML PO SYRP
10.0000 mL | ORAL_SOLUTION | ORAL | Status: DC | PRN
  Administered 2022-06-14 – 2022-06-17 (×2): 10 mL via ORAL
  Filled 2022-06-14 (×2): qty 10

## 2022-06-14 MED ORDER — FUROSEMIDE 10 MG/ML IJ SOLN
80.0000 mg | Freq: Two times a day (BID) | INTRAMUSCULAR | Status: DC
  Administered 2022-06-14 – 2022-06-15 (×3): 80 mg via INTRAVENOUS
  Filled 2022-06-14 (×3): qty 8

## 2022-06-14 MED ORDER — ENSURE ENLIVE PO LIQD
237.0000 mL | Freq: Two times a day (BID) | ORAL | Status: DC
  Administered 2022-06-14 – 2022-06-19 (×4): 237 mL via ORAL

## 2022-06-14 NOTE — Progress Notes (Signed)
Pharmacy: Dofetilide (Tikosyn) - Follow Up Assessment and Electrolyte Replacement  Pharmacy consulted to assist in monitoring and replacing electrolytes in this 81 y.o. male admitted on 06/10/2022 undergoing dofetilide initiation.   Labs:    Component Value Date/Time   K 4.4 06/14/2022 0423   MG 2.1 06/14/2022 0423     Plan: Potassium: K >/= 4: No additional supplementation needed  Magnesium: Mg > 2: No additional supplementation needed  Thank you for allowing pharmacy to participate in this patient's care   Hildred Laser, PharmD Clinical Pharmacist **Pharmacist phone directory can now be found on [email or web address] ([email or web address]).  Listed under [place].

## 2022-06-14 NOTE — Care Management Important Message (Signed)
Important Message  Patient Details  Name: Jeffrey Stephens MRN: [id number] Date of Birth: 1941/04/27   Medicare Important Message Given:  Yes     [name] 06/14/2022, 10:22 AM

## 2022-06-14 NOTE — Telephone Encounter (Signed)
PCCM:  Please arrange outpatient follow up for sleep consultation with a sleep provider.   Garner Nash, DO [facility] 06/14/2022 7:48 AM

## 2022-06-14 NOTE — Progress Notes (Signed)
Upon emptying urine canister, patient's urine dark pink tinged. Opyd MD made aware. No new orders. Advised to monitor patient.

## 2022-06-14 NOTE — Progress Notes (Addendum)
Rounding Note    Patient Name: Jeffrey Stephens Date of Encounter: 06/14/2022  [place] Cardiologist: Sanda Klein, MD   Subjective   No acute overnight events. He remains in atrial flutter today and rates are not as well controlled. Rates in the 90s to low 100s while sleeping and then up to the 120s when he wakes up and talks with me. He is still on 4L of HFNC and is not sure if his breathing is improving much. He had a coughing fit while I was in the room and coughed up some brown tinged phlegm. Following this, his O2 sats dropped to the low 80s and we had to increase his O2 up to 6L. He denies any chest pain or palpitations.  Inpatient Medications    Scheduled Meds:  apixaban  5 mg Oral BID   arformoterol  15 mcg Nebulization BID   budesonide (PULMICORT) nebulizer solution  0.5 mg Nebulization BID   diltiazem  240 mg Oral Daily   docusate sodium  100 mg Oral Daily   dofetilide  250 mcg Oral BID   folic acid  1 mg Oral q morning   furosemide  40 mg Intravenous BID   magnesium oxide  400 mg Oral Daily   multivitamin with minerals  1 tablet Oral Daily   pantoprazole  20 mg Oral Daily   potassium chloride  20 mEq Oral Daily   revefenacin  175 mcg Nebulization Daily   sodium chloride flush  3 mL Intravenous Q12H   sodium chloride flush  3 mL Intravenous Q12H   sodium chloride flush  3 mL Intravenous Q12H   Continuous Infusions:  sodium chloride     sodium chloride     PRN Meds: sodium chloride, sodium chloride, acetaminophen **OR** acetaminophen, acetaminophen, albuterol, ipratropium-albuterol, sodium chloride flush, sodium chloride flush   Vital Signs    Vitals:   06/13/22 1554 06/13/22 1958 06/13/22 2020 06/14/22 0446  BP: (!) 110/57  (!) 122/59 112/67  Pulse: 63  (!) 58 76  Resp: 15     Temp: 97.8 F (36.6 C)  97.9 F (36.6 C) 98.4 F (36.9 C)  TempSrc: Oral  Oral Oral  SpO2: 95% 97% 93% 97%  Weight:    112.3 kg  Height:        Intake/Output  Summary (Last 24 hours) at 06/14/2022 0646 Last data filed at 06/13/2022 2351 Gross per 24 hour  Intake 3 ml  Output 850 ml  Net -847 ml      06/14/2022    4:46 AM 06/13/2022    5:00 AM 06/12/2022    4:47 PM  Last 3 Weights  Weight (lbs) 247 lb 9.2 oz 246 lb 14.6 oz 249 lb 1.9 oz  Weight (kg) 112.3 kg 112 kg 113 kg      Telemetry    Atrial flutter with rates in the 90s to 120s. - Personally Reviewed  ECG    No new ECG tracing so far today. - Personally Reviewed  Physical Exam   GEN: Ill appearing Caucasian male. No acute distress.   Neck: JVD elevated. Cardiac: Tachycardic with regular rhythm. No murmurs, rubs, or gallops. Radial pulses 2+ and equal bilaterally. Respiratory: Mild increased work of breathing. Diminished breath sounds bilaterally with mild crackles noted in bases (left > right).  GI: Soft and distended/obese. Non-tender to palpation. MS: 2+ pitting edema of bilateral lower extremities. Also has some edema of bilateral upper extremities. No deformity. Skin: Warm and dry. Neuro:  No focal deficits. Psych: Normal affect. Responds appropriately.  Labs    High Sensitivity Troponin:   Recent Labs  Lab 06/10/22 1836 06/10/22 2037 06/11/22 0116 06/11/22 0429  TROPONINIHS 14 16 16 15      Chemistry Recent Labs  Lab 06/10/22 1836 06/10/22 1855 06/12/22 0244 06/12/22 1633 06/12/22 1906 06/13/22 0622 06/14/22 0423  NA 140   < > 139  --  135 139 138  K 3.8   < > 3.9  --  3.9 4.7 4.4  CL 99   < > 101  --  100 102 99  CO2 28   < > 31  --  30 31 28   GLUCOSE 109*   < > 108*  --  143* 92 93  BUN 13   < > 14  --  14 15 15   CREATININE 1.53*   < > 1.71*  --  1.54* 1.64* 1.63*  CALCIUM 8.9   < > 8.8*  --  8.5* 9.0 9.1  MG  --   --   --  2.0  --  2.3 2.1  PROT 6.3*  --  5.4*  --   --   --   --   ALBUMIN 2.9*  --  2.5*  --   --   --   --   AST 20  --  21  --   --   --   --   ALT 12  --  9  --   --   --   --   ALKPHOS 38  --  35*  --   --   --   --   BILITOT  1.5*  --  0.9  --   --   --   --   GFRNONAA 46*   < > 40*  --  46* 42* 43*  ANIONGAP 13   < > 7  --  5 6 11    < > = values in this interval not displayed.    Lipids No results for input(s): "CHOL", "TRIG", "HDL", "LABVLDL", "LDLCALC", "CHOLHDL" in the last 168 hours.  Hematology Recent Labs  Lab 06/10/22 1836 06/10/22 1855 06/11/22 0321 06/12/22 0244 06/14/22 0423  WBC 8.9  --   --  9.2 12.7*  RBC 3.85*  --   --  3.45* 3.39*  HGB 10.2*   < > 10.9* 9.1* 9.1*  HCT 34.7*   < > 32.0* 31.3* 30.5*  MCV 90.1  --   --  90.7 90.0  MCH 26.5  --   --  26.4 26.8  MCHC 29.4*  --   --  29.1* 29.8*  RDW 24.2*  --   --  24.4* 25.0*  PLT 331  --   --  278 224   < > = values in this interval not displayed.   Thyroid  Recent Labs  Lab 06/11/22 0116  TSH 2.479    BNP Recent Labs  Lab 06/10/22 1837  BNP 224.2*    DDimer No results for input(s): "DDIMER" in the last 168 hours.   Radiology    ECHOCARDIOGRAM LIMITED BUBBLE STUDY  Result Date: 06/14/2022    ECHOCARDIOGRAM LIMITED REPORT   Patient Name:   Jeffrey Stephens Date of Exam: 06/13/2022 Medical Rec #:  [id number]       Height:       69.0 in Accession #:    [id number]      Weight:       246.9 lb Date of  Birth:  November 29, 1940      BSA:          2.260 m Patient Age:    81 years        BP:           110/57 mmHg Patient Gender: M               HR:           63 bpm. Exam Location:  Inpatient Procedure: Limited Echo Indications:    syncope  History:        Patient has prior history of Echocardiogram examinations, most                 recent 06/12/2022.  Sonographer:    Johny Chess RDCS Referring Phys: [id number] Cheryle Horsfall DEWALD IMPRESSIONS  1. Agitated saline contrast bubble study was negative, with no evidence of any interatrial shunt. FINDINGS  IAS/Shunts: Agitated saline contrast was given intravenously to evaluate for intracardiac shunting. Agitated saline contrast bubble study was negative, with no evidence of any interatrial shunt. Candee Furbish MD Electronically signed by Candee Furbish MD Signature Date/Time: 06/14/2022/6:30:17 AM    Final    US Abdomen Limited RUQ (LIVER/GB)  Result Date: 06/13/2022 CLINICAL DATA:  Liver disease EXAM: ULTRASOUND ABDOMEN LIMITED RIGHT UPPER QUADRANT COMPARISON:  CT abdomen pelvis 01/26/2016 FINDINGS: Gallbladder: Gallstones: None Sludge: None Gallbladder Wall: Mildly thickened measuring up to 4 mm likely due to a combination of underdistention and hepatitis. Pericholecystic fluid: None Sonographic Murphy's Sign: Positive per technologist Common bile duct: Diameter: 8 mm.  Within normal limits for patient's age. Liver: Parenchymal echogenicity: Within normal limits Contours: Nodular Lesions: None Portal vein: Patent.  Hepatopetal flow Other: None. IMPRESSION: 1. Nodular hepatic contours, suspicious for cirrhosis. No discrete hepatic mass. 2. Mild thickening of the gallbladder wall likely due to under distension and hepatic inflammation. Electronically Signed   By: Miachel Roux M.D.   On: 06/13/2022 09:19   EP PPM/ICD IMPLANT  Result Date: 06/12/2022 Successful loop recorder implantation.   ECHOCARDIOGRAM COMPLETE  Result Date: 06/12/2022    ECHOCARDIOGRAM REPORT   Patient Name:   Jeffrey Stephens Date of Exam: 06/12/2022 Medical Rec #:  [id number]       Height:       69.0 in Accession #:    [id number]      Weight:       249.1 lb Date of Birth:  May 21, 1941      BSA:          2.268 m Patient Age:    62 years        BP:           112/68 mmHg Patient Gender: M               HR:           131 bpm. Exam Location:  Inpatient Procedure: 2D Echo Indications:    syncope  History:        Patient has prior history of Echocardiogram examinations, most                 recent 01/20/2022. COPD, Arrythmias:Atrial Flutter and Atrial                 Fibrillation; Signs/Symptoms:Edema.  Sonographer:    Harvie Junior Referring Phys: [id number] SARA-MAIZ A THOMAS  Sonographer Comments: Technically difficult study due to poor echo  windows. Patient in chair and Image acquisition challenging due to respiratory motion.  IMPRESSIONS  1. Left ventricular ejection fraction, by estimation, is 60 to 65%. The left ventricle has normal function. The left ventricle has no regional wall motion abnormalities. Left ventricular diastolic function could not be evaluated.  2. Right ventricular systolic function is moderately reduced. The right ventricular size is mildly enlarged.  3. The mitral valve is normal in structure. Trivial mitral valve regurgitation. No evidence of mitral stenosis.  4. The aortic valve is tricuspid. Aortic valve regurgitation is not visualized. Aortic valve sclerosis is present, with no evidence of aortic valve stenosis. Aortic valve area, by VTI measures 2.79 cm. Aortic valve mean gradient measures 3.0 mmHg. Aortic valve Vmax measures 1.09 m/s.  5. There is borderline dilatation of the aortic root, measuring 37 mm. There is mild dilatation of the ascending aorta, measuring 39 mm. FINDINGS  Left Ventricle: Left ventricular ejection fraction, by estimation, is 60 to 65%. The left ventricle has normal function. The left ventricle has no regional wall motion abnormalities. The left ventricular internal cavity size was normal in size. There is  no left ventricular hypertrophy. Left ventricular diastolic function could not be evaluated due to atrial fibrillation. Left ventricular diastolic function could not be evaluated. Normal left ventricular filling pressure. Right Ventricle: The right ventricular size is mildly enlarged. No increase in right ventricular wall thickness. Right ventricular systolic function is moderately reduced. Left Atrium: Left atrial size was normal in size. Right Atrium: Right atrial size was normal in size. Pericardium: Trivial pericardial effusion is present. The pericardial effusion is anterior to the right ventricle and surrounding the apex. Mitral Valve: The mitral valve is normal in structure. Trivial mitral  valve regurgitation. No evidence of mitral valve stenosis. Tricuspid Valve: The tricuspid valve is normal in structure. Tricuspid valve regurgitation is trivial. No evidence of tricuspid stenosis. Aortic Valve: The aortic valve is tricuspid. Aortic valve regurgitation is not visualized. Aortic valve sclerosis is present, with no evidence of aortic valve stenosis. Aortic valve mean gradient measures 3.0 mmHg. Aortic valve peak gradient measures 4.8  mmHg. Aortic valve area, by VTI measures 2.79 cm. Pulmonic Valve: The pulmonic valve was normal in structure. Pulmonic valve regurgitation is not visualized. No evidence of pulmonic stenosis. Aorta: The aortic root is normal in size and structure. There is borderline dilatation of the aortic root, measuring 37 mm. There is mild dilatation of the ascending aorta, measuring 39 mm. Venous: The inferior vena cava was not well visualized. IAS/Shunts: No atrial level shunt detected by color flow Doppler.  LEFT VENTRICLE PLAX 2D LVIDd:         3.50 cm     Diastology LVIDs:         2.30 cm     LV e' medial:    8.51 cm/s LV PW:         1.10 cm     LV E/e' medial:  13.7 LV IVS:        1.10 cm     LV e' lateral:   14.40 cm/s LVOT diam:     2.20 cm     LV E/e' lateral: 8.1 LV SV:         63 LV SV Index:   28 LVOT Area:     3.80 cm  LV Volumes (MOD) LV vol d, MOD A2C: 63.7 ml LV vol d, MOD A4C: 58.8 ml LV vol s, MOD A2C: 28.6 ml LV vol s, MOD A4C: 24.4 ml LV SV MOD A2C:     35.1 ml LV  SV MOD A4C:     58.8 ml LV SV MOD BP:      36.9 ml RIGHT VENTRICLE RV Basal diam:  4.30 cm RV Mid diam:    3.90 cm RV S prime:     16.60 cm/s TAPSE (M-mode): 1.2 cm LEFT ATRIUM           Index        RIGHT ATRIUM           Index LA diam:      3.60 cm 1.59 cm/m   RA Area:     23.60 cm LA Vol (A4C): 67.5 ml 29.76 ml/m  RA Volume:   72.00 ml  31.74 ml/m  AORTIC VALVE                    PULMONIC VALVE AV Area (Vmax):    3.56 cm     PV Vmax:       1.06 m/s AV Area (Vmean):   3.37 cm     PV Peak grad:   4.5 mmHg AV Area (VTI):     2.79 cm AV Vmax:           109.00 cm/s AV Vmean:          72.800 cm/s AV VTI:            0.225 m AV Peak Grad:      4.8 mmHg AV Mean Grad:      3.0 mmHg LVOT Vmax:         102.00 cm/s LVOT Vmean:        64.600 cm/s LVOT VTI:          0.165 m LVOT/AV VTI ratio: 0.73  AORTA Ao Root diam: 3.70 cm Ao Asc diam:  3.90 cm MITRAL VALVE                TRICUSPID VALVE MV Area (PHT): 6.07 cm     TR Peak grad:   17.1 mmHg MV Decel Time: 125 msec     TR Vmax:        207.00 cm/s MR Peak grad: 61.8 mmHg MR Vmax:      393.00 cm/s   SHUNTS MV E velocity: 117.00 cm/s  Systemic VTI:  0.16 m MV A velocity: 69.20 cm/s   Systemic Diam: 2.20 cm MV E/A ratio:  1.69 Fransico Him MD Electronically signed by Fransico Him MD Signature Date/Time: 06/12/2022/10:38:34 AM    Final     CV Studies   Carotid Ultrasound 06/11/2022: Summary: - Right Carotid: Velocities in the right ICA are consistent with a 40-59% stenosis. The CCA & ECA were near-normal with only minimal wall thickening or plaque.  - Left Carotid: The extracranial vessels were near-normal with only minimal  wall thickening or plaque.  - Vertebrals:  Bilateral vertebral arteries demonstrate antegrade flow.  - Subclavians: Normal flow hemodynamics were seen in bilateral subclavian arteries.  _______________  Echocardiogram 06/12/2022: Impression:  1. Left ventricular ejection fraction, by estimation, is 60 to 65%. The  left ventricle has normal function. The left ventricle has no regional  wall motion abnormalities. Left ventricular diastolic function could not  be evaluated.   2. Right ventricular systolic function is moderately reduced. The right  ventricular size is mildly enlarged.   3. The mitral valve is normal in structure. Trivial mitral valve  regurgitation. No evidence of mitral stenosis.   4. The aortic valve is tricuspid. Aortic valve regurgitation is not  visualized. Aortic  valve sclerosis is present, with no evidence of  aortic  valve stenosis. Aortic valve area, by VTI measures 2.79 cm. Aortic valve  mean gradient measures 3.0 mmHg.  Aortic valve Vmax measures 1.09 m/s.   5. There is borderline dilatation of the aortic root, measuring 37 mm.  There is mild dilatation of the ascending aorta, measuring 39 mm. _______________  Loop Recorder 06/12/2022: Successful loop recorder implantation. _______________  Limited Echocardiogram with Bubble Study 06/13/2022: Impressions:  1. Agitated saline contrast bubble study was negative, with no evidence  of any interatrial shunt.    Patient Profile     81 y.o. male with a history of coronary calcification noted on CTs dating back to at least 2010, atrial flutter on Eliquis, abdominal aortic aneurysm followed by Dr. Scot Dock, cerebral aneurysm complicated by secondary hemorrhagic stroke s/p clipping in 1994, borderline dilation of the ascending aorta, orthostatic hypotension, remote DVT, COPD on home O2, and rheumatoid arthritis who was admitted on 06/10/2022 with syncope.  Assessment & Plan    Syncope S/p Loop Recorder Patient was admitted following episode of syncope/unresponsiveness while seated at dinner table. Family attempted CPR in chair due to no evidence of breathing. Upon EMS arrival, O2 sat 76% and he was placed on a non-rebreather mask and gradually became more responsive. He ultimately required BIPAP. Syncope suspected to be hypoxia related although arrhythmia cannot be ruled out. He also has a history of orthostatic hypotension but was not changing position at time of event. He also a history of persistent atrial flutter and severe bradycardia on Sotalol which led to discontinuation of this. He likely has some sinus dysfunction. Loop recorder was placed on 06/13/2022 to help monitor for any break through tachyarrhythmia or post-conversion pauses/ bradycardia that could have caused syncope.   Persistent Atrial Flutter  Looks like he has been in persistent  atrial flutter since 12/2021. He was started on Tikosyn on 06/12/2022. QTc 378 ms on EKG last night after 3rd dose.  - Rates more uncontrolled this morning. In the 90s to low 100s while sleeping and in the 110s to 120s while sitting and talking. - Potassium 4.4 and Magnesium 2.1 today.  - Continue Tikosyn 250mg  twice daily.  - Currently on Cardizem 240mg  daily. Suspect we may need to increase this. - Continue chronic anticoagulation with Eliquis 5mg  twice daily. - May need to consider cardioversion if we cannot control rates. Will discuss with MD.  Right Sided CHF Cor Pulmonale BNP mildly elevated at 224. Echo showed LVEF of 60-65% with no regional wall motion abnormalities and mildly enlarged RV with moderate reduced systolic function. Started on IV Lasix. Documented urinary output of 850 mL yesterday and net negative 1.3 L so far this admission. Renal function stable. - Still appears volume overloaded on exam. - Continue IV Lasix 40mg  twice daily. May need to increase to 60mg  twice daily. - Patient has underlying COPD and was well compensated until severe respiratory illness about 5 months ago and has been on O2 ever since. He had normal RV function in 12/2021. Overall impression suggest this severe illness led to marked change in pulmonary function and cor pulmonale since then.  - May need to consider right heart cath this admission. Will discuss with MD.  Acute Hypoxic Respiratory Failure COPD History of Lung Cancer s/p Lobectomy in 2017 He has been on 4-5L of O2 at home since severe respiratory illness in April/May 2023. CT showed bilateral ground glass pulmonary infiltrates - consider inflammatory vs pulmonary edema. Echo  with bubble study was negative for shunt. Pulmonology consulted this admission and also had concern for possible autoimmune mediate pneumonitis given history of rheumatoid arthritis and being on Methotrexate.  - No significant improvement in breathing. Still requiring 4L of  HFNC. He had a coughing fit this morning and was coughing up brown phlegm. O2 sats dropped to low 80s after this and O2 had to be increased to 6L. - See recommendations for CHF above. - Outpatient sleep study has been arranged and Pulmonology will arrange. - Otherwise, per primary team and Pulmonology.  Orthostatic Hypotension History of orthostatic hypotension. BP currently stable. - Continue to monitor closely.  AKI Baseline creatinine around 0.9. Creatinine 1.50 on admission and peaked at 1.71 on 9/18. - Stable at 1.63 today. - Continue to monitor closely with diuresis.  ADDENDUM: Discussed with MD. Will prescribed short acting Cardizem 30mg  every 8 hours as needed for heart rates sustaining > 120 bpm. Will also plan for DCCV. Will speak with cardmaster and try to get this done this week.  Darreld Mclean, PA-C 06/14/2022 9:20 AM     For questions or updates, please contact [place] Please consult [email or web address] for contact info under        Signed, Darreld Mclean, PA-C  06/14/2022, 6:46 AM    I have seen and examined the patient along with Darreld Mclean, PA-C .  I have reviewed the chart, notes and new data.  I agree with PA/NP's note.  Key new complaints: Breathing has really not improved.  Still has lots of edema.  Weight has only gone down a couple of pounds since we started diuretics, even though reportedly net negative more than 2 L. Key examination changes: Remains in atrial flutter and is now quite consistently in 2: 1 AV block with a ventricular rate in the 120s. Key new findings / data: ECG performed at 10:40 AM shows atrial flutter with 2: 1 AV conduction and a QTC interval 481 ms   PLAN: Will complete dofetilide load tomorrow. If still in atrial flutter, scheduled for DCCV on Friday. Increase diuretics and KCl supplement. Increase diltiazem dose, but will add immediate release diltiazem since he has a history of sinus bradycardia and we can  quickly retreat on the diltiazem dose if necessary. Appreciate Dr. Agustina Caroli detailed assessment of the current pulmonary issues and possible differential diagnosis.  Sanda Klein, MD, [place] [phone] 06/14/2022, 1:13 PM

## 2022-06-14 NOTE — Progress Notes (Signed)
Pt continues to refuse CPAP.  Patient aware if he changes his mind he will have RN call RT.

## 2022-06-14 NOTE — Progress Notes (Signed)
PROGRESS NOTE    Jeffrey Stephens  DVV:[id number] DOB: 09/03/1941 DOA: 06/10/2022 PCP: Lavone Orn, MD   Brief Narrative:  81 year old male with medical history of atrial flutter on Eliquis, VTE, lung cancer s/p lobectomy in 2017, rheumatoid arthritis on methotrexate, COPD on home O2 4 L/min presented with syncope with prolonged unresponsiveness.  On EMS arrival, patient was found to be saturating in the 70s on 4 L oxygen via nasal cannula.  Apparently, patient has had 3 episodes of syncope in the last 3 weeks.  On presentation, CT of the head was unremarkable; CTA chest was negative for pulmonary embolism.  Cardiology was consulted.  EEG showed no seizure-like activity.  MRI of brain could not be performed.  Patient underwent loop recorder placement on 06/12/2022.  Currently on IV diuretics.  Assessment & Plan:   Recurrent syncope -Questionable cause.  EEG showed no seizure, showed generalized nonspecific cerebral dysfunction -MRI of brain could not be done because of patient having aneurysm clips -Cardiology following.  Underwent implanted loop recorder placement on 06/12/2022. -PT recommending SNF placement.  TOC following. -Limited bubble echo study showed no evidence of interatrial shunting  Acute on chronic hypoxic respiratory failure -Possibly from combination of diastolic heart failure and COPD -Cardiology and pulmonary following.  Pulmonary signing off today.  Outpatient follow-up with pulmonary -Still requiring 4 to 6 L oxygen via high flow nasal cannula.  Wean off as able.  Acute on chronic diastolic heart failure -Cardiology following.  Strict input and output.  Daily weights.  Fluid restriction.  Diuretics as per cardiology team: Currently on IV Lasix.  COPD without acute exacerbation -Pulmonary signing off today.  Recommended aggressive diuresis.  May need to consider holding his methotrexate moving forward because of question of autoimmune mediated pneumonitis.  If patient  continues to have infiltrates despite aggressive diuresis, patient might need empiric steroid trial -continue current nebulized regimen -Will need outpatient sleep study  Acute kidney injury -Baseline creatinine around 0.9.  Creatinine 1.68 today.  Monitor  Persistent atrial flutter with RVR -Still tachycardic.  Continue Cardizem and dofetilide.  Cardiology following.  Continue Eliquis  History of VTE -Continue Eliquis  Rheumatoid arthritis -Methotrexate on hold  Lung cancer status post lobectomy in 2017 -Stable  Generalized deconditioning -Will need SNF placement as per PT.  TOC following.  DVT prophylaxis: Eliquis Code Status: Full Family Communication: None at bedside Disposition Plan: Status is: Inpatient Remains inpatient appropriate because: Of severity of illness  Consultants: PCCM/cardiology  Procedures: Limited bubble echo study  Antimicrobials: None   Subjective: Patient seen and examined at bedside.  Denies worsening chest pain.  Still short of breath with exertion and feels weak.  No overnight fever, seizures or vomiting reported.  Objective: Vitals:   06/14/22 0808 06/14/22 0814 06/14/22 0821 06/14/22 1015  BP: 110/66 110/66 108/63 (!) 102/58  Pulse: (!) 123 (!) 123 (!) 124 (!) 122  Resp: 20  (!) 21 18  Temp: 98.3 F (36.8 C) 98 F (36.7 C) 98.5 F (36.9 C) 98.1 F (36.7 C)  TempSrc: Oral  Oral   SpO2: 98% 99% 100% 98%  Weight:      Height:        Intake/Output Summary (Last 24 hours) at 06/14/2022 1144 Last data filed at 06/14/2022 1126 Gross per 24 hour  Intake --  Output 1050 ml  Net -1050 ml   Filed Weights   06/12/22 1647 06/13/22 0500 06/14/22 0446  Weight: 113 kg 112 kg 112.3 kg  Examination:  General exam: Appears calm and comfortable.  Currently on 4 to 6 L oxygen via high flow nasal cannula.  Chronically ill and deconditioned. Respiratory system: Bilateral decreased breath sounds at bases with scattered crackles and  intermittent tachypnea Cardiovascular system: S1 & S2 heard, tachycardic gastrointestinal system: Abdomen is obese, nondistended, soft and nontender. Normal bowel sounds heard. Extremities: No cyanosis, clubbing; bilateral lower extremity edema present Central nervous system: Awake, slow to respond.  Poor historian.  No focal neurological deficits. Moving extremities Skin: No rashes, lesions or ulcers Psychiatry: Flat affect.  No signs of agitation.    Data Reviewed: I have personally reviewed following labs and imaging studies  CBC: Recent Labs  Lab 06/10/22 1836 06/10/22 1855 06/10/22 1858 06/11/22 0321 06/12/22 0244 06/14/22 0423  WBC 8.9  --   --   --  9.2 12.7*  NEUTROABS 7.4  --   --   --   --   --   HGB 10.2* 11.6* 12.2* 10.9* 9.1* 9.1*  HCT 34.7* 34.0* 36.0* 32.0* 31.3* 30.5*  MCV 90.1  --   --   --  90.7 90.0  PLT 331  --   --   --  278 354   Basic Metabolic Panel: Recent Labs  Lab 06/11/22 1055 06/12/22 0244 06/12/22 1633 06/12/22 1906 06/13/22 0622 06/14/22 0423  NA 137 139  --  135 139 138  K 4.1 3.9  --  3.9 4.7 4.4  CL 99 101  --  100 102 99  CO2 30 31  --  30 31 28   GLUCOSE 109* 108*  --  143* 92 93  BUN 12 14  --  14 15 15   CREATININE 1.49* 1.71*  --  1.54* 1.64* 1.63*  CALCIUM 8.6* 8.8*  --  8.5* 9.0 9.1  MG  --   --  2.0  --  2.3 2.1   GFR: Estimated Creatinine Clearance: 45.4 mL/min (A) (by C-G formula based on SCr of 1.63 mg/dL (H)). Liver Function Tests: Recent Labs  Lab 06/10/22 1836 06/12/22 0244  AST 20 21  ALT 12 9  ALKPHOS 38 35*  BILITOT 1.5* 0.9  PROT 6.3* 5.4*  ALBUMIN 2.9* 2.5*   No results for input(s): "LIPASE", "AMYLASE" in the last 168 hours. No results for input(s): "AMMONIA" in the last 168 hours. Coagulation Profile: Recent Labs  Lab 06/10/22 1836  INR 1.9*   Cardiac Enzymes: No results for input(s): "CKTOTAL", "CKMB", "CKMBINDEX", "TROPONINI" in the last 168 hours. BNP (last 3 results) No results for  input(s): "PROBNP" in the last 8760 hours. HbA1C: No results for input(s): "HGBA1C" in the last 72 hours. CBG: Recent Labs  Lab 06/10/22 1835 06/11/22 0639 06/12/22 0607 06/13/22 0516  GLUCAP 102* 93 100* 93   Lipid Profile: No results for input(s): "CHOL", "HDL", "LDLCALC", "TRIG", "CHOLHDL", "LDLDIRECT" in the last 72 hours. Thyroid Function Tests: No results for input(s): "TSH", "T4TOTAL", "FREET4", "T3FREE", "THYROIDAB" in the last 72 hours. Anemia Panel: No results for input(s): "VITAMINB12", "FOLATE", "FERRITIN", "TIBC", "IRON", "RETICCTPCT" in the last 72 hours. Sepsis Labs: Recent Labs  Lab 06/10/22 1837 06/10/22 2037 06/12/22 1601  PROCALCITON  --   --  <0.10  LATICACIDVEN 1.5 1.1  --     Recent Results (from the past 240 hour(s))  Respiratory (~20 pathogens) panel by PCR     Status: None   Collection Time: 06/11/22 12:46 AM   Specimen: Nasopharyngeal Swab; Respiratory  Result Value Ref Range Status   Adenovirus  NOT DETECTED NOT DETECTED Final   Coronavirus 229E NOT DETECTED NOT DETECTED Final    Comment: (NOTE) The Coronavirus on the Respiratory Panel, DOES NOT test for the novel  Coronavirus (2019 nCoV)    Coronavirus HKU1 NOT DETECTED NOT DETECTED Final   Coronavirus NL63 NOT DETECTED NOT DETECTED Final   Coronavirus OC43 NOT DETECTED NOT DETECTED Final   Metapneumovirus NOT DETECTED NOT DETECTED Final   Rhinovirus / Enterovirus NOT DETECTED NOT DETECTED Final   Influenza A NOT DETECTED NOT DETECTED Final   Influenza B NOT DETECTED NOT DETECTED Final   Parainfluenza Virus 1 NOT DETECTED NOT DETECTED Final   Parainfluenza Virus 2 NOT DETECTED NOT DETECTED Final   Parainfluenza Virus 3 NOT DETECTED NOT DETECTED Final   Parainfluenza Virus 4 NOT DETECTED NOT DETECTED Final   Respiratory Syncytial Virus NOT DETECTED NOT DETECTED Final   Bordetella pertussis NOT DETECTED NOT DETECTED Final   Bordetella Parapertussis NOT DETECTED NOT DETECTED Final    Chlamydophila pneumoniae NOT DETECTED NOT DETECTED Final   Mycoplasma pneumoniae NOT DETECTED NOT DETECTED Final    Comment: Performed at [facility], [place] [address], [place], [place] [address]         Radiology Studies: ECHOCARDIOGRAM LIMITED BUBBLE STUDY  Result Date: 06/14/2022    ECHOCARDIOGRAM LIMITED REPORT   Patient Name:   Jeffrey Stephens Date of Exam: 06/13/2022 Medical Rec #:  [id number]       Height:       69.0 in Accession #:    [id number]      Weight:       246.9 lb Date of Birth:  [birth year]      BSA:          2.260 m Patient Age:    23 years        BP:           110/57 mmHg Patient Gender: M               HR:           63 bpm. Exam Location:  Inpatient Procedure: Limited Echo Indications:    syncope  History:        Patient has prior history of Echocardiogram examinations, most                 recent 06/12/2022.  Sonographer:    Johny Chess RDCS Referring Phys: [id number] Cheryle Horsfall DEWALD IMPRESSIONS  1. Agitated saline contrast bubble study was negative, with no evidence of any interatrial shunt. FINDINGS  IAS/Shunts: Agitated saline contrast was given intravenously to evaluate for intracardiac shunting. Agitated saline contrast bubble study was negative, with no evidence of any interatrial shunt. Candee Furbish MD Electronically signed by Candee Furbish MD Signature Date/Time: 06/14/2022/6:30:17 AM    Final    US Abdomen Limited RUQ (LIVER/GB)  Result Date: 06/13/2022 CLINICAL DATA:  Liver disease EXAM: ULTRASOUND ABDOMEN LIMITED RIGHT UPPER QUADRANT COMPARISON:  CT abdomen pelvis 01/26/2016 FINDINGS: Gallbladder: Gallstones: None Sludge: None Gallbladder Wall: Mildly thickened measuring up to 4 mm likely due to a combination of underdistention and hepatitis. Pericholecystic fluid: None Sonographic Murphy's Sign: Positive per technologist Common bile duct: Diameter: 8 mm.  Within normal limits for patient's age. Liver: Parenchymal echogenicity: Within normal limits Contours:  Nodular Lesions: None Portal vein: Patent.  Hepatopetal flow Other: None. IMPRESSION: 1. Nodular hepatic contours, suspicious for cirrhosis. No discrete hepatic mass. 2. Mild thickening of the gallbladder wall likely due to under  distension and hepatic inflammation. Electronically Signed   By: Miachel Roux M.D.   On: 06/13/2022 09:19   EP PPM/ICD IMPLANT  Result Date: 06/12/2022 Successful loop recorder implantation.        Scheduled Meds:  apixaban  5 mg Oral BID   arformoterol  15 mcg Nebulization BID   budesonide (PULMICORT) nebulizer solution  0.5 mg Nebulization BID   diltiazem  240 mg Oral Daily   docusate sodium  100 mg Oral Daily   dofetilide  250 mcg Oral BID   feeding supplement  237 mL Oral BID BM   folic acid  1 mg Oral q morning   furosemide  40 mg Intravenous BID   magnesium oxide  400 mg Oral Daily   multivitamin with minerals  1 tablet Oral Daily   pantoprazole  20 mg Oral Daily   potassium chloride  20 mEq Oral Daily   revefenacin  175 mcg Nebulization Daily   sodium chloride flush  3 mL Intravenous Q12H   sodium chloride flush  3 mL Intravenous Q12H   sodium chloride flush  3 mL Intravenous Q12H   Continuous Infusions:  sodium chloride     sodium chloride            Aline August, MD [facility] 06/14/2022, 11:44 AM

## 2022-06-14 NOTE — Progress Notes (Signed)
PCCM:  Pulmonary will sign off at this time. Discussed with Dr. Starla Link. Please let us know if we can help in anyway, stable on 4L [place]. We will arrange outpatient sleep evaluation.  Garner Nash, DO [facility] 06/14/2022 7:46 AM

## 2022-06-14 NOTE — Telephone Encounter (Signed)
Scheduled pt for sleep consult with Dr Elsworth Soho on 10/4. Papers mailed as a courtesy reminder and will be printed on discharge papers.

## 2022-06-14 NOTE — TOC Progression Note (Addendum)
Transition of Care [facility]) - Progression Note    Patient Details  Name: Jeffrey Stephens MRN: [id number] Date of Birth: [birth year]  Transition of Care [facility]) CM/SW [place], [place] Phone Number: 06/14/2022, 2:32 PM  Clinical Narrative:     CSW met with Patient with patients daughter [name] and family at bedside Patient accepts SN[facility] bed at [facility] for short term rehab. Sharyn Lull with [facility] informed CSW to follow up Friday morning to confirm SNF bed for patient.CSW following to start insurance authorization for patient close to patient being medically ready for dc. CSW will continue to follow and assist with patients dc planning needs.     Expected Discharge Plan: [place] Barriers to Discharge: Continued Medical Work up  Expected Discharge Plan and Services Expected Discharge Plan: [place]   Discharge Planning Services: CM Consult Post Acute Care Choice: [place] Living arrangements for the past 2 months: Single Family Home                                       Social Determinants of Health (SDOH) Interventions    Readmission Risk Interventions     No data to display

## 2022-06-15 ENCOUNTER — Inpatient Hospital Stay (HOSPITAL_COMMUNITY): Payer: Medicare PPO

## 2022-06-15 ENCOUNTER — Ambulatory Visit: Payer: Medicare PPO | Admitting: Emergency Medicine

## 2022-06-15 DIAGNOSIS — J9621 Acute and chronic respiratory failure with hypoxia: Secondary | ICD-10-CM | POA: Diagnosis not present

## 2022-06-15 DIAGNOSIS — I50812 Chronic right heart failure: Secondary | ICD-10-CM | POA: Diagnosis not present

## 2022-06-15 DIAGNOSIS — I483 Typical atrial flutter: Secondary | ICD-10-CM | POA: Diagnosis not present

## 2022-06-15 DIAGNOSIS — R55 Syncope and collapse: Secondary | ICD-10-CM | POA: Diagnosis not present

## 2022-06-15 LAB — MAGNESIUM: Magnesium: 2.1 mg/dL (ref 1.7–2.4)

## 2022-06-15 LAB — GLUCOSE, CAPILLARY: Glucose-Capillary: 107 mg/dL — ABNORMAL HIGH (ref 70–99)

## 2022-06-15 LAB — BASIC METABOLIC PANEL
Anion gap: 8 (ref 5–15)
BUN: 19 mg/dL (ref 8–23)
CO2: 29 mmol/L (ref 22–32)
Calcium: 9.1 mg/dL (ref 8.9–10.3)
Chloride: 97 mmol/L — ABNORMAL LOW (ref 98–111)
Creatinine, Ser: 1.72 mg/dL — ABNORMAL HIGH (ref 0.61–1.24)
GFR, Estimated: 40 mL/min — ABNORMAL LOW (ref >60)
Glucose, Bld: 101 mg/dL — ABNORMAL HIGH (ref 70–99)
Potassium: 4.4 mmol/L (ref 3.5–5.1)
Sodium: 134 mmol/L — ABNORMAL LOW (ref 135–145)

## 2022-06-15 LAB — OCCULT BLOOD X 1 CARD TO LAB, STOOL: Fecal Occult Bld: POSITIVE — AB

## 2022-06-15 MED ORDER — METHYLPREDNISOLONE SODIUM SUCC 40 MG IJ SOLR
40.0000 mg | Freq: Every day | INTRAMUSCULAR | Status: DC
  Administered 2022-06-15 – 2022-06-17 (×3): 40 mg via INTRAVENOUS
  Filled 2022-06-15 (×3): qty 1

## 2022-06-15 MED ORDER — LOPERAMIDE HCL 2 MG PO CAPS
2.0000 mg | ORAL_CAPSULE | Freq: Four times a day (QID) | ORAL | Status: DC | PRN
  Administered 2022-06-15 – 2022-06-17 (×4): 2 mg via ORAL
  Filled 2022-06-15 (×4): qty 1

## 2022-06-15 NOTE — Progress Notes (Signed)
Pharmacy: Dofetilide (Tikosyn) - Follow Up Assessment and Electrolyte Replacement  Pharmacy consulted to assist in monitoring and replacing electrolytes in this 81 y.o. male admitted on 06/10/2022 undergoing dofetilide initiation.   Labs:    Component Value Date/Time   K 4.4 06/15/2022 0906   MG 2.1 06/15/2022 0906     Plan: Potassium: K >/= 4: No additional supplementation needed  Magnesium: Mg > 2: No additional supplementation needed   Thank you for allowing pharmacy to participate in this patient's care   Hildred Laser, PharmD Clinical Pharmacist **Pharmacist phone directory can now be found on [email or web address] ([email or web address]).  Listed under [place].

## 2022-06-15 NOTE — Plan of Care (Signed)
  Problem: Cardiac: Goal: Will achieve and/or maintain adequate cardiac output Outcome: Progressing   Problem: Physical Regulation: Goal: Complications related to the disease process, condition or treatment will be avoided or minimized Outcome: Progressing   Problem: Clinical Measurements: Goal: Respiratory complications will improve Outcome: Progressing Goal: Cardiovascular complication will be avoided Outcome: Progressing

## 2022-06-15 NOTE — Progress Notes (Addendum)
Physical Therapy Treatment Patient Details Name: Jeffrey Stephens MRN: [id number] DOB: 11/08/1940 Today's Date: 06/15/2022   History of Present Illness 81 year old male with medical history of atrial flutter on Eliquis, VTE, lung cancer s/p lobectomy in 2017, rheumatoid arthritis on methotrexate, COPD on home O2 4 L/min presented to ED with complaints of syncope with prolonged unresponsiveness.  On EMS arrival patient was noted to be hypoxemic with O2 sats in 70s on oxygen on 4 L/min via nasal cannula.  Patient had 3 episodes of syncope in past 3 weeks.    PT Comments    Pt with incr O2 requirements and fatigued. Pt also with several loose stools and did not feel he could mobilize at this time. Worked on strengthening ex's. Continue to recommend SNF at DC for further rehab.    Recommendations for follow up therapy are one component of a multi-disciplinary discharge planning process, led by the attending physician.  Recommendations may be updated based on patient status, additional functional criteria and insurance authorization.  Follow Up Recommendations  Skilled nursing-short term rehab (<3 hours/day) Can patient physically be transported by private vehicle: No   Assistance Recommended at Discharge Frequent or constant Supervision/Assistance  Patient can return home with the following A lot of help with walking and/or transfers;Assistance with cooking/housework;A lot of help with bathing/dressing/bathroom;Assist for transportation;Help with stairs or ramp for entrance   Equipment Recommendations  None recommended by PT    Recommendations for Other Services       Precautions / Restrictions Precautions Precautions: Fall;Other (comment) Precaution Comments: monitor orthostatics, HR, and O2 sats Restrictions Weight Bearing Restrictions: No     Mobility  Bed Mobility                    Transfers                        Ambulation/Gait                    Stairs             Wheelchair Mobility    Modified Rankin (Stroke Patients Only)       Balance                                            Cognition Arousal/Alertness: Awake/alert Behavior During Therapy: WFL for tasks assessed/performed Overall Cognitive Status: Within Functional Limits for tasks assessed                                          Exercises General Exercises - Upper Extremity Shoulder Flexion: AAROM, 10 reps, Supine General Exercises - Lower Extremity Ankle Circles/Pumps: AROM, Both, 10 reps, Supine Quad Sets: AROM, Both, 10 reps, Supine Short Arc Quad: AROM, Left, 10 reps, Supine Heel Slides: AAROM, Both, 10 reps, Supine Hip ABduction/ADduction: AAROM, Both, 10 reps, Supine Straight Leg Raises: AAROM, Both, 5 reps, Supine    General Comments General comments (skin integrity, edema, etc.): Pt on 7L O2. SpO2 >92%      Pertinent Vitals/Pain Pain Assessment Pain Assessment: No/denies pain    Home Living  Prior Function            PT Goals (current goals can now be found in the care plan section) Progress towards PT goals: Not progressing toward goals - comment    Frequency    Min 2X/week      PT Plan Current plan remains appropriate;Frequency needs to be updated    Co-evaluation              AM-PAC PT "6 Clicks" Mobility   Outcome Measure  Help needed turning from your back to your side while in a flat bed without using bedrails?: A Little Help needed moving from lying on your back to sitting on the side of a flat bed without using bedrails?: A Lot Help needed moving to and from a bed to a chair (including a wheelchair)?: A Lot Help needed standing up from a chair using your arms (e.g., wheelchair or bedside chair)?: Total Help needed to walk in hospital room?: Total Help needed climbing 3-5 steps with a railing? : Total 6 Click Score: 10    End  of Session Equipment Utilized During Treatment: Oxygen Activity Tolerance: Patient limited by fatigue;Other (comment) (loose stools) Patient left: in bed;with call bell/phone within reach;with family/visitor present (bed in slight trendelenburg with head raised for positioning) Nurse Communication: Mobility status PT Visit Diagnosis: Other abnormalities of gait and mobility (R26.89);Repeated falls (R29.6);Muscle weakness (generalized) (M62.81);Dizziness and giddiness (R42)     Time: 3267-1245 PT Time Calculation (min) (ACUTE ONLY): 20 min  Charges:  $Therapeutic Exercise: 8-22 mins                     [place] Office [place] 06/15/2022, 1:42 PM

## 2022-06-15 NOTE — Progress Notes (Signed)
PROGRESS NOTE    Jeffrey Stephens  JEH:[id number] DOB: 03/18/1941 DOA: 06/10/2022 PCP: Lavone Orn, MD   Brief Narrative:  81 year old male with medical history of atrial flutter on Eliquis, VTE, lung cancer s/p lobectomy in 2017, rheumatoid arthritis on methotrexate, COPD on home O2 4 L/min presented with syncope with prolonged unresponsiveness.  On EMS arrival, patient was found to be saturating in the 70s on 4 L oxygen via nasal cannula.  Apparently, patient has had 3 episodes of syncope in the last 3 weeks.  On presentation, CT of the head was unremarkable; CTA chest was negative for pulmonary embolism.  Cardiology was consulted.  EEG showed no seizure-like activity.  MRI of brain could not be performed.  Patient underwent loop recorder placement on 06/12/2022.  Currently on IV diuretics.  Assessment & Plan:   Recurrent syncope -Questionable cause.  EEG showed no seizure, showed generalized nonspecific cerebral dysfunction -MRI of brain could not be done because of patient having aneurysm clips -Cardiology following.  Underwent implanted loop recorder placement on 06/12/2022. -PT recommending SNF placement.  TOC following. -Limited bubble echo study showed no evidence of interatrial shunting  Acute on chronic hypoxic respiratory failure -Possibly from combination of diastolic heart failure and COPD -Cardiology following.  Pulmonary signed off on 06/14/2022.  Outpatient follow-up with pulmonary -Respiratory status worsened overnight and had to be put on 14 L oxygen via nasal cannula.  Currently on 10 L oxygen via nasal cannula.  Chest x-ray done overnight showed chronic fibrotic changes without acute abnormality.  If respiratory status does not improve, will reconsult pulmonary.  Acute on chronic diastolic heart failure -Cardiology following.  Strict input and output.  Daily weights.  Fluid restriction.  Diuretics as per cardiology team: Currently on IV Lasix.  COPD without acute  exacerbation -Pulmonary signed off on 06/14/2022.  Recommended aggressive diuresis.  May need to consider holding his methotrexate moving forward because of question of autoimmune mediated pneumonitis.  If patient continues to have infiltrates despite aggressive diuresis, patient might need empiric steroid trial -continue current nebulized regimen -Will need outpatient sleep study  Acute kidney injury -Baseline creatinine around 0.9.  Creatinine pending today.  Monitor  Persistent atrial flutter with RVR -Still tachycardic intermittently.  Continue Cardizem and dofetilide.  Cardiology following.  Continue Eliquis  History of VTE -Continue Eliquis  Rheumatoid arthritis -Methotrexate on hold  Lung cancer status post lobectomy in 2017 -Stable  Generalized deconditioning -Will need SNF placement as per PT.  TOC following.  Goals of care -Overall prognosis is guarded.  Condition currently not improving.  Consult palliative care for goals of care discussion.  DVT prophylaxis: Eliquis Code Status: Full Family Communication: Wife and daughter at bedside  disposition Plan: Status is: Inpatient Remains inpatient appropriate because: Of severity of illness  Consultants: PCCM/cardiology  Procedures: Limited bubble echo study  Antimicrobials: None   Subjective: Patient seen and examined at bedside.  Still extremely short of breath with minimal exertion.  Feels extremely weak.  Denies any current chest pain.  No fever, vomiting reported.   Objective: Vitals:   06/14/22 2033 06/14/22 2326 06/15/22 0347 06/15/22 0503  BP:  (!) 104/57 (!) 101/55 (!) 84/59  Pulse:  (!) 58 75   Resp:  20 20 20   Temp:  97.7 F (36.5 C) 98.2 F (36.8 C) 98.3 F (36.8 C)  TempSrc:  Oral Oral Oral  SpO2: 97% 96% 91%   Weight:   112.5 kg   Height:  Intake/Output Summary (Last 24 hours) at 06/15/2022 0754 Last data filed at 06/15/2022 0348 Gross per 24 hour  Intake 828 ml  Output 1800 ml  Net  -972 ml    Filed Weights   06/13/22 0500 06/14/22 0446 06/15/22 0347  Weight: 112 kg 112.3 kg 112.5 kg    Examination:  General: On 10 L oxygen.  No distress.  Looks chronically ill and deconditioned. ENT/neck: No thyromegaly.  JVD is not elevated  respiratory: Decreased breath sounds at bases bilaterally with some crackles; no wheezing  CVS: S1-S2 heard, mild intermittent tachycardia present Abdominal: Soft, nontender, slightly distended; no organomegaly, normal bowel sounds are heard Extremities: Trace lower extremity edema; no cyanosis  CNS: Awake, extremely slow to respond.  Poor historian.  No focal neurologic deficit.  Moves extremities Lymph: No obvious lymphadenopathy Skin: No obvious ecchymosis/lesions  psych: Currently not agitated.  Affect is flat. Musculoskeletal: No obvious joint swelling/deformity     Data Reviewed: I have personally reviewed following labs and imaging studies  CBC: Recent Labs  Lab 06/10/22 1836 06/10/22 1855 06/10/22 1858 06/11/22 0321 06/12/22 0244 06/14/22 0423 06/14/22 1854  WBC 8.9  --   --   --  9.2 12.7*  --   NEUTROABS 7.4  --   --   --   --   --   --   HGB 10.2*   < > 12.2* 10.9* 9.1* 9.1* 9.0*  HCT 34.7*   < > 36.0* 32.0* 31.3* 30.5* 30.3*  MCV 90.1  --   --   --  90.7 90.0  --   PLT 331  --   --   --  278 224  --    < > = values in this interval not displayed.    Basic Metabolic Panel: Recent Labs  Lab 06/11/22 1055 06/12/22 0244 06/12/22 1633 06/12/22 1906 06/13/22 0622 06/14/22 0423  NA 137 139  --  135 139 138  K 4.1 3.9  --  3.9 4.7 4.4  CL 99 101  --  100 102 99  CO2 30 31  --  30 31 28   GLUCOSE 109* 108*  --  143* 92 93  BUN 12 14  --  14 15 15   CREATININE 1.49* 1.71*  --  1.54* 1.64* 1.63*  CALCIUM 8.6* 8.8*  --  8.5* 9.0 9.1  MG  --   --  2.0  --  2.3 2.1    GFR: Estimated Creatinine Clearance: 45.4 mL/min (A) (by C-G formula based on SCr of 1.63 mg/dL (H)). Liver Function Tests: Recent Labs  Lab  06/10/22 1836 06/12/22 0244  AST 20 21  ALT 12 9  ALKPHOS 38 35*  BILITOT 1.5* 0.9  PROT 6.3* 5.4*  ALBUMIN 2.9* 2.5*    No results for input(s): "LIPASE", "AMYLASE" in the last 168 hours. No results for input(s): "AMMONIA" in the last 168 hours. Coagulation Profile: Recent Labs  Lab 06/10/22 1836  INR 1.9*    Cardiac Enzymes: No results for input(s): "CKTOTAL", "CKMB", "CKMBINDEX", "TROPONINI" in the last 168 hours. BNP (last 3 results) No results for input(s): "PROBNP" in the last 8760 hours. HbA1C: No results for input(s): "HGBA1C" in the last 72 hours. CBG: Recent Labs  Lab 06/10/22 1835 06/11/22 0639 06/12/22 0607 06/13/22 0516 06/15/22 0523  GLUCAP 102* 93 100* 93 107*    Lipid Profile: No results for input(s): "CHOL", "HDL", "LDLCALC", "TRIG", "CHOLHDL", "LDLDIRECT" in the last 72 hours. Thyroid Function Tests: No results for input(s): "  TSH", "T4TOTAL", "FREET4", "T3FREE", "THYROIDAB" in the last 72 hours. Anemia Panel: No results for input(s): "VITAMINB12", "FOLATE", "FERRITIN", "TIBC", "IRON", "RETICCTPCT" in the last 72 hours. Sepsis Labs: Recent Labs  Lab 06/10/22 1837 06/10/22 2037 06/12/22 1601  PROCALCITON  --   --  <0.10  LATICACIDVEN 1.5 1.1  --      Recent Results (from the past 240 hour(s))  Respiratory (~20 pathogens) panel by PCR     Status: None   Collection Time: 06/11/22 12:46 AM   Specimen: Nasopharyngeal Swab; Respiratory  Result Value Ref Range Status   Adenovirus NOT DETECTED NOT DETECTED Final   Coronavirus 229E NOT DETECTED NOT DETECTED Final    Comment: (NOTE) The Coronavirus on the Respiratory Panel, DOES NOT test for the novel  Coronavirus (2019 nCoV)    Coronavirus HKU1 NOT DETECTED NOT DETECTED Final   Coronavirus NL63 NOT DETECTED NOT DETECTED Final   Coronavirus OC43 NOT DETECTED NOT DETECTED Final   Metapneumovirus NOT DETECTED NOT DETECTED Final   Rhinovirus / Enterovirus NOT DETECTED NOT DETECTED Final    Influenza A NOT DETECTED NOT DETECTED Final   Influenza B NOT DETECTED NOT DETECTED Final   Parainfluenza Virus 1 NOT DETECTED NOT DETECTED Final   Parainfluenza Virus 2 NOT DETECTED NOT DETECTED Final   Parainfluenza Virus 3 NOT DETECTED NOT DETECTED Final   Parainfluenza Virus 4 NOT DETECTED NOT DETECTED Final   Respiratory Syncytial Virus NOT DETECTED NOT DETECTED Final   Bordetella pertussis NOT DETECTED NOT DETECTED Final   Bordetella Parapertussis NOT DETECTED NOT DETECTED Final   Chlamydophila pneumoniae NOT DETECTED NOT DETECTED Final   Mycoplasma pneumoniae NOT DETECTED NOT DETECTED Final    Comment: Performed at [facility], [place]. [address] [place], [place] [address]         Radiology Studies: DG CHEST PORT 1 VIEW  Result Date: 06/15/2022 CLINICAL DATA:  Chronic respiratory failure EXAM: PORTABLE CHEST 1 VIEW COMPARISON:  06/10/2022 FINDINGS: Mild cardiomegaly is noted but stable. Aortic calcifications are seen. Interstitial fibrotic changes are noted stable from the prior exam. No acute infiltrate is seen. No bony abnormality is noted. New loop recorder is noted. IMPRESSION: Chronic fibrotic changes without acute abnormality. Electronically Signed   By: Inez Catalina M.D.   On: 06/15/2022 01:49   ECHOCARDIOGRAM LIMITED BUBBLE STUDY  Result Date: 06/14/2022    ECHOCARDIOGRAM LIMITED REPORT   Patient Name:   CIRO TASHIRO Date of Exam: 06/13/2022 Medical Rec #:  [id number]       Height:       69.0 in Accession #:    [id number]      Weight:       246.9 lb Date of Birth:  1941-02-01      BSA:          2.260 m Patient Age:    4 years        BP:           110/57 mmHg Patient Gender: M               HR:           63 bpm. Exam Location:  Inpatient Procedure: Limited Echo Indications:    syncope  History:        Patient has prior history of Echocardiogram examinations, most                 recent 06/12/2022.  Sonographer:    [place] Referring Phys: [phone]  JONATHAN B  DEWALD IMPRESSIONS  1. Agitated saline contrast bubble study was negative, with no evidence of any interatrial shunt. FINDINGS  IAS/Shunts: Agitated saline contrast was given intravenously to evaluate for intracardiac shunting. Agitated saline contrast bubble study was negative, with no evidence of any interatrial shunt. Candee Furbish MD Electronically signed by Candee Furbish MD Signature Date/Time: 06/14/2022/6:30:17 AM    Final    US Abdomen Limited RUQ (LIVER/GB)  Result Date: 06/13/2022 CLINICAL DATA:  Liver disease EXAM: ULTRASOUND ABDOMEN LIMITED RIGHT UPPER QUADRANT COMPARISON:  CT abdomen pelvis 01/26/2016 FINDINGS: Gallbladder: Gallstones: None Sludge: None Gallbladder Wall: Mildly thickened measuring up to 4 mm likely due to a combination of underdistention and hepatitis. Pericholecystic fluid: None Sonographic Murphy's Sign: Positive per technologist Common bile duct: Diameter: 8 mm.  Within normal limits for patient's age. Liver: Parenchymal echogenicity: Within normal limits Contours: Nodular Lesions: None Portal vein: Patent.  Hepatopetal flow Other: None. IMPRESSION: 1. Nodular hepatic contours, suspicious for cirrhosis. No discrete hepatic mass. 2. Mild thickening of the gallbladder wall likely due to under distension and hepatic inflammation. Electronically Signed   By: Miachel Roux M.D.   On: 06/13/2022 09:19        Scheduled Meds:  apixaban  5 mg Oral BID   arformoterol  15 mcg Nebulization BID   budesonide (PULMICORT) nebulizer solution  0.5 mg Nebulization BID   diltiazem  240 mg Oral Daily   dofetilide  250 mcg Oral BID   feeding supplement  237 mL Oral BID BM   folic acid  1 mg Oral q morning   furosemide  80 mg Intravenous BID   magnesium oxide  400 mg Oral Daily   multivitamin with minerals  1 tablet Oral Daily   pantoprazole  20 mg Oral Daily   potassium chloride  20 mEq Oral BID   revefenacin  175 mcg Nebulization Daily   sodium chloride flush  3 mL Intravenous Q12H    sodium chloride flush  3 mL Intravenous Q12H   sodium chloride flush  3 mL Intravenous Q12H   Continuous Infusions:  sodium chloride     sodium chloride            Aline August, MD [facility] 06/15/2022, 7:54 AM

## 2022-06-15 NOTE — Progress Notes (Signed)
Patient refused use of CPAP for the evening 

## 2022-06-15 NOTE — TOC Progression Note (Addendum)
Transition of Care [facility]) - Progression Note    Patient Details  Name: Jeffrey Stephens MRN: [id number] Date of Birth: 1941-01-14  Transition of Care [facility]) CM/SW [place], [place] Phone Number: 06/15/2022, 1:59 PM  Clinical Narrative:     CSW to follow up with [place] close to patient being medically ready on bed availability. CSW following to start insurance authorization close to patient being medically ready for dc. CSW will continue to follow and assist with patients dc planning needs.  Expected Discharge Plan: [place] Barriers to Discharge: Continued Medical Work up  Expected Discharge Plan and Services Expected Discharge Plan: [place]   Discharge Planning Services: CM Consult Post Acute Care Choice: [place] Living arrangements for the past 2 months: Single Family Home                                       Social Determinants of Health (SDOH) Interventions    Readmission Risk Interventions     No data to display

## 2022-06-15 NOTE — Progress Notes (Signed)
Supplemental O2 requirement has increased from 6 Lpm to 14 Lpm this shift. Patient has no new complaints. Plan to check CXR.

## 2022-06-15 NOTE — Progress Notes (Signed)
NAME:  Jeffrey Stephens, MRN:  [id number], DOB:  23-Oct-1940, LOS: 4 ADMISSION DATE:  06/10/2022, CONSULTATION DATE:  06/12/2022 REFERRING MD:  Dr. [name], CHIEF COMPLAINT:  syncope   History of Present Illness:  81 year old male with prior history significant for but not limited to COPD, chronic hypoxic respiratory failure on 4-5L HOT, lung ca s/p RLL lobectomy 2017, atrial flutter and hx of VTE on Eliquis, RA on methotrexate who presented from home after syncopal episode with prolonged altered mental status at home on 9/16.   Most history obtained from daughter at bedside who helps with his care at home.  Reports worsening exertional dyspnea dating back prior to his fall and hip fracture back in February 2023.  Then hospitalized in April 2023 with CAP, rhinovirus, and treated for atrial flutter with RVR.  Daughter reports since that admission, he has required home O2, had ongoing fluid build up in his legs, minimal mobility at home attributed to exertional dyspnea, and trouble with orthostatic hypotension (noted back on cardiology notes in March).  He is followed in our clinic by Dr. Lamonte Sakai, last seen 02/28/22 for follow-up of right sided pneumonia and rhinovirus in April 2023; was sent for follow-up chest CT on 9/14 which showed partial clearing of right sided pna from 12/2021, mild diffuse GG attenuation, interlobular septal thickening, and small bilateral effusions, and some residual small lung nodules less than 4m.   Reports brief syncopal episode after standing up on 9/14 with reported bowel/bladder incontinence.  Daughter then increased his home diuretics over the weekend given his increasing leg edema.  Denies recent fever.  Some chronic cough, intermittently yellow/ clear.  On 9/16, daughter helped him out of recliner with few steps to sit down for dinner.  Noticed he had passed out after sitting down and wasn't breathing and blue so she attempted CPR in chair, afraid to lay him down for CPR in  fears of aspiration.  He was breathing, hypoxic in the 70s, minimally responsive on EMS arrival.  HR in 130's and normotensive.  Alert by arrival to ER but still hypoxic, requiring brief BiPAP and in atrial flutter with RVR.  VBG 7.468/ 42.9.  CTA PE neg for PE, but showed small bilateral pleural effusions, stable bibasilar GGO, and severe emphysema.  [facility]  EEG negative and CTH non acute.  Diuresis started.  Repeat TTE shows EF 60-65 but new moderate RV dysfunction since April 2023.  Plans to start patient on Tikosyn 9/18. Underwent loop recorder placement 9/18.  Pulmonary consulted for possible pulmonary involvement related to syncope and further recommendations.   Pertinent  Medical History  Former smoker, COPD, chronic hypoxic respiratory failure on 4-5L HOT, lung ca s/p RLL lobectomy 2017, atrial flutter and hx of VTE on Eliquis, RA on methotrexate, AAA, aortic atherosclerosis, cerebral aneurysm c/b secondary hemorrhagic stroke s/p clipping in 1994, orthostatic hypotension  Significant Hospital Events: Including procedures, antibiotic start and stop dates in addition to other pertinent events   [facility] / Subjective:   I met with patients daughter and wife at bedside today. The patient is feeling a little better. Still short of breath ands still requiring 10L HFNC   Objective   Blood pressure 106/67, pulse 85, temperature 98.6 F (37 C), temperature source Oral, resp. rate 18, height _0  (1.753 m), weight 112.5 kg, SpO2 98 %.        Intake/Output Summary (Last 24  hours) at 06/15/2022 1107 Last data filed at 06/15/2022 0348 Gross per 24 hour  Intake 471 ml  Output 1600 ml  Net -1129 ml   Filed Weights   06/13/22 0500 06/14/22 0446 06/15/22 0347  Weight: 112 kg 112.3 kg 112.5 kg   Examination: General: obese elderly male, resting in bed  HEENT: MM dry, NCAT, tracking  Neuro: alert following  commands,  CV: Irregularly irregular, normal rate  PULM: bilateral crackles, diminished breath sounds in the apices  Extremities: warm, dry, no edema   Resolved Hospital Problem list    Assessment & Plan:   Acute on Chronic hypoxemic respiratory failure  COPD/emphysema without clear evidence acute exacerbation, apical bullous disease Bilateral infiltrates and ground glass opacities - possibly related to his history of rhinovirus  - long standing methotrexate use  - rheumatoid arthritis  Basilar atelectasis, hypoventilation, obesity   -Diastolic CHF with A-fib/flutter -Acute on chronic hypoxemic respiratory failure due to the above.  Consider also possibility of shunt physiology -Severe pulmonary hypertension with RV failure.  Based on his RV dysfunction suspect that he has been chronically hypoxemic, likely more than he or others realized.  Also question undiagnosed OSA/OHS  Recommendations: Continue diuresis  Plans for cardioversion by cardiology  Long discussion with patient and family regarding abnormalities on ct  He does not have much normal appearing lung parenchyma  I am not surprised by his hypoxemia. I suspect will take a while to wean  IS Flutter, OOB, up in chair will be important to combat atelectasis and shunting  Continue brovana, pulmicort and yupelri Start empiric prednisone to see how he responds Hold MTX  Consult palliative care at request of the family.  Pulmonary will follow    Best Practice (right click and "Reselect all SmartList Selections" daily)  Per primary team.   Labs   CBC: Recent Labs  Lab 06/10/22 1836 06/10/22 1855 06/10/22 1858 06/11/22 0321 06/12/22 0244 06/14/22 0423 06/14/22 1854  WBC 8.9  --   --   --  9.2 12.7*  --   NEUTROABS 7.4  --   --   --   --   --   --   HGB 10.2*   < > 12.2* 10.9* 9.1* 9.1* 9.0*  HCT 34.7*   < > 36.0* 32.0* 31.3* 30.5* 30.3*  MCV 90.1  --   --   --  90.7 90.0  --   PLT 331  --   --   --  278 224  --     < > = values in this interval not displayed.    Basic Metabolic Panel: Recent Labs  Lab 06/12/22 0244 06/12/22 1633 06/12/22 1906 06/13/22 0622 06/14/22 0423 06/15/22 0906  NA 139  --  135 139 138 134*  K 3.9  --  3.9 4.7 4.4 4.4  CL 101  --  100 102 99 97*  CO2 31  --  _0 GLUCOSE 108*  --  143* 92 93 101*  BUN 14  --  _1 CREATININE 1.71*  --  1.54* 1.64* 1.63* 1.72*  CALCIUM 8.8*  --  8.5* 9.0 9.1 9.1  MG  --  2.0  --  2.3 2.1 2.1   GFR: Estimated Creatinine Clearance: 43.1 mL/min (A) (by C-G formula based on SCr of 1.72 mg/dL (H)). Recent Labs  Lab 06/10/22 1836 06/10/22 1837 06/10/22 2037 06/12/22 0244 06/12/22 1601 06/14/22 0423  PROCALCITON  --   --   --   --  <  0.10  --   WBC 8.9  --   --  9.2  --  12.7*  LATICACIDVEN  --  1.5 1.1  --   --   --     Liver Function Tests: Recent Labs  Lab 06/10/22 1836 06/12/22 0244  AST 20 21  ALT 12 9  ALKPHOS 38 35*  BILITOT 1.5* 0.9  PROT 6.3* 5.4*  ALBUMIN 2.9* 2.5*   No results for input(s): "LIPASE", "AMYLASE" in the last 168 hours. No results for input(s): "AMMONIA" in the last 168 hours.  ABG    Component Value Date/Time   PHART 7.438 06/10/2022 1855   PCO2ART 44.5 06/10/2022 1855   PO2ART 71 (L) 06/10/2022 1855   HCO3 31.1 (H) 06/11/2022 0321   TCO2 32 06/11/2022 0321   ACIDBASEDEF 2.3 (H) 05/09/2016 0520   O2SAT 65 06/11/2022 0321     Coagulation Profile: Recent Labs  Lab 06/10/22 1836  INR 1.9*    Cardiac Enzymes: No results for input(s): "CKTOTAL", "CKMB", "CKMBINDEX", "TROPONINI" in the last 168 hours.  HbA1C: Hgb A1c MFr Bld  Date/Time Value Ref Range Status  06/11/2022 01:16 AM 4.4 (L) 4.8 - 5.6 % Final    Comment:    (NOTE) Pre diabetes:          5.7%-6.4%  Diabetes:              >6.4%  Glycemic control for   <7.0% adults with diabetes   01/21/2022 02:38 AM 5.1 4.8 - 5.6 % Final    Comment:    (NOTE) **Verified by repeat analysis**          Prediabetes: 5.7 - 6.4         Diabetes: >6.4         Glycemic control for adults with diabetes: <7.0     CBG: Recent Labs  Lab 06/10/22 1835 06/11/22 0639 06/12/22 0607 06/13/22 0516 06/15/22 0523  GLUCAP 102* 93 100* 93 107*    Review of Systems:   As per HPI otherwise negative.   Past Medical History:  He,  has a past medical history of AAA (abdominal aortic aneurysm) ([place]), Abnormal nuclear cardiac imaging test (07/13/2014), Basal cell carcinoma, BPH (benign prostatic hyperplasia), Cerebral aneurysm (1994), Constipation, Depression, DVT (deep venous thrombosis) ([place]) (2001), Dysrhythmia, Frequency of urination, GERD (gastroesophageal reflux disease), History of echocardiogram, History of palpitations, Kidney stones, Lung nodule, Paroxysmal atrial flutter ([place]), Rheumatoid arthritis ([place]), Stroke ([place]) (1994), and TIA (transient ischemic attack).   Surgical History:   Past Surgical History:  Procedure Laterality Date   BASAL CELL CARCINOMA EXCISION     "LLE; top of my head"   CEREBRAL ANEURYSM REPAIR  1994   Hx of ruptured brain aneurysm Tx by Dr. Ellene Route   CYSTOSCOPY W/ STONE MANIPULATION  1980's X 1   EXCISIONAL HEMORRHOIDECTOMY  1970's   INTRAMEDULLARY (IM) NAIL INTERTROCHANTERIC Right 01/03/2022   Procedure: INTRAMEDULLARY (IM) NAIL INTERTROCHANTRIC; ASPIRATION OF RIGHT KNEE EFFUSION AND INJECTION.;  Surgeon: Meredith Pel, MD;  Location: [facility];  Service: Orthopedics;  Laterality: Right;   JOINT REPLACEMENT     LT KNEE   LOOP RECORDER INSERTION N/A 06/12/2022   Procedure: LOOP RECORDER INSERTION;  Surgeon: Sanda Klein, MD;  Location: [facility];  Service: Cardiovascular;  Laterality: N/A;   TOTAL KNEE ARTHROPLASTY Left 05/14/2014   Procedure: LEFT TOTAL KNEE ARTHROPLASTY;  Surgeon: Johnn Hai, MD;  Location: [facility];  Service: Orthopedics;  Laterality: Left;   VIDEO ASSISTED  THORACOSCOPY (VATS)/WEDGE RESECTION Right 05/08/2016   Procedure: VIDEO  ASSISTED THORACOSCOPY (VATS)/LUNG RESECTION;  Surgeon: Ivin Poot, MD;  Location: [facility];  Service: Thoracic;  Laterality: Right;     Social History:   reports that he quit smoking about 29 years ago. His smoking use included cigarettes. He has a 92.50 pack-year smoking history. He has never used smokeless tobacco. He reports that he does not drink alcohol and does not use drugs.   Family History:  His family history includes Cancer in his brother; Diabetes in his father; Heart attack in his father; Heart disease in his brother and father. There is no history of Hypertension or Stroke.   Allergies No Known Allergies   Home Medications  Prior to Admission medications   Medication Sig Start Date End Date Taking? Authorizing Provider  acetaminophen (TYLENOL) 500 MG tablet Take 1,000 mg by mouth every 6 (six) hours as needed for moderate pain.   Yes [provider]  albuterol (VENTOLIN HFA) 108 (90 Base) MCG/ACT inhaler TAKE 2 PUFFS BY MOUTH EVERY 6 HOURS AS NEEDED FOR WHEEZE OR SHORTNESS OF BREATH Patient taking differently: Inhale 2 puffs into the lungs every 6 (six) hours as needed for shortness of breath or wheezing. 09/20/21  Yes Collene Gobble, MD  budesonide-formoterol [facility]) 160-4.5 MCG/ACT inhaler Inhale 2 puffs into the lungs 2 (two) times daily. 01/27/22  Yes Ghimire, Henreitta Leber, MD  Cholecalciferol (VITAMIN D3 PO) Take 2,000 mcg by mouth in the morning and at bedtime.   Yes [provider]  diltiazem (CARDIZEM CD) 180 MG 24 hr capsule Take 1 capsule (180 mg total) by mouth daily. 01/09/22 06/11/23 Yes Barb Merino, MD  docusate sodium (COLACE) 100 MG capsule Take 100 mg by mouth at bedtime.   Yes [provider]  ELIQUIS 5 MG TABS tablet TAKE 1 TABLET BY MOUTH TWICE A DAY Patient taking differently: Take 5 mg by mouth 2 (two) times daily. 11/21/21  Yes Croitoru, Mihai, MD  folic acid (FOLVITE) 1 MG tablet Take 1 mg by mouth every morning.    Yes  [provider]  furosemide (LASIX) 40 MG tablet Take 1 tablet (40 mg total) by mouth daily. 01/28/22  Yes Ghimire, Henreitta Leber, MD  ipratropium-albuterol (DUONEB) 0.5-2.5 (3) MG/3ML SOLN Take 3 mLs by nebulization every 6 (six) hours as needed. 01/27/22  Yes Ghimire, Henreitta Leber, MD  lactulose (CHRONULAC) 10 GM/15ML solution Take 30 mLs (20 g total) by mouth 2 (two) times daily. Patient taking differently: Take 20 g by mouth daily as needed for moderate constipation. 01/08/22  Yes Barb Merino, MD  lansoprazole (PREVACID) 30 MG capsule Take 30 mg by mouth every morning.   Yes [provider]  methocarbamol (ROBAXIN) 500 MG tablet Take 1 tablet (500 mg total) by mouth every 6 (six) hours as needed for muscle spasms. 01/06/22  Yes Meredith Pel, MD  methotrexate 2.5 MG tablet Take 15 mg by mouth every Monday. Take 5 tablets  Caution: Chemotherapy. Protect from light.   Yes [provider]  Multiple Vitamins-Minerals (CENTRUM SILVER ADULT 50+) TABS Take 1 tablet by mouth daily.   Yes [provider]  oxyCODONE (OXY IR/ROXICODONE) 5 MG immediate release tablet Take 1 tablet (5 mg total) by mouth daily as needed for severe pain. 03/20/22  Yes Magnant, Charles L, PA-C  potassium chloride SA (KLOR-CON M) 20 MEQ tablet Take 1 tablet (20 mEq total) by mouth daily. 01/27/22  Yes Ghimire, Henreitta Leber, MD  tiotropium (SPIRIVA HANDIHALER) 18 MCG inhalation capsule Place 1 capsule (18 mcg total) into inhaler and inhale daily. 01/27/22 01/27/23 Yes Ghimire, Henreitta Leber, MD  albuterol (PROVENTIL) (2.5 MG/3ML) 0.083% nebulizer solution Take 3 mLs (2.5 mg total) by nebulization every 6 (six) hours as needed for wheezing or shortness of breath. 02/28/22   Collene Gobble, MD     [facility] 06/15/2022 11:07 AM

## 2022-06-15 NOTE — Progress Notes (Addendum)
Rounding Note    Patient Name: Jeffrey Stephens Date of Encounter: 06/15/2022  [place] Cardiologist: Sanda Klein, MD   Subjective   O2 required had to be increased from 6 L to 14 L overnight. Patient is currently on 5L this morning and O2 sats 83%. He denies any real improvement or worsening in shortness of breath. No chest pain or palpitations. He remains in atrial flutter with rates in the 90s to low 100s. He remains significantly volume overloaded with his RV failure. His daughter who used to be a nurse on [facility] states his upper extremities have been weeping. She states he had lower extremity edema prior to admission but his upper extremity edema edema is new.  Inpatient Medications    Scheduled Meds:  apixaban  5 mg Oral BID   arformoterol  15 mcg Nebulization BID   budesonide (PULMICORT) nebulizer solution  0.5 mg Nebulization BID   diltiazem  240 mg Oral Daily   dofetilide  250 mcg Oral BID   feeding supplement  237 mL Oral BID BM   folic acid  1 mg Oral q morning   furosemide  80 mg Intravenous BID   magnesium oxide  400 mg Oral Daily   multivitamin with minerals  1 tablet Oral Daily   pantoprazole  20 mg Oral Daily   potassium chloride  20 mEq Oral BID   revefenacin  175 mcg Nebulization Daily   sodium chloride flush  3 mL Intravenous Q12H   sodium chloride flush  3 mL Intravenous Q12H   sodium chloride flush  3 mL Intravenous Q12H   Continuous Infusions:  sodium chloride     sodium chloride     PRN Meds: sodium chloride, sodium chloride, acetaminophen **OR** acetaminophen, acetaminophen, albuterol, diltiazem, guaiFENesin-dextromethorphan, ipratropium-albuterol, sodium chloride flush, sodium chloride flush   Vital Signs    Vitals:   06/14/22 2326 06/15/22 0347 06/15/22 0503 06/15/22 0804  BP: (!) 104/57 (!) 101/55 (!) 84/59 106/67  Pulse: (!) 58 75  (!) 48  Resp: 20 20 20 20   Temp: 97.7 F (36.5 C) 98.2 F (36.8 C) 98.3 F (36.8 C) 98.6 F (37  C)  TempSrc: Oral Oral Oral Oral  SpO2: 96% 91%  94%  Weight:  112.5 kg    Height:        Intake/Output Summary (Last 24 hours) at 06/15/2022 0843 Last data filed at 06/15/2022 0348 Gross per 24 hour  Intake 708 ml  Output 1600 ml  Net -892 ml      06/15/2022    3:47 AM 06/14/2022    4:46 AM 06/13/2022    5:00 AM  Last 3 Weights  Weight (lbs) 248 lb 0.3 oz 247 lb 9.2 oz 246 lb 14.6 oz  Weight (kg) 112.5 kg 112.3 kg 112 kg      Telemetry    Atrial flutter with rates in the 60s to 80s overnight but 90s to low 100s this morning. Occasionally as high as the 120s. - Personally Reviewed  ECG    Atrial flutter, rate 70 bpm, with variable AV block.  - Personally Reviewed  Physical Exam   GEN: Obese Caucasian male in no acute distress.   Neck: JVD elevated. Cardiac: Tachycardic with irregular rhythm. No murmurs, rubs, or gallops.  Respiratory: On 10L of HFNC. No significant increased work of breathing. Course crackles noted in bilateral bases (right > left) that sound more consistent with fibrosis than edema. GI: Soft and distended.  MS: 3+  pitting edema of bilateral lower extremities. Mostly non-pitting edema of bilateral upper extremities as well. No deformity. Skin: Warm and dry. Neuro:  No focal deficits. Psych: Normal affect. Responds appropriately.  Labs    High Sensitivity Troponin:   Recent Labs  Lab 06/10/22 1836 06/10/22 2037 06/11/22 0116 06/11/22 0429  TROPONINIHS 14 16 16 15      Chemistry Recent Labs  Lab 06/10/22 1836 06/10/22 1855 06/12/22 0244 06/12/22 1633 06/12/22 1906 06/13/22 0622 06/14/22 0423  NA 140   < > 139  --  135 139 138  K 3.8   < > 3.9  --  3.9 4.7 4.4  CL 99   < > 101  --  100 102 99  CO2 28   < > 31  --  30 31 28   GLUCOSE 109*   < > 108*  --  143* 92 93  BUN 13   < > 14  --  14 15 15   CREATININE 1.53*   < > 1.71*  --  1.54* 1.64* 1.63*  CALCIUM 8.9   < > 8.8*  --  8.5* 9.0 9.1  MG  --   --   --  2.0  --  2.3 2.1  PROT 6.3*   --  5.4*  --   --   --   --   ALBUMIN 2.9*  --  2.5*  --   --   --   --   AST 20  --  21  --   --   --   --   ALT 12  --  9  --   --   --   --   ALKPHOS 38  --  35*  --   --   --   --   BILITOT 1.5*  --  0.9  --   --   --   --   GFRNONAA 46*   < > 40*  --  46* 42* 43*  ANIONGAP 13   < > 7  --  5 6 11    < > = values in this interval not displayed.    Lipids No results for input(s): "CHOL", "TRIG", "HDL", "LABVLDL", "LDLCALC", "CHOLHDL" in the last 168 hours.  Hematology Recent Labs  Lab 06/10/22 1836 06/10/22 1855 06/12/22 0244 06/14/22 0423 06/14/22 1854  WBC 8.9  --  9.2 12.7*  --   RBC 3.85*  --  3.45* 3.39*  --   HGB 10.2*   < > 9.1* 9.1* 9.0*  HCT 34.7*   < > 31.3* 30.5* 30.3*  MCV 90.1  --  90.7 90.0  --   MCH 26.5  --  26.4 26.8  --   MCHC 29.4*  --  29.1* 29.8*  --   RDW 24.2*  --  24.4* 25.0*  --   PLT 331  --  278 224  --    < > = values in this interval not displayed.   Thyroid  Recent Labs  Lab 06/11/22 0116  TSH 2.479    BNP Recent Labs  Lab 06/10/22 1837  BNP 224.2*    DDimer No results for input(s): "DDIMER" in the last 168 hours.   Radiology    DG CHEST PORT 1 VIEW  Result Date: 06/15/2022 CLINICAL DATA:  Chronic respiratory failure EXAM: PORTABLE CHEST 1 VIEW COMPARISON:  06/10/2022 FINDINGS: Mild cardiomegaly is noted but stable. Aortic calcifications are seen. Interstitial fibrotic changes are noted stable from the prior exam. No acute  infiltrate is seen. No bony abnormality is noted. New loop recorder is noted. IMPRESSION: Chronic fibrotic changes without acute abnormality. Electronically Signed   By: Inez Catalina M.D.   On: 06/15/2022 01:49   ECHOCARDIOGRAM LIMITED BUBBLE STUDY  Result Date: 06/14/2022    ECHOCARDIOGRAM LIMITED REPORT   Patient Name:   Jeffrey Stephens Date of Exam: 06/13/2022 Medical Rec #:  [id number]       Height:       69.0 in Accession #:    [id number]      Weight:       246.9 lb Date of Birth:  June 25, 1941      BSA:           2.260 m Patient Age:    81 years        BP:           110/57 mmHg Patient Gender: M               HR:           63 bpm. Exam Location:  Inpatient Procedure: Limited Echo Indications:    syncope  History:        Patient has prior history of Echocardiogram examinations, most                 recent 06/12/2022.  Sonographer:    Johny Chess RDCS Referring Phys: [id number] Cheryle Horsfall DEWALD IMPRESSIONS  1. Agitated saline contrast bubble study was negative, with no evidence of any interatrial shunt. FINDINGS  IAS/Shunts: Agitated saline contrast was given intravenously to evaluate for intracardiac shunting. Agitated saline contrast bubble study was negative, with no evidence of any interatrial shunt. Candee Furbish MD Electronically signed by Candee Furbish MD Signature Date/Time: 06/14/2022/6:30:17 AM    Final    US Abdomen Limited RUQ (LIVER/GB)  Result Date: 06/13/2022 CLINICAL DATA:  Liver disease EXAM: ULTRASOUND ABDOMEN LIMITED RIGHT UPPER QUADRANT COMPARISON:  CT abdomen pelvis 01/26/2016 FINDINGS: Gallbladder: Gallstones: None Sludge: None Gallbladder Wall: Mildly thickened measuring up to 4 mm likely due to a combination of underdistention and hepatitis. Pericholecystic fluid: None Sonographic Murphy's Sign: Positive per technologist Common bile duct: Diameter: 8 mm.  Within normal limits for patient's age. Liver: Parenchymal echogenicity: Within normal limits Contours: Nodular Lesions: None Portal vein: Patent.  Hepatopetal flow Other: None. IMPRESSION: 1. Nodular hepatic contours, suspicious for cirrhosis. No discrete hepatic mass. 2. Mild thickening of the gallbladder wall likely due to under distension and hepatic inflammation. Electronically Signed   By: Miachel Roux M.D.   On: 06/13/2022 09:19    Cardiac Studies   Carotid Ultrasound 06/11/2022: Summary: - Right Carotid: Velocities in the right ICA are consistent with a 40-59% stenosis. The CCA & ECA were near-normal with only minimal wall thickening or  plaque.  - Left Carotid: The extracranial vessels were near-normal with only minimal  wall thickening or plaque.  - Vertebrals:  Bilateral vertebral arteries demonstrate antegrade flow.  - Subclavians: Normal flow hemodynamics were seen in bilateral subclavian arteries.  _______________   Echocardiogram 06/12/2022: Impression:  1. Left ventricular ejection fraction, by estimation, is 60 to 65%. The  left ventricle has normal function. The left ventricle has no regional  wall motion abnormalities. Left ventricular diastolic function could not  be evaluated.   2. Right ventricular systolic function is moderately reduced. The right  ventricular size is mildly enlarged.   3. The mitral valve is normal in structure. Trivial mitral valve  regurgitation. No evidence of  mitral stenosis.   4. The aortic valve is tricuspid. Aortic valve regurgitation is not  visualized. Aortic valve sclerosis is present, with no evidence of aortic  valve stenosis. Aortic valve area, by VTI measures 2.79 cm. Aortic valve  mean gradient measures 3.0 mmHg.  Aortic valve Vmax measures 1.09 m/s.   5. There is borderline dilatation of the aortic root, measuring 37 mm.  There is mild dilatation of the ascending aorta, measuring 39 mm. _______________   Loop Recorder 06/12/2022: Successful loop recorder implantation. _______________   Limited Echocardiogram with Bubble Study 06/13/2022: Impressions:  1. Agitated saline contrast bubble study was negative, with no evidence  of any interatrial shunt.   Patient Profile     81 y.o. male  with a history of coronary calcification noted on CTs dating back to at least 2010, atrial flutter on Eliquis, abdominal aortic aneurysm followed by Dr. Scot Dock, cerebral aneurysm complicated by secondary hemorrhagic stroke s/p clipping in 1994, borderline dilation of the ascending aorta, orthostatic hypotension, remote DVT, COPD on home O2, and rheumatoid arthritis who was admitted on  06/10/2022 with syncope.  Assessment & Plan    Syncope S/p Loop Recorder Patient was admitted following episode of syncope/unresponsiveness while seated at dinner table. Family attempted CPR in chair due to no evidence of breathing. Upon EMS arrival, O2 sat 76% and he was placed on a non-rebreather mask and gradually became more responsive. He ultimately required BIPAP. Syncope suspected to be hypoxia related although arrhythmia cannot be ruled out. He also has a history of orthostatic hypotension but was not changing position at time of event. He also a history of persistent atrial flutter and severe bradycardia on Sotalol which led to discontinuation of this. He likely has some sinus dysfunction. Loop recorder was placed on 06/13/2022 to help monitor for any break through tachyarrhythmia or post-conversion pauses/ bradycardia that could have caused syncope.    Persistent Atrial Flutter  Looks like he has been in persistent atrial flutter since 12/2021. He was started on Tikosyn on 06/12/2022. QTc 378 ms on EKG last night after 3rd dose.  - Rates in the 90s to low 100s this morning but as high as the 120s at times. - Potassium 4.4 and Magnesium 2.1 yesterday. Labs pending today. - Continue Tikosyn 250mg  twice daily.  - Currently on Cardizem 240mg  daily. Also on short acting Cardizem 30mg  every 8 hours as needed for heart rates sustaining in the 120s. - Continue chronic anticoagulation with Eliquis 5mg  twice daily. - He is on the board for DCCV tomorrow but am worried he may not tolerate this with increased O2 requirement.   Right Sided CHF Cor Pulmonale BNP mildly elevated at 224. Echo showed LVEF of 60-65% with no regional wall motion abnormalities and mildly enlarged RV with moderate reduced systolic function. Started on IV Lasix - this was increased to 80mg  twice daily yesterday.. Documented urinary output of 1.8 L yesterday and net negative 2.3 L so far this admission. Today's BMET pending. -  Still appears volume overloaded on exam. - Continue IV Lasix 80mg  twice daily. May need to adjust this but will wait for today's labs to come back first. - Patient has underlying COPD and was well compensated until severe respiratory illness about 5 months ago and has been on O2 ever since. He had normal RV function in 12/2021. Overall impression suggest this severe illness led to marked change in pulmonary function and cor pulmonale since then.  - May need to consider right heart  cath at some point.   Acute Hypoxic Respiratory Failure COPD History of Lung Cancer s/p Lobectomy in 2017 He has been on 4-5L of O2 at home since severe respiratory illness in April/May 2023. CT showed bilateral ground glass pulmonary infiltrates - consider inflammatory vs pulmonary edema. Echo with bubble study was negative for shunt. Pulmonology consulted this admission and also had concern for possible autoimmune mediate pneumonitis given history of rheumatoid arthritis and being on Methotrexate.  - Patient required increase in O2 requirement from 6L to 14L overnight. Repeat chest x-ray this morning showed chronic fibrotic changes without any acute findings. O2 sats were 83% while on 5L while I was in the room. I increased him to 10 L with improvement in O2 sats to 92-93%. - See recommendations for CHF above. - Outpatient sleep study has been arranged and Pulmonology will arrange. - He clearly has evidence of RV failure but I suspect persistent hypoxia is due to underlying pulmonary condition. Further recommendations per primary team and Pulmonology.    Orthostatic Hypotension History of orthostatic hypotension. BP currently stable. - Continue to monitor closely.   AKI Baseline creatinine around 0.9. Creatinine 1.50 on admission and peaked at 1.71 on 9/18. - Creatinine stable at 1.63 today. - Today's labs pending.  Chronic Anemia Hemoglobin 9.1 yesterday, down from 10.2 on admission. - Hemoccult positive  today. - Management per primary team.  For questions or updates, please contact [place] Please consult [email or web address] for contact info under        Signed, Darreld Mclean, PA-C  06/15/2022, 8:43 AM    I have seen and examined the patient along with Darreld Mclean, PA-C .  I have reviewed the chart, notes and new data.  I agree with PA/NP's note.  Key new complaints: Remains extremely short of breath with minimal activity and dependent on high flow oxygen.  Oxygen requirements have increased overnight.  When he falls asleep he breathes through his mouth and his oxygen levels promptly decreased to the 80s, while breathing through his nose on nasal cannula 8 L his oxygen saturation is 93-94%. Key examination changes: Obese, generalized edema, irregular rhythm Key new findings / data: Remains in atrial flutter.  Ventricular rate was well controlled overnight in the 70s, while awake in the 100-120s.  QTc 416 ms.  Creatinine marginally higher at 1.72, normal BUN and potassium.  PLAN: Scheduled for DC cardioversion tomorrow afternoon.  N.p.o. past midnight. Although I think it is worth a try, I am skeptical that returning him to sinus rhythm will make a big difference in his hypoxia and overall respiratory issues.  Would like to ask pulmonary to see if there is any other interventions that might be beneficial.  If not, he will need palliative care, probably even home hospice.  Sanda Klein, MD, [place] [phone] 06/15/2022, 11:28 AM

## 2022-06-16 ENCOUNTER — Encounter (HOSPITAL_COMMUNITY)
Admission: EM | Disposition: A | Discharge status: Hospice | Payer: Self-pay | Source: Home / Self Care | Attending: Internal Medicine

## 2022-06-16 DIAGNOSIS — R55 Syncope and collapse: Secondary | ICD-10-CM | POA: Diagnosis not present

## 2022-06-16 DIAGNOSIS — I50812 Chronic right heart failure: Secondary | ICD-10-CM | POA: Diagnosis not present

## 2022-06-16 DIAGNOSIS — Z7189 Other specified counseling: Secondary | ICD-10-CM | POA: Diagnosis not present

## 2022-06-16 DIAGNOSIS — Z515 Encounter for palliative care: Secondary | ICD-10-CM | POA: Diagnosis not present

## 2022-06-16 DIAGNOSIS — J9621 Acute and chronic respiratory failure with hypoxia: Secondary | ICD-10-CM | POA: Diagnosis not present

## 2022-06-16 DIAGNOSIS — I483 Typical atrial flutter: Secondary | ICD-10-CM | POA: Diagnosis not present

## 2022-06-16 LAB — BASIC METABOLIC PANEL
Anion gap: 10 (ref 5–15)
BUN: 25 mg/dL — ABNORMAL HIGH (ref 8–23)
CO2: 26 mmol/L (ref 22–32)
Calcium: 9 mg/dL (ref 8.9–10.3)
Chloride: 99 mmol/L (ref 98–111)
Creatinine, Ser: 1.78 mg/dL — ABNORMAL HIGH (ref 0.61–1.24)
GFR, Estimated: 38 mL/min — ABNORMAL LOW (ref >60)
Glucose, Bld: 161 mg/dL — ABNORMAL HIGH (ref 70–99)
Potassium: 5.1 mmol/L (ref 3.5–5.1)
Sodium: 135 mmol/L (ref 135–145)

## 2022-06-16 LAB — CBC WITH DIFFERENTIAL/PLATELET
Abs Immature Granulocytes: 0.13 10*3/uL — ABNORMAL HIGH (ref 0.00–0.07)
Basophils Absolute: 0 10*3/uL (ref 0.0–0.1)
Basophils Relative: 0 %
Eosinophils Absolute: 0 10*3/uL (ref 0.0–0.5)
Eosinophils Relative: 0 %
HCT: 29 % — ABNORMAL LOW (ref 39.0–52.0)
Hemoglobin: 8.5 g/dL — ABNORMAL LOW (ref 13.0–17.0)
Immature Granulocytes: 1 %
Lymphocytes Relative: 3 %
Lymphs Abs: 0.4 10*3/uL — ABNORMAL LOW (ref 0.7–4.0)
MCH: 26.1 pg (ref 26.0–34.0)
MCHC: 29.3 g/dL — ABNORMAL LOW (ref 30.0–36.0)
MCV: 89 fL (ref 80.0–100.0)
Monocytes Absolute: 0.3 10*3/uL (ref 0.1–1.0)
Monocytes Relative: 2 %
Neutro Abs: 16.2 10*3/uL — ABNORMAL HIGH (ref 1.7–7.7)
Neutrophils Relative %: 94 %
Platelets: 225 10*3/uL (ref 150–400)
RBC: 3.26 MIL/uL — ABNORMAL LOW (ref 4.22–5.81)
RDW: 24.3 % — ABNORMAL HIGH (ref 11.5–15.5)
WBC: 17.1 10*3/uL — ABNORMAL HIGH (ref 4.0–10.5)
nRBC: 0.1 % (ref 0.0–0.2)

## 2022-06-16 LAB — HEPATIC FUNCTION PANEL
ALT: 8 U/L (ref 0–44)
AST: 33 U/L (ref 15–41)
Albumin: 2.7 g/dL — ABNORMAL LOW (ref 3.5–5.0)
Alkaline Phosphatase: 38 U/L (ref 38–126)
Bilirubin, Direct: 0.1 mg/dL (ref 0.0–0.2)
Indirect Bilirubin: 0.4 mg/dL (ref 0.3–0.9)
Total Bilirubin: 0.5 mg/dL (ref 0.3–1.2)
Total Protein: 5.8 g/dL — ABNORMAL LOW (ref 6.5–8.1)

## 2022-06-16 LAB — MAGNESIUM: Magnesium: 2.2 mg/dL (ref 1.7–2.4)

## 2022-06-16 LAB — GLUCOSE, CAPILLARY
Glucose-Capillary: 137 mg/dL — ABNORMAL HIGH (ref 70–99)
Glucose-Capillary: 131 mg/dL — ABNORMAL HIGH (ref 70–99)
Glucose-Capillary: 146 mg/dL — ABNORMAL HIGH (ref 70–99)
Glucose-Capillary: 144 mg/dL — ABNORMAL HIGH (ref 70–99)

## 2022-06-16 LAB — PROTIME-INR
INR: 2.5 — ABNORMAL HIGH (ref 0.8–1.2)
Prothrombin Time: 26.8 seconds — ABNORMAL HIGH (ref 11.4–15.2)

## 2022-06-16 SURGERY — CARDIOVERSION
Anesthesia: General

## 2022-06-16 MED ORDER — SODIUM CHLORIDE 0.9 % IV SOLN: INTRAVENOUS | Status: DC

## 2022-06-16 MED ORDER — INSULIN ASPART 100 UNIT/ML IJ SOLN
0.0000 [IU] | Freq: Three times a day (TID) | INTRAMUSCULAR | Status: DC
  Administered 2022-06-17: 1 [IU] via SUBCUTANEOUS
  Administered 2022-06-17: 3 [IU] via SUBCUTANEOUS
  Administered 2022-06-18: 2 [IU] via SUBCUTANEOUS
  Administered 2022-06-18: 1 [IU] via SUBCUTANEOUS
  Administered 2022-06-18: 2 [IU] via SUBCUTANEOUS

## 2022-06-16 MED ORDER — INSULIN ASPART 100 UNIT/ML IJ SOLN: 0.0000 [IU] | Freq: Three times a day (TID) | INTRAMUSCULAR | Status: DC

## 2022-06-16 MED ORDER — DILTIAZEM HCL ER COATED BEADS 180 MG PO CP24
360.0000 mg | ORAL_CAPSULE | Freq: Every day | ORAL | Status: DC
  Administered 2022-06-17 – 2022-06-19 (×3): 360 mg via ORAL
  Filled 2022-06-16 (×3): qty 2

## 2022-06-16 MED ORDER — INSULIN ASPART 100 UNIT/ML IJ SOLN: 0.0000 [IU] | Freq: Every day | INTRAMUSCULAR | Status: DC

## 2022-06-16 MED ORDER — DILTIAZEM HCL ER COATED BEADS 120 MG PO CP24
120.0000 mg | ORAL_CAPSULE | Freq: Once | ORAL | Status: AC
  Administered 2022-06-16: 120 mg via ORAL
  Filled 2022-06-16: qty 1

## 2022-06-16 NOTE — Plan of Care (Signed)
  Problem: Clinical Measurements: Goal: Respiratory complications will improve Outcome: Progressing Goal: Cardiovascular complication will be avoided Outcome: Progressing   Problem: Nutrition: Goal: Adequate nutrition will be maintained Outcome: Progressing   Problem: Coping: Goal: Level of anxiety will decrease Outcome: Progressing   Problem: Elimination: Goal: Will not experience complications related to bowel motility Outcome: Progressing Goal: Will not experience complications related to urinary retention Outcome: Progressing   Problem: Pain Managment: Goal: General experience of comfort will improve Outcome: Progressing   Problem: Safety: Goal: Ability to remain free from injury will improve Outcome: Progressing   Problem: Skin Integrity: Goal: Risk for impaired skin integrity will decrease Outcome: Not Progressing

## 2022-06-16 NOTE — Progress Notes (Signed)
Occupational Therapy Treatment Patient Details Name: Jeffrey Stephens MRN: [id number] DOB: 1941/02/23 Today's Date: 06/16/2022   History of present illness 81 year old male with medical history of atrial flutter on Eliquis, VTE, lung cancer s/p lobectomy in 2017, rheumatoid arthritis on methotrexate, COPD on home O2 4 L/min presented to ED with complaints of syncope with prolonged unresponsiveness.  On EMS arrival patient was noted to be hypoxemic with O2 sats in 70s on oxygen on 4 L/min via nasal cannula.  Patient had 3 episodes of syncope in past 3 weeks.   OT comments  Pt received in bed, agreeable to OT session. Pt required modA for bed mobility for BLE management, pt was able to manage trunk progression. He required 10 min rest break while sitting EOB to return SpO2 >90% with cues for pursed lip breathing. Pt stood 1x from elevated surface and required modA to powerup, he took 4 lateral side steps before returning to sit EOB. Pt desated to 83% and required 6L via HFNC and 2L via venturi mask (due to mouth breathing and slow O2 return). Once recovered, pt tolerated sitting EOB with 4lnc for 74min. Discussed with RN. Educated pt on activity progression, importance of mobility, pursed lip breathing and diaphragmatic breathing. Will continue to follow acutely.    Recommendations for follow up therapy are one component of a multi-disciplinary discharge planning process, led by the attending physician.  Recommendations may be updated based on patient status, additional functional criteria and insurance authorization.    Follow Up Recommendations  Skilled nursing-short term rehab (<3 hours/day)    Assistance Recommended at Discharge Frequent or constant Supervision/Assistance  Patient can return home with the following  A lot of help with walking and/or transfers;A lot of help with bathing/dressing/bathroom;Assistance with cooking/housework;Direct supervision/assist for medications management;Direct  supervision/assist for financial management;Help with stairs or ramp for entrance;Assist for transportation   Equipment Recommendations  None recommended by OT (Defer)    Recommendations for Other Services PT consult    Precautions / Restrictions Precautions Precautions: Fall;Other (comment) Precaution Comments: monitor orthostatics, HR, and O2 sats Restrictions Weight Bearing Restrictions: No       Mobility Bed Mobility Overal bed mobility: Needs Assistance Bed Mobility: Supine to Sit, Sit to Supine     Supine to sit: Mod assist Sit to supine: Mod assist   General bed mobility comments: assistance with RLE due to limited mobility 2/2 previous hip sx    Transfers Overall transfer level: Needs assistance Equipment used: Rolling walker (2 wheels) Transfers: Sit to/from Stand Sit to Stand: Mod assist, From elevated surface     Step pivot transfers: Min assist     General transfer comment: Assist to bring hips up. stability with lateral side steps along EOB     Balance Overall balance assessment: Needs assistance Sitting-balance support: No upper extremity supported, Feet supported Sitting balance-Leahy Scale: Fair     Standing balance support: Bilateral upper extremity supported, During functional activity Standing balance-Leahy Scale: Poor Standing balance comment: heavy reliance on BUE support on RW                           ADL either performed or assessed with clinical judgement   ADL Overall ADL's : Needs assistance/impaired Eating/Feeding: Set up;Sitting   Grooming: Set up;Sitting Grooming Details (indicate cue type and reason): sitting EOB             Lower Body Dressing: Maximal assistance Lower Body Dressing Details (indicate  cue type and reason): maxA to don socks             Functional mobility during ADLs: Maximal assistance;+2 for physical assistance General ADL Comments: pt limited by cardiopulmonary limitations     Extremity/Trunk Assessment Upper Extremity Assessment Upper Extremity Assessment: Generalized weakness   Lower Extremity Assessment Lower Extremity Assessment: Defer to PT evaluation;Generalized weakness        Vision   Vision Assessment?: No apparent visual deficits   Perception     Praxis      Cognition Arousal/Alertness: Awake/alert Behavior During Therapy: WFL for tasks assessed/performed Overall Cognitive Status: Within Functional Limits for tasks assessed                                          Exercises Exercises: General Upper Extremity, General Lower Extremity General Exercises - Upper Extremity Shoulder Flexion: AAROM, 10 reps, Supine General Exercises - Lower Extremity Heel Slides: AAROM, Both, 10 reps, Supine Hip ABduction/ADduction: AAROM, Both, 10 reps, Supine Straight Leg Raises: AAROM, Both, 5 reps, Supine    Shoulder Instructions       General Comments Pt on 4lnc at rest, SpO2 89%-93%, with exertion pt required 6lnc and extended rest breaks with pursed lip breathing to rebound SpO2 >88%    Pertinent Vitals/ Pain       Pain Assessment Pain Assessment: No/denies pain  Home Living                                          Prior Functioning/Environment              Frequency  Min 2X/week        Progress Toward Goals  OT Goals(current goals can now be found in the care plan section)  Progress towards OT goals: Progressing toward goals  Acute Rehab OT Goals Patient Stated Goal: to get stronger and return home OT Goal Formulation: With patient Time For Goal Achievement: 06/27/22 Potential to Achieve Goals: Fair ADL Goals Pt Will Perform Grooming: sitting;with supervision Pt Will Perform Upper Body Dressing: with min guard assist;sitting Additional ADL Goal #1: pt will be able to sit EOB for ~5 min for functional task in order to improve activity tolerance for ADLs  Plan Discharge plan remains  appropriate    Co-evaluation                 AM-PAC OT "6 Clicks" Daily Activity     Outcome Measure   Help from another person eating meals?: A Little Help from another person taking care of personal grooming?: A Little Help from another person toileting, which includes using toliet, bedpan, or urinal?: A Lot Help from another person bathing (including washing, rinsing, drying)?: A Lot Help from another person to put on and taking off regular upper body clothing?: A Lot Help from another person to put on and taking off regular lower body clothing?: Total 6 Click Score: 13    End of Session Equipment Utilized During Treatment: Oxygen;Rolling walker (2 wheels)  OT Visit Diagnosis: Unsteadiness on feet (R26.81);Other abnormalities of gait and mobility (R26.89);Muscle weakness (generalized) (M62.81)   Activity Tolerance Patient tolerated treatment well   Patient Left in bed;with call bell/phone within reach;with bed alarm set;with family/visitor present   Nurse Communication Mobility status  Time: 4496-7591 OT Time Calculation (min): 46 min  Charges: OT General Charges $OT Visit: 1 Visit OT Treatments $Self Care/Home Management : 38-52 mins  [facility] OTR/L Acute Rehabilitation Services Office: [place] 06/16/2022, 2:48 PM

## 2022-06-16 NOTE — Plan of Care (Signed)
  Problem: Clinical Measurements: Goal: Respiratory complications will improve Outcome: Progressing   Problem: Clinical Measurements: Goal: Cardiovascular complication will be avoided Outcome: Progressing   Problem: Activity: Goal: Risk for activity intolerance will decrease Outcome: Progressing   Problem: Nutrition: Goal: Adequate nutrition will be maintained Outcome: Progressing   Problem: Pain Managment: Goal: General experience of comfort will improve Outcome: Progressing   Problem: Safety: Goal: Ability to remain free from injury will improve Outcome: Progressing   Problem: Skin Integrity: Goal: Risk for impaired skin integrity will decrease Outcome: Progressing   Problem: Health Behavior/Discharge Planning: Goal: Ability to identify and utilize available resources and services will improve Outcome: Progressing Goal: Ability to manage health-related needs will improve Outcome: Progressing

## 2022-06-16 NOTE — Consult Note (Signed)
Palliative Medicine Inpatient Consult Note  Consulting Provider: Garner Nash, DO  Reason for consult:   [facility] Consult  Reason for Consult? respiratory failure, copd, RA   06/16/2022  HPI:  Per Pulmonology Note--> 81 year old male with prior history significant for but not limited to COPD, chronic hypoxic respiratory failure on 4-5L HOT, lung ca s/p RLL lobectomy 2017, atrial flutter and hx of VTE on Eliquis, RA on methotrexate who presented from home after syncopal episode with prolonged altered mental status at home on 9/16. Palliative care has been asked to get involved to further address goals of care in the setting of multiple co-morbid conditions.   Clinical Assessment/Goals of Care:  *Please note that this is a verbal dictation therefore any spelling or grammatical errors are due to the "[place] [employer]" system interpretation.  I have reviewed medical records including EPIC notes, labs and imaging, received report from bedside RN, assessed the patient.    I met with Jeffrey Stephens and family (Daughter - Jeffrey Stephens, Wife- Jeffrey Stephens, Granddaughter - Jeffrey Stephens)  to further discuss diagnosis prognosis, GOC, EOL wishes, disposition and options.   I introduced Palliative Medicine as specialized medical care for people living with serious illness. It focuses on providing relief from the symptoms and stress of a serious illness. The goal is to improve quality of life for both the patient and the family.  Medical History Review and Understanding: Jeffrey Stephens is aware that he was admitted to the hospital due to his heart not being in a normal rhythm causing him to not be responsive and needing CPR.  He is aware that he is supposed to go for a cardioversion today.  Jeffrey Stephens explains that he knows about his lung disease, and knows that this is not going to get better.  From Elkin's understanding he thinks the cardioversion would help him feel better.   Social  History: Jeffrey Stephens was born and raised in [place]. 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 He enjoys most of his day sitting at home watching westerns on the television.  He states that he has 3 children, 9 grandchildren and 10 great-grandchildren. He has a strong religious faith.   Functional and Nutritional State: Jeffrey Stephens states he generally uses a walker when he ambulates around the house however given his lung disease he has a hard time breathing requiring 4 to 5 L of oxygen at baseline.  He states that for the past couple weeks he has not been able to move around the house much at all.  Per her daughter she believes that his breathing has gotten worse over the last couple weeks.  Jeffrey Stephens denies any changes in his appetite.  Palliative Symptoms: Shortness of breath  Advance Directives: A detailed discussion was had today regarding advanced directives. Patient does not have these though they have been provided to patient to complete.    Code Status: Full code Concepts specific to code status, artifical feeding and hydration, continued IV antibiotics and rehospitalization was had.  The difference between a aggressive medical intervention path  and a palliative comfort care path for this patient at this time was had.   Encouraged patient/family to consider DNR/DNI status understanding evidenced based poor outcomes in similar hospitalized patient, as the cause of arrest is likely associated with advanced chronic/terminal illness rather than an easily reversible acute cardio-pulmonary event. I explained that DNR/DNI does not change the medical plan and it only comes into effect after a person  has arrested (died).  It is a protective measure to keep [place] from harming the patient in their last moments of life. Jeffrey Stephens vocalized having a tough time considering a DNAR as he shares, "not wanting to die."   Discussion: We met with the patient and his family today at the bedside to discuss goals of  care.  Daughter-Jeffrey Stephens expressed that she had a conversation with Jeffrey Stephens yesterday in which he shared his concerns with the progression of Jeffrey Stephens's lung disease.  She states that Dr. Harlow Stephens recommend having a goals of care meeting today to discuss the risk of possible intubation during the cardioversion.  The daughter expresses that neither her or any of the family members want to be the ones reliable for making the decision to " end his life", in the event he cannot make his own decisions.  His wife expresses that that decision would be hard for her to make, and would like for Jeffrey Stephens to be able to have that decision made before he cannot make for himself.  Jeffrey Stephens states that he would be okay being on a ventilator for 5 to 10 days if needed.  I explained to him given that his advanced stage of lung disease that he may not be able to come off the ventilator once intubated.  He he states he understands that his lung disease is not something that is curable however if there is a chance for him to live he would like to do that.  His daughter expressed that she was informed that even if the cardioversion is successful, it would not benefit his lung disease which seems to be more progressive.    I asked Jeffrey Stephens in the event that his heart stopped would he want CPR, he stated he would not want CPR.  At that time we discussed changing CODE STATUS to DNR, however Jeffrey Stephens was still adamant about being intubated in the event that he needs extra support.  I explained to him that intubation is not a part of the DNR CODE STATUS, so at this time he will remain a full code as Jeffrey Stephens states that he needs more time to think about what he would like done.  I believe Jeffrey Stephens is conflicted on wanting to make a decision between life and death, however, I believe there is a gap in education between the risk for proceeding with cardioversion and his pulmonary disease.  I expressed to Jeffrey Stephens that he does not need to make a decision right now as it  appears that he would like further education on his baseline prognosis versus prognosis with the procedure.   Discussed the importance of continued conversation with family and their  medical providers regarding overall plan of care and treatment options, ensuring decisions are within the context of the patients values and GOCs.  I also introduced the importance of establishing a living will and provided these resources resources to his wife.   Provided [employer]" booklet.   Decision Maker: Hillel can presently make decisions for himself though if unable to do so would rely on his spouse, Sheppard Coil (Spouse)  [phone] (Mobile)  SUMMARY OF RECOMMENDATIONS   -Full code at this time --> patient needs some grace to consider his options -Continue current treatment plan -Appreciate follow-up with pulmonology and cardiology in regards to prognostics -PMT will continue to follow  Code Status/Advance Care Planning: FULL CODE  Symptom Management:  -Per primary team  Palliative Prophylaxis:  Aspiration, Bowel Regimen, Delirium Protocol,  Frequent Pain Assessment, Oral Care, Palliative Wound Care, and Turn Reposition  Additional Recommendations (Limitations, Scope, Preferences): Continue current care  Psycho-social/Spiritual:  Desire for further Chaplaincy support: Patient has a strong faith Additional Recommendations: Education on cardiopulmonary resuscitation status   Prognosis: Very limited in the setting of advance lung disease (+) dyspnea at rest, (+) O2 < 88% on RA, (+) recurrent hospitalizations. < 6 months  Discharge Planning: Patient would like to go home on discharge  Review of Systems  Respiratory:  Positive for sputum production and shortness of breath.   All other systems reviewed and are negative.  Mobility: Very Limited  Vitals:   06/16/22 0039 06/16/22 0610  BP: 116/71 119/74  Pulse: 98 (!) 113  Resp: 20 16  Temp: 98.1 F (36.7 C)  97.6 F (36.4 C)  SpO2: 100% 92%    Intake/Output Summary (Last 24 hours) at 06/16/2022 0701 Last data filed at 06/16/2022 0140 Gross per 24 hour  Intake 920 ml  Output 1300 ml  Net -380 ml   Last Weight  Most recent update: 06/16/2022  6:11 AM    Weight  112.8 kg (248 lb 10.9 oz)            Gen: Elderly caucasian M in NAD HEENT: moist mucous membranes CV: Regular rate irregular rhythm, no murmurs rubs or gallops PULM: clear to auscultation bilaterally. No wheezes/rales/rhonchi ABD: soft/nontender/nondistended/normal bowel sounds EXT: +3 edema BLE/ +1 BUE Neuro: Alert and oriented x3  PPS: 30%   This conversation/these recommendations were discussed with patient primary care team, Dr. [name], Dr. Halford Chessman  Total Time: 105  Billing based on MDM: HIgh  Problems Addressed: One acute or chronic illness or injury that poses a threat to life or bodily function  Amount and/or Complexity of Data: Category 3:Discussion of management or test interpretation with external physician/other qualified health care professional/appropriate source (not separately reported)  Risks: Decision regarding hospitalization or escalation of hospital care and Decision not to resuscitate or to de-escalate care because of poor prognosis ______________________________________________________ [facility] Team Team Cell Phone: [phone] Please utilize secure chat with additional questions, if there is no response within 30 minutes please call the above phone number  Palliative Medicine Team providers are available by phone from 7am to 7pm daily and can be reached through the team cell phone.  Should this patient require assistance outside of these hours, please call the patient's attending physician.

## 2022-06-16 NOTE — Progress Notes (Signed)
[facility]   Patient name: Jeffrey Stephens Admit date: 06/10/2022  DOB: 14-Oct-1940 LOS: 5  MRN: [id number] Consult date: 06/10/2022  Referring provider: Dr. [name], Cardiology CC: Syncope    History:  81 yo male brought to ER with syncope.  SpO2 76% on 4 liters.  Admitted to hospital by hospitalist, and cardiology consulted.  PCCM consulted to assist with respiratory management.  He is followed by Dr. Lamonte Sakai as an outpt.  Past medical history:  A flutter on eliquis, HFpEF, COPD on 4 to 4 liters home oxygen, lung cancer s/p Rt lower lobectomy, PE, RA on MTX  Significant events:  9/16 Admit 9/17 neurology and cardiology consulted  9/22 palliative care consutled  Studies:  CT angio chest 9/16 >> severe emphysema, small effusions, b/l lower lung GGO Echo 9/18 >> EF 60 to 65%, mod RV dysfx  Interim history:  He is concerned about having cardioversion.    Vital signs:  BP (!) 114/59 (BP Location: Left Arm)   Pulse 100   Temp (!) 97.5 F (36.4 C) (Oral)   Resp 16   Ht 5\' 9"  (1.753 m)   Wt 112.8 kg   SpO2 93%   BMI 36.72 kg/m   Intake/output:  I/O last 3 completed shifts: In: 920 [P.O.:920] Out: 2000 [Urine:2000]   Physical exam:   General - alert Eyes - pupils reactive ENT - no sinus tenderness, no stridor Cardiac - irregular Chest - decreased BS, scattered rhonchi Abdomen - soft, non tender, + bowel sounds Extremities - 1+ edema Skin - no rashes Neuro - normal strength, moves extremities, follows commands Psych - normal mood and behavior  Best practice:   DVT - Eliqus SUP - Protonix Nutrition - NPO   Discussion:  His dyspnea is likely multifactorial - severe COPD with emphysema, possible rheumatoid lung disease versus reaction from MTX, HFpEF with A fib, and obesity with deconditioning.  Discussed how much of these conditions are chronic and not likely to improve.  Also explained that it is uncertain  how much benefit he would gain regarding his dyspnea from being cardioverted to sinus rhythm, but there is significant concern he could require long term ventilator support after undergoing any type of procedure like this.  As such, after discussion with his family and palliative care, he would like to defer attempt at cardioversion for now.  Assessment/plan:   COPD with acute exacerbation. - continue solumedrol 40 mg daily - continue brovana, pulmicort, yupelri - prn albuterol  Possible obstructive sleep apnea. - assess further as outpt  Acute on chronic hypoxic respiratory failure. - goal SpO2 90 to 95%  Syncope, persistent A flutter, Cor pulmonale, HFpEF. - per cardiology and primary team  Hx of RA. - followed by Dr. Lenna Gilford with rheumatology - MTX as outpt; on hold now - steroids likely helping with this  Resolved hospital problems:    Goals of care/Family discussions:  Code status: full code  Updated pt's family at bedside  Labs:      Latest Ref Rng & Units 06/16/2022    3:50 AM 06/15/2022    9:06 AM 06/14/2022    4:23 AM  CMP  Glucose 70 - 99 mg/dL 161  101  93   BUN 8 - 23 mg/dL 25  19  15    Creatinine 0.61 - 1.24 mg/dL 1.78  1.72  1.63   Sodium 135 - 145 mmol/L 135  134  138   Potassium 3.5 - 5.1 mmol/L 5.1  4.4  4.4   Chloride 98 - 111 mmol/L 99  97  99   CO2 22 - 32 mmol/L 26  29  28    Calcium 8.9 - 10.3 mg/dL 9.0  9.1  9.1   Total Protein 6.5 - 8.1 g/dL 5.8     Total Bilirubin 0.3 - 1.2 mg/dL 0.5     Alkaline Phos 38 - 126 U/L 38     AST 15 - 41 U/L 33     ALT 0 - 44 U/L 8          Latest Ref Rng & Units 06/16/2022    3:50 AM 06/14/2022    6:54 PM 06/14/2022    4:23 AM  CBC  WBC 4.0 - 10.5 K/uL 17.1   12.7   Hemoglobin 13.0 - 17.0 g/dL 8.5  9.0  9.1   Hematocrit 39.0 - 52.0 % 29.0  30.3  30.5   Platelets 150 - 400 K/uL 225   224     ABG    Component Value Date/Time   PHART 7.438 06/10/2022 1855   PCO2ART 44.5 06/10/2022 1855   PO2ART 71 (L)  06/10/2022 1855   HCO3 31.1 (H) 06/11/2022 0321   TCO2 32 06/11/2022 0321   ACIDBASEDEF 2.3 (H) 05/09/2016 0520   O2SAT 65 06/11/2022 0321    CBG (last 3)  Recent Labs    06/15/22 0523  GLUCAP 107*    Signature:  Chesley Mires, MD [place] Pager - [phone] 06/16/2022, 11:21 AM

## 2022-06-16 NOTE — TOC Progression Note (Addendum)
Transition of Care [facility]) - Progression Note    Patient Details  Name: Jeffrey Stephens MRN: [id number] Date of Birth: 1941/07/19  Transition of [facility]) CM/SW [place], LCSW Phone Number: 06/16/2022, 10:13 AM  Clinical Narrative:    10am-CSW spoke with Sharyn Lull at [facility] to confirm that they can accept patient on Tikosyn. She is checking with pharmacy. CSW following for CPAP/Bipap need at SNF. If needed, SNF will have to order one.   3:45pm-[facility] can provide Tikosyn.    Expected Discharge Plan: [place] Barriers to Discharge: Continued Medical Work up  Expected Discharge Plan and Services Expected Discharge Plan: [place]   Discharge Planning Services: CM Consult Post Acute Care Choice: [place] Living arrangements for the past 2 months: Single Family Home                                       Social Determinants of Health (SDOH) Interventions    Readmission Risk Interventions     No data to display

## 2022-06-16 NOTE — Progress Notes (Addendum)
Rounding Note    Patient Name: Jeffrey Stephens Date of Encounter: 06/16/2022  [place] Cardiologist: Sanda Klein, MD   Subjective   Patient states he is feeling a little better today. O2 sats initially 100% on 5L of HFNC when I walked in the room. Sats did drop to to 88-89% after a coughing fit. He is still coughing up dark brown phlegm and states he has been doing this for months. He notes possible slight improvement in his breathing. No chest pain or palpitations. He remains in atrial flutter with rates currently in the 90s to 110s.  Inpatient Medications    Scheduled Meds:  apixaban  5 mg Oral BID   arformoterol  15 mcg Nebulization BID   budesonide (PULMICORT) nebulizer solution  0.5 mg Nebulization BID   diltiazem  240 mg Oral Daily   dofetilide  250 mcg Oral BID   feeding supplement  237 mL Oral BID BM   folic acid  1 mg Oral q morning   magnesium oxide  400 mg Oral Daily   methylPREDNISolone (SOLU-MEDROL) injection  40 mg Intravenous Daily   multivitamin with minerals  1 tablet Oral Daily   pantoprazole  20 mg Oral Daily   revefenacin  175 mcg Nebulization Daily   sodium chloride flush  3 mL Intravenous Q12H   sodium chloride flush  3 mL Intravenous Q12H   sodium chloride flush  3 mL Intravenous Q12H   Continuous Infusions:  sodium chloride     sodium chloride     sodium chloride     PRN Meds: sodium chloride, sodium chloride, acetaminophen **OR** acetaminophen, acetaminophen, albuterol, diltiazem, guaiFENesin-dextromethorphan, loperamide, sodium chloride flush, sodium chloride flush   Vital Signs    Vitals:   06/15/22 2058 06/16/22 0039 06/16/22 0610 06/16/22 0801  BP: (!) 105/56 116/71 119/74   Pulse: 87 98 (!) 113   Resp: 16 20 16    Temp: 97.9 F (36.6 C) 98.1 F (36.7 C) 97.6 F (36.4 C)   TempSrc: Oral Axillary Oral   SpO2: 99% 100% 92% 96%  Weight:   112.8 kg   Height:        Intake/Output Summary (Last 24 hours) at 06/16/2022  0831 Last data filed at 06/16/2022 0140 Gross per 24 hour  Intake 800 ml  Output 1300 ml  Net -500 ml      06/16/2022    6:10 AM 06/15/2022    3:47 AM 06/14/2022    4:46 AM  Last 3 Weights  Weight (lbs) 248 lb 10.9 oz 248 lb 0.3 oz 247 lb 9.2 oz  Weight (kg) 112.8 kg 112.5 kg 112.3 kg      Telemetry    Atrial flutter with rates ranging from the 70s to 120s over the last several hours but currently in the 90s to 110s. - Personally Reviewed  ECG    No new ECG tracing today yet. - Personally Reviewed  Physical Exam   GEN: Obese Caucasian male in no acute distress.   Neck: JVD elevated. Cardiac: Tachycardic with irregular rhythm. No murmurs, rubs, or gallops.  Respiratory: On 5L of HFNC. No significant increased work of breathing. Diminished breath sounds throughout with faint expiratory wheeze. Course fibrotic crackles seem improved today. GI: Soft and distended. Bowel sounds present. MS: 3+ pitting edema of bilateral lower extremities. Mild pitting edema of bilateral upper extremities as well. No deformity. Skin: Warm and dry. Neuro:  No focal deficits. Psych: Normal affect. Responds appropriately.  Labs  High Sensitivity Troponin:   Recent Labs  Lab 06/10/22 1836 06/10/22 2037 06/11/22 0116 06/11/22 0429  TROPONINIHS 14 16 16 15      Chemistry Recent Labs  Lab 06/10/22 1836 06/10/22 1855 06/12/22 0244 06/12/22 1633 06/14/22 0423 06/15/22 0906 06/16/22 0350  NA 140   < > 139   < > 138 134* 135  K 3.8   < > 3.9   < > 4.4 4.4 5.1  CL 99   < > 101   < > 99 97* 99  CO2 28   < > 31   < > 28 29 26   GLUCOSE 109*   < > 108*   < > 93 101* 161*  BUN 13   < > 14   < > 15 19 25*  CREATININE 1.53*   < > 1.71*   < > 1.63* 1.72* 1.78*  CALCIUM 8.9   < > 8.8*   < > 9.1 9.1 9.0  MG  --   --   --    < > 2.1 2.1 2.2  PROT 6.3*  --  5.4*  --   --   --   --   ALBUMIN 2.9*  --  2.5*  --   --   --   --   AST 20  --  21  --   --   --   --   ALT 12  --  9  --   --   --   --    ALKPHOS 38  --  35*  --   --   --   --   BILITOT 1.5*  --  0.9  --   --   --   --   GFRNONAA 46*   < > 40*   < > 43* 40* 38*  ANIONGAP 13   < > 7   < > 11 8 10    < > = values in this interval not displayed.    Lipids No results for input(s): "CHOL", "TRIG", "HDL", "LABVLDL", "LDLCALC", "CHOLHDL" in the last 168 hours.  Hematology Recent Labs  Lab 06/12/22 0244 06/14/22 0423 06/14/22 1854 06/16/22 0350  WBC 9.2 12.7*  --  17.1*  RBC 3.45* 3.39*  --  3.26*  HGB 9.1* 9.1* 9.0* 8.5*  HCT 31.3* 30.5* 30.3* 29.0*  MCV 90.7 90.0  --  89.0  MCH 26.4 26.8  --  26.1  MCHC 29.1* 29.8*  --  29.3*  RDW 24.4* 25.0*  --  24.3*  PLT 278 224  --  225   Thyroid  Recent Labs  Lab 06/11/22 0116  TSH 2.479    BNP Recent Labs  Lab 06/10/22 1837  BNP 224.2*    DDimer No results for input(s): "DDIMER" in the last 168 hours.   Radiology    DG CHEST PORT 1 VIEW  Result Date: 06/15/2022 CLINICAL DATA:  Chronic respiratory failure EXAM: PORTABLE CHEST 1 VIEW COMPARISON:  06/10/2022 FINDINGS: Mild cardiomegaly is noted but stable. Aortic calcifications are seen. Interstitial fibrotic changes are noted stable from the prior exam. No acute infiltrate is seen. No bony abnormality is noted. New loop recorder is noted. IMPRESSION: Chronic fibrotic changes without acute abnormality. Electronically Signed   By: Inez Catalina M.D.   On: 06/15/2022 01:49    Cardiac Studies   Carotid Ultrasound 06/11/2022: Summary: - Right Carotid: Velocities in the right ICA are consistent with a 40-59% stenosis. The CCA & ECA were near-normal with only  minimal wall thickening or plaque.  - Left Carotid: The extracranial vessels were near-normal with only minimal  wall thickening or plaque.  - Vertebrals:  Bilateral vertebral arteries demonstrate antegrade flow.  - Subclavians: Normal flow hemodynamics were seen in bilateral subclavian arteries.  _______________   Echocardiogram 06/12/2022: Impression:  1. Left  ventricular ejection fraction, by estimation, is 60 to 65%. The  left ventricle has normal function. The left ventricle has no regional  wall motion abnormalities. Left ventricular diastolic function could not  be evaluated.   2. Right ventricular systolic function is moderately reduced. The right  ventricular size is mildly enlarged.   3. The mitral valve is normal in structure. Trivial mitral valve  regurgitation. No evidence of mitral stenosis.   4. The aortic valve is tricuspid. Aortic valve regurgitation is not  visualized. Aortic valve sclerosis is present, with no evidence of aortic  valve stenosis. Aortic valve area, by VTI measures 2.79 cm. Aortic valve  mean gradient measures 3.0 mmHg.  Aortic valve Vmax measures 1.09 m/s.   5. There is borderline dilatation of the aortic root, measuring 37 mm.  There is mild dilatation of the ascending aorta, measuring 39 mm. _______________   Loop Recorder 06/12/2022: Successful loop recorder implantation. _______________   Limited Echocardiogram with Bubble Study 06/13/2022: Impressions:  1. Agitated saline contrast bubble study was negative, with no evidence  of any interatrial shunt.   Patient Profile     81 y.o. male  with a history of coronary calcification noted on CTs dating back to at least 2010, atrial flutter on Eliquis, abdominal aortic aneurysm followed by Dr. Scot Dock, cerebral aneurysm complicated by secondary hemorrhagic stroke s/p clipping in 1994, borderline dilation of the ascending aorta, orthostatic hypotension, remote DVT, COPD on home O2, and rheumatoid arthritis who was admitted on 06/10/2022 with syncope.  Assessment & Plan    Syncope S/p Loop Recorder Patient was admitted following episode of syncope/unresponsiveness while seated at dinner table. Family attempted CPR in chair due to no evidence of breathing. Upon EMS arrival, O2 sat 76% and he was placed on a non-rebreather mask and gradually became more responsive.  He ultimately required BIPAP. Syncope suspected to be hypoxia related although arrhythmia cannot be ruled out. He also has a history of orthostatic hypotension but was not changing position at time of event. He also a history of persistent atrial flutter and severe bradycardia on Sotalol which led to discontinuation of this. He likely has some sinus dysfunction. Loop recorder was placed on 06/13/2022 to help monitor for any break through tachyarrhythmia or post-conversion pauses/ bradycardia that could have caused syncope.    Persistent Atrial Flutter  Looks like he has been in persistent atrial flutter since 12/2021. He was started on Tikosyn on 06/12/2022. QTc 459 ms this morning. - Rates in the 90s to low 110s this morning. - Potassium 5.1 and Magnesium 2.2 today. - Continue Tikosyn 250mg  twice daily.  - Currently on Cardizem 240mg  daily. Also on short acting Cardizem 30mg  every 8 hours as needed for heart rates sustaining in the 120s.  - Continue chronic anticoagulation with Eliquis 5mg  twice daily. - Plan is for DCCV today. I do worry that he may not tolerate sedation well with underlying pulmonary condition. He is still on 5L of HFNC today but this is improved from yesterday.  Shared Decision Making/Informed Consent{ The risks (stroke, cardiac arrhythmias rarely resulting in the need for a temporary or permanent pacemaker, skin irritation or burns and complications associated  with conscious sedation including aspiration, arrhythmia, respiratory failure and death), benefits (restoration of normal sinus rhythm) and alternatives of a direct current cardioversion were explained in detail to Mr. Hickox and he agrees to proceed.    Right Sided CHF Cor Pulmonale BNP mildly elevated at 224. Echo showed LVEF of 60-65% with no regional wall motion abnormalities and mildly enlarged RV with moderate reduced systolic function. Started on IV Lasix - this was increased to 80mg  twice daily yesterday.. Documented  urinary output of 1.3 L yesterday and net negative 2.6 L so far this admission. Weights unchanged. Creatinine slowly increasing and up to 1.78 today. - Still appears grossly volume overloaded on exam. - IV Lasix stopped this morning due to rising creatinine. - Albumin was 2.5 on 9/18 which is also likely contributing to edema. Will repeat LFTs. - Patient has underlying COPD and was well compensated until severe respiratory illness about 5 months ago and has been on O2 ever since. He had normal RV function in 12/2021. Overall impression suggest this severe illness led to marked change in pulmonary function and cor pulmonale since then.  - May need to consider right heart cath at some point.   Acute Hypoxic Respiratory Failure COPD History of Lung Cancer s/p Lobectomy in 2017 He has been on 4-5L of O2 at home since severe respiratory illness in April/May 2023. CT showed bilateral ground glass pulmonary infiltrates - consider inflammatory vs pulmonary edema. Echo with bubble study was negative for shunt. Pulmonology consulted this admission and also had concern for possible autoimmune mediate pneumonitis given history of rheumatoid arthritis and being on Methotrexate. Methotrexate has been stopped. Repeat chest x-ray on 9/21 due to increased O2 requirement showed chronic fibrotic changes but no acute findings. Pulmonology started Solumedrol yesterday. - Patient notes slight improvement in breathing today. O2 sat 100% on 5L of HFNC when I first saw him but did drop down to 88-89% after a coughing fit. He continues to cough up brown tinged phlegm. - WBC 17.1 today which is up from 12.7 on 9/20 but he was started on steroids yesterday. - He clearly has evidence of RV failure but persistent hypoxia is due to underlying pulmonary condition. Management per Pulmonology. Palliative Care has also been consulted.   Orthostatic Hypotension History of orthostatic hypotension. BP currently stable. - Continue to  monitor closely.   AKI Baseline creatinine around 0.9. Creatinine 1.50 on admission and peaked at 1.71 on 9/18. - Creatinine slowly increasing over the last couple of days: 1.63 >> 1.72 >> 1.78. - IV Lasix and potassium has been held.   Chronic Anemia Hemoglobin slowly dropping. 8.5 today which is down from 10.2 on admission. Hemoccult positive. - Management per primary team.  For questions or updates, please contact [place] Please consult [email or web address] for contact info under        Signed, Darreld Mclean, PA-C  06/16/2022, 8:31 AM    I have seen and examined the patient along with Darreld Mclean, PA-C , PA NP.  I have reviewed the chart, notes and new data.  I agree with PA/NP's note.  Key new complaints: After discussion with the pulmonary specialist and palliative care, Saim has decided he does not want to risk further respiratory decompensation and intubation with cardioversion.  The cardioversion was canceled.  He would rather go home and enjoy what ever time he has left.  Unfortunately, I think he has an overly optimistic expectation for his survival likelihood, ("if I can live for  another 2 to 3 years that would be better at home"). Key examination changes: Little change from yesterday.  Remains edematous.  Still requiring high flow oxygen.  Remains in atrial flutter with variable AV block.  Ventricular rates are in the 70s at night, in the low 100s to mid 110s during the day.  Has not had severe RVR. Key new findings / data: He has not had chemical conversion with dofetilide.  Creatinine has been increasing.  We will discontinue the intravenous furosemide.  Place back on p.o. furosemide.  If we are not going to perform cardioversion we can increase the dose of diltiazem, since we will not have to worry about bradycardia in sinus rhythm.  PLAN: I think that home hospice may be the best option for Mr. Romanello, unfortunately.  I do not think that his optimistic  estimation of 2 or 3 years to live is realistic.  I think it is more likely that his lifespan will be measured in months. He has received somewhat different opinions from different pulmonary critical care specialists, but they do seem to agree that it is unlikely he will have significant improvement in his respiratory function. If that is the case and we pursue a palliative care approach I would discontinue the dofetilide.  It is unlikely that this medication alone will lead to return to normal rhythm.  We can focus on achieving good rate control with diltiazem.  Ventricular rates less than 110 should be satisfactory.  Sanda Klein, MD, [place] [phone] 06/16/2022, 12:55 PM

## 2022-06-16 NOTE — Progress Notes (Signed)
OT Cancellation Note  Patient Details Name: ALGIS LEHENBAUER MRN: [id number] DOB: [birth year]   Cancelled Treatment:    Reason Eval/Treat Not Completed: Patient declined, no reason specified Pt has family present, requesting to talk to RN about getting NPO changed. Pt declined therapy at this time, agreeable to attempt in the afternoon. Will return as time allows and pt is appropriate.   [facility] OTR/L Acute Rehabilitation Services Office: [place] 06/16/2022, 11:47 AM

## 2022-06-16 NOTE — Progress Notes (Signed)
Pharmacy: Dofetilide (Tikosyn) - Follow Up Assessment and Electrolyte Replacement  Pharmacy consulted to assist in monitoring and replacing electrolytes in this 81 y.o. male admitted on 06/10/2022 undergoing dofetilide initiation.  Labs:    Component Value Date/Time   K 5.1 06/16/2022 0350   MG 2.2 06/16/2022 0350     Plan: Potassium: K >/= 4: No additional supplementation needed  Magnesium: Mg > 2: No additional supplementation needed  Thank you for allowing pharmacy to participate in this patient's care    Hildred Laser, PharmD Clinical Pharmacist **Pharmacist phone directory can now be found on [email or web address] ([email or web address]).  Listed under [place].

## 2022-06-16 NOTE — Progress Notes (Signed)
Pt refusing CPAP

## 2022-06-16 NOTE — Progress Notes (Signed)
PROGRESS NOTE    Jeffrey Stephens  BOF:[id number] DOB: 17-Apr-1941 DOA: 06/10/2022 PCP: Lavone Orn, MD   Brief Narrative:  81 year old male with medical history of atrial flutter on Eliquis, VTE, lung cancer s/p lobectomy in 2017, rheumatoid arthritis on methotrexate, COPD on home O2 4 L/min presented with syncope with prolonged unresponsiveness.  On EMS arrival, patient was found to be saturating in the 70s on 4 L oxygen via nasal cannula.  Apparently, patient has had 3 episodes of syncope in the last 3 weeks.  On presentation, CT of the head was unremarkable; CTA chest was negative for pulmonary embolism.  Cardiology was consulted.  EEG showed no seizure-like activity.  MRI of brain could not be performed.  Patient underwent loop recorder placement on 06/12/2022.  Currently on IV diuretics.  Assessment & Plan:   Acute on chronic hypoxic respiratory failure -Possibly from combination of diastolic heart failure and COPD -Cardiology following.  Pulmonary signed off on 06/14/2022 but reconsulted on 06/15/2022 because of worsening respiratory status. -Currently on 8 L high flow nasal cannula oxygen.  Patient has been started on IV Solu-Medrol by pulmonary.  Acute on chronic diastolic heart failure -Cardiology following.  Strict input and output.  Daily weights.  Fluid restriction.  Diuretics as per cardiology team: Currently on IV Lasix.  COPD with possible exacerbation exacerbation -Pulmonary reconsulted on 06/15/2022.  Continue IV Solu-Medrol -continue current nebulized regimen -Will need outpatient sleep study  Recurrent syncope -Questionable cause.  EEG showed no seizure, showed generalized nonspecific cerebral dysfunction -MRI of brain could not be done because of patient having aneurysm clips -Cardiology following.  Underwent implanted loop recorder placement on 06/12/2022. -PT recommending SNF placement.  TOC following. -Limited bubble echo study showed no evidence of interatrial  shunting  Acute kidney injury -Baseline creatinine around 0.9.  Creatinine 1.78 today.  Monitor  Persistent atrial flutter with RVR -Still tachycardic intermittently.  Continue Cardizem and dofetilide.  Cardiology following.  Continue Eliquis.  Cardiology planning for DC cardioversion today.  History of VTE -Continue Eliquis  Rheumatoid arthritis -Methotrexate on hold  Lung cancer status post lobectomy in 2017 -Stable  Generalized deconditioning -Will need SNF placement as per PT.  TOC following.  Goals of care -Overall prognosis is guarded.  Condition currently not improving.  Consult palliative care for goals of care discussion.  DVT prophylaxis: Eliquis Code Status: Full Family Communication: Wife and daughter at bedside on 06/15/2022. disposition Plan: Status is: Inpatient Remains inpatient appropriate because: Of severity of illness  Consultants: PCCM/cardiology  Procedures: Limited bubble echo study  Antimicrobials: None   Subjective: Patient seen and examined at bedside.  Still short of breath with slight exertion.  No worsening chest pain, fever, vomiting or abdominal pain reported   objective: Vitals:   06/15/22 2055 06/15/22 2058 06/16/22 0039 06/16/22 0610  BP:  (!) 105/56 116/71 119/74  Pulse: 88 87 98 (!) 113  Resp: 16 16 20 16   Temp:  97.9 F (36.6 C) 98.1 F (36.7 C) 97.6 F (36.4 C)  TempSrc:  Oral Axillary Oral  SpO2:  99% 100% 92%  Weight:    112.8 kg  Height:        Intake/Output Summary (Last 24 hours) at 06/16/2022 0742 Last data filed at 06/16/2022 0140 Gross per 24 hour  Intake 920 ml  Output 1300 ml  Net -380 ml    Filed Weights   06/14/22 0446 06/15/22 0347 06/16/22 0610  Weight: 112.3 kg 112.5 kg 112.8 kg  Examination:  General: Currently on 8 L high flow nasal cannula oxygen.  No acute distress.  Looks chronically ill and deconditioned. ENT/neck: No obvious JVD elevation or neck masses palpable respiratory: Bilateral  decreased breath sounds at bases with scattered crackles  CVS: Currently rate controlled; S1 and S2 are heard  abdominal: Soft, nontender, distended mildly; no organomegaly, bowel sounds heard Extremities: No clubbing; mild lower extremity edema present  CNS: Alert; still very slow to respond.  Poor historian.  No focal neurologic deficit.  Able to move extremities Lymph: No palpable cervical lymphadenopathy  skin: No obvious rashes/petechiae psych: Affect is extremely flat.  No signs of agitation currently. Musculoskeletal: No obvious other joint swelling/deformity     Data Reviewed: I have personally reviewed following labs and imaging studies  CBC: Recent Labs  Lab 06/10/22 1836 06/10/22 1855 06/11/22 0321 06/12/22 0244 06/14/22 0423 06/14/22 1854 06/16/22 0350  WBC 8.9  --   --  9.2 12.7*  --  17.1*  NEUTROABS 7.4  --   --   --   --   --  16.2*  HGB 10.2*   < > 10.9* 9.1* 9.1* 9.0* 8.5*  HCT 34.7*   < > 32.0* 31.3* 30.5* 30.3* 29.0*  MCV 90.1  --   --  90.7 90.0  --  89.0  PLT 331  --   --  278 224  --  225   < > = values in this interval not displayed.    Basic Metabolic Panel: Recent Labs  Lab 06/12/22 1633 06/12/22 1906 06/13/22 0622 06/14/22 0423 06/15/22 0906 06/16/22 0350  NA  --  135 139 138 134* 135  K  --  3.9 4.7 4.4 4.4 5.1  CL  --  100 102 99 97* 99  CO2  --  30 31 28 29 26   GLUCOSE  --  143* 92 93 101* 161*  BUN  --  14 15 15 19  25*  CREATININE  --  1.54* 1.64* 1.63* 1.72* 1.78*  CALCIUM  --  8.5* 9.0 9.1 9.1 9.0  MG 2.0  --  2.3 2.1 2.1 2.2    GFR: Estimated Creatinine Clearance: 41.6 mL/min (A) (by C-G formula based on SCr of 1.78 mg/dL (H)). Liver Function Tests: Recent Labs  Lab 06/10/22 1836 06/12/22 0244  AST 20 21  ALT 12 9  ALKPHOS 38 35*  BILITOT 1.5* 0.9  PROT 6.3* 5.4*  ALBUMIN 2.9* 2.5*    No results for input(s): "LIPASE", "AMYLASE" in the last 168 hours. No results for input(s): "AMMONIA" in the last 168  hours. Coagulation Profile: Recent Labs  Lab 06/10/22 1836  INR 1.9*    Cardiac Enzymes: No results for input(s): "CKTOTAL", "CKMB", "CKMBINDEX", "TROPONINI" in the last 168 hours. BNP (last 3 results) No results for input(s): "PROBNP" in the last 8760 hours. HbA1C: No results for input(s): "HGBA1C" in the last 72 hours. CBG: Recent Labs  Lab 06/10/22 1835 06/11/22 0639 06/12/22 0607 06/13/22 0516 06/15/22 0523  GLUCAP 102* 93 100* 93 107*    Lipid Profile: No results for input(s): "CHOL", "HDL", "LDLCALC", "TRIG", "CHOLHDL", "LDLDIRECT" in the last 72 hours. Thyroid Function Tests: No results for input(s): "TSH", "T4TOTAL", "FREET4", "T3FREE", "THYROIDAB" in the last 72 hours. Anemia Panel: No results for input(s): "VITAMINB12", "FOLATE", "FERRITIN", "TIBC", "IRON", "RETICCTPCT" in the last 72 hours. Sepsis Labs: Recent Labs  Lab 06/10/22 1837 06/10/22 2037 06/12/22 1601  PROCALCITON  --   --  <0.10  LATICACIDVEN 1.5 1.1  --  Recent Results (from the past 240 hour(s))  Respiratory (~20 pathogens) panel by PCR     Status: None   Collection Time: 06/11/22 12:46 AM   Specimen: Nasopharyngeal Swab; Respiratory  Result Value Ref Range Status   Adenovirus NOT DETECTED NOT DETECTED Final   Coronavirus 229E NOT DETECTED NOT DETECTED Final    Comment: (NOTE) The Coronavirus on the Respiratory Panel, DOES NOT test for the novel  Coronavirus (2019 nCoV)    Coronavirus HKU1 NOT DETECTED NOT DETECTED Final   Coronavirus NL63 NOT DETECTED NOT DETECTED Final   Coronavirus OC43 NOT DETECTED NOT DETECTED Final   Metapneumovirus NOT DETECTED NOT DETECTED Final   Rhinovirus / Enterovirus NOT DETECTED NOT DETECTED Final   Influenza A NOT DETECTED NOT DETECTED Final   Influenza B NOT DETECTED NOT DETECTED Final   Parainfluenza Virus 1 NOT DETECTED NOT DETECTED Final   Parainfluenza Virus 2 NOT DETECTED NOT DETECTED Final   Parainfluenza Virus 3 NOT DETECTED NOT DETECTED  Final   Parainfluenza Virus 4 NOT DETECTED NOT DETECTED Final   Respiratory Syncytial Virus NOT DETECTED NOT DETECTED Final   Bordetella pertussis NOT DETECTED NOT DETECTED Final   Bordetella Parapertussis NOT DETECTED NOT DETECTED Final   Chlamydophila pneumoniae NOT DETECTED NOT DETECTED Final   Mycoplasma pneumoniae NOT DETECTED NOT DETECTED Final    Comment: Performed at [facility], [place]. [address], [place], [place] [address]         Radiology Studies: DG CHEST PORT 1 VIEW  Result Date: 06/15/2022 CLINICAL DATA:  Chronic respiratory failure EXAM: PORTABLE CHEST 1 VIEW COMPARISON:  06/10/2022 FINDINGS: Mild cardiomegaly is noted but stable. Aortic calcifications are seen. Interstitial fibrotic changes are noted stable from the prior exam. No acute infiltrate is seen. No bony abnormality is noted. New loop recorder is noted. IMPRESSION: Chronic fibrotic changes without acute abnormality. Electronically Signed   By: Inez Catalina M.D.   On: 06/15/2022 01:49        Scheduled Meds:  apixaban  5 mg Oral BID   arformoterol  15 mcg Nebulization BID   budesonide (PULMICORT) nebulizer solution  0.5 mg Nebulization BID   diltiazem  240 mg Oral Daily   dofetilide  250 mcg Oral BID   feeding supplement  237 mL Oral BID BM   folic acid  1 mg Oral q morning   furosemide  80 mg Intravenous BID   magnesium oxide  400 mg Oral Daily   methylPREDNISolone (SOLU-MEDROL) injection  40 mg Intravenous Daily   multivitamin with minerals  1 tablet Oral Daily   pantoprazole  20 mg Oral Daily   potassium chloride  20 mEq Oral BID   revefenacin  175 mcg Nebulization Daily   sodium chloride flush  3 mL Intravenous Q12H   sodium chloride flush  3 mL Intravenous Q12H   sodium chloride flush  3 mL Intravenous Q12H   Continuous Infusions:  sodium chloride     sodium chloride     sodium chloride            Aline August, MD [facility] 06/16/2022, 7:42 AM

## 2022-06-17 DIAGNOSIS — Z515 Encounter for palliative care: Secondary | ICD-10-CM | POA: Diagnosis not present

## 2022-06-17 DIAGNOSIS — J9621 Acute and chronic respiratory failure with hypoxia: Secondary | ICD-10-CM | POA: Diagnosis not present

## 2022-06-17 DIAGNOSIS — Z7189 Other specified counseling: Secondary | ICD-10-CM | POA: Diagnosis not present

## 2022-06-17 DIAGNOSIS — I50812 Chronic right heart failure: Secondary | ICD-10-CM | POA: Diagnosis not present

## 2022-06-17 DIAGNOSIS — I4819 Other persistent atrial fibrillation: Secondary | ICD-10-CM | POA: Diagnosis not present

## 2022-06-17 DIAGNOSIS — R55 Syncope and collapse: Secondary | ICD-10-CM | POA: Diagnosis not present

## 2022-06-17 LAB — GLUCOSE, CAPILLARY
Glucose-Capillary: 126 mg/dL — ABNORMAL HIGH (ref 70–99)
Glucose-Capillary: 228 mg/dL — ABNORMAL HIGH (ref 70–99)
Glucose-Capillary: 108 mg/dL — ABNORMAL HIGH (ref 70–99)
Glucose-Capillary: 141 mg/dL — ABNORMAL HIGH (ref 70–99)
Glucose-Capillary: 185 mg/dL — ABNORMAL HIGH (ref 70–99)

## 2022-06-17 MED ORDER — PREDNISONE 20 MG PO TABS
30.0000 mg | ORAL_TABLET | Freq: Every day | ORAL | Status: DC
  Administered 2022-06-18 – 2022-06-19 (×2): 30 mg via ORAL
  Filled 2022-06-17 (×2): qty 1

## 2022-06-17 MED ORDER — FUROSEMIDE 40 MG PO TABS
40.0000 mg | ORAL_TABLET | Freq: Every day | ORAL | Status: DC
  Administered 2022-06-17 – 2022-06-19 (×3): 40 mg via ORAL
  Filled 2022-06-17 (×3): qty 1

## 2022-06-17 NOTE — Plan of Care (Signed)
  Problem: Physical Regulation: Goal: Complications related to the disease process, condition or treatment will be avoided or minimized Outcome: Progressing   Problem: Education: Goal: Knowledge of General Education information will improve Description: Including pain rating scale, medication(s)/side effects and non-pharmacologic comfort measures Outcome: Progressing

## 2022-06-17 NOTE — TOC Progression Note (Signed)
Transition of Care [facility]) - Progression Note    Patient Details  Name: Jeffrey Stephens MRN: [id number] Date of Birth: 08-31-1941  [facility]) CM/SW Contact  Jakobie Henslee G., RN Phone Number: 06/17/2022, 12:39 PM  Clinical Narrative:     RNCM received order for Home with Hospice.  RNCM spoke to patient's daughter, [phone], to offer choice for services.  [name] unsure and wants to speak with her brother today and make a decision tomorrow.  [name] to inform [facility] tomorrow of decision.  Expected Discharge Plan: [place] Barriers to Discharge: Continued Medical Work up  Expected Discharge Plan and Services Expected Discharge Plan: [place]   Discharge Planning Services: CM Consult Post Acute Care Choice: [place] Living arrangements for the past 2 months: Single Family Home                                      Social Determinants of Health (SDOH) Interventions    Readmission Risk Interventions     No data to display

## 2022-06-17 NOTE — Plan of Care (Signed)
  Problem: Education: Goal: Knowledge of condition and prescribed therapy will improve Outcome: Progressing   Problem: Cardiac: Goal: Will achieve and/or maintain adequate cardiac output Outcome: Progressing   Problem: Clinical Measurements: Goal: Respiratory complications will improve Outcome: Progressing   Problem: Coping: Goal: Level of anxiety will decrease Outcome: Progressing   Problem: Activity: Goal: Risk for activity intolerance will decrease Outcome: Progressing   Problem: Safety: Goal: Ability to remain free from injury will improve Outcome: Progressing   Problem: Skin Integrity: Goal: Risk for impaired skin integrity will decrease Outcome: Progressing

## 2022-06-17 NOTE — Progress Notes (Signed)
Refuses AM labs. Dr Starla Link and Dr Johnsie Cancel aware. Pt on Tikosyn. Okay to give Tikosyn per Dr Johnsie Cancel.

## 2022-06-17 NOTE — Progress Notes (Signed)
PROGRESS NOTE    Jeffrey Stephens  JIR:[id number] DOB: 1940/11/22 DOA: 06/10/2022 PCP: Lavone Orn, MD   Brief Narrative:  81 year old male with medical history of atrial flutter on Eliquis, VTE, lung cancer s/p lobectomy in 2017, rheumatoid arthritis on methotrexate, COPD on home O2 4 L/min presented with syncope with prolonged unresponsiveness.  On EMS arrival, patient was found to be saturating in the 70s on 4 L oxygen via nasal cannula.  Apparently, patient has had 3 episodes of syncope in the last 3 weeks.  On presentation, CT of the head was unremarkable; CTA chest was negative for pulmonary embolism.  Cardiology was consulted.  EEG showed no seizure-like activity.  MRI of brain could not be performed.  Patient underwent loop recorder placement on 06/12/2022.  Currently on IV diuretics.  Pulmonary following placement.  Palliative care was consulted for goals of care discussion.  Assessment & Plan:   Acute on chronic hypoxic respiratory failure -Possibly from combination of diastolic heart failure and COPD -Cardiology and pulmonary following.   -Currently on 4 L high flow nasal cannula oxygen.  Patient has been started on IV Solu-Medrol by pulmonary.  Acute on chronic diastolic heart failure -Cardiology following.  Strict input and output.  Daily weights.  Fluid restriction.  Diuretics as per cardiology team  COPD with possible exacerbation exacerbation -Pulmonary reconsulted on 06/15/2022.  Continue IV Solu-Medrol -continue current nebulized regimen -Will need outpatient sleep study  Recurrent syncope -Questionable cause.  EEG showed no seizure, showed generalized nonspecific cerebral dysfunction -MRI of brain could not be done because of patient having aneurysm clips -Cardiology following.  Underwent implanted loop recorder placement on 06/12/2022. -PT recommending SNF placement.  TOC following. -Limited bubble echo study showed no evidence of interatrial shunting  Acute kidney  injury -Baseline creatinine around 0.9.  Creatinine 1.78 on 06/16/2022.  Refused labs today.  Monitor  Leukocytosis -Refused labs today.  Persistent atrial flutter with RVR -Mostly rate controlled currently.  Continue Cardizem and dofetilide.  Cardiology following.  Continue Eliquis.  Cardiology had planned for DC cardioversion on 06/16/2022 but patient refused it.  History of VTE -Continue Eliquis  Rheumatoid arthritis -Methotrexate on hold  Lung cancer status post lobectomy in 2017 -Stable  Generalized deconditioning -Will need SNF placement as per PT.  TOC following.  Goals of care -Overall prognosis is guarded.  Condition currently not improving.  Palliative care following.  Remains full code.  DVT prophylaxis: Eliquis Code Status: Full Family Communication: Wife and daughter at bedside on 06/15/2022. disposition Plan: Status is: Inpatient Remains inpatient appropriate because: Of severity of illness  Consultants: PCCM/cardiology  Procedures: Limited bubble echo study  Antimicrobials: None   Subjective: Patient seen and examined at bedside.  No agitation, fever, chest pain or vomiting reported.  Still short of breath with minimal exertion.  Wanting to go home.  objective: Vitals:   06/16/22 1621 06/16/22 2010 06/17/22 0500 06/17/22 0628  BP: 106/62 106/68  112/63  Pulse: 77 74  (!) 59  Resp: (!) 22 20  19   Temp: 97.6 F (36.4 C) 97.6 F (36.4 C)  98.1 F (36.7 C)  TempSrc: Oral Oral  Oral  SpO2: (!) 89% 93%  92%  Weight:   111.4 kg   Height:        Intake/Output Summary (Last 24 hours) at 06/17/2022 0754 Last data filed at 06/17/2022 0645 Gross per 24 hour  Intake 120 ml  Output 950 ml  Net -830 ml    [employer]  06/15/22 0347 06/16/22 0610 06/17/22 0500  Weight: 112.5 kg 112.8 kg 111.4 kg    Examination:  General: No distress currently.  On 4 L oxygen via high flow nasal cannula. Looks chronically ill and deconditioned. ENT/neck: No palpable  neck masses or obvious JVD elevation noted  respiratory: Decreased breath sounds at bases bilaterally with some crackles  CVS: S1-S2 heard; mild intermittent tachycardia present abdominal: Soft, obese, nontender, still has some distention; no organomegaly, bowel sounds are heard normally Extremities: Bilateral lower extremity edema present; no cyanosis CNS: Awake; slow to respond.  Poor historian.  No focal neurologic deficit.  Moving extremities  lymph: No lymphadenopathy palpable  skin: No obvious ecchymosis/lesions  psych: Very flat affect.  Not agitated.   Musculoskeletal: No obvious other joint swelling/deformity     Data Reviewed: I have personally reviewed following labs and imaging studies  CBC: Recent Labs  Lab 06/10/22 1836 06/10/22 1855 06/11/22 0321 06/12/22 0244 06/14/22 0423 06/14/22 1854 06/16/22 0350  WBC 8.9  --   --  9.2 12.7*  --  17.1*  NEUTROABS 7.4  --   --   --   --   --  16.2*  HGB 10.2*   < > 10.9* 9.1* 9.1* 9.0* 8.5*  HCT 34.7*   < > 32.0* 31.3* 30.5* 30.3* 29.0*  MCV 90.1  --   --  90.7 90.0  --  89.0  PLT 331  --   --  278 224  --  225   < > = values in this interval not displayed.    Basic Metabolic Panel: Recent Labs  Lab 06/12/22 1633 06/12/22 1906 06/13/22 0622 06/14/22 0423 06/15/22 0906 06/16/22 0350  NA  --  135 139 138 134* 135  K  --  3.9 4.7 4.4 4.4 5.1  CL  --  100 102 99 97* 99  CO2  --  30 31 28 29 26   GLUCOSE  --  143* 92 93 101* 161*  BUN  --  14 15 15 19  25*  CREATININE  --  1.54* 1.64* 1.63* 1.72* 1.78*  CALCIUM  --  8.5* 9.0 9.1 9.1 9.0  MG 2.0  --  2.3 2.1 2.1 2.2    GFR: Estimated Creatinine Clearance: 41.4 mL/min (A) (by C-G formula based on SCr of 1.78 mg/dL (H)). Liver Function Tests: Recent Labs  Lab 06/10/22 1836 06/12/22 0244 06/16/22 0350  AST 20 21 33  ALT 12 9 8   ALKPHOS 38 35* 38  BILITOT 1.5* 0.9 0.5  PROT 6.3* 5.4* 5.8*  ALBUMIN 2.9* 2.5* 2.7*    No results for input(s): "LIPASE",  "AMYLASE" in the last 168 hours. No results for input(s): "AMMONIA" in the last 168 hours. Coagulation Profile: Recent Labs  Lab 06/10/22 1836 06/16/22 0948  INR 1.9* 2.5*    Cardiac Enzymes: No results for input(s): "CKTOTAL", "CKMB", "CKMBINDEX", "TROPONINI" in the last 168 hours. BNP (last 3 results) No results for input(s): "PROBNP" in the last 8760 hours. HbA1C: No results for input(s): "HGBA1C" in the last 72 hours. CBG: Recent Labs  Lab 06/16/22 0841 06/16/22 1229 06/16/22 1619 06/16/22 2105 06/17/22 0628  GLUCAP 137* 131* 146* 144* 126*    Lipid Profile: No results for input(s): "CHOL", "HDL", "LDLCALC", "TRIG", "CHOLHDL", "LDLDIRECT" in the last 72 hours. Thyroid Function Tests: No results for input(s): "TSH", "T4TOTAL", "FREET4", "T3FREE", "THYROIDAB" in the last 72 hours. Anemia Panel: No results for input(s): "VITAMINB12", "FOLATE", "FERRITIN", "TIBC", "IRON", "RETICCTPCT" in the last 72 hours.  Sepsis Labs: Recent Labs  Lab 06/10/22 1837 06/10/22 2037 06/12/22 1601  PROCALCITON  --   --  <0.10  LATICACIDVEN 1.5 1.1  --      Recent Results (from the past 240 hour(s))  Respiratory (~20 pathogens) panel by PCR     Status: None   Collection Time: 06/11/22 12:46 AM   Specimen: Nasopharyngeal Swab; Respiratory  Result Value Ref Range Status   Adenovirus NOT DETECTED NOT DETECTED Final   Coronavirus 229E NOT DETECTED NOT DETECTED Final    Comment: (NOTE) The Coronavirus on the Respiratory Panel, DOES NOT test for the novel  Coronavirus (2019 nCoV)    Coronavirus HKU1 NOT DETECTED NOT DETECTED Final   Coronavirus NL63 NOT DETECTED NOT DETECTED Final   Coronavirus OC43 NOT DETECTED NOT DETECTED Final   Metapneumovirus NOT DETECTED NOT DETECTED Final   Rhinovirus / Enterovirus NOT DETECTED NOT DETECTED Final   Influenza A NOT DETECTED NOT DETECTED Final   Influenza B NOT DETECTED NOT DETECTED Final   Parainfluenza Virus 1 NOT DETECTED NOT DETECTED Final    Parainfluenza Virus 2 NOT DETECTED NOT DETECTED Final   Parainfluenza Virus 3 NOT DETECTED NOT DETECTED Final   Parainfluenza Virus 4 NOT DETECTED NOT DETECTED Final   Respiratory Syncytial Virus NOT DETECTED NOT DETECTED Final   Bordetella pertussis NOT DETECTED NOT DETECTED Final   Bordetella Parapertussis NOT DETECTED NOT DETECTED Final   Chlamydophila pneumoniae NOT DETECTED NOT DETECTED Final   Mycoplasma pneumoniae NOT DETECTED NOT DETECTED Final    Comment: Performed at [facility], [place]. [address], [place], [place] [address]         Radiology Studies: No results found.      Scheduled Meds:  apixaban  5 mg Oral BID   arformoterol  15 mcg Nebulization BID   budesonide (PULMICORT) nebulizer solution  0.5 mg Nebulization BID   diltiazem  360 mg Oral Daily   dofetilide  250 mcg Oral BID   feeding supplement  237 mL Oral BID BM   folic acid  1 mg Oral q morning   insulin aspart  0-5 Units Subcutaneous QHS   insulin aspart  0-9 Units Subcutaneous TID WC   magnesium oxide  400 mg Oral Daily   methylPREDNISolone (SOLU-MEDROL) injection  40 mg Intravenous Daily   multivitamin with minerals  1 tablet Oral Daily   pantoprazole  20 mg Oral Daily   revefenacin  175 mcg Nebulization Daily   sodium chloride flush  3 mL Intravenous Q12H   Continuous Infusions:  sodium chloride     sodium chloride Stopped (06/16/22 1930)          Aline August, MD [facility] 06/17/2022, 7:54 AM

## 2022-06-17 NOTE — Progress Notes (Signed)
Pt refusing CPAP

## 2022-06-17 NOTE — Progress Notes (Signed)
Pulmonary and Critical Care Medicine   Patient name: Jeffrey Stephens Admit date: 06/10/2022  DOB: [birth year] LOS: 6  MRN: [id number] Consult date: 06/10/2022  Referring provider: Dr. [name], Cardiology CC: Syncope    History:  81 yo male brought to ER with syncope.  SpO2 76% on 4 liters.  Admitted to hospital by hospitalist, and cardiology consulted.  PCCM consulted to assist with respiratory management.  He is followed by Dr. Lamonte Sakai as an outpt.  Past medical history:  A flutter on eliquis, HFpEF, COPD on 4 to 4 liters home oxygen, lung cancer s/p Rt lower lobectomy, PE, RA on MTX  Significant events:  9/16 Admit 9/17 neurology and cardiology consulted  9/22 palliative care consutled  Studies:  CT angio chest 9/16 >> severe emphysema, small effusions, b/l lower lung GGO Echo 9/18 >> EF 60 to 65%, mod RV dysfx  Interim history:  Was able to sit at bedside yesterday.  Not having cough or chest congestion.  Vital signs:  BP 101/63   Pulse 95   Temp 97.7 F (36.5 C) (Oral)   Resp 18   Ht 5\' 9"  (1.753 m)   Wt 111.4 kg   SpO2 92%   BMI 36.27 kg/m   Intake/output:  I/O last 3 completed shifts: In: 480 [P.O.:480] Out: 1750 [Urine:1750]   Physical exam:   General - alert Eyes - pupils reactive ENT - no sinus tenderness, no stridor Cardiac - irregular Chest - equal breath sounds b/l, no wheezing or rales Abdomen - soft, non tender, + bowel sounds Extremities - decreased edema Skin - no rashes Neuro - normal strength, moves extremities, follows commands Psych - normal mood and behavior   Best practice:   DVT - Eliqus SUP - Protonix Nutrition - NPO   Discussion:  His dyspnea is likely multifactorial - severe COPD with emphysema, possible rheumatoid lung disease versus reaction from MTX, HFpEF with A fib, and obesity with deconditioning.  Discussed how much of these conditions are chronic and not likely to improve.  He is  interested in meeting with palliative care to discuss care plan in more detail.  Assessment/plan:   COPD with acute exacerbation. - can change to prednisone 30 mg daily on 9/24 and wean down as tolerated - continue brovana, pulmicort, yupelri - prn albuterol  Possible obstructive sleep apnea. - assess further as outpt depending on outcome from palliative care discussion  Acute on chronic hypoxic respiratory failure. - goal SpO2 90 to 95%  Syncope, persistent A flutter, Cor pulmonale, HFpEF. - per cardiology and primary team  Hx of RA. - followed by Dr. Lenna Gilford with rheumatology - MTX as outpt; on hold now - likely will need to remain on low dose prednisone for now   Goals of care. - palliative care consult pending - discussed with the pt and his wife the difference between palliative care services and hospice care  Resolved hospital problems:    Goals of care/Family discussions:  Code status: full code  Updated pt's family at bedside  Labs:      Latest Ref Rng & Units 06/16/2022    3:50 AM 06/15/2022    9:06 AM 06/14/2022    4:23 AM  CMP  Glucose 70 - 99 mg/dL 161  101  93   BUN 8 - 23 mg/dL 25  19  15    Creatinine 0.61 - 1.24 mg/dL 1.78  1.72  1.63   Sodium 135 - 145 mmol/L 135  134  138  Potassium 3.5 - 5.1 mmol/L 5.1  4.4  4.4   Chloride 98 - 111 mmol/L 99  97  99   CO2 22 - 32 mmol/L 26  29  28    Calcium 8.9 - 10.3 mg/dL 9.0  9.1  9.1   Total Protein 6.5 - 8.1 g/dL 5.8     Total Bilirubin 0.3 - 1.2 mg/dL 0.5     Alkaline Phos 38 - 126 U/L 38     AST 15 - 41 U/L 33     ALT 0 - 44 U/L 8          Latest Ref Rng & Units 06/16/2022    3:50 AM 06/14/2022    6:54 PM 06/14/2022    4:23 AM  CBC  WBC 4.0 - 10.5 K/uL 17.1   12.7   Hemoglobin 13.0 - 17.0 g/dL 8.5  9.0  9.1   Hematocrit 39.0 - 52.0 % 29.0  30.3  30.5   Platelets 150 - 400 K/uL 225   224     ABG    Component Value Date/Time   PHART 7.438 06/10/2022 1855   PCO2ART 44.5 06/10/2022 1855   PO2ART  71 (L) 06/10/2022 1855   HCO3 31.1 (H) 06/11/2022 0321   TCO2 32 06/11/2022 0321   ACIDBASEDEF 2.3 (H) 05/09/2016 0520   O2SAT 65 06/11/2022 0321    CBG (last 3)  Recent Labs    06/16/22 2105 06/17/22 0628 06/17/22 0752  GLUCAP 144* 126* 228*    Signature:  Chesley Mires, MD [place] Pager - [phone] 06/17/2022, 10:48 AM

## 2022-06-17 NOTE — Progress Notes (Signed)
Palliative Medicine Inpatient Follow Up Note  HPI: 81 year old male with prior history significant for but not limited to COPD, chronic hypoxic respiratory failure on 4-5L HOT, lung ca s/p RLL lobectomy 2017, atrial flutter and hx of VTE on Eliquis, RA on methotrexate who presented from home after syncopal episode with prolonged altered mental status at home on 9/16. Palliative care has been asked to get involved to further address goals of care in the setting of multiple co-morbid conditions.   Today's Discussion 06/17/2022  *Please note that this is a verbal dictation therefore any spelling or grammatical errors are due to the "[employer]" system interpretation.  Chart reviewed inclusive of vital signs, progress notes, laboratory results, and diagnostic images.   I met with Jeffrey Stephens at bedside this morning.  He expresses that yesterday he felt that there was some conflicting information she was uncertain of which direction to travel with his care.  We discussed that the cardiology team had come by and strongly recommended that Jeffrey Stephens consider going home with hospice. Created space and opportunity for patient to explore thoughts feelings and fears regarding current medical situation.  Jeffrey Stephens vocalizes that he understands now on conditions and is vascular conditions both place him at risk for death in a shorter period of time had initially thought.  We discussed that this would be the reason why hospice would be involved.  I described hospice as a service for patients who have a life expectancy of 6 months or less. The goal of hospice is the preservation of dignity and quality at the end phases of life. Under hospice care, the focus changes from curative to symptom relief.   Jeffrey Stephens and I reviewed that his family was going to visit today and he will continue to discuss this with them.  I did broach the topic of cardiopulmonary resuscitation with him again.  Per is more clear today now having a  better understanding of his disease burden that he would not want resuscitation or intubation.  I did confirm with him that he would now be a DO NOT RESUSCITATE CODE STATUS which he is aware of.  I was able to call patient's daughter [name] this afternoon.  She shares that her brother will be driving in the likely will get here until later.  We reviewed that being that Jeffrey Stephens spirits have been down him may be better to hold off on additional conversations regarding hospice today and to commence tomorrow at some point.  [name] is interested in learning more about home hospice and what could be offered.  I shared with her that the case manager would reach out to offer choice of hospice agency.  We discussed that they do deliver DME's and we discussed that home hospice is not 24/7 care therefore a lot of the onus would follow-up on family.  Questions and concerns addressed/Palliative Support Provided.   Objective Assessment: Vital Signs Vitals:   06/17/22 0942 06/17/22 1120  BP: 101/63 115/77  Pulse:  (!) 117  Resp:  18  Temp:  97.7 F (36.5 C)  SpO2:  94%    Intake/Output Summary (Last 24 hours) at 06/17/2022 1246 Last data filed at 06/17/2022 0951 Gross per 24 hour  Intake 123 ml  Output 950 ml  Net -827 ml   Last Weight  Most recent update: 06/17/2022  6:32 AM    Weight  111.4 kg (245 lb 9.5 oz)            Gen:  Elderly caucasian M in NAD HEENT: moist mucous membranes CV: Regular rate irregular rhythm, no murmurs rubs or gallops PULM: clear to auscultation bilaterally. No wheezes/rales/rhonchi ABD: soft/nontender/nondistended/normal bowel sounds EXT: +3 edema BLE/ +1 BUE Neuro: Alert and oriented x3  SUMMARY OF RECOMMENDATIONS   -DNAR/DN -Patient spoke to cardiology and received the insights needed -Patient is interested in going home with hospice care -Given that patient is overwhelmed by the information provided to him his family and I discussed that the palliative team will  come by again tomorrow for further conversations -Appreciate the case management team making an in-home -PMT will continue to follow  Billing based on MDM: High  Problems Addressed: One acute or chronic illness or injury that poses a threat to life or bodily function  Amount and/or Complexity of Data: Category 3:Discussion of management or test interpretation with external physician/other qualified health care professional/appropriate source (not separately reported)  Risks: Decision regarding hospitalization or escalation of hospital care and Decision not to resuscitate or to de-escalate care because of poor prognosis ______________________________________________________________________________________ [facility] Team Team Cell Phone: [phone] Please utilize secure chat with additional questions, if there is no response within 30 minutes please call the above phone number  Palliative Medicine Team providers are available by phone from 7am to 7pm daily and can be reached through the team cell phone.  Should this patient require assistance outside of these hours, please call the patient's attending physician.

## 2022-06-17 NOTE — Progress Notes (Signed)
Pharmacy: Dofetilide (Tikosyn) - Follow Up Assessment and Electrolyte Replacement  Pharmacy consulted to assist in monitoring and replacing electrolytes in this 81 y.o. male admitted on 06/10/2022 undergoing dofetilide initiation.  Labs:    Component Value Date/Time   K 5.1 06/16/2022 0350   MG 2.2 06/16/2022 0350     Plan: Potassium: K >/= 4: No additional supplementation needed  Magnesium: Mg > 2: No additional supplementation needed  Thank you for allowing pharmacy to participate in this patient's care    Sandford Craze, PharmD. [facility] Acute [facility] PGY-1  06/17/2022 8:54 AM

## 2022-06-17 NOTE — Progress Notes (Signed)
Rounding Note    Patient Name: Jeffrey Stephens Date of Encounter: 06/17/2022  [facility] Cardiologist: Sanda Klein, MD   Subjective   Wants to go home Getting breathing Rx  Inpatient Medications    Scheduled Meds:  apixaban  5 mg Oral BID   arformoterol  15 mcg Nebulization BID   budesonide (PULMICORT) nebulizer solution  0.5 mg Nebulization BID   diltiazem  360 mg Oral Daily   dofetilide  250 mcg Oral BID   feeding supplement  237 mL Oral BID BM   folic acid  1 mg Oral q morning   insulin aspart  0-5 Units Subcutaneous QHS   insulin aspart  0-9 Units Subcutaneous TID WC   magnesium oxide  400 mg Oral Daily   methylPREDNISolone (SOLU-MEDROL) injection  40 mg Intravenous Daily   multivitamin with minerals  1 tablet Oral Daily   pantoprazole  20 mg Oral Daily   revefenacin  175 mcg Nebulization Daily   sodium chloride flush  3 mL Intravenous Q12H   Continuous Infusions:  sodium chloride     sodium chloride Stopped (06/16/22 1930)   PRN Meds: sodium chloride, acetaminophen **OR** acetaminophen, acetaminophen, albuterol, diltiazem, guaiFENesin-dextromethorphan, loperamide, sodium chloride flush   Vital Signs    Vitals:   06/17/22 0500 06/17/22 0628 06/17/22 0754 06/17/22 0823  BP:  112/63 111/64   Pulse:  (!) 59 (!) 102 95  Resp:  19 18 18   Temp:  98.1 F (36.7 C) 97.7 F (36.5 C)   TempSrc:  Oral Oral   SpO2:  92%    Weight: 111.4 kg     Height:        Intake/Output Summary (Last 24 hours) at 06/17/2022 0839 Last data filed at 06/17/2022 0645 Gross per 24 hour  Intake 120 ml  Output 950 ml  Net -830 ml      06/17/2022    5:00 AM 06/16/2022    6:10 AM 06/15/2022    3:47 AM  Last 3 Weights  Weight (lbs) 245 lb 9.5 oz 248 lb 10.9 oz 248 lb 0.3 oz  Weight (kg) 111.4 kg 112.8 kg 112.5 kg      Telemetry    Atrial flutter with rates ranging from the 70s to 120s over the last several hours but currently in the 90s to 110s. - Personally  Reviewed  ECG    No new ECG tracing today yet. - Personally Reviewed  Physical Exam   GEN: Obese Caucasian male in no acute distress.   Neck: JVD elevated. Cardiac: Tachycardic with irregular rhythm. No murmurs, rubs, or gallops.  Respiratory: On 5L of HFNC. No significant increased work of breathing. Diminished breath sounds throughout with faint expiratory wheeze. Course fibrotic crackles seem improved today. GI: Soft and distended. Bowel sounds present. MS: 3+ pitting edema of bilateral lower extremities. Mild pitting edema of bilateral upper extremities as well. No deformity. Skin: Warm and dry. Neuro:  No focal deficits. Psych: Normal affect. Responds appropriately.  Labs    High Sensitivity Troponin:   Recent Labs  Lab 06/10/22 1836 06/10/22 2037 06/11/22 0116 06/11/22 0429  TROPONINIHS 14 16 16 15      Chemistry Recent Labs  Lab 06/10/22 1836 06/10/22 1855 06/12/22 0244 06/12/22 1633 06/14/22 0423 06/15/22 0906 06/16/22 0350  NA 140   < > 139   < > 138 134* 135  K 3.8   < > 3.9   < > 4.4 4.4 5.1  CL 99   < >  101   < > 99 97* 99  CO2 28   < > 31   < > 28 29 26   GLUCOSE 109*   < > 108*   < > 93 101* 161*  BUN 13   < > 14   < > 15 19 25*  CREATININE 1.53*   < > 1.71*   < > 1.63* 1.72* 1.78*  CALCIUM 8.9   < > 8.8*   < > 9.1 9.1 9.0  MG  --   --   --    < > 2.1 2.1 2.2  PROT 6.3*  --  5.4*  --   --   --  5.8*  ALBUMIN 2.9*  --  2.5*  --   --   --  2.7*  AST 20  --  21  --   --   --  33  ALT 12  --  9  --   --   --  8  ALKPHOS 38  --  35*  --   --   --  38  BILITOT 1.5*  --  0.9  --   --   --  0.5  GFRNONAA 46*   < > 40*   < > 43* 40* 38*  ANIONGAP 13   < > 7   < > 11 8 10    < > = values in this interval not displayed.    Lipids No results for input(s): "CHOL", "TRIG", "HDL", "LABVLDL", "LDLCALC", "CHOLHDL" in the last 168 hours.  Hematology Recent Labs  Lab 06/12/22 0244 06/14/22 0423 06/14/22 1854 06/16/22 0350  WBC 9.2 12.7*  --  17.1*  RBC 3.45*  3.39*  --  3.26*  HGB 9.1* 9.1* 9.0* 8.5*  HCT 31.3* 30.5* 30.3* 29.0*  MCV 90.7 90.0  --  89.0  MCH 26.4 26.8  --  26.1  MCHC 29.1* 29.8*  --  29.3*  RDW 24.4* 25.0*  --  24.3*  PLT 278 224  --  225   Thyroid  Recent Labs  Lab 06/11/22 0116  TSH 2.479    BNP Recent Labs  Lab 06/10/22 1837  BNP 224.2*    DDimer No results for input(s): "DDIMER" in the last 168 hours.   Radiology    No results found.  Cardiac Studies   Carotid Ultrasound 06/11/2022: Summary: - Right Carotid: Velocities in the right ICA are consistent with a 40-59% stenosis. The CCA & ECA were near-normal with only minimal wall thickening or plaque.  - Left Carotid: The extracranial vessels were near-normal with only minimal  wall thickening or plaque.  - Vertebrals:  Bilateral vertebral arteries demonstrate antegrade flow.  - Subclavians: Normal flow hemodynamics were seen in bilateral subclavian arteries.  _______________   Echocardiogram 06/12/2022: Impression:  1. Left ventricular ejection fraction, by estimation, is 60 to 65%. The  left ventricle has normal function. The left ventricle has no regional  wall motion abnormalities. Left ventricular diastolic function could not  be evaluated.   2. Right ventricular systolic function is moderately reduced. The right  ventricular size is mildly enlarged.   3. The mitral valve is normal in structure. Trivial mitral valve  regurgitation. No evidence of mitral stenosis.   4. The aortic valve is tricuspid. Aortic valve regurgitation is not  visualized. Aortic valve sclerosis is present, with no evidence of aortic  valve stenosis. Aortic valve area, by VTI measures 2.79 cm. Aortic valve  mean gradient measures 3.0 mmHg.  Aortic valve  Vmax measures 1.09 m/s.   5. There is borderline dilatation of the aortic root, measuring 37 mm.  There is mild dilatation of the ascending aorta, measuring 39 mm. _______________   Loop Recorder 06/12/2022: Successful  loop recorder implantation. _______________   Limited Echocardiogram with Bubble Study 06/13/2022: Impressions:  1. Agitated saline contrast bubble study was negative, with no evidence  of any interatrial shunt.   Patient Profile     81 y.o. male  with a history of coronary calcification noted on CTs dating back to at least 2010, atrial flutter on Eliquis, abdominal aortic aneurysm followed by Dr. Scot Dock, cerebral aneurysm complicated by secondary hemorrhagic stroke s/p clipping in 1994, borderline dilation of the ascending aorta, orthostatic hypotension, remote DVT, COPD on home O2, and rheumatoid arthritis who was admitted on 06/10/2022 with syncope.  Assessment & Plan    Syncope S/p Loop Recorder Patient was admitted following episode of syncope/unresponsiveness no significant long pauses noted in hospital ? Related to tranient hypoxia Loop recorder was placed on 06/13/2022 to help monitor for any break through tachyarrhythmia or post-conversion pauses/ bradycardia that could have caused syncope.    Persistent Atrial Flutter  Looks like he has been in persistent atrial flutter since 12/2021. He was started on Tikosyn on 06/12/2022. QTc 459 ms this morning. - rates ok this am - on eliquis - [facility] cancelled due to patient wishes and poor pulmonary status    Right Sided CHF Cor Pulmonale BNP mildly elevated at 224. Echo showed LVEF of 60-65% with no regional wall motion abnormalities and mildly enlarged RV with moderate reduced systolic function. Started on IV Lasix Oxygen requirements still high Should have pulmonary f/u post d/c  Palliative/Hospice appropriate    Acute Hypoxic Respiratory Failure COPD History of Lung Cancer s/p Lobectomy in 2017 He has been on 4-5L of O2 at home since severe respiratory illness in April/May 2023. CT showed bilateral ground glass pulmonary infiltrates - consider inflammatory vs pulmonary edema. Echo with bubble study was negative for shunt. Pulmonology  consulted this admission and also had concern for possible autoimmune mediate pneumonitis given history of rheumatoid arthritis and being on Methotrexate. Methotrexate has been stopped. Repeat chest x-ray on 9/21 due to increased O2 requirement showed chronic fibrotic changes but no acute findings. Pulmonology started Solumedrol yesterday. - Patient notes slight improvement in breathing today. O2 sat 100% on 5L of HFNC when I first saw him but did drop down to 88-89% after a coughing fit. He continues to cough up brown tinged phlegm. - WBC 17.1 today which is up from 12.7 on 9/20 but he was started on steroids yesterday. - He clearly has evidence of RV failure but persistent hypoxia is due to underlying pulmonary condition. Management per Pulmonology. Palliative Care has also been consulted.   Orthostatic Hypotension History of orthostatic hypotension. BP currently stable. - Continue to monitor closely.   AKI Baseline creatinine around 0.9. Creatinine 1.50 on admission and peaked at 1.71 on 9/18. - Creatinine slowly increasing over the last couple of days: 1.63 >> 1.72 >> 1.78. - patient refuses blood draw this am    Chronic Anemia Hemoglobin slowly dropping. 8.5 today which is down from 10.2 on admission. Hemoccult positive. - Management per primary team.  For questions or updates, please contact [place] Please consult [email or web address] for contact info under        Signed, Jenkins Rouge, MD  06/17/2022, 8:39 AM

## 2022-06-17 NOTE — Progress Notes (Signed)
Refused blood works. Education given. Patient acknowledged.

## 2022-06-18 DIAGNOSIS — Z515 Encounter for palliative care: Secondary | ICD-10-CM | POA: Diagnosis not present

## 2022-06-18 DIAGNOSIS — R55 Syncope and collapse: Secondary | ICD-10-CM | POA: Diagnosis not present

## 2022-06-18 DIAGNOSIS — J9621 Acute and chronic respiratory failure with hypoxia: Secondary | ICD-10-CM | POA: Diagnosis not present

## 2022-06-18 DIAGNOSIS — Z7189 Other specified counseling: Secondary | ICD-10-CM | POA: Diagnosis not present

## 2022-06-18 LAB — GLUCOSE, CAPILLARY
Glucose-Capillary: 171 mg/dL — ABNORMAL HIGH (ref 70–99)
Glucose-Capillary: 129 mg/dL — ABNORMAL HIGH (ref 70–99)
Glucose-Capillary: 156 mg/dL — ABNORMAL HIGH (ref 70–99)
Glucose-Capillary: 181 mg/dL — ABNORMAL HIGH (ref 70–99)

## 2022-06-18 MED ORDER — DILTIAZEM HCL ER COATED BEADS 360 MG PO CP24: 360.0000 mg | ORAL_CAPSULE | Freq: Every day | ORAL | 0 refills | Status: AC

## 2022-06-18 MED ORDER — GUAIFENESIN-DM 100-10 MG/5ML PO SYRP: 10.0000 mL | ORAL_SOLUTION | ORAL | 1 refills | Status: AC | PRN

## 2022-06-18 MED ORDER — PREDNISONE 10 MG PO TABS: ORAL_TABLET | ORAL | 0 refills | Status: AC

## 2022-06-18 MED ORDER — LOPERAMIDE HCL 2 MG PO CAPS: 2.0000 mg | ORAL_CAPSULE | Freq: Four times a day (QID) | ORAL | 0 refills | Status: AC | PRN

## 2022-06-18 NOTE — Progress Notes (Addendum)
Family chose [facility] for home hospice. [facility] for referral, spoke with receptionist, he will notify the nurse on call.   10:30 - Frankie at [facility] returned my call. She accepted the referral. She reports they may be able to deliver the DME today, but they can't admit pt today. Notifed Dr. Starla Link and Sharyn Lull, NP.   13:50 - [place], spoke with Sin, informed Sin that pt's daughter Jeffrey Stephens) wants hospice to call her and they want pt to be D/C as soon as possible. Provided [name]'s phone # [phone].

## 2022-06-18 NOTE — Progress Notes (Signed)
Palliative Medicine Inpatient Follow Up Note  HPI: 81 year old male with prior history significant for but not limited to COPD, chronic hypoxic respiratory failure on 4-5L HOT, lung ca s/p RLL lobectomy 2017, atrial flutter and hx of VTE on Eliquis, RA on methotrexate who presented from home after syncopal episode with prolonged altered mental status at home on 9/16. Palliative care has been asked to get involved to further address goals of care in the setting of multiple co-morbid conditions.   Today's Discussion 06/18/2022  *Please note that this is a verbal dictation therefore any spelling or grammatical errors are due to the "[employer]" system interpretation.  Chart reviewed inclusive of vital signs, progress notes, laboratory results, and diagnostic images.   I met with Jeffrey Stephens at bedside this morning. He shares that he has thought about things "all day" and vocalizes that at this point in time he feels an obligation to his family to "fight". We discussed that he has heart failure, aflutter, and advance lung disease which is uncurable. I shared that I worry things will not greatly improve over time and if anything they may worsen. I shared the importance of considering what the rest of his time on earth would look like. He expresses that it is very hard to conceptualize not being here. He feels that he needs to keep going for his grandchildren.   We reviewed the various options as below:   Skilled care is nursing and therapy care that can only be safely and effectively performed by, or under the supervision of, professionals or Metallurgist. It's health care given when you need skilled nursing or skilled therapy to treat, manage, and observe your condition, and evaluate your care. In a skilled nursing facility you'll receive one or more therapies for an average of one to two hours per day. This includes physical, occupational, and speech therapy. The therapies are not considered  intensive.   Palliative care is specialized medical care for people living with a serious illness, such as cancer, heart failure, COPD, Alzheimer's dementia, etc. Patients in palliative care may receive medical care for their symptoms and/or palliative care along with treatments intended to cure their serious illness.   Hospice care focuses on the care, comfort, and the quality of life for a person with a serious illness who is approaching the end of life. At some point, it may not be possible to cure a serious illness, or a patient may choose not to undergo certain treatments. Hospice is designed for this situation to preserve dignity and quality.  ________________________  We discussed that his family is coming in later this morning and perhaps more conversation is warranted then.  I did confirm though that Jeffrey Stephens would not want chest compressions, shocks, or intubation. He remains confident of this.   Questions and concerns addressed/Palliative Support Provided given the difficulty of the decisions and conversations at hand.  _______________________ Addendum:  I called patients daughter, Jeffrey Stephens this morning. She is concerned that Jeffrey Stephens is incrementally more disoriented. I shared that it would be worthwhile to speak to him as a family to get a better impression of his wishes. WE reviewed that he presently vocalized the desire to go to rehabilitation.  Jeffrey Stephens plans to come in later this morning with her two brothers to speak to the patient. I have offered to meet with them at that time.  ________________________ Addendum #2:  I spoke to patient's daughter Jeffrey Stephens who is present with her brothers at bedside  and had a conversation with Jeffrey Stephens this morning.  They all confirmed that the patient was confused when I met with him this morning and he is very much aligned with transitioning home with hospice.  We discussed the various hospice agencies and family elected [facility] [place].  We reviewed  the plan for the liaison to reach out in for further discussions from there.  Plan for [place] to transition home with [facility] [place] when medically optimized.  Objective Assessment: Vital Signs Vitals:   06/18/22 0350 06/18/22 0819  BP: 121/83   Pulse: 79   Resp: 19 20  Temp: 97.8 F (36.6 C)   SpO2: 94%     Intake/Output Summary (Last 24 hours) at 06/18/2022 0826 Last data filed at 06/18/2022 0430 Gross per 24 hour  Intake 563 ml  Output 500 ml  Net 63 ml    Last Weight  Most recent update: 06/18/2022  4:34 AM    Weight  111.8 kg (246 lb 7.6 oz)            Gen: Elderly caucasian M in NAD HEENT: moist mucous membranes CV: Regular rate irregular rhythm, no murmurs rubs or gallops PULM: clear to auscultation bilaterally. No wheezes/rales/rhonchi ABD: soft/nontender/nondistended/normal bowel sounds EXT: +3 edema BLE/ +1 BUE Neuro: Alert and oriented x3  SUMMARY OF RECOMMENDATIONS   -DNAR/DN -Clarification on goals completed today -Plan for patient to transition home with [facility] [place] transitions of care team involvement for referral -PMT will continue to follow  Time Spent: 67  Billing based on MDM: High  Problems Addressed: One acute or chronic illness or injury that poses a threat to life or bodily function  Amount and/or Complexity of Data: Category 3:Discussion of management or test interpretation with external physician/other qualified health care professional/appropriate source (not separately reported)  Risks: Decision regarding hospitalization or escalation of hospital care and Decision not to resuscitate or to de-escalate care because of poor prognosis ______________________________________________________________________________________ [place] Team Team Cell Phone: [phone] Please utilize secure chat with additional questions, if there is no response within 30 minutes please  call the above phone number  Palliative Medicine Team providers are available by phone from 7am to 7pm daily and can be reached through the team cell phone.  Should this patient require assistance outside of these hours, please call the patient's attending physician.

## 2022-06-18 NOTE — Progress Notes (Addendum)
PROGRESS NOTE    LUCIO LITSEY  EGB:[id number] DOB: 09-24-1941 DOA: 06/10/2022 PCP: Lavone Orn, MD   Brief Narrative:  81 year old male with medical history of atrial flutter on Eliquis, VTE, lung cancer s/p lobectomy in 2017, rheumatoid arthritis on methotrexate, COPD on home O2 4 L/min presented with syncope with prolonged unresponsiveness.  On EMS arrival, patient was found to be saturating in the 70s on 4 L oxygen via nasal cannula.  Apparently, patient has had 3 episodes of syncope in the last 3 weeks.  On presentation, CT of the head was unremarkable; CTA chest was negative for pulmonary embolism.  Cardiology was consulted.  EEG showed no seizure-like activity.  MRI of brain could not be performed.  Patient underwent loop recorder placement on 06/12/2022.  Currently on IV diuretics.  Pulmonary following placement.  Palliative care was consulted for goals of care discussion.  Assessment & Plan:   Acute on chronic hypoxic respiratory failure -Possibly from combination of diastolic heart failure and COPD -Cardiology and pulmonary following.   -Currently on 4 L high flow nasal cannula oxygen.  Patient has been switched to oral prednisone by pulmonary.  Acute on chronic diastolic heart failure -Cardiology following.  Strict input and output.  Daily weights.  Fluid restriction.  Diuretics as per cardiology team  COPD with possible exacerbation exacerbation -Pulmonary following.  Solu-Medrol has been switched to oral prednisone by pulmonary. -continue current nebulized regimen  Recurrent syncope -Questionable cause.  EEG showed no seizure, showed generalized nonspecific cerebral dysfunction -MRI of brain could not be done because of patient having aneurysm clips -Cardiology following.  Underwent implanted loop recorder placement on 06/12/2022. -Limited bubble echo study showed no evidence of interatrial shunting  Acute kidney injury -Baseline creatinine around 0.9.  Creatinine 1.78  on 06/16/2022.  Refused labs today as well.  Monitor  Leukocytosis -Refused labs today as well.  Persistent atrial flutter with RVR -Mostly rate controlled currently.  Continue Cardizem and dofetilide.  Cardiology following.  Continue Eliquis.  Cardiology had planned for DC cardioversion on 06/16/2022 but patient refused it.  History of VTE -Continue Eliquis  Rheumatoid arthritis -Methotrexate on hold  Lung cancer status post lobectomy in 2017 -Stable  Generalized deconditioning Goals of care -Overall prognosis is guarded.  Condition currently not improving.  Palliative care following.  CODE STATUS has been changed to DNR.  Patient/family contemplating home hospice.  DVT prophylaxis: Eliquis Code Status: DNR  family Communication: Son at bedside  disposition Plan: Status is: Inpatient Remains inpatient appropriate because: Of severity of illness  Consultants: PCCM/cardiology/palliative care  Procedures: Limited bubble echo study  Antimicrobials: None   Subjective: Patient seen and examined at bedside.  Poor historian.  Feels okay.  Denies worsening shortness of breath, fever or chest pain.  Objective: Vitals:   06/18/22 0350 06/18/22 0434 06/18/22 0819 06/18/22 0859  BP: 121/83   120/68  Pulse: 79   (!) 122  Resp: 19  20 20   Temp: 97.8 F (36.6 C)   97.8 F (36.6 C)  TempSrc: Oral   Oral  SpO2: 94%   96%  Weight:  111.8 kg    Height:        Intake/Output Summary (Last 24 hours) at 06/18/2022 1042 Last data filed at 06/18/2022 0430 Gross per 24 hour  Intake 440 ml  Output 500 ml  Net -60 ml    Filed Weights   06/16/22 0610 06/17/22 0500 06/18/22 0434  Weight: 112.8 kg 111.4 kg 111.8 kg  Examination:  General: On 4-5 L high flow nasal cannula oxygen.  No distress.  Looks chronically ill and deconditioned respiratory: Decreased breath sounds at bases bilaterally with some crackles CVS: Currently rate controlled; S1-S2 heard  abdominal: Soft, nontender,  slightly distended, no organomegaly; normal bowel sounds are heard  extremities: Lower extremity edema present bilaterally; no clubbing.        Data Reviewed: I have personally reviewed following labs and imaging studies  CBC: Recent Labs  Lab 06/12/22 0244 06/14/22 0423 06/14/22 1854 06/16/22 0350  WBC 9.2 12.7*  --  17.1*  NEUTROABS  --   --   --  16.2*  HGB 9.1* 9.1* 9.0* 8.5*  HCT 31.3* 30.5* 30.3* 29.0*  MCV 90.7 90.0  --  89.0  PLT 278 224  --  850    Basic Metabolic Panel: Recent Labs  Lab 06/12/22 1633 06/12/22 1906 06/13/22 0622 06/14/22 0423 06/15/22 0906 06/16/22 0350  NA  --  135 139 138 134* 135  K  --  3.9 4.7 4.4 4.4 5.1  CL  --  100 102 99 97* 99  CO2  --  30 31 28 29 26   GLUCOSE  --  143* 92 93 101* 161*  BUN  --  14 15 15 19  25*  CREATININE  --  1.54* 1.64* 1.63* 1.72* 1.78*  CALCIUM  --  8.5* 9.0 9.1 9.1 9.0  MG 2.0  --  2.3 2.1 2.1 2.2    GFR: Estimated Creatinine Clearance: 41.5 mL/min (A) (by C-G formula based on SCr of 1.78 mg/dL (H)). Liver Function Tests: Recent Labs  Lab 06/12/22 0244 06/16/22 0350  AST 21 33  ALT 9 8  ALKPHOS 35* 38  BILITOT 0.9 0.5  PROT 5.4* 5.8*  ALBUMIN 2.5* 2.7*    No results for input(s): "LIPASE", "AMYLASE" in the last 168 hours. No results for input(s): "AMMONIA" in the last 168 hours. Coagulation Profile: Recent Labs  Lab 06/16/22 0948  INR 2.5*    Cardiac Enzymes: No results for input(s): "CKTOTAL", "CKMB", "CKMBINDEX", "TROPONINI" in the last 168 hours. BNP (last 3 results) No results for input(s): "PROBNP" in the last 8760 hours. HbA1C: No results for input(s): "HGBA1C" in the last 72 hours. CBG: Recent Labs  Lab 06/17/22 0752 06/17/22 1122 06/17/22 1615 06/17/22 2020 06/18/22 0858  GLUCAP 228* 108* 141* 185* 171*    Lipid Profile: No results for input(s): "CHOL", "HDL", "LDLCALC", "TRIG", "CHOLHDL", "LDLDIRECT" in the last 72 hours. Thyroid Function Tests: No results for  input(s): "TSH", "T4TOTAL", "FREET4", "T3FREE", "THYROIDAB" in the last 72 hours. Anemia Panel: No results for input(s): "VITAMINB12", "FOLATE", "FERRITIN", "TIBC", "IRON", "RETICCTPCT" in the last 72 hours. Sepsis Labs: Recent Labs  Lab 06/12/22 1601  PROCALCITON <0.10     Recent Results (from the past 240 hour(s))  Respiratory (~20 pathogens) panel by PCR     Status: None   Collection Time: 06/11/22 12:46 AM   Specimen: Nasopharyngeal Swab; Respiratory  Result Value Ref Range Status   Adenovirus NOT DETECTED NOT DETECTED Final   Coronavirus 229E NOT DETECTED NOT DETECTED Final    Comment: (NOTE) The Coronavirus on the Respiratory Panel, DOES NOT test for the novel  Coronavirus (2019 nCoV)    Coronavirus HKU1 NOT DETECTED NOT DETECTED Final   Coronavirus NL63 NOT DETECTED NOT DETECTED Final   Coronavirus OC43 NOT DETECTED NOT DETECTED Final   Metapneumovirus NOT DETECTED NOT DETECTED Final   Rhinovirus / Enterovirus NOT DETECTED NOT DETECTED Final  Influenza A NOT DETECTED NOT DETECTED Final   Influenza B NOT DETECTED NOT DETECTED Final   Parainfluenza Virus 1 NOT DETECTED NOT DETECTED Final   Parainfluenza Virus 2 NOT DETECTED NOT DETECTED Final   Parainfluenza Virus 3 NOT DETECTED NOT DETECTED Final   Parainfluenza Virus 4 NOT DETECTED NOT DETECTED Final   Respiratory Syncytial Virus NOT DETECTED NOT DETECTED Final   Bordetella pertussis NOT DETECTED NOT DETECTED Final   Bordetella Parapertussis NOT DETECTED NOT DETECTED Final   Chlamydophila pneumoniae NOT DETECTED NOT DETECTED Final   Mycoplasma pneumoniae NOT DETECTED NOT DETECTED Final    Comment: Performed at [facility], [place] [address], [place], [place] [address]         Radiology Studies: No results found.      Scheduled Meds:  apixaban  5 mg Oral BID   arformoterol  15 mcg Nebulization BID   budesonide (PULMICORT) nebulizer solution  0.5 mg Nebulization BID   diltiazem  360 mg Oral Daily    dofetilide  250 mcg Oral BID   feeding supplement  237 mL Oral BID BM   folic acid  1 mg Oral q morning   furosemide  40 mg Oral Daily   insulin aspart  0-5 Units Subcutaneous QHS   insulin aspart  0-9 Units Subcutaneous TID WC   magnesium oxide  400 mg Oral Daily   multivitamin with minerals  1 tablet Oral Daily   pantoprazole  20 mg Oral Daily   predniSONE  30 mg Oral Q breakfast   revefenacin  175 mcg Nebulization Daily   sodium chloride flush  3 mL Intravenous Q12H   Continuous Infusions:  sodium chloride            Aline August, MD [facility] 06/18/2022, 10:42 AM

## 2022-06-18 NOTE — Progress Notes (Signed)
[facility]   Patient name: Jeffrey Stephens Admit date: 06/10/2022  DOB: 19-Oct-1940 LOS: 7  MRN: [id number] Consult date: 06/10/2022  Referring provider: Dr. [name], Cardiology CC: Syncope    History:  81 yo male brought to ER with syncope.  SpO2 76% on 4 liters.  Admitted to hospital by hospitalist, and cardiology consulted.  PCCM consulted to assist with respiratory management.  He is followed by Dr. Lamonte Sakai as an outpt.  Past medical history:  A flutter on eliquis, HFpEF, COPD on 4 to 4 liters home oxygen, lung cancer s/p Rt lower lobectomy, PE, RA on MTX  Significant events:  9/16 Admit 9/17 neurology and cardiology consulted  9/22 palliative care consulted 9/24 transition to hospice care  Studies:  CT angio chest 9/16 >> severe emphysema, small effusions, b/l lower lung GGO Echo 9/18 >> EF 60 to 65%, mod RV dysfx  Interim history:  He has decided to transition to home hospice.  Vital signs:  BP 120/68 (BP Location: Right Arm)   Pulse (!) 122   Temp 97.8 F (36.6 C) (Oral)   Resp 20   Ht 5\' 9"  (1.753 m)   Wt 111.8 kg   SpO2 96%   BMI 36.40 kg/m   Intake/output:  I/O last 3 completed shifts: In: 42 [P.O.:680; I.V.:3] Out: 1450 [Urine:1450]   Physical exam:   General - alert Eyes - pupils reactive ENT - no sinus tenderness, no stridor Cardiac - irregular Chest - equal breath sounds b/l, no wheezing or rales Abdomen - soft, non tender, + bowel sounds Extremities - decreased edema Skin - no rashes Neuro - normal strength, moves extremities, follows commands Psych - normal mood and behavior   Best practice:   DVT - Eliqus SUP - Protonix Nutrition - NPO    Assessment/plan:   COPD with acute exacerbation. - wean down prednisone as tolerated - can transition back to spiriva and symbicort as outpatient - prn albuterol  Acute on chronic hypoxic respiratory failure. - supplemental oxygen as  needed to keep SpO2 > 90%  Syncope, persistent A flutter, Cor pulmonale, HFpEF. - per cardiology and primary team  Hx of RA. - followed by Dr. Lenna Gilford with rheumatology - will need to determine if he should remain on low dose prednisone versus resuming methotrexate  Goals of care. - palliative care team helping to arrange for home hospice care  PCCM will sign off.  Please call if additional help needed while he is in hospital.  Resolved hospital problems:    Goals of care/Family discussions:  Code status: DNR  Updated pt's family at bedside  Labs:      Latest Ref Rng & Units 06/16/2022    3:50 AM 06/15/2022    9:06 AM 06/14/2022    4:23 AM  CMP  Glucose 70 - 99 mg/dL 161  101  93   BUN 8 - 23 mg/dL 25  19  15    Creatinine 0.61 - 1.24 mg/dL 1.78  1.72  1.63   Sodium 135 - 145 mmol/L 135  134  138   Potassium 3.5 - 5.1 mmol/L 5.1  4.4  4.4   Chloride 98 - 111 mmol/L 99  97  99   CO2 22 - 32 mmol/L 26  29  28    Calcium 8.9 - 10.3 mg/dL 9.0  9.1  9.1   Total Protein 6.5 - 8.1 g/dL 5.8     Total Bilirubin 0.3 - 1.2 mg/dL 0.5  Alkaline Phos 38 - 126 U/L 38     AST 15 - 41 U/L 33     ALT 0 - 44 U/L 8          Latest Ref Rng & Units 06/16/2022    3:50 AM 06/14/2022    6:54 PM 06/14/2022    4:23 AM  CBC  WBC 4.0 - 10.5 K/uL 17.1   12.7   Hemoglobin 13.0 - 17.0 g/dL 8.5  9.0  9.1   Hematocrit 39.0 - 52.0 % 29.0  30.3  30.5   Platelets 150 - 400 K/uL 225   224     ABG    Component Value Date/Time   PHART 7.438 06/10/2022 1855   PCO2ART 44.5 06/10/2022 1855   PO2ART 71 (L) 06/10/2022 1855   HCO3 31.1 (H) 06/11/2022 0321   TCO2 32 06/11/2022 0321   ACIDBASEDEF 2.3 (H) 05/09/2016 0520   O2SAT 65 06/11/2022 0321    CBG (last 3)  Recent Labs    06/17/22 1615 06/17/22 2020 06/18/22 0858  GLUCAP 141* 185* 171*    Signature:  Chesley Mires, MD [place] Pager - [phone] 06/18/2022, 11:32 AM

## 2022-06-18 NOTE — Progress Notes (Signed)
Pt has been approved for Hospice services through [facility]. DME has been ordered with the request of it being delivered sometime this evening. We will be ready to admit him in his home tomorrow after discharge. Thank you for allowing [place] to serve Jeffrey Stephens and his family. Please call if you have any questions or concerns.  Lin Landsman, RN BSN [facility] [phone]

## 2022-06-18 NOTE — Progress Notes (Signed)
Refused blood works. Education given ( Importance of K and Mg level before administration of Tikosyn). Patient acknowledged. Plan of care ongoing.

## 2022-06-18 NOTE — Discharge Summary (Signed)
Physician Discharge Summary  Jeffrey Stephens:[id number] DOB: May 07, 1941 DOA: 06/10/2022  PCP: Lavone Orn, MD  Admit date: 06/10/2022 Discharge date: 06/18/2022  Admitted From: Home Disposition: Home with hospice  Recommendations for Outpatient Follow-up:  Follow up with home hospice at earliest convenience Outpatient follow-up with pulmonary/cardiology if needed    Home Health: Home with hospice Equipment/Devices: Supplemental oxygen by nasal cannula  Discharge Condition: Poor  CODE STATUS: DNR Diet recommendation: Heart healthy/fluid restriction of up to 1500 cc a day  Brief/Interim Summary: 81 year old male with medical history of atrial flutter on Eliquis, VTE, lung cancer s/p lobectomy in 2017, rheumatoid arthritis on methotrexate, COPD on home O2 4 L/min presented with syncope with prolonged unresponsiveness.  On EMS arrival, patient was found to be saturating in the 70s on 4 L oxygen via nasal cannula.  Apparently, patient has had 3 episodes of syncope in the last 3 weeks.  On presentation, CT of the head was unremarkable; CTA chest was negative for pulmonary embolism.  Cardiology was consulted.  EEG showed no seizure-like activity.  MRI of brain could not be performed.  Patient underwent loop recorder placement on 06/12/2022.  Currently on IV diuretics.  Pulmonary following placement.  Palliative care was consulted for goals of care discussion.  His condition did not improve much during the hospitalization.  Please refer to the daily progress note for full details of the hospitalization.  Subsequently, patient/family have decided to go home with hospice.  He will be discharged to home with hospice once arrangements are made.    Discharge Diagnoses:   Acute on chronic hypoxic respiratory failure -Possibly from combination of diastolic heart failure and COPD -Cardiology and pulmonary following.   -Currently on 4 L high flow nasal cannula oxygen.  Patient has been switched to  oral prednisone by pulmonary.   -Discharge patient home today.  On discharge, continue prednisone 30 mg daily for 2 days, 20 mg daily for 2 days then 10 mg daily till reevaluated by PCP.   Acute on chronic diastolic heart failure -Cardiology following.  Continue diet and fluid restriction.  Currently on Lasix 40 mg daily as per cardiology which will be continued on discharge.    COPD with possible exacerbation exacerbation -Pulmonary following.  Solu-Medrol has been switched to oral prednisone by pulmonary.  Plan as above. -continue home inhaled/nebulized regimen.  Recurrent syncope -Questionable cause.  EEG showed no seizure, showed generalized nonspecific cerebral dysfunction -MRI of brain could not be done because of patient having aneurysm clips -Cardiology following.  Underwent implanted loop recorder placement on 06/12/2022. -Limited bubble echo study showed no evidence of interatrial shunting   Acute kidney injury -Baseline creatinine around 0.9.  Creatinine 1.78 on 06/16/2022.  Refused labs for the last 2 days.  Leukocytosis -Refused labs today as well.   Persistent atrial flutter with RVR -Mostly rate controlled currently.  Continue Cardizem on discharge.  Will not continue dofetilide on discharge as patient has refused labs for the last 2 days: Communicated with Dr. Nishan/cardiology via secure chat who recommended to DC dofetilide.  Cardiology following.  Continue Eliquis.  Cardiology had planned for DC cardioversion on 06/16/2022 but patient refused it.   History of VTE -Continue Eliquis   Rheumatoid arthritis -Methotrexate on hold   Lung cancer status post lobectomy in 2017 -Stable   Generalized deconditioning Goals of care -Overall prognosis is guarded.  Condition currently not improving.  Palliative care following.  CODE STATUS has been changed to DNR.  Patient will be  discharged home with hospice Discharge Instructions  Discharge Instructions     Diet - low  sodium heart healthy   Complete by: As directed    Increase activity slowly   Complete by: As directed    No wound care   Complete by: As directed       Allergies as of 06/18/2022   No Known Allergies      Medication List     STOP taking these medications    docusate sodium 100 MG capsule Commonly known as: COLACE   lactulose 10 GM/15ML solution Commonly known as: CHRONULAC   methotrexate 2.5 MG tablet   oxyCODONE 5 MG immediate release tablet Commonly known as: Oxy IR/ROXICODONE       TAKE these medications    acetaminophen 500 MG tablet Commonly known as: TYLENOL Take 1,000 mg by mouth every 6 (six) hours as needed for moderate pain.   albuterol 108 (90 Base) MCG/ACT inhaler Commonly known as: VENTOLIN HFA TAKE 2 PUFFS BY MOUTH EVERY 6 HOURS AS NEEDED FOR WHEEZE OR SHORTNESS OF BREATH What changed:  how much to take how to take this when to take this reasons to take this additional instructions   albuterol (2.5 MG/3ML) 0.083% nebulizer solution Commonly known as: PROVENTIL Take 3 mLs (2.5 mg total) by nebulization every 6 (six) hours as needed for wheezing or shortness of breath. What changed: Another medication with the same name was changed. Make sure you understand how and when to take each.   budesonide-formoterol 160-4.5 MCG/ACT inhaler Commonly known as: Symbicort Inhale 2 puffs into the lungs 2 (two) times daily.   Centrum Silver Adult 50+ Tabs Take 1 tablet by mouth daily.   diltiazem 360 MG 24 hr capsule Commonly known as: CARDIZEM CD Take 1 capsule (360 mg total) by mouth daily. Start taking on: June 19, 2022 What changed:  medication strength how much to take   Eliquis 5 MG Tabs tablet Generic drug: apixaban TAKE 1 TABLET BY MOUTH TWICE A DAY What changed: how much to take   folic acid 1 MG tablet Commonly known as: FOLVITE Take 1 mg by mouth every morning.   furosemide 40 MG tablet Commonly known as: LASIX Take 1 tablet  (40 mg total) by mouth daily.   guaiFENesin-dextromethorphan 100-10 MG/5ML syrup Commonly known as: ROBITUSSIN DM Take 10 mLs by mouth every 4 (four) hours as needed for cough.   ipratropium-albuterol 0.5-2.5 (3) MG/3ML Soln Commonly known as: DUONEB Take 3 mLs by nebulization every 6 (six) hours as needed.   lansoprazole 30 MG capsule Commonly known as: PREVACID Take 30 mg by mouth every morning.   loperamide 2 MG capsule Commonly known as: IMODIUM Take 1 capsule (2 mg total) by mouth every 6 (six) hours as needed for diarrhea or loose stools.   methocarbamol 500 MG tablet Commonly known as: ROBAXIN Take 1 tablet (500 mg total) by mouth every 6 (six) hours as needed for muscle spasms.   potassium chloride SA 20 MEQ tablet Commonly known as: KLOR-CON M Take 1 tablet (20 mEq total) by mouth daily.   predniSONE 10 MG tablet Commonly known as: DELTASONE 30mg  daily for 2 days, then 20mg  daily for 2 days, then 10 mg daily till reevaluation by PCP Start taking on: June 20, 2022   Spiriva HandiHaler 18 MCG inhalation capsule Generic drug: tiotropium Place 1 capsule (18 mcg total) into inhaler and inhale daily.   VITAMIN D3 PO Take 2,000 mcg by mouth in the morning  and at bedtime.        No Known Allergies  Consultations: PCCM/cardiology/palliative care   Subjective: Patient seen and examined at bedside.  Poor historian.  Feels okay.  Denies worsening shortness of breath, fever or chest pain.   Discharge Exam: Vitals:   06/18/22 0859 06/18/22 1134  BP: 120/68 100/82  Pulse: (!) 122 (!) 122  Resp: 20 20  Temp: 97.8 F (36.6 C) 97.9 F (36.6 C)  SpO2: 96% 92%    General: On 4-5 L high flow nasal cannula oxygen.  No distress.  Looks chronically ill and deconditioned respiratory: Decreased breath sounds at bases bilaterally with some crackles CVS: Currently rate controlled; S1-S2 heard  abdominal: Soft, nontender, slightly distended, no organomegaly; normal  bowel sounds are heard  extremities: Lower extremity edema present bilaterally; no clubbing.        The results of significant diagnostics from this hospitalization (including imaging, microbiology, ancillary and laboratory) are listed below for reference.     Microbiology: Recent Results (from the past 240 hour(s))  Respiratory (~20 pathogens) panel by PCR     Status: None   Collection Time: 06/11/22 12:46 AM   Specimen: Nasopharyngeal Swab; Respiratory  Result Value Ref Range Status   Adenovirus NOT DETECTED NOT DETECTED Final   Coronavirus 229E NOT DETECTED NOT DETECTED Final    Comment: (NOTE) The Coronavirus on the Respiratory Panel, DOES NOT test for the novel  Coronavirus (2019 nCoV)    Coronavirus HKU1 NOT DETECTED NOT DETECTED Final   Coronavirus NL63 NOT DETECTED NOT DETECTED Final   Coronavirus OC43 NOT DETECTED NOT DETECTED Final   Metapneumovirus NOT DETECTED NOT DETECTED Final   Rhinovirus / Enterovirus NOT DETECTED NOT DETECTED Final   Influenza A NOT DETECTED NOT DETECTED Final   Influenza B NOT DETECTED NOT DETECTED Final   Parainfluenza Virus 1 NOT DETECTED NOT DETECTED Final   Parainfluenza Virus 2 NOT DETECTED NOT DETECTED Final   Parainfluenza Virus 3 NOT DETECTED NOT DETECTED Final   Parainfluenza Virus 4 NOT DETECTED NOT DETECTED Final   Respiratory Syncytial Virus NOT DETECTED NOT DETECTED Final   Bordetella pertussis NOT DETECTED NOT DETECTED Final   Bordetella Parapertussis NOT DETECTED NOT DETECTED Final   Chlamydophila pneumoniae NOT DETECTED NOT DETECTED Final   Mycoplasma pneumoniae NOT DETECTED NOT DETECTED Final    Comment: Performed at [facility], [place]. [address] [place] [employer], [place] [address]     Labs: BNP (last 3 results) Recent Labs    01/20/22 0659 06/10/22 1837  BNP 143.2* 937.9*   Basic Metabolic Panel: Recent Labs  Lab 06/12/22 1633 06/12/22 1906 06/13/22 0622 06/14/22 0423 06/15/22 0906 06/16/22 0350  NA  --  135  139 138 134* 135  K  --  3.9 4.7 4.4 4.4 5.1  CL  --  100 102 99 97* 99  CO2  --  30 31 28 29 26   GLUCOSE  --  143* 92 93 101* 161*  BUN  --  14 15 15 19  25*  CREATININE  --  1.54* 1.64* 1.63* 1.72* 1.78*  CALCIUM  --  8.5* 9.0 9.1 9.1 9.0  MG 2.0  --  2.3 2.1 2.1 2.2   Liver Function Tests: Recent Labs  Lab 06/12/22 0244 06/16/22 0350  AST 21 33  ALT 9 8  ALKPHOS 35* 38  BILITOT 0.9 0.5  PROT 5.4* 5.8*  ALBUMIN 2.5* 2.7*   No results for input(s): "LIPASE", "AMYLASE" in the last 168 hours.  No results for input(s): "AMMONIA" in the last 168 hours. CBC: Recent Labs  Lab 06/12/22 0244 06/14/22 0423 06/14/22 1854 06/16/22 0350  WBC 9.2 12.7*  --  17.1*  NEUTROABS  --   --   --  16.2*  HGB 9.1* 9.1* 9.0* 8.5*  HCT 31.3* 30.5* 30.3* 29.0*  MCV 90.7 90.0  --  89.0  PLT 278 224  --  225   Cardiac Enzymes: No results for input(s): "CKTOTAL", "CKMB", "CKMBINDEX", "TROPONINI" in the last 168 hours. BNP: Invalid input(s): "POCBNP" CBG: Recent Labs  Lab 06/17/22 1122 06/17/22 1615 06/17/22 2020 06/18/22 0858 06/18/22 1134  GLUCAP 108* 141* 185* 171* 129*   D-Dimer No results for input(s): "DDIMER" in the last 72 hours. Hgb A1c No results for input(s): "HGBA1C" in the last 72 hours. Lipid Profile No results for input(s): "CHOL", "HDL", "LDLCALC", "TRIG", "CHOLHDL", "LDLDIRECT" in the last 72 hours. Thyroid function studies No results for input(s): "TSH", "T4TOTAL", "T3FREE", "THYROIDAB" in the last 72 hours.  Invalid input(s): "FREET3" Anemia work up No results for input(s): "VITAMINB12", "FOLATE", "FERRITIN", "TIBC", "IRON", "RETICCTPCT" in the last 72 hours. Urinalysis    Component Value Date/Time   COLORURINE AMBER (A) 06/11/2022 0423   APPEARANCEUR CLOUDY (A) 06/11/2022 0423   LABSPEC >1.046 (H) 06/11/2022 0423   PHURINE 5.0 06/11/2022 0423   GLUCOSEU NEGATIVE 06/11/2022 0423   HGBUR LARGE (A) 06/11/2022 0423   BILIRUBINUR NEGATIVE 06/11/2022 0423    BILIRUBINUR small (A) 01/26/2016 1119   KETONESUR NEGATIVE 06/11/2022 0423   PROTEINUR 30 (A) 06/11/2022 0423   UROBILINOGEN 0.2 01/26/2016 1119   UROBILINOGEN 0.2 05/07/2014 0905   NITRITE NEGATIVE 06/11/2022 0423   LEUKOCYTESUR SMALL (A) 06/11/2022 0423   Sepsis Labs Recent Labs  Lab 06/12/22 0244 06/14/22 0423 06/16/22 0350  WBC 9.2 12.7* 17.1*   Microbiology Recent Results (from the past 240 hour(s))  Respiratory (~20 pathogens) panel by PCR     Status: None   Collection Time: 06/11/22 12:46 AM   Specimen: Nasopharyngeal Swab; Respiratory  Result Value Ref Range Status   Adenovirus NOT DETECTED NOT DETECTED Final   Coronavirus 229E NOT DETECTED NOT DETECTED Final    Comment: (NOTE) The Coronavirus on the Respiratory Panel, DOES NOT test for the novel  Coronavirus (2019 nCoV)    Coronavirus HKU1 NOT DETECTED NOT DETECTED Final   Coronavirus NL63 NOT DETECTED NOT DETECTED Final   Coronavirus OC43 NOT DETECTED NOT DETECTED Final   Metapneumovirus NOT DETECTED NOT DETECTED Final   Rhinovirus / Enterovirus NOT DETECTED NOT DETECTED Final   Influenza A NOT DETECTED NOT DETECTED Final   Influenza B NOT DETECTED NOT DETECTED Final   Parainfluenza Virus 1 NOT DETECTED NOT DETECTED Final   Parainfluenza Virus 2 NOT DETECTED NOT DETECTED Final   Parainfluenza Virus 3 NOT DETECTED NOT DETECTED Final   Parainfluenza Virus 4 NOT DETECTED NOT DETECTED Final   Respiratory Syncytial Virus NOT DETECTED NOT DETECTED Final   Bordetella pertussis NOT DETECTED NOT DETECTED Final   Bordetella Parapertussis NOT DETECTED NOT DETECTED Final   Chlamydophila pneumoniae NOT DETECTED NOT DETECTED Final   Mycoplasma pneumoniae NOT DETECTED NOT DETECTED Final    Comment: Performed at [facility], [place]. [address], [place], [place] [address]     Time coordinating discharge: 35 minutes  SIGNED:   Aline August, MD  [facility] 06/18/2022, 2:36 PM

## 2022-06-19 LAB — BASIC METABOLIC PANEL
Anion gap: 15 (ref 5–15)
BUN: 34 mg/dL — ABNORMAL HIGH (ref 8–23)
CO2: 23 mmol/L (ref 22–32)
Calcium: 9.6 mg/dL (ref 8.9–10.3)
Chloride: 100 mmol/L (ref 98–111)
Creatinine, Ser: 1.66 mg/dL — ABNORMAL HIGH (ref 0.61–1.24)
GFR, Estimated: 42 mL/min — ABNORMAL LOW (ref >60)
Glucose, Bld: 127 mg/dL — ABNORMAL HIGH (ref 70–99)
Potassium: 4.5 mmol/L (ref 3.5–5.1)
Sodium: 138 mmol/L (ref 135–145)

## 2022-06-19 LAB — MAGNESIUM: Magnesium: 2.2 mg/dL (ref 1.7–2.4)

## 2022-06-19 LAB — GLUCOSE, CAPILLARY: Glucose-Capillary: 116 mg/dL — ABNORMAL HIGH (ref 70–99)

## 2022-06-19 NOTE — Care Management Important Message (Signed)
Important Message  Patient Details  Name: CARRY ORTEZ MRN: [id number] Date of Birth: July 04, 1941   Medicare Important Message Given:  Yes     [name] 06/19/2022, 9:48 AM

## 2022-06-19 NOTE — Progress Notes (Signed)
Patient is waiting to be discharged home with hospice once arrangements have been made.  Patient seen and examined at bedside.  Please refer to the full discharge summary done by me on 06/18/2022 for full details.  Overall prognosis is poor.

## 2022-06-19 NOTE — TOC Transition Note (Signed)
Transition of Care [facility]) - CM/SW Discharge Note   Patient Details  Name: Jeffrey Stephens MRN: [id number] Date of Birth: [birth year]  [facility] CM/SW Contact:  Bethena Roys, RN Phone Number: 06/19/2022, 10:17 AM   Clinical Narrative: Case Manager spoke with family in the room regarding plan for Hospice in the Home via [facility]. Family states that all of the equipment has been delivered to the home. Patient has hospital bed and oxygen in the home. Case Manager verified with the agency liaison Margret Chance and [name] has been notified for pickup. [facility] ETA within 40 minutes. Family, Staff RN, and MD aware of plan. No further needs identified at this time.   Final next level of care: Home w Hospice Care Barriers to Discharge: No Barriers Identified   Patient Goals and CMS Choice Patient states their goals for this hospitalization and ongoing recovery are:: returning home with hospice CMS Medicare.gov Compare Post Acute Care list provided to:: Patient Represenative (must comment) Choice offered to / list presented to : Spouse, Adult Children  Discharge Plan and Services   Discharge Planning Services: CM Consult Post Acute Care Choice: Hospice           Readmission Risk Interventions     No data to display

## 2022-06-21 DIAGNOSIS — J449 Chronic obstructive pulmonary disease, unspecified: Secondary | ICD-10-CM | POA: Diagnosis not present

## 2022-06-28 ENCOUNTER — Institutional Professional Consult (permissible substitution): Payer: Medicare PPO | Admitting: Pulmonary Disease

## 2022-07-19 ENCOUNTER — Telehealth: Payer: Self-pay

## 2022-07-19 NOTE — Telephone Encounter (Signed)
LMOVM for patient to give the device clinic a call. His app has not updated since implant.

## 2022-07-21 NOTE — Telephone Encounter (Signed)
Pt is deceased. He died on 07/04/2022 per his online obituary.

## 2022-07-26 DEATH — deceased

## 2022-09-05 ENCOUNTER — Telehealth: Payer: Self-pay | Admitting: Internal Medicine

## 2022-09-05 NOTE — Telephone Encounter (Signed)
Jeffrey Stephens  Jeffrey Stephens grandaughter is my patient wh ILD. She said he died July 23, 2022 after hospitalization. Hospice at home. Chart need to say deceased + FYI    SIGNATURE    Dr. Brand Males, M.D., F.C.C.P,  Pulmonary and Critical Care Medicine Staff Physician, [place] Director - Interstitial Lung Disease  Program  Medical Director - [place] [facility] [place] [facility] [place], [place], [address]   Pager: [phone], If no answer  -[place] or Try [phone] Telephone (clinical office): [phone] Telephone (research): [phone]  4:08 PM 09/05/2022

## 2022-09-05 NOTE — Telephone Encounter (Signed)
Thank you :)
